# Patient Record
Sex: Male | Born: 1960 | Race: White | Hispanic: No | Marital: Single | Smoking: Former smoker
Health system: Southern US, Community
[De-identification: ages and names within clinical notes are randomized; demographics above are authoritative.]

## PROBLEM LIST (undated history)

## (undated) DIAGNOSIS — K7460 Unspecified cirrhosis of liver: Secondary | ICD-10-CM

## (undated) DIAGNOSIS — D696 Thrombocytopenia, unspecified: Secondary | ICD-10-CM

## (undated) DIAGNOSIS — F419 Anxiety disorder, unspecified: Secondary | ICD-10-CM

## (undated) DIAGNOSIS — J189 Pneumonia, unspecified organism: Secondary | ICD-10-CM

## (undated) DIAGNOSIS — B1920 Unspecified viral hepatitis C without hepatic coma: Secondary | ICD-10-CM

## (undated) DIAGNOSIS — K4090 Unilateral inguinal hernia, without obstruction or gangrene, not specified as recurrent: Secondary | ICD-10-CM

## (undated) DIAGNOSIS — F1020 Alcohol dependence, uncomplicated: Secondary | ICD-10-CM

## (undated) DIAGNOSIS — R188 Other ascites: Secondary | ICD-10-CM

## (undated) DIAGNOSIS — D689 Coagulation defect, unspecified: Secondary | ICD-10-CM

---

## 1978-09-03 HISTORY — PX: INCISION AND DRAINAGE PERIRECTAL ABSCESS: SHX1804

## 2001-11-06 ENCOUNTER — Emergency Department (HOSPITAL_COMMUNITY): Admission: EM | Admit: 2001-11-06 | Discharge: 2001-11-06 | Payer: Self-pay | Admitting: Emergency Medicine

## 2001-11-06 ENCOUNTER — Encounter: Payer: Self-pay | Admitting: Emergency Medicine

## 2003-01-07 ENCOUNTER — Emergency Department (HOSPITAL_COMMUNITY): Admission: EM | Admit: 2003-01-07 | Discharge: 2003-01-07 | Payer: Self-pay | Admitting: Emergency Medicine

## 2003-01-12 ENCOUNTER — Emergency Department (HOSPITAL_COMMUNITY): Admission: EM | Admit: 2003-01-12 | Discharge: 2003-01-12 | Payer: Self-pay | Admitting: Emergency Medicine

## 2003-02-24 ENCOUNTER — Emergency Department (HOSPITAL_COMMUNITY): Admission: EM | Admit: 2003-02-24 | Discharge: 2003-02-25 | Payer: Self-pay | Admitting: Emergency Medicine

## 2013-08-02 DIAGNOSIS — K7460 Unspecified cirrhosis of liver: Secondary | ICD-10-CM

## 2013-08-02 HISTORY — PX: INGUINAL HERNIA REPAIR: SUR1180

## 2013-08-02 HISTORY — DX: Unspecified cirrhosis of liver: K74.60

## 2013-08-28 ENCOUNTER — Encounter (HOSPITAL_COMMUNITY): Payer: Self-pay | Admitting: Emergency Medicine

## 2013-08-28 ENCOUNTER — Inpatient Hospital Stay (HOSPITAL_COMMUNITY): Payer: Medicaid - Out of State

## 2013-08-28 ENCOUNTER — Inpatient Hospital Stay (HOSPITAL_COMMUNITY)
Admission: EM | Admit: 2013-08-28 | Discharge: 2013-09-10 | DRG: 871 | Disposition: A | Discharge status: Home / Self Care | Payer: Medicaid - Out of State | Attending: Family Medicine | Admitting: Family Medicine

## 2013-08-28 DIAGNOSIS — Z91038 Other insect allergy status: Secondary | ICD-10-CM

## 2013-08-28 DIAGNOSIS — I519 Heart disease, unspecified: Secondary | ICD-10-CM | POA: Diagnosis present

## 2013-08-28 DIAGNOSIS — F1410 Cocaine abuse, uncomplicated: Secondary | ICD-10-CM | POA: Diagnosis present

## 2013-08-28 DIAGNOSIS — Z79899 Other long term (current) drug therapy: Secondary | ICD-10-CM | POA: Diagnosis not present

## 2013-08-28 DIAGNOSIS — R197 Diarrhea, unspecified: Secondary | ICD-10-CM | POA: Diagnosis present

## 2013-08-28 DIAGNOSIS — D684 Acquired coagulation factor deficiency: Secondary | ICD-10-CM | POA: Diagnosis present

## 2013-08-28 DIAGNOSIS — J90 Pleural effusion, not elsewhere classified: Secondary | ICD-10-CM | POA: Diagnosis present

## 2013-08-28 DIAGNOSIS — D62 Acute posthemorrhagic anemia: Secondary | ICD-10-CM | POA: Diagnosis present

## 2013-08-28 DIAGNOSIS — J9600 Acute respiratory failure, unspecified whether with hypoxia or hypercapnia: Secondary | ICD-10-CM | POA: Diagnosis present

## 2013-08-28 DIAGNOSIS — J189 Pneumonia, unspecified organism: Secondary | ICD-10-CM | POA: Diagnosis present

## 2013-08-28 DIAGNOSIS — A419 Sepsis, unspecified organism: Secondary | ICD-10-CM | POA: Diagnosis present

## 2013-08-28 DIAGNOSIS — L7632 Postprocedural hematoma of skin and subcutaneous tissue following other procedure: Secondary | ICD-10-CM

## 2013-08-28 DIAGNOSIS — K7030 Alcoholic cirrhosis of liver without ascites: Secondary | ICD-10-CM

## 2013-08-28 DIAGNOSIS — E872 Acidosis, unspecified: Secondary | ICD-10-CM | POA: Diagnosis present

## 2013-08-28 DIAGNOSIS — N5089 Other specified disorders of the male genital organs: Secondary | ICD-10-CM | POA: Diagnosis present

## 2013-08-28 DIAGNOSIS — E8779 Other fluid overload: Secondary | ICD-10-CM | POA: Diagnosis present

## 2013-08-28 DIAGNOSIS — E871 Hypo-osmolality and hyponatremia: Secondary | ICD-10-CM | POA: Diagnosis present

## 2013-08-28 DIAGNOSIS — IMO0002 Reserved for concepts with insufficient information to code with codable children: Secondary | ICD-10-CM | POA: Diagnosis present

## 2013-08-28 DIAGNOSIS — Y838 Other surgical procedures as the cause of abnormal reaction of the patient, or of later complication, without mention of misadventure at the time of the procedure: Secondary | ICD-10-CM | POA: Diagnosis present

## 2013-08-28 DIAGNOSIS — D6959 Other secondary thrombocytopenia: Secondary | ICD-10-CM | POA: Diagnosis present

## 2013-08-28 DIAGNOSIS — I498 Other specified cardiac arrhythmias: Secondary | ICD-10-CM | POA: Diagnosis not present

## 2013-08-28 DIAGNOSIS — K7290 Hepatic failure, unspecified without coma: Secondary | ICD-10-CM

## 2013-08-28 DIAGNOSIS — F10239 Alcohol dependence with withdrawal, unspecified: Secondary | ICD-10-CM | POA: Diagnosis present

## 2013-08-28 DIAGNOSIS — R6520 Severe sepsis without septic shock: Secondary | ICD-10-CM | POA: Diagnosis present

## 2013-08-28 DIAGNOSIS — E43 Unspecified severe protein-calorie malnutrition: Secondary | ICD-10-CM | POA: Diagnosis present

## 2013-08-28 DIAGNOSIS — F17200 Nicotine dependence, unspecified, uncomplicated: Secondary | ICD-10-CM | POA: Diagnosis present

## 2013-08-28 DIAGNOSIS — D689 Coagulation defect, unspecified: Secondary | ICD-10-CM | POA: Diagnosis present

## 2013-08-28 DIAGNOSIS — K7682 Hepatic encephalopathy: Secondary | ICD-10-CM | POA: Diagnosis present

## 2013-08-28 DIAGNOSIS — D72829 Elevated white blood cell count, unspecified: Secondary | ICD-10-CM | POA: Diagnosis present

## 2013-08-28 DIAGNOSIS — R64 Cachexia: Secondary | ICD-10-CM | POA: Diagnosis present

## 2013-08-28 DIAGNOSIS — Z6827 Body mass index (BMI) 27.0-27.9, adult: Secondary | ICD-10-CM | POA: Diagnosis not present

## 2013-08-28 DIAGNOSIS — F1020 Alcohol dependence, uncomplicated: Secondary | ICD-10-CM | POA: Diagnosis present

## 2013-08-28 DIAGNOSIS — R112 Nausea with vomiting, unspecified: Secondary | ICD-10-CM | POA: Diagnosis present

## 2013-08-28 DIAGNOSIS — B182 Chronic viral hepatitis C: Secondary | ICD-10-CM

## 2013-08-28 DIAGNOSIS — R188 Other ascites: Secondary | ICD-10-CM | POA: Diagnosis present

## 2013-08-28 DIAGNOSIS — T819XXA Unspecified complication of procedure, initial encounter: Secondary | ICD-10-CM | POA: Diagnosis present

## 2013-08-28 DIAGNOSIS — K7460 Unspecified cirrhosis of liver: Secondary | ICD-10-CM | POA: Diagnosis present

## 2013-08-28 DIAGNOSIS — F10939 Alcohol use, unspecified with withdrawal, unspecified: Secondary | ICD-10-CM | POA: Diagnosis present

## 2013-08-28 DIAGNOSIS — K7469 Other cirrhosis of liver: Secondary | ICD-10-CM

## 2013-08-28 DIAGNOSIS — D696 Thrombocytopenia, unspecified: Secondary | ICD-10-CM | POA: Diagnosis present

## 2013-08-28 DIAGNOSIS — T148XXA Other injury of unspecified body region, initial encounter: Secondary | ICD-10-CM | POA: Diagnosis present

## 2013-08-28 HISTORY — DX: Unilateral inguinal hernia, without obstruction or gangrene, not specified as recurrent: K40.90

## 2013-08-28 HISTORY — DX: Unspecified viral hepatitis C without hepatic coma: B19.20

## 2013-08-28 LAB — CBC WITH DIFFERENTIAL/PLATELET
BASOS ABS: 0 10*3/uL (ref 0.0–0.1)
BASOS PCT: 0 % (ref 0–1)
EOS ABS: 0 10*3/uL (ref 0.0–0.7)
Eosinophils Relative: 0 % (ref 0–5)
HEMATOCRIT: 30.7 % — AB (ref 39.0–52.0)
Hemoglobin: 10.7 g/dL — ABNORMAL LOW (ref 13.0–17.0)
LYMPHS PCT: 10 % — AB (ref 12–46)
Lymphs Abs: 2.3 10*3/uL (ref 0.7–4.0)
MCH: 34.2 pg — AB (ref 26.0–34.0)
MCHC: 34.9 g/dL (ref 30.0–36.0)
MCV: 98.1 fL (ref 78.0–100.0)
MONO ABS: 3 10*3/uL — AB (ref 0.1–1.0)
Monocytes Relative: 13 % — ABNORMAL HIGH (ref 3–12)
Neutro Abs: 18.3 10*3/uL — ABNORMAL HIGH (ref 1.7–7.7)
Neutrophils Relative %: 77 % (ref 43–77)
PLATELETS: 139 10*3/uL — AB (ref 150–400)
RBC: 3.13 MIL/uL — ABNORMAL LOW (ref 4.22–5.81)
RDW: 20.9 % — ABNORMAL HIGH (ref 11.5–15.5)
WBC: 23.7 10*3/uL — AB (ref 4.0–10.5)

## 2013-08-28 LAB — DIC (DISSEMINATED INTRAVASCULAR COAGULATION)PANEL
D-Dimer, Quant: 3.45 ug/mL-FEU — ABNORMAL HIGH (ref 0.00–0.48)
Fibrinogen: 189 mg/dL — ABNORMAL LOW (ref 204–475)
Platelets: 93 10*3/uL — ABNORMAL LOW (ref 150–400)
Smear Review: NONE SEEN

## 2013-08-28 LAB — TYPE AND SCREEN
ABO/RH(D): A POS
Antibody Screen: NEGATIVE

## 2013-08-28 LAB — COMPREHENSIVE METABOLIC PANEL
ALBUMIN: 2 g/dL — AB (ref 3.5–5.2)
ALT: 28 U/L (ref 0–53)
AST: 46 U/L — AB (ref 0–37)
Alkaline Phosphatase: 100 U/L (ref 39–117)
Anion gap: 17 — ABNORMAL HIGH (ref 5–15)
BUN: 14 mg/dL (ref 6–23)
CO2: 20 mEq/L (ref 19–32)
CREATININE: 0.76 mg/dL (ref 0.50–1.35)
Calcium: 7.4 mg/dL — ABNORMAL LOW (ref 8.4–10.5)
Chloride: 90 mEq/L — ABNORMAL LOW (ref 96–112)
GFR calc Af Amer: >90 mL/min (ref >90)
GFR calc non Af Amer: >90 mL/min (ref >90)
Glucose, Bld: 88 mg/dL (ref 70–99)
Potassium: 4 mEq/L (ref 3.7–5.3)
Sodium: 127 mEq/L — ABNORMAL LOW (ref 137–147)
TOTAL PROTEIN: 6.9 g/dL (ref 6.0–8.3)
Total Bilirubin: 8.6 mg/dL — ABNORMAL HIGH (ref 0.3–1.2)

## 2013-08-28 LAB — URINALYSIS, ROUTINE W REFLEX MICROSCOPIC
Glucose, UA: NEGATIVE mg/dL
Hgb urine dipstick: NEGATIVE
KETONES UR: NEGATIVE mg/dL
NITRITE: POSITIVE — AB
PH: 5.5 (ref 5.0–8.0)
Protein, ur: NEGATIVE mg/dL
SPECIFIC GRAVITY, URINE: 1.024 (ref 1.005–1.030)
UROBILINOGEN UA: 2 mg/dL — AB (ref 0.0–1.0)

## 2013-08-28 LAB — I-STAT TROPONIN, ED: Troponin i, poc: 0 ng/mL (ref 0.00–0.08)

## 2013-08-28 LAB — LIPASE, BLOOD: LIPASE: 26 U/L (ref 11–59)

## 2013-08-28 LAB — RAPID URINE DRUG SCREEN, HOSP PERFORMED
Amphetamines: NOT DETECTED
BARBITURATES: NOT DETECTED
Benzodiazepines: NOT DETECTED
Cocaine: POSITIVE — AB
Opiates: NOT DETECTED
TETRAHYDROCANNABINOL: NOT DETECTED

## 2013-08-28 LAB — I-STAT CG4 LACTIC ACID, ED: LACTIC ACID, VENOUS: 3.88 mmol/L — AB (ref 0.5–2.2)

## 2013-08-28 LAB — PRO B NATRIURETIC PEPTIDE: Pro B Natriuretic peptide (BNP): 739.8 pg/mL — ABNORMAL HIGH (ref 0–125)

## 2013-08-28 LAB — URINE MICROSCOPIC-ADD ON

## 2013-08-28 LAB — AMMONIA: Ammonia: 60 umol/L (ref 11–60)

## 2013-08-28 LAB — PHOSPHORUS: PHOSPHORUS: 3.1 mg/dL (ref 2.3–4.6)

## 2013-08-28 LAB — DIC (DISSEMINATED INTRAVASCULAR COAGULATION) PANEL
INR: 2.36 — ABNORMAL HIGH (ref 0.00–1.49)
PROTHROMBIN TIME: 25.8 s — AB (ref 11.6–15.2)
aPTT: 35 seconds (ref 24–37)

## 2013-08-28 LAB — ETHANOL: Alcohol, Ethyl (B): <11 mg/dL (ref 0–11)

## 2013-08-28 LAB — ABO/RH: ABO/RH(D): A POS

## 2013-08-28 LAB — MAGNESIUM: Magnesium: 1.7 mg/dL (ref 1.5–2.5)

## 2013-08-28 LAB — PROTIME-INR
INR: 2.16 — ABNORMAL HIGH (ref 0.00–1.49)
Prothrombin Time: 24.1 seconds — ABNORMAL HIGH (ref 11.6–15.2)

## 2013-08-28 LAB — APTT: aPTT: 35 seconds (ref 24–37)

## 2013-08-28 MED ORDER — OXYCODONE HCL 5 MG PO TABS
5.0000 mg | ORAL_TABLET | ORAL | Status: DC | PRN
  Administered 2013-08-29 – 2013-09-02 (×8): 5 mg via ORAL
  Filled 2013-08-28 (×8): qty 1

## 2013-08-28 MED ORDER — ALUM & MAG HYDROXIDE-SIMETH 200-200-20 MG/5ML PO SUSP
30.0000 mL | Freq: Four times a day (QID) | ORAL | Status: DC | PRN
  Administered 2013-08-30: 30 mL via ORAL
  Filled 2013-08-28: qty 30

## 2013-08-28 MED ORDER — ACETAMINOPHEN 325 MG PO TABS
650.0000 mg | ORAL_TABLET | Freq: Four times a day (QID) | ORAL | Status: DC | PRN
  Administered 2013-08-29: 650 mg via ORAL
  Filled 2013-08-28: qty 2

## 2013-08-28 MED ORDER — DOCUSATE SODIUM 100 MG PO CAPS
100.0000 mg | ORAL_CAPSULE | Freq: Two times a day (BID) | ORAL | Status: DC
  Administered 2013-08-29: 100 mg via ORAL
  Filled 2013-08-28 (×3): qty 1

## 2013-08-28 MED ORDER — PIPERACILLIN-TAZOBACTAM 3.375 G IVPB 30 MIN
3.3750 g | Freq: Once | INTRAVENOUS | Status: AC
  Administered 2013-08-28: 3.375 g via INTRAVENOUS
  Filled 2013-08-28: qty 50

## 2013-08-28 MED ORDER — LORAZEPAM 1 MG PO TABS
1.0000 mg | ORAL_TABLET | Freq: Four times a day (QID) | ORAL | Status: DC | PRN
  Administered 2013-08-29: 1 mg via ORAL
  Filled 2013-08-28: qty 1

## 2013-08-28 MED ORDER — VANCOMYCIN HCL IN DEXTROSE 1-5 GM/200ML-% IV SOLN
1000.0000 mg | Freq: Once | INTRAVENOUS | Status: AC
  Administered 2013-08-28: 1000 mg via INTRAVENOUS
  Filled 2013-08-28: qty 200

## 2013-08-28 MED ORDER — IOHEXOL 300 MG/ML  SOLN
100.0000 mL | Freq: Once | INTRAMUSCULAR | Status: AC | PRN
  Administered 2013-08-28: 100 mL via INTRAVENOUS

## 2013-08-28 MED ORDER — SODIUM CHLORIDE 0.9 % IV SOLN
1000.0000 mL | Freq: Once | INTRAVENOUS | Status: AC
  Administered 2013-08-28: 1000 mL via INTRAVENOUS

## 2013-08-28 MED ORDER — SENNA 8.6 MG PO TABS
1.0000 | ORAL_TABLET | Freq: Two times a day (BID) | ORAL | Status: DC
  Administered 2013-08-29: 8.6 mg via ORAL
  Filled 2013-08-28: qty 1

## 2013-08-28 MED ORDER — PIPERACILLIN-TAZOBACTAM 4.5 G IVPB: 4.5000 g | Freq: Once | INTRAVENOUS | Status: DC

## 2013-08-28 MED ORDER — SODIUM CHLORIDE 0.9 % IV SOLN: 10.0000 mL/h | Freq: Once | INTRAVENOUS | Status: DC

## 2013-08-28 MED ORDER — MORPHINE SULFATE 4 MG/ML IJ SOLN
4.0000 mg | Freq: Once | INTRAMUSCULAR | Status: AC
  Administered 2013-08-28: 4 mg via INTRAVENOUS
  Filled 2013-08-28: qty 1

## 2013-08-28 MED ORDER — LORAZEPAM 2 MG/ML IJ SOLN
0.0000 mg | Freq: Four times a day (QID) | INTRAMUSCULAR | Status: DC
  Administered 2013-08-29 – 2013-08-30 (×5): 1 mg via INTRAVENOUS
  Filled 2013-08-28 (×4): qty 1

## 2013-08-28 MED ORDER — ACETAMINOPHEN 650 MG RE SUPP: 650.0000 mg | Freq: Four times a day (QID) | RECTAL | Status: DC | PRN

## 2013-08-28 MED ORDER — VITAMIN K1 10 MG/ML IJ SOLN
2.0000 mg | Freq: Once | INTRAVENOUS | Status: AC
  Administered 2013-08-28: 2 mg via INTRAVENOUS
  Filled 2013-08-28: qty 0.2

## 2013-08-28 MED ORDER — SODIUM CHLORIDE 0.9 % IV SOLN
INTRAVENOUS | Status: DC
  Administered 2013-08-28 (×2): via INTRAVENOUS

## 2013-08-28 MED ORDER — VITAMIN K1 10 MG/ML IJ SOLN: 2.0000 mg | Freq: Once | INTRAVENOUS | Status: DC

## 2013-08-28 MED ORDER — SODIUM CHLORIDE 0.9 % IV SOLN: Freq: Once | INTRAVENOUS | Status: DC

## 2013-08-28 MED ORDER — FOLIC ACID 1 MG PO TABS
1.0000 mg | ORAL_TABLET | Freq: Every day | ORAL | Status: DC
  Administered 2013-08-29 – 2013-09-10 (×13): 1 mg via ORAL
  Filled 2013-08-28 (×14): qty 1

## 2013-08-28 MED ORDER — THIAMINE HCL 100 MG/ML IJ SOLN
100.0000 mg | Freq: Every day | INTRAMUSCULAR | Status: DC
  Administered 2013-08-28: 100 mg via INTRAVENOUS
  Filled 2013-08-28 (×8): qty 1

## 2013-08-28 MED ORDER — ONDANSETRON HCL 4 MG/2ML IJ SOLN
4.0000 mg | Freq: Once | INTRAMUSCULAR | Status: AC
  Administered 2013-08-28: 4 mg via INTRAVENOUS
  Filled 2013-08-28: qty 2

## 2013-08-28 MED ORDER — ONDANSETRON HCL 4 MG PO TABS: 4.0000 mg | ORAL_TABLET | Freq: Four times a day (QID) | ORAL | Status: DC | PRN

## 2013-08-28 MED ORDER — SODIUM CHLORIDE 0.9 % IV SOLN
INTRAVENOUS | Status: DC
  Administered 2013-08-29: 11:00:00 via INTRAVENOUS

## 2013-08-28 MED ORDER — LORAZEPAM 2 MG/ML IJ SOLN: 0.0000 mg | Freq: Two times a day (BID) | INTRAMUSCULAR | Status: DC

## 2013-08-28 MED ORDER — LORAZEPAM 2 MG/ML IJ SOLN
1.0000 mg | Freq: Four times a day (QID) | INTRAMUSCULAR | Status: DC | PRN
  Administered 2013-08-28 – 2013-08-30 (×2): 1 mg via INTRAVENOUS
  Filled 2013-08-28 (×2): qty 1

## 2013-08-28 MED ORDER — IOHEXOL 300 MG/ML  SOLN
50.0000 mL | Freq: Once | INTRAMUSCULAR | Status: AC | PRN
  Administered 2013-08-28: 50 mL via ORAL

## 2013-08-28 MED ORDER — VITAMIN B-1 100 MG PO TABS
100.0000 mg | ORAL_TABLET | Freq: Every day | ORAL | Status: DC
  Administered 2013-08-29 – 2013-09-10 (×13): 100 mg via ORAL
  Filled 2013-08-28 (×15): qty 1

## 2013-08-28 MED ORDER — ADULT MULTIVITAMIN W/MINERALS CH
1.0000 | ORAL_TABLET | Freq: Every day | ORAL | Status: DC
  Administered 2013-08-29 – 2013-09-10 (×13): 1 via ORAL
  Filled 2013-08-28 (×14): qty 1

## 2013-08-28 MED ORDER — ONDANSETRON HCL 4 MG/2ML IJ SOLN
4.0000 mg | Freq: Four times a day (QID) | INTRAMUSCULAR | Status: DC | PRN
  Administered 2013-08-30: 4 mg via INTRAVENOUS
  Filled 2013-08-28: qty 2

## 2013-08-28 NOTE — ED Notes (Signed)
MD at bedside.  General Surgery

## 2013-08-28 NOTE — ED Notes (Signed)
Pt states that he had an inguinal surgery 1 wk ago in [place].  Pt drove all the way down here by himself. Black and blue bruising noted from mid abd to mid thigh.  Severe swelling noted to groin.  Pt is also jaundiced.  Reports hx of alcoholism.

## 2013-08-28 NOTE — Progress Notes (Signed)
Patient transferred to 1402. Report given to nurse Dawn.

## 2013-08-28 NOTE — Progress Notes (Signed)
Pt arrived from ED after report received from RN. Pt alert and oriented, attempted to contact admitting physician x2 without a callback. PT has telemetry order and admitted to a non-tele unit. Will continue to call MD to clarify orders.

## 2013-08-28 NOTE — H&P (Addendum)
[facility] History and Physical  Xavier Valentine XBJ:[id number] DOB: 1960-09-02 DOA: 08/28/2013   PCP: No PCP Per Patient    Chief Complaint: bleeding from hernia incision  HPI: Xavier Valentine is a 53 y.o. male with past medical h/o of ETOH abuse and Hep C who underwent a right inguinal hernia repair in [place] 1 wk ago. He was doing well post op until 2 days ago when he states the wound began to bleed. It has continued to bleed for these 2 days and he therefore decided to come to the ER. He has no c/o dizziness lightheaded sensation with standing. He is noted to have a significantly elevated bilirubin and an elevated INR.  I do not have records to see what his INR was pre-op but he states that his pre-op lab work was unremarkable.   General: The patient denies anorexia, fever, + weight loss of about 20 lbs since Feb of this year Cardiac: Denies chest pain, syncope, palpitations, + pedal edema  Respiratory: Denies cough, shortness of breath, wheezing GI: Denies severe indigestion/heartburn, abdominal pain, nausea, vomiting, diarrhea and constipation GU: Denies hematuria, incontinence, dysuria  Musculoskeletal: Denies arthritis  Skin: Denies suspicious skin lesions Neurologic: Denies focal weakness or numbness, change in vision  Past Medical History  Diagnosis Date  . Alcoholism   . Inguinal hernia   . Hepatitis C   . Perirectal abscess     Past Surgical History  Procedure Laterality Date  . Hernia repair      Social History: no h/o smoking, has been drinking 2-3 drinks of vodka daily, lives up in [place] but now living with his family in [place]    Allergies  Allergen Reactions  . Bee Venom Swelling    History reviewed. No pertinent family history.    Prior to Admission medications   Medication Sig Start Date End Date Taking? Authorizing Provider  Docusate Calcium (STOOL SOFTENER PO) Take 1 tablet by mouth 2 (two) times daily as needed (constipation.).    Yes Historical Provider, MD  oxycodone (OXY-IR) 5 MG capsule Take 5 mg by mouth every 4 (four) hours as needed for pain.   Yes Historical Provider, MD     Physical Exam: Filed Vitals:   08/28/13 1500 08/28/13 1515 08/28/13 1530 08/28/13 1545  BP: 122/70 119/67 119/71 121/64  Pulse: 85 87 88 88  Temp:      TempSrc:      Resp: SpO2: 93% 92% 94% 92%     General: AAO x 3, no distress HEENT: Normocephalic and Atraumatic, Mucous membranes pink                PERRLA; EOM intact; significant scleral icterus,                 Nares: Patent, Oropharynx:                 Neck: FROM, no cervical lymphadenopathy, thyromegaly, carotid bruit or JVD;  Breasts: deferred CHEST WALL: No tenderness  CHEST: Normal respiration, clear to auscultation bilaterally  HEART: Regular rate and rhythm; no murmurs rubs or gallops  BACK: No kyphosis or scoliosis; no CVA tenderness  ABDOMEN: Positive Bowel Sounds, soft, non-tender; moderate distension-  no masses, no organomegaly Rectal Exam: deferred EXTREMITIES: No cyanosis, clubbing, or edema Genitalia: not examined  SKIN:  no rash or ulceration - severe bruising of abdominal wall and right leg, oozing of blood from right inguinal incision with significant swelling in area  surrounding the incision,  Lower abdominal;/pelvis area and scrotum. Staples intact.  CNS: Alert and Oriented x 4, Nonfocal exam, CN 2-12 intact  Labs on Admission:  Basic Metabolic Panel:  Recent Labs Lab 08/28/13 1357  NA 127*  K 4.0  CL 90*  CO2 20  GLUCOSE 88  BUN 14  CREATININE 0.76  CALCIUM 7.4*  MG 1.7  PHOS 3.1   Liver Function Tests:  Recent Labs Lab 08/28/13 1357  AST 46*  ALT 28  ALKPHOS 100  BILITOT 8.6*  PROT 6.9  ALBUMIN 2.0*    Recent Labs Lab 08/28/13 1357  LIPASE 26    Recent Labs Lab 08/28/13 1357  AMMONIA 60   CBC:  Recent Labs Lab 08/28/13 1357  WBC 23.7*  NEUTROABS 18.3*  HGB 10.7*  HCT 30.7*  MCV 98.1  PLT 139*    Cardiac Enzymes: No results found for this basename: CKTOTAL, CKMB, CKMBINDEX, TROPONINI,  in the last 168 hours  BNP (last 3 results)  Recent Labs  08/28/13 1357  PROBNP 739.8*   CBG: No results found for this basename: GLUCAP,  in the last 168 hours  Radiological Exams on Admission: No results found.  EKG: Independently reviewed. Sinus rhythm with Qtc of 540 msec  Assessment/Plan Principal Problem:  Right inguinal and abdominal Hematoma / Post-operative complication -coagulopathy due to cirrhosis-  given Vit K in ER- will give a dose of Cryoprecipitate and order a DIC panel (discussed with hematology) - per hematology, if one dose of Cryo is ineffective, no need to give further doses.  - difficult to tell if there is a related infection - WBC count - specifically, neutrophils are elevated but no pus seen draining from the wound and no fevers- has been given Vanc and Zosyn in the ER - further management per surgery  Active Problems:  Cirrhosis of liver- Hep C and Alcohol induce  mild thrombocytopenia  possible ascites - with significantly elevated Bilirubin - obviously he needs to stop drinking alcohol and he understands this  Leukocytosis - stress response vs infected hematoma- also appears to have a UTI  - blood cx x 2 and urine culture obtained - cont Vanc and Zosyn   ETOH abuse - follow for withdrawal with CIWA scale.   Hypoalbuminemia - likely due to cirrhosis  Hyponatremia - check urine sodium and osmolality - possibly is fluid overloaded- exam suggestive of ascites - however, in setting of bleeding and elevated Lactic acid which is suggestive of poor perfusion of tissues,  IVF have been started by the ER -  I will at least continue this overnight- follow Bmet in AM   Cocaine positive -avoid B blockers- discussed health complications of Cocaine use with patient.     Consulted: srugery  Code Status: Full code but does not want to be kept alive if he is  a "vegetable"  Family Communication: none  DVT Prophylaxis:SCDs  Time spent: >45 in  [name], MD [facility]  If 7PM-7AM, please contact night-coverage [email or web address] 08/28/2013, 4:35 PM

## 2013-08-28 NOTE — Progress Notes (Signed)
No call from admitting MD, unit charge RN notified, states will call and notify [facility]. Will continue to monitor.

## 2013-08-28 NOTE — Consult Note (Signed)
General Surgery Attending:  I have interviewed and examined this patient. I have discussed his care with the ED physician. I agree with the assessment and treatment plan outlined by Mr. Marlyne Beards PA.  Assessment/plan Wound and scrotal hematoma. One week postop right inguinal hernia repair by history. Details unknown Clinically there is no evidence of infection, but given the leukocytosis I agree with antibiotics.  There is no indication to open the wound at this point in time. In fact, relatively contraindicated. Ice pack CT scan abdomen pelvis and scrotum to make sure this is nothing more than a hematoma. It doesn't look like a recurrence and there is no sign of intestinal obstruction.  Coagulopathy secondary to cirrhosis. INR 2.16. T.Bili 8.6 This has obviously contributed to his wound hemorrhage. We'll give FFP and cryoprecipitate and vitamin K. We will see if this is effective.  Associated hypoalbuminemia, anticipate poor wound healing.  Hepatitis C and cirrhosis by history  Alcohol abuse, active. Risk for withdrawal syndrome  Cocaine use, drug screen positive  Mild lactic acidosis. ?dehydrated a bit.  Elevated BNP   We will follow.  Angelia Mould. Derrell Lolling, M.D., [facility] General and Minimally invasive Surgery Breast and Colorectal Surgery Office:   [phone]

## 2013-08-28 NOTE — ED Notes (Signed)
Dr Donnald Garre aware of elevated I stat Lactic.

## 2013-08-28 NOTE — Consult Note (Signed)
Reason for Consult:  Post op inguinal hernia repair with bleeding, ecchymosis and large hematoma. Referring Physician: Dr. Dorthula Nettles  Xavier Valentine is an 53 y.o. male.  HPI: 53 y/o male who underwent a right inguinal hernia repair at [facility] in [place] Dr. Tresa Garter on 08/21/13.  This was done as an outpatient and he is actually suppose to go back tomorrow for staple removal.  Unfortunately he was about to be homeless so he drove to [place] to live with family and friends.  He reports increasing swelling and ecchymosis since his surgery.  He say a friend brought him to the ED here today and he is living with a sister.  Work up in the ED shows ecchymosis from his abdomen below the costal margin, down thru his perineum, his scrotum, both thighs and legs to below the knees .  He has a stapled inguinal hernia repair draining old blood/hematoma.  He report finding out he has hepatitis C during the work up of his hernia repair.  He drinks ETOH and has recently used cocaine.  He does not know how he got Hepatitis C. Labs show a Na of 127, albumin of 2, bilirubin of 8.6, troponin 0.0, BNP 739.8 Lactic acid of 3.88, WBC of 23.7, H/H 10.7/30.7.  Platelets 139K, INR 2.16.  Probable UTI.  Drug screen positive for cocaine.   We are ask to see to address wound issues.  Past Medical History  Diagnosis Date   Cocaine use   . Alcoholism/ongoing use   . Inguinal hernia   . Hepatitis C   . Perirectal abscess     Past Surgical History  Procedure Laterality Date  . Hernia repair 08/21/13 [place], [place]    History reviewed. No pertinent family history.  Social History:  reports that he has been smoking.  He has never used smokeless tobacco. He reports that he drinks alcohol. He reports that he does not use illicit drugs. Tobacco:  Occasional Drugs:  Cocaine (last week) ETOH:  Heavy use in the past, has had a couple drinks today. Allergies:  Allergies  Allergen Reactions  . Bee Venom  Swelling    Medications:  Prior to Admission:  (Not in a hospital admission) Scheduled:  Continuous: . sodium chloride 100 mL/hr at 08/28/13 1403  . sodium chloride    . sodium chloride    . sodium chloride    . phytonadione (VITAMIN K) IV 2 mg (08/28/13 1613)   PRN: Anti-infectives   Start     Dose/Rate Route Frequency Ordered Stop   08/28/13 1400  piperacillin-tazobactam (ZOSYN) IVPB 4.5 g  Status:  Discontinued     4.5 g 200 mL/hr over 30 Minutes Intravenous  Once 08/28/13 1352 08/28/13 1356   08/28/13 1400  vancomycin (VANCOCIN) IVPB 1000 mg/200 mL premix     1,000 mg 200 mL/hr over 60 Minutes Intravenous  Once 08/28/13 1352 08/28/13 1548   08/28/13 1400  piperacillin-tazobactam (ZOSYN) IVPB 3.375 g     3.375 g 100 mL/hr over 30 Minutes Intravenous  Once 08/28/13 1357 08/28/13 1547      Results for orders placed during the hospital encounter of 08/28/13 (from the past 48 hour(s))  TYPE AND SCREEN     Status: None   Collection Time    08/28/13  1:53 PM      Result Value Ref Range   ABO/RH(D) A POS     Antibody Screen PENDING     Sample Expiration 08/31/2013    COMPREHENSIVE  METABOLIC PANEL     Status: Abnormal   Collection Time    08/28/13  1:57 PM      Result Value Ref Range   Sodium 127 (*) 137 - 147 mEq/L   Potassium 4.0  3.7 - 5.3 mEq/L   Chloride 90 (*) 96 - 112 mEq/L   CO2 20  19 - 32 mEq/L   Glucose, Bld 88  70 - 99 mg/dL   BUN 14  6 - 23 mg/dL   Creatinine, Ser 0.76  0.50 - 1.35 mg/dL   Calcium 7.4 (*) 8.4 - 10.5 mg/dL   Total Protein 6.9  6.0 - 8.3 g/dL   Albumin 2.0 (*) 3.5 - 5.2 g/dL   AST 46 (*) 0 - 37 U/L   ALT 28  0 - 53 U/L   Alkaline Phosphatase 100  39 - 117 U/L   Total Bilirubin 8.6 (*) 0.3 - 1.2 mg/dL   GFR calc non Af Amer >90  >90 mL/min   GFR calc Af Amer >90  >90 mL/min   Comment: (NOTE)     The eGFR has been calculated using the CKD EPI equation.     This calculation has not been validated in all clinical situations.     eGFR's  persistently <90 mL/min signify possible Chronic Kidney     Disease.   Anion gap 17 (*) 5 - 15  LIPASE, BLOOD     Status: None   Collection Time    08/28/13  1:57 PM      Result Value Ref Range   Lipase 26  11 - 59 U/L  PRO B NATRIURETIC PEPTIDE     Status: Abnormal   Collection Time    08/28/13  1:57 PM      Result Value Ref Range   Pro B Natriuretic peptide (BNP) 739.8 (*) 0 - 125 pg/mL  APTT     Status: None   Collection Time    08/28/13  1:57 PM      Result Value Ref Range   aPTT 35  24 - 37 seconds  PROTIME-INR     Status: Abnormal   Collection Time    08/28/13  1:57 PM      Result Value Ref Range   Prothrombin Time 24.1 (*) 11.6 - 15.2 seconds   INR 2.16 (*) 0.00 - 1.49  PHOSPHORUS     Status: None   Collection Time    08/28/13  1:57 PM      Result Value Ref Range   Phosphorus 3.1  2.3 - 4.6 mg/dL  MAGNESIUM     Status: None   Collection Time    08/28/13  1:57 PM      Result Value Ref Range   Magnesium 1.7  1.5 - 2.5 mg/dL  AMMONIA     Status: None   Collection Time    08/28/13  1:57 PM      Result Value Ref Range   Ammonia 60  11 - 60 umol/L  ETHANOL     Status: None   Collection Time    08/28/13  1:57 PM      Result Value Ref Range   Alcohol, Ethyl (B) <11  0 - 11 mg/dL   Comment:            LOWEST DETECTABLE LIMIT FOR     SERUM ALCOHOL IS 11 mg/dL     FOR MEDICAL PURPOSES ONLY  CBC WITH DIFFERENTIAL     Status: Abnormal  Collection Time    08/28/13  1:57 PM      Result Value Ref Range   WBC 23.7 (*) 4.0 - 10.5 K/uL   RBC 3.13 (*) 4.22 - 5.81 MIL/uL   Hemoglobin 10.7 (*) 13.0 - 17.0 g/dL   HCT 30.7 (*) 39.0 - 52.0 %   MCV 98.1  78.0 - 100.0 fL   MCH 34.2 (*) 26.0 - 34.0 pg   MCHC 34.9  30.0 - 36.0 g/dL   RDW 20.9 (*) 11.5 - 15.5 %   Platelets 139 (*) 150 - 400 K/uL   Neutrophils Relative % 77  43 - 77 %   Neutro Abs 18.3 (*) 1.7 - 7.7 K/uL   Lymphocytes Relative 10 (*) 12 - 46 %   Lymphs Abs 2.3  0.7 - 4.0 K/uL   Monocytes Relative 13 (*) 3 -  12 %   Monocytes Absolute 3.0 (*) 0.1 - 1.0 K/uL   Eosinophils Relative 0  0 - 5 %   Eosinophils Absolute 0.0  0.0 - 0.7 K/uL   Basophils Relative 0  0 - 1 %   Basophils Absolute 0.0  0.0 - 0.1 K/uL  I-STAT TROPOININ, ED     Status: None   Collection Time    08/28/13  2:26 PM      Result Value Ref Range   Troponin i, poc 0.00  0.00 - 0.08 ng/mL   Comment 3            Comment: Due to the release kinetics of cTnI,     a negative result within the first hours     of the onset of symptoms does not rule out     myocardial infarction with certainty.     If myocardial infarction is still suspected,     repeat the test at appropriate intervals.  I-STAT CG4 LACTIC ACID, ED     Status: Abnormal   Collection Time    08/28/13  2:27 PM      Result Value Ref Range   Lactic Acid, Venous 3.88 (*) 0.5 - 2.2 mmol/L  URINE RAPID DRUG SCREEN (HOSP PERFORMED)     Status: Abnormal   Collection Time    08/28/13  3:41 PM      Result Value Ref Range   Opiates NONE DETECTED  NONE DETECTED   Cocaine POSITIVE (*) NONE DETECTED   Benzodiazepines NONE DETECTED  NONE DETECTED   Amphetamines NONE DETECTED  NONE DETECTED   Tetrahydrocannabinol NONE DETECTED  NONE DETECTED   Barbiturates NONE DETECTED  NONE DETECTED   Comment:            DRUG SCREEN FOR MEDICAL PURPOSES     ONLY.  IF CONFIRMATION IS NEEDED     FOR ANY PURPOSE, NOTIFY LAB     WITHIN 5 DAYS.                LOWEST DETECTABLE LIMITS     FOR URINE DRUG SCREEN     Drug Class       Cutoff (ng/mL)     Amphetamine      1000     Barbiturate      200     Benzodiazepine   462     Tricyclics       863     Opiates          300     Cocaine          300  THC              50    No results found.  Review of Systems  Constitutional: Negative.   Eyes: Negative.   Respiratory: Negative.   Cardiovascular: Positive for leg swelling (since surgery).  Gastrointestinal: Positive for constipation (since surgery 08/21/13). Negative for heartburn,  nausea, vomiting, abdominal pain and diarrhea.  Genitourinary: Negative.        Urine is very dark  Musculoskeletal: Negative.   Skin:       He started having ecchymosis day after the surgery.  See picture below  Neurological: Positive for dizziness and headaches. Negative for tingling, tremors, speech change, focal weakness, seizures and loss of consciousness.  Endo/Heme/Allergies: Bruises/bleeds easily.  Psychiatric/Behavioral: The patient is nervous/anxious.    Blood pressure 121/64, pulse 88, temperature 98.5 F (36.9 C), temperature source Oral, resp. rate 27, SpO2 92.00%. Physical Exam  Constitutional: He is oriented to person, place, and time. No distress.  Jaundice appearing WM, in some discomfort, with blood all over lower abdomen  HENT:  Head: Normocephalic and atraumatic.  Nose: Nose normal.  Eyes:  Both pupils small and equal he just had pain medicine  Neck: Normal range of motion. Neck supple. No JVD present. No tracheal deviation present. No thyromegaly present.  Cardiovascular: Regular rhythm and intact distal pulses.   Murmur (soft systolic mumur) heard. Slightly tachycardic  Respiratory: Effort normal and breath sounds normal. No respiratory distress. He has no wheezes. He has no rales. He exhibits no tenderness.  GI: Soft. Bowel sounds are normal. He exhibits no distension and no mass. There is no tenderness. There is no rebound and no guarding.  He has significant ecchymosis lower abdomen, perineium, both thighs and down both legs below the knee going into the calves. He has a right groin incision that is stapled together and when pressed is draining old hematoma.  No purulent drainage.  Genitourinary:  Some swelling of the penis perineium is swollen and ecchymotic. The scrotum is slightly larger than a coconut, at least 40 cm in diameter.  Musculoskeletal: He exhibits no edema and no tenderness.  Lymphadenopathy:    He has no cervical adenopathy.  Neurological:  He is alert and oriented to person, place, and time. No cranial nerve deficit.  Skin: He is not diaphoretic.  See picture below  Psychiatric: He has a normal mood and affect. His behavior is normal. Judgment and thought content normal.  Very talkative, with explanations for everything.       Assessment/Plan: 1.  Hepatitis C with probable cirrhosis 2.  Coagulopathy secondary to Cirrhosis 3.  Significant post op bleed into the abdomen, perineum, scrotum and lower legs' after right  inguinal hernia repair. Large hematoma right inguinal hernia repair site. 4.  Possible UTI 5.  Alcoholism/ongoing use 6.  Cocaine use/durg screen positive  Plan:  He has been seen and examined by Dr. Dalbert Batman.  It is his opinion we should start some antibiotics and agrees with vancomycin and Zosyn.  CT of the abdomen and pelvis to include the scrotum with contrast.  He has already received Vitamin K and recommends FFP to help correct the INR.  Ice to the scrotum/ along with support.  We will follow with you  Xavier Valentine 08/28/2013, 4:11 PM

## 2013-08-28 NOTE — ED Provider Notes (Signed)
CSN: [id number]     Arrival date & time 08/28/13  1321 History   First MD Initiated Contact with Patient 08/28/13 1330     Chief Complaint  Patient presents with  . Post-op Problem     (Consider location/radiation/quality/duration/timing/severity/associated sxs/prior Treatment) HPI Is a 53 year old male new to this area. Patient had a surgery done in [place] 7 days ago that was an inguinal hernia repair the patient reports that the hernia he had at that point size was approximately like a coconut in his testicle. Within 2 days of the surgery he had developed extensive bruising around the area. At this point in time that has extended and has a significant amount of bruising that extends from his mid abdomen down to his lower thighs. The patient reports his primary concern is that he has noticed significant swelling redness and foul smelling discharge from the wound site. As well he has developed significant swelling in the scrotal sac and the penis. Patient reports he has had some chills however no documented fever that is where it he does have significant pain and discomfort in association with this. His medical history is significant for a treated hepatitis C and history of significant alcoholism with likely associated cirrhosis. Patient denies any take any regular medications. Denies history of diabetes or other known medical problems.  History reviewed. No pertinent past medical history. History reviewed. No pertinent past surgical history. History reviewed. No pertinent family history. History  Substance Use Topics  . Smoking status: Not on file  . Smokeless tobacco: Not on file  . Alcohol Use: Not on file   patient does describe himself as a chronic alcoholic. He reports he has not been drinking much lately due to his medical conditions. He reports he can quit when he wants to and does not have issues with withdrawal.  Patient reports a history of intermittent tobacco use but apparently  not chronic.  The patient has been living in [place] for a number of years apparently and had a surgery done there as described however is coming back to this area reportedly to be near family as he is recuperating.  Review of Systems  Constitutional: Positive for chills and fatigue. Negative for fever.  HENT: Negative for congestion and sore throat.   Eyes: Negative.   Respiratory: Negative for cough and shortness of breath.   Cardiovascular: Negative for chest pain and leg swelling.  Gastrointestinal: Positive for nausea and abdominal pain. Negative for vomiting and diarrhea.  Genitourinary: Positive for penile swelling, scrotal swelling, difficulty urinating, penile pain and testicular pain. Negative for dysuria.  Musculoskeletal: Positive for myalgias. Negative for arthralgias.  Skin: Positive for color change and wound. Negative for rash.  Neurological: Negative for dizziness, weakness and headaches.  Hematological: Bruises/bleeds easily.  Psychiatric/Behavioral: Negative for confusion and agitation.      Allergies  Review of patient's allergies indicates not on file.  Home Medications   Prior to Admission medications   Not on File   BP 135/89  Pulse 94  Temp(Src) 98.5 F (36.9 C) (Oral)  Resp 18  SpO2 100% Physical Exam The patient is alert and ambulatory into the emergency department his general color is poor being significantly jaundiced. He does appear to have cachexia. He is however nontoxic with clear mental status Head; the patient is normocephalic, atraumatic Eyes: Icteric sclera extraocular motions intact PERRL Mouth: Membranes are slightly dry in appearance tongue appears to have some white plaque  Neck: Supple Lungs: patient has adequate air  flow bilaterally, diminished at the left base. Mild Rales at the bases. No associated respiratory distress. Heart: Mild tachycardia heart sounds are regular Abdomen: Abdomen is distended soft. Patient has deep  ecchymoses from the mid upper abdomen down throughout the entirety of the groin and to the upper thighs. The groin shows a fresh surgical incision in the inguinal area on the right. Staples are in place there is a significant amount of erythema and swelling in this region. The scrotum is significantly distended to approximately the size of a very large grapefruit the head of the penis is ecchymotic there is no phimosis or paraphimosis present. Lower extremities: Aside from the ecchymosis present to the lower joints are otherwise normal condition without peripheral edema. Skin: Skin is warm dry and moderately jaundiced Neurologic: Patient is alert and oriented x3 interactive appropriately follows all commands can ambulate.    ED Course  Procedures (including critical care time) Labs Review Labs Reviewed  COMPREHENSIVE METABOLIC PANEL - Abnormal; Notable for the following:    Sodium 127 (*)    Chloride 90 (*)    Calcium 7.4 (*)    Albumin 2.0 (*)    AST 46 (*)    Total Bilirubin 8.6 (*)    Anion gap 17 (*)    All other components within normal limits  PRO B NATRIURETIC PEPTIDE - Abnormal; Notable for the following:    Pro B Natriuretic peptide (BNP) 739.8 (*)    All other components within normal limits  PROTIME-INR - Abnormal; Notable for the following:    Prothrombin Time 24.1 (*)    INR 2.16 (*)    All other components within normal limits  CBC WITH DIFFERENTIAL - Abnormal; Notable for the following:    WBC 23.7 (*)    RBC 3.13 (*)    Hemoglobin 10.7 (*)    HCT 30.7 (*)    MCH 34.2 (*)    RDW 20.9 (*)    Platelets 139 (*)    Neutro Abs 18.3 (*)    Lymphocytes Relative 10 (*)    Monocytes Relative 13 (*)    Monocytes Absolute 3.0 (*)    All other components within normal limits  I-STAT CG4 LACTIC ACID, ED - Abnormal; Notable for the following:    Lactic Acid, Venous 3.88 (*)    All other components within normal limits  URINE CULTURE  CULTURE, BLOOD (ROUTINE X 2)   CULTURE, BLOOD (ROUTINE X 2)  LIPASE, BLOOD  APTT  PHOSPHORUS  MAGNESIUM  AMMONIA  ETHANOL  URINALYSIS, ROUTINE W REFLEX MICROSCOPIC  URINE RAPID DRUG SCREEN (HOSP PERFORMED)  I-STAT TROPOININ, ED  TYPE AND SCREEN  ABO/RH  PREPARE FRESH FROZEN PLASMA    Imaging Review No results found.   EKG Interpretation None     CONSULT:INGRAM advised to initiated FFP 2units and Vit K  IV. Consult hospitalist for medical management. Decision for admitting service will be determined by surgery and medicine.  INGRAM to the emergency department and assessed the patient he determined at that point time that admission was appropriate to the hospitalist service. MDM   Final diagnoses:  Alcoholic cirrhosis of liver without ascites  Chronic hepatitis C without hepatic coma  Postoperative hematoma of skin following non-dermatologic procedure   Patient is admitted for further treatment with antibiotics and observation.    Arby Barrette, MD 08/28/13 [id number]

## 2013-08-29 DIAGNOSIS — D62 Acute posthemorrhagic anemia: Secondary | ICD-10-CM

## 2013-08-29 DIAGNOSIS — J189 Pneumonia, unspecified organism: Secondary | ICD-10-CM | POA: Diagnosis present

## 2013-08-29 DIAGNOSIS — E871 Hypo-osmolality and hyponatremia: Secondary | ICD-10-CM | POA: Diagnosis present

## 2013-08-29 DIAGNOSIS — A419 Sepsis, unspecified organism: Secondary | ICD-10-CM | POA: Diagnosis present

## 2013-08-29 DIAGNOSIS — K7030 Alcoholic cirrhosis of liver without ascites: Secondary | ICD-10-CM

## 2013-08-29 DIAGNOSIS — D72829 Elevated white blood cell count, unspecified: Secondary | ICD-10-CM | POA: Diagnosis present

## 2013-08-29 DIAGNOSIS — R6520 Severe sepsis without septic shock: Secondary | ICD-10-CM | POA: Diagnosis present

## 2013-08-29 DIAGNOSIS — K7290 Hepatic failure, unspecified without coma: Secondary | ICD-10-CM | POA: Diagnosis present

## 2013-08-29 DIAGNOSIS — D696 Thrombocytopenia, unspecified: Secondary | ICD-10-CM | POA: Diagnosis present

## 2013-08-29 DIAGNOSIS — K7682 Hepatic encephalopathy: Secondary | ICD-10-CM | POA: Diagnosis present

## 2013-08-29 LAB — COMPREHENSIVE METABOLIC PANEL
ALBUMIN: 1.8 g/dL — AB (ref 3.5–5.2)
ALT: 21 U/L (ref 0–53)
ANION GAP: 10 (ref 5–15)
AST: 33 U/L (ref 0–37)
Alkaline Phosphatase: 77 U/L (ref 39–117)
BUN: 16 mg/dL (ref 6–23)
CO2: 22 mEq/L (ref 19–32)
CREATININE: 0.77 mg/dL (ref 0.50–1.35)
Calcium: 6.8 mg/dL — ABNORMAL LOW (ref 8.4–10.5)
Chloride: 95 mEq/L — ABNORMAL LOW (ref 96–112)
GFR calc non Af Amer: >90 mL/min (ref >90)
Glucose, Bld: 95 mg/dL (ref 70–99)
Potassium: 3.9 mEq/L (ref 3.7–5.3)
Sodium: 127 mEq/L — ABNORMAL LOW (ref 137–147)
TOTAL PROTEIN: 5.9 g/dL — AB (ref 6.0–8.3)
Total Bilirubin: 7.8 mg/dL — ABNORMAL HIGH (ref 0.3–1.2)

## 2013-08-29 LAB — RAPID URINE DRUG SCREEN, HOSP PERFORMED
Amphetamines: NOT DETECTED
Barbiturates: NOT DETECTED
Benzodiazepines: NOT DETECTED
COCAINE: POSITIVE — AB
OPIATES: POSITIVE — AB
Tetrahydrocannabinol: NOT DETECTED

## 2013-08-29 LAB — CBC WITH DIFFERENTIAL/PLATELET
BASOS PCT: 0 % (ref 0–1)
Basophils Absolute: 0 10*3/uL (ref 0.0–0.1)
EOS PCT: 0 % (ref 0–5)
Eosinophils Absolute: 0 10*3/uL (ref 0.0–0.7)
HCT: 25.2 % — ABNORMAL LOW (ref 39.0–52.0)
HEMOGLOBIN: 8.8 g/dL — AB (ref 13.0–17.0)
LYMPHS PCT: 12 % (ref 12–46)
Lymphs Abs: 1.5 10*3/uL (ref 0.7–4.0)
MCH: 34 pg (ref 26.0–34.0)
MCHC: 34.9 g/dL (ref 30.0–36.0)
MCV: 97.3 fL (ref 78.0–100.0)
MONOS PCT: 15 % — AB (ref 3–12)
Monocytes Absolute: 1.8 10*3/uL — ABNORMAL HIGH (ref 0.1–1.0)
NEUTROS PCT: 73 % (ref 43–77)
Neutro Abs: 8.9 10*3/uL — ABNORMAL HIGH (ref 1.7–7.7)
Platelets: 77 10*3/uL — ABNORMAL LOW (ref 150–400)
RBC: 2.59 MIL/uL — AB (ref 4.22–5.81)
RDW: 20.8 % — ABNORMAL HIGH (ref 11.5–15.5)
WBC: 12.2 10*3/uL — AB (ref 4.0–10.5)

## 2013-08-29 LAB — PROTIME-INR
INR: 2.14 — AB (ref 0.00–1.49)
Prothrombin Time: 23.9 seconds — ABNORMAL HIGH (ref 11.6–15.2)

## 2013-08-29 LAB — SODIUM, URINE, RANDOM: SODIUM UR: 9 meq/L

## 2013-08-29 LAB — CBC
HCT: 25.7 % — ABNORMAL LOW (ref 39.0–52.0)
HEMOGLOBIN: 8.7 g/dL — AB (ref 13.0–17.0)
MCH: 34 pg (ref 26.0–34.0)
MCHC: 33.9 g/dL (ref 30.0–36.0)
MCV: 100.4 fL — ABNORMAL HIGH (ref 78.0–100.0)
Platelets: 70 10*3/uL — ABNORMAL LOW (ref 150–400)
RBC: 2.56 MIL/uL — ABNORMAL LOW (ref 4.22–5.81)
RDW: 20.4 % — ABNORMAL HIGH (ref 11.5–15.5)
WBC: 8.2 10*3/uL (ref 4.0–10.5)

## 2013-08-29 LAB — LACTIC ACID, PLASMA: Lactic Acid, Venous: 1.9 mmol/L (ref 0.5–2.2)

## 2013-08-29 LAB — OSMOLALITY: Osmolality: 264 mOsm/kg — ABNORMAL LOW (ref 275–300)

## 2013-08-29 LAB — MRSA PCR SCREENING: MRSA by PCR: POSITIVE — AB

## 2013-08-29 LAB — OSMOLALITY, URINE: OSMOLALITY UR: 737 mosm/kg (ref 390–1090)

## 2013-08-29 MED ORDER — CHLORHEXIDINE GLUCONATE CLOTH 2 % EX PADS: 6.0000 | MEDICATED_PAD | Freq: Every day | CUTANEOUS | Status: DC

## 2013-08-29 MED ORDER — LACTULOSE 10 GM/15ML PO SOLN
30.0000 g | Freq: Every day | ORAL | Status: DC
  Administered 2013-08-29: 30 g via ORAL
  Filled 2013-08-29: qty 45

## 2013-08-29 MED ORDER — VANCOMYCIN HCL IN DEXTROSE 1-5 GM/200ML-% IV SOLN
1000.0000 mg | Freq: Three times a day (TID) | INTRAVENOUS | Status: DC
  Administered 2013-08-29 – 2013-09-04 (×19): 1000 mg via INTRAVENOUS
  Filled 2013-08-29 (×20): qty 200

## 2013-08-29 MED ORDER — PIPERACILLIN-TAZOBACTAM 3.375 G IVPB
3.3750 g | Freq: Three times a day (TID) | INTRAVENOUS | Status: DC
  Administered 2013-08-29 – 2013-09-05 (×22): 3.375 g via INTRAVENOUS
  Filled 2013-08-29 (×23): qty 50

## 2013-08-29 MED ORDER — DIPHENHYDRAMINE HCL 50 MG/ML IJ SOLN
25.0000 mg | Freq: Once | INTRAMUSCULAR | Status: AC
  Administered 2013-08-29: 25 mg via INTRAVENOUS
  Filled 2013-08-29: qty 1

## 2013-08-29 MED ORDER — IBUPROFEN 800 MG PO TABS
800.0000 mg | ORAL_TABLET | Freq: Once | ORAL | Status: AC
  Administered 2013-08-29: 800 mg via ORAL
  Filled 2013-08-29: qty 1

## 2013-08-29 MED ORDER — MUPIROCIN 2 % EX OINT
1.0000 "application " | TOPICAL_OINTMENT | Freq: Two times a day (BID) | CUTANEOUS | Status: AC
  Administered 2013-08-29 – 2013-09-03 (×10): 1 via NASAL
  Filled 2013-08-29: qty 22

## 2013-08-29 MED ORDER — ENSURE COMPLETE PO LIQD: 237.0000 mL | Freq: Two times a day (BID) | ORAL | Status: DC

## 2013-08-29 MED ORDER — ENSURE COMPLETE PO LIQD
237.0000 mL | Freq: Three times a day (TID) | ORAL | Status: DC
  Administered 2013-08-29 – 2013-09-03 (×6): 237 mL via ORAL

## 2013-08-29 NOTE — Progress Notes (Signed)
Clinical Social Work Department BRIEF PSYCHOSOCIAL ASSESSMENT 08/29/2013  Patient:  Xavier Valentine, Xavier Valentine     Account Number:  [id number]     Admit date:  08/28/2013  Clinical Social Worker:  Earlie Server  Date/Time:  08/29/2013 09:30 AM  Referred by:  Physician  Date Referred:  08/29/2013 Referred for  Substance Abuse   Other Referral:   Interview type:  Patient Other interview type:    PSYCHOSOCIAL DATA Living Status:  FAMILY Admitted from facility:   Level of care:   Primary support name:  Lattie Haw Primary support relationship to patient:  FAMILY Degree of support available:   Adequate    CURRENT CONCERNS Current Concerns  Substance Abuse   Other Concerns:    SOCIAL WORK ASSESSMENT / PLAN CSW received referral in order to complete psychosocial assessment. CSW reviewed chart and met with patient at bedside. CSW introduced myself and explained role.    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 Patient states he was very proud of his business and people all over the [place] would request him because he did such a great job. Patient developed a hernia and tried to claim injuries under workman's comp but was denied. Patient states he eventually qualified for Medicaid and was able to have surgery about 1 week ago. Patient reports that after surgery he realized he needed additional help so he planned to come to [place] where he his family lives for additional help. Patient reports the drive was harder than he thought it would be and had to come to the hospital because of complications.    Patient is not married and does not have children. Patient reports he moves around often and is "not tied down." Patient plans to recover in [place] but then will move to [place]. Patient reports that he does not need any assistance at DC because his family will assist with whatever he needs.    CSW spoke with patient about his emotional wellbeing  and his ability to adjust to move. Patient denies any history of depression or anxiety and has not had any follow up in the past. Patient reports he has been drinking alcohol for the past 20 years. Patient started drinking socially and reports he drinks about 2 drinks daily. Patient enjoys mixed drinks and wine. Patient states that after a stressful day at work it was nice to come home and have a few drinks. Patient states that he does use alcohol as a coping mechanism but is aware that it is not helpful to drink alcohol while his body is trying to heal. Patient reports he will stop drinking altogether when he leaves the hospital so that he can heal. Patient reports that he does not feel addicted to alcohol and has never received any treatment in the past. CSW suggested outpatient follow up for substance use to help develop positive coping skills. Patient politely declined any further follow up and reports that since he is with his family he will not feel as stressed. CSW also inquired about any drug use and patient states he occasionally uses cocaine 1 a week because it would give him more energy and he was able to accomplish more. Patient states since he is not working and does not need to work long hours then he will not use cocaine. Patient denies any further substance or drug use.    Patient reports he has no CSW needs and will follow up with MD appointments at  DC. CSW is signing off but available if needed.   Assessment/plan status:  No Further Intervention Required Other assessment/ plan:   SBIRT   Information/referral to community resources:   Patient declines any substance abuse treatment    PATIENT'S/FAMILY'S RESPONSE TO PLAN OF CARE: Patient alert and oriented. Patient engaged during assessment but does not feel that his substance use is an issue that needs to be discussed. Patient confident that he can eliminate all substance use and reports he is motivated to heal quickly so that he can  start working again soon. Patient is very proud of his profession and reports he has a great reputation because he is such a Scientist, research (physical sciences). Patient thanked CSW for visit but reports no CSW needs at this time.       Sindy Messing, LCSW (Coverage for [employer])

## 2013-08-29 NOTE — Progress Notes (Signed)
[facility] PROGRESS NOTE  Xavier Valentine ZOX:[id number] DOB: 11/09/1960 DOA: 08/28/2013 PCP: No PCP Per Patient  Assessment/Plan  Right inguinal and abdominal Hematoma / Post-operative complication due to coagulopathy due to cirrhosis, continuing to have bleeding but appears to be slowing -  given Vit K in ER -  Cryoprecipitate and FFP given 8/27 -  Appreciate general surgery recommendations -  Dressing currently clean and dry.  Did not remove to observe incision at this time.  RN to call when she changes again. -  Bed rest/hip precautions until bleeding stops  Severe sepsis (fever, leukocytosis, low blood pressure) due to possible HCAP, although asymptomatic vs. Infected hematoma.  Blood pressure decreasing.   -  F/u blood cultures -  F/u urine culture -  Continue vancomycin and zosyn day 2 -  Continue IVF while hypotensive  Cirrhosis of liver - Hep C and Alcohol induce with resultant thrombocytopenia, coagulopathy, ascites, elevated bili, low albumin -  Not on diuretics at home according to med list -  Unclear if hx of varices -  D/c tylenol/minimize hepatotoxins  Ascites, not tense and non-tender  Slow speech/foggy thinking concerning for possible hepatic encephalopathy -  Start lactulose  ETOH abuse -  CIWA protocol -  Thiamine, folate, MVI  Cocaine positive  - avoid B blockers - discussed health complications of Cocaine use with patient.   Leukocytosis, possibly due to pneumonia, infected hematoma, UA with rare WBC.   -  Abx as above -  Repeat WBC in AM  Acute blood loss anemia with ongoing bleeding from incision site -  Repeat hgb this afternoon -  Transfuse to keep hgb > 8 given ongoing bleeding  Progressive thrombocytopenia, likely due to cirrhosis and consumption from bleeding -  Transfuse to keep plt > 50K  Hypoalbuminemia, due to cirrhosis -  Maximize nutrition  Hyponatremia due to cirrhosis  - Urine sodium 9 and osmolality 737  Calcium  corrects with albumin  Lactic acidosis is common in setting of cirrhosis  Cachexia, severe protein calorie malnutrition -  Liberalize diet -  Supplements -  Nutrition consultation  Diet:  regular Access:  PIV IVF:  off Proph:  SCD  Code Status: full Family Communication: patient alone Disposition Plan:  Transfer to stepdown for now.  Patient and mother advised that he is very ill.  Cirrhosis has poor prognosis.  Mother has good insight, asks appropriate questions and is worried.  Patient is in denial.     Consultants:  Gen surg  Procedures:  CT ab/p  Antibiotics:  vanc 8/27 >>  Zosyn 8/27 >>   HPI/Subjective:  Denies fevers, chills, nausea, vomiting.  Had to strain to have last BM.  Difficulty urinating because penis is swollen.    Objective: Filed Vitals:   08/29/13 0430 08/29/13 0515 08/29/13 1330 08/29/13 1345  BP: 108/64 100/55 84/57 90/60   Pulse: 80 75    Temp: 98.9 F (37.2 C) 98.8 F (37.1 C)    TempSrc: Oral Oral    Resp: 24 22    Height:      Weight:      SpO2: 97% 96% 100%     Intake/Output Summary (Last 24 hours) at 08/29/13 1504 Last data filed at 08/29/13 0900  Gross per 24 hour  Intake 2412.5 ml  Output    200 ml  Net 2212.5 ml   Filed Weights   08/28/13 2338  Weight: 89.1 kg (196 lb 6.9 oz)    Exam:   General:  Cachectic  WM, No acute distress, jaundiced, articulate but slow to speak and eyes close intermittently  HEENT:  NCAT, MMM, scleral icterus   Cardiovascular:  RRR, nl S1, S2 no mrg, 2+ pulses, warm extremities  Respiratory:  CTAB, no increased WOB  Abdomen:   NABS, soft, mildly distended, NT  GU:  Penis and scrotum swollen/ecchymotic, but not erythematous  MSK:   Normal tone and bulk, trace bilateral LEE  Neuro:  Grossly moves all extremities  Skin:  Ecchymoses start below umbilicus and extend along perineum and down medial aspect of both legs to feet  Data Reviewed: Basic Metabolic Panel:  Recent Labs Lab  08/28/13 1357 08/29/13 0420  NA 127* 127*  K 4.0 3.9  CL 90* 95*  CO2 20 22  GLUCOSE 88 95  BUN 14 16  CREATININE 0.76 0.77  CALCIUM 7.4* 6.8*  MG 1.7  --   PHOS 3.1  --    Liver Function Tests:  Recent Labs Lab 08/28/13 1357 08/29/13 0420  AST 46* 33  ALT 28 21  ALKPHOS 100 77  BILITOT 8.6* 7.8*  PROT 6.9 5.9*  ALBUMIN 2.0* 1.8*    Recent Labs Lab 08/28/13 1357  LIPASE 26    Recent Labs Lab 08/28/13 1357  AMMONIA 60   CBC:  Recent Labs Lab 08/28/13 1357 08/28/13 1808 08/29/13 0420  WBC 23.7*  --  12.2*  NEUTROABS 18.3*  --  8.9*  HGB 10.7*  --  8.8*  HCT 30.7*  --  25.2*  MCV 98.1  --  97.3  PLT 139* 93* 77*   Cardiac Enzymes: No results found for this basename: CKTOTAL, CKMB, CKMBINDEX, TROPONINI,  in the last 168 hours BNP (last 3 results)  Recent Labs  08/28/13 1357  PROBNP 739.8*   CBG: No results found for this basename: GLUCAP,  in the last 168 hours  Recent Results (from the past 240 hour(s))  CULTURE, BLOOD (ROUTINE X 2)     Status: None   Collection Time    08/28/13  1:57 PM      Result Value Ref Range Status   Specimen Description BLOOD LEFT ANTECUBITAL   Final   Special Requests BOTTLES DRAWN AEROBIC AND ANAEROBIC   Final   Culture  Setup Time     Final   Value: 08/28/2013 16:24     Performed at [facility]   Culture     Final   Value:        BLOOD CULTURE RECEIVED NO GROWTH TO DATE CULTURE WILL BE HELD FOR 5 DAYS BEFORE ISSUING A FINAL NEGATIVE REPORT     Performed at [facility]   Report Status PENDING   Incomplete  CULTURE, BLOOD (ROUTINE X 2)     Status: None   Collection Time    08/28/13  1:57 PM      Result Value Ref Range Status   Specimen Description BLOOD BLOOD LEFT FOREARM   Final   Special Requests BOTTLES DRAWN AEROBIC AND ANAEROBIC   Final   Culture  Setup Time     Final   Value: 08/28/2013 16:24     Performed at [facility]   Culture     Final   Value:        BLOOD  CULTURE RECEIVED NO GROWTH TO DATE CULTURE WILL BE HELD FOR 5 DAYS BEFORE ISSUING A FINAL NEGATIVE REPORT     Performed at [facility]   Report Status PENDING  Incomplete     Studies: Ct Abdomen Pelvis W Contrast  08/28/2013   CLINICAL DATA:  Right inguinal repair 1 week prior. Drainage from incision.  EXAM: CT ABDOMEN AND PELVIS WITH CONTRAST  TECHNIQUE: Multidetector CT imaging of the abdomen and pelvis was performed using the standard protocol following bolus administration of intravenous contrast.  CONTRAST:  OMNIPAQUE IOHEXOL 300 MG/ML SOLN, 50mL OMNIPAQUE IOHEXOL 300 MG/ML SOLN  COMPARISON:  None.  FINDINGS: Moderate bilateral pleural effusions. Consolidative opacity within the right and left lower lobes.  The liver is small and nodular in contour compatible with cirrhosis. There is a 13 mm low-attenuation lesion within the hepatic dome, suggestive of a cyst. Gallbladder is mildly distended. Portal vein is patent. Spleen is enlarged measuring 19 cm. Pancreas and bilateral adrenal glands are unremarkable. Kidneys enhance symmetrically with contrast. Normal caliber abdominal aorta.  Multiple prominent retroperitoneal lymph nodes measuring up to 1.1 cm (image 37; series 2). Urinary bladder is unremarkable. Prostate unremarkable. Sigmoid colonic diverticulosis. No evidence for acute diverticulitis wall thickening of the cecum and ascending colon, likely secondary to portal collapse of the. Re- cannulization of the paraumbilical vein.  Postoperative changes within the right inguinal region. There is a large amount of fluid within the right inguinal region extending into the scrotum through the inguinal canal. High attenuation within the right hemiscrotum may represent 2 blood products. Overlying soft tissue edema.  No aggressive or acute appearing osseous lesions.  IMPRESSION: 1. Postoperative changes within the right inguinal region compatible with recent right inguinal hernia repair. There  is a large amount of fluid extending through the inguinal canal into the right hemiscrotum which is distended with overlying soft tissue edema. High attenuation within the right hemiscrotum may represent acute blood products. Recommend correlation with ultrasound. 2. Multiple nonspecific prominent retroperitoneal lymph nodes. Recommend correlation with laboratory analysis. Additionally a followup CT in 3 months may prove helpful to ensure stability. 3. Morphologic changes to the liver compatible with cirrhosis and sequelae of portal venous hypertension including splenomegaly. 4. Moderate bilateral pleural effusions. Underlying opacities suggestive of atelectasis.   Electronically Signed   By: Annia Belt M.D.   On: 08/28/2013 21:57    Scheduled Meds: . feeding supplement (ENSURE COMPLETE)  237 mL Oral BID BM  . folic acid  1 mg Oral Daily  . lactulose  30 g Oral Daily  . LORazepam  0-4 mg Intravenous Q6H   Followed by  . [START ON 08/30/2013] LORazepam  0-4 mg Intravenous Q12H  . multivitamin with minerals  1 tablet Oral Daily  . piperacillin-tazobactam (ZOSYN)  IV  3.375 g Intravenous Q8H  . thiamine  100 mg Oral Daily   Or  . thiamine  100 mg Intravenous Daily  . vancomycin  1,000 mg Intravenous Q8H   Continuous Infusions: . sodium chloride 125 mL/hr at 08/29/13 0130    Principal Problem:   Hematoma Active Problems:   Cirrhosis of liver   Post-operative complication   Coagulopathy    Time spent: 30 min    Emory Leaver, [facility]  [facility] Pager [phone]. If 7PM-7AM, please contact night-coverage at [email or web address], password [facility] 08/29/2013, 3:04 PM  LOS: 1 day

## 2013-08-29 NOTE — Progress Notes (Addendum)
Subjective: Somnolent. Arousable.  Carries on a conversation. No distress. Niece in room.Denies abdominal pain or nausea. Says he is hungry.  CT scan reviewed. There is a hematoma in the right groin and in the scrotum but there is no sign of abscess, hernia breakdown, or intestinal entrapment.Moderate bilateral pleural effusions, bibasilar consolidation, liver atrophy compatible with cirrhosis, ascites, patent portal vein.  Hemoglobin 8.8, WBC down to 12,200. Lacticacidemia normal at 1.9. Bilirubin 7.8. Albumin 1.8.INR 2.14. Minimal correction.Drug screen positive for cocaine  Objective: Vital signs in last 24 hours: Temp:  [98.4 F (36.9 C)-101.1 F (38.4 C)] 98.8 F (37.1 C) (08/28 0515) Pulse Rate:  [75-94] 75 (08/28 0515) Resp:  [18-31] 22 (08/28 0515) BP: (100-143)/(54-95) 100/55 mmHg (08/28 0515) SpO2:  [92 %-100 %] 96 % (08/28 0515) Weight:  [196 lb 6.9 oz (89.1 kg)] 196 lb 6.9 oz (89.1 kg) (08/27 2338) Last BM Date: 08/28/13  Intake/Output from previous day: 08/27 0701 - 08/28 0700 In: 1812.5 [I.V.:1437.5; Blood:125; IV Piggyback:250] Out: 200 [Urine:200] Intake/Output this shift:     EXAM: General appearance: arousable. Deconditioned. Muscle wasting. Jaundiced.Answers  questions appropriately. Flattened affect. GI: abdomen is soft and nontender and nondistended. Question fluid wave. Lots of ecchymoses but no hematoma of abdominal wall. Male genitalia: normal, right groin incision is swollen. Staples in place. Old dark bloody drainage. No purulence. Scrotum is swollen. No skin breakdown. No larger than yesterday.  Lab Results:   Recent Labs  08/28/13 1357 08/28/13 1808 08/29/13 0420  WBC 23.7*  --  12.2*  HGB 10.7*  --  8.8*  HCT 30.7*  --  25.2*  PLT 139* 93* 77*   BMET  Recent Labs  08/28/13 1357 08/29/13 0420  NA 127* 127*  K 4.0 3.9  CL 90* 95*  CO2 20 22  GLUCOSE 88 95  BUN 14 16  CREATININE 0.76 0.77  CALCIUM 7.4* 6.8*   PT/INR  Recent  Labs  08/28/13 1808 08/29/13 0420  LABPROT 25.8* 23.9*  INR 2.36* 2.14*   ABG No results found for this basename: PHART, PCO2, PO2, HCO3,  in the last 72 hours  Studies/Results: Ct Abdomen Pelvis W Contrast  08/28/2013   CLINICAL DATA:  Right inguinal repair 1 week prior. Drainage from incision.  EXAM: CT ABDOMEN AND PELVIS WITH CONTRAST  TECHNIQUE: Multidetector CT imaging of the abdomen and pelvis was performed using the standard protocol following bolus administration of intravenous contrast.  CONTRAST:  OMNIPAQUE IOHEXOL 300 MG/ML SOLN, 50mL OMNIPAQUE IOHEXOL 300 MG/ML SOLN  COMPARISON:  None.  FINDINGS: Moderate bilateral pleural effusions. Consolidative opacity within the right and left lower lobes.  The liver is small and nodular in contour compatible with cirrhosis. There is a 13 mm low-attenuation lesion within the hepatic dome, suggestive of a cyst. Gallbladder is mildly distended. Portal vein is patent. Spleen is enlarged measuring 19 cm. Pancreas and bilateral adrenal glands are unremarkable. Kidneys enhance symmetrically with contrast. Normal caliber abdominal aorta.  Multiple prominent retroperitoneal lymph nodes measuring up to 1.1 cm (image 37; series 2). Urinary bladder is unremarkable. Prostate unremarkable. Sigmoid colonic diverticulosis. No evidence for acute diverticulitis wall thickening of the cecum and ascending colon, likely secondary to portal collapse of the. Re- cannulization of the paraumbilical vein.  Postoperative changes within the right inguinal region. There is a large amount of fluid within the right inguinal region extending into the scrotum through the inguinal canal. High attenuation within the right hemiscrotum may represent 2 blood products. Overlying soft tissue  edema.  No aggressive or acute appearing osseous lesions.  IMPRESSION: 1. Postoperative changes within the right inguinal region compatible with recent right inguinal hernia repair. There is a large  amount of fluid extending through the inguinal canal into the right hemiscrotum which is distended with overlying soft tissue edema. High attenuation within the right hemiscrotum may represent acute blood products. Recommend correlation with ultrasound. 2. Multiple nonspecific prominent retroperitoneal lymph nodes. Recommend correlation with laboratory analysis. Additionally a followup CT in 3 months may prove helpful to ensure stability. 3. Morphologic changes to the liver compatible with cirrhosis and sequelae of portal venous hypertension including splenomegaly. 4. Moderate bilateral pleural effusions. Underlying opacities suggestive of atelectasis.   Electronically Signed   By: Annia Belt M.D.   On: 08/28/2013 21:57    Anti-infectives: Anti-infectives   Start     Dose/Rate Route Frequency Ordered Stop   08/29/13 0400  piperacillin-tazobactam (ZOSYN) IVPB 3.375 g     3.375 g 12.5 mL/hr over 240 Minutes Intravenous Every 8 hours 08/29/13 0347     08/29/13 0400  vancomycin (VANCOCIN) IVPB 1000 mg/200 mL premix     1,000 mg 200 mL/hr over 60 Minutes Intravenous Every 8 hours 08/29/13 0347     08/28/13 1400  piperacillin-tazobactam (ZOSYN) IVPB 4.5 g  Status:  Discontinued     4.5 g 200 mL/hr over 30 Minutes Intravenous  Once 08/28/13 1352 08/28/13 1356   08/28/13 1400  vancomycin (VANCOCIN) IVPB 1000 mg/200 mL premix     1,000 mg 200 mL/hr over 60 Minutes Intravenous  Once 08/28/13 1352 08/28/13 1548   08/28/13 1400  piperacillin-tazobactam (ZOSYN) IVPB 3.375 g     3.375 g 100 mL/hr over 30 Minutes Intravenous  Once 08/28/13 1357 08/28/13 1547      Assessment/Plan:  Wound and scrotal hematoma. One week postop right inguinal hernia repair in [place] by history. Details unknown  Draining some old blood but no active bleeding. Hematoma stable in size. Clinically there is no evidence of wound infection, Intestinal entrapment, or recurrence of the hernia. Given comorbidities of cirrhosis,  protein calorie malnutrition, and hematoma, he is at increased risk for wound complication and wound breakdown. There is no indication to open the wound at this point in time. In fact, relatively contraindicated.  Ice pack   Coagulopathy secondary to cirrhosis.  This has obviously contributed to his wound hemorrhage.  Vitamin K and cryoprecipitate ineffective.  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 Continue Zosyn and vancomycin.  Associated hypoalbuminemia, anticipate poor wound healing.   Hepatitis C and cirrhosis by history. Advanced as manifest by ascites and inhibited synthetic function. Poor prognosis.  Alcohol abuse, active. Risk for withdrawal syndrome  Cocaine use, drug screen positive  Mild lactic acidosis. ?dehydrated a bit.  Elevated BNP    LOS: 1 day    Ayse Mccartin M 08/29/2013

## 2013-08-29 NOTE — Care Management Note (Addendum)
    Page 1 of 2   09/11/2013     2:42:01 PM CARE MANAGEMENT NOTE 09/11/2013  Patient:  Xavier Valentine, Xavier Valentine   Account Number:  [id number]  Date Initiated:  08/29/2013  Documentation initiated by:  [facility]  Subjective/Objective Assessment:   53 Y/O M ADMITTED W/ANEMIA.     Action/Plan:   FROM OUT OF TOWN-[place].   Anticipated DC Date:  09/10/2013   Anticipated DC Plan:  HOME W HOME HEALTH SERVICES      DC Planning Services  CM consult  Patient refused services      Choice offered to / List presented to:             Status of service:  Completed, signed off Medicare Important Message given?   (If response is "NO", the following Medicare IM given date fields will be blank) Date Medicare IM given:   Medicare IM given by:   Date Additional Medicare IM given:   Additional Medicare IM given by:    Discharge Disposition:  HOME/SELF CARE  Per UR Regulation:  Reviewed for med. necessity/level of care/duration of stay  If discussed at Long Length of Stay Meetings, dates discussed:   09/02/2013  09/04/2013  09/09/2013    Comments:  09/11/13 Dontrell Stuck RN,BSN NCM [phone] PATIENT D/C YESTERDAY, BUT SW RECEIVED CALL FROM PATIENT'S SISTER ABOUT MEDS-RIFAXIN-TOO EXPENSIVE,& SCRIPT FOR PAIN MED.MD SPOKE TO SISTER & SAID PATIENT DID NOT NEED TO TAKE THE RIFAXIN, & MD WROTE HARD SCRIPT FOR PERCOCET.FAMILY WILL P/U SCRIPT FROM THE UNIT.NO FURTHER NEEDS.  09/10/13 Nayely Dingus RN,BSN CM [phone] SPOKE TO SISTER PER PATIENT AGREEMENT ON SPEAKER PHONE W/CSW/STAFF NURSE,& NURSE TECH ABOUT D/C PLANS.SISTER INITIALLY AGREE TO HOME W/HHC,BUT ONCE IDENTIFIED THAT PATIENT HAS MEDICAID OUT OF STATE,WHICH IS NO HEALTH INSURANCE UNTIL IT BECOMES ACTIVE.SISTER SAYS SHE CAN MANAGE HIM @ HOME SINCE SHE WORKS FROM HOME,& HER DTR WILL BE @ HOME TO ASSIST.SISTER DIDN'T WANT TOO MANY PEOPLE IN/OUT OF HOUSE.[facility] KRISTEN ONLY ABLE TO PROVIDE PRIVATE PAY SERVICES. SISTER ONLY WANTS HOME & NO HOME HEALTH CARE.MD  UPDATED,FAMILY TO PICK PATIENT UP & TAKE HOME IN THEIR OWN VEHICLE. SISTER ALSO MENTIONED PATIENT HAS TRAVEL[employer] WORK[employer] COMP INSURANCE-INFORMED HER TO CONTACT ADMITTING WHO WOULD NEED THAT INFORMATION FOR INSURANCE VERIFICATION,GAVE HER TEL#-VOICED UNDERSTANDING.MD UPDATED.PROVIDED W/[facility] F/U TO WALK IN OR CALL.SISTER VOICED UNDERSTANDING.  09/09/13 Belky Mundo RN,BSN NCM [phone] D/C SNF. Rhonda davis,RN,BSN,CCM continues to be high risk for bleeding iv amicar, iv vit.K, ecchymosis of the body is improving.  [id number] Davis,Rn,BSN,CCM: patient transfer to sdu on [id number] due to hypotension and continued bleeding/pt is confused -hc of polysubstance and etoh abuse/has family in the area but only recently moved to this area from [facility]  08/29/13 Duriel Deery RN,BSN NCM [phone] NO ANTICIPATED D/C NEEDS.

## 2013-08-29 NOTE — Progress Notes (Signed)
ANTIBIOTIC CONSULT NOTE - INITIAL  Pharmacy Consult for Zosyn and Vancomycn Indication: Leukocytosis/post right inguinal hernia repair ~ 1 wk ago  Allergies  Allergen Reactions  . Bee Venom Swelling    Patient Measurements: Height:  (180.3 cm) Weight: 196 lb 6.9 oz (89.1 kg) IBW/kg (Calculated) : 75.3   Vital Signs: Temp: 101.1 F (38.4 C) (08/28 0206) Temp src: Oral (08/28 0206) BP: 108/67 mmHg (08/28 0206) Pulse Rate: 89 (08/28 0206) Intake/Output from previous day: 08/27 0701 - 08/28 0700 In: 300 [I.V.:300] Out: 0  Intake/Output from this shift:    Labs:  Recent Labs  08/28/13 1357 08/28/13 1808  WBC 23.7*  --   HGB 10.7*  --   PLT 139* 93*  CREATININE 0.76  --    Estimated Creatinine Clearance: 111.1 ml/min (by C-G formula based on Cr of 0.76). No results found for this basename: VANCOTROUGH, VANCOPEAK, VANCORANDOM, GENTTROUGH, GENTPEAK, GENTRANDOM, TOBRATROUGH, TOBRAPEAK, TOBRARND, AMIKACINPEAK, AMIKACINTROU, AMIKACIN,  in the last 72 hours   Microbiology: No results found for this or any previous visit (from the past 720 hour(s)).  Medical History: Past Medical History  Diagnosis Date  . Alcoholism   . Inguinal hernia   . Hepatitis C   . Perirectal abscess     Medications:  Scheduled:  . sodium chloride   Intravenous STAT  . docusate sodium  100 mg Oral BID  . folic acid  1 mg Oral Daily  . LORazepam  0-4 mg Intravenous Q6H   Followed by  . [START ON 08/30/2013] LORazepam  0-4 mg Intravenous Q12H  . multivitamin with minerals  1 tablet Oral Daily  . piperacillin-tazobactam (ZOSYN)  IV  3.375 g Intravenous Q8H  . senna  1 tablet Oral BID  . thiamine  100 mg Oral Daily   Or  . thiamine  100 mg Intravenous Daily  . vancomycin  1,000 mg Intravenous Q8H   Infusions:  . sodium chloride 100 mL/hr at 08/28/13 2058   Assessment: 53 yoM with hx ETOH abuse and Hep C s/p right inguinal hernia repair ~ week ago now with elevated WBCs and fever.   Vancomycin and Zosyn per Rx.   Goal of Therapy:  Vancomycin trough level 15-20 mcg/ml  Plan:   Zosyn 3.375 Gm IV q8h EI infusion  Vancomycin 1 Gm IV q8h   F/u SCr/levels/cultures as needed  Susanne Greenhouse R 08/29/2013,3:52 AM

## 2013-08-29 NOTE — Progress Notes (Signed)
INITIAL NUTRITION ASSESSMENT  DOCUMENTATION CODES Per approved criteria  -Not Applicable   INTERVENTION: - Ensure Complete TID - RD to continue to monitor   NUTRITION DIAGNOSIS: Increased nutrient needs related to cirrhosis with severe sepsis as evidenced by MD notes.   Goal: Pt to consume >90% of meals/supplements  Monitor:  Weights, labs, intake  Reason for Assessment: Consult for assessment   53 y.o. male  Admitting Dx: Severe sepsis  ASSESSMENT: Pt with past medical h/o of ETOH abuse and Hep C who underwent a right inguinal hernia repair in [place] 1 wk ago. He was doing well post op until 2 days ago when he states the wound began to bleed. It has continued to bleed for these 2 days and he therefore decided to come to the ER. He has no c/o dizziness lightheaded sensation with standing. He is noted to have a significantly elevated bilirubin and an elevated INR. Found to have right inguinal and abdominal hematoma with severe sepsis and cirrhosis with ascites.   - Pt positive for cocaine and opiates on admission - Unable to meet with pt due to pt being transferred to ICU - No meal intake documented today   Height: Ht Readings from Last 1 Encounters:  08/28/13  (1.803 m)    Weight: Wt Readings from Last 1 Encounters:  08/28/13 196 lb 6.9 oz (89.1 kg)    Ideal Body Weight: 172 lbs   % Ideal Body Weight: 114%  Wt Readings from Last 10 Encounters:  08/28/13 196 lb 6.9 oz (89.1 kg)    Usual Body Weight: Unable to assess    BMI:  Body mass index is 27.41 kg/(m^2). Overweight  Estimated Nutritional Needs: Kcal: 2300-2500 Protein: 120-140g Fluid: per MD  Skin: Jaundice, right groin incision   Diet Order: General  EDUCATION NEEDS: -No education needs identified at this time   Intake/Output Summary (Last 24 hours) at 08/29/13 1547 Last data filed at 08/29/13 1540  Gross per 24 hour  Intake 2412.5 ml  Output    400 ml  Net 2012.5 ml    Last  BM:  8/27  Labs:   Recent Labs Lab 08/28/13 1357 08/29/13 0420  NA 127* 127*  K 4.0 3.9  CL 90* 95*  CO2 20 22  BUN 14 16  CREATININE 0.76 0.77  CALCIUM 7.4* 6.8*  MG 1.7  --   PHOS 3.1  --   GLUCOSE 88 95    CBG (last 3)  No results found for this basename: GLUCAP,  in the last 72 hours  Scheduled Meds: . feeding supplement (ENSURE COMPLETE)  237 mL Oral BID BM  . folic acid  1 mg Oral Daily  . lactulose  30 g Oral Daily  . LORazepam  0-4 mg Intravenous Q6H   Followed by  . [START ON 08/30/2013] LORazepam  0-4 mg Intravenous Q12H  . multivitamin with minerals  1 tablet Oral Daily  . piperacillin-tazobactam (ZOSYN)  IV  3.375 g Intravenous Q8H  . thiamine  100 mg Oral Daily   Or  . thiamine  100 mg Intravenous Daily  . vancomycin  1,000 mg Intravenous Q8H    Continuous Infusions: . sodium chloride 100 mL/hr at 08/29/13 1544    Past Medical History  Diagnosis Date  . Alcoholism   . Inguinal hernia   . Hepatitis C   . Perirectal abscess     Past Surgical History  Procedure Laterality Date  . Hernia repair  Carlis Stable MS, [place], LDN [phone] Pager [phone] Weekend/After Hours Pager

## 2013-08-30 ENCOUNTER — Inpatient Hospital Stay (HOSPITAL_COMMUNITY): Payer: Medicaid - Out of State

## 2013-08-30 DIAGNOSIS — R6520 Severe sepsis without septic shock: Secondary | ICD-10-CM

## 2013-08-30 DIAGNOSIS — N501 Vascular disorders of male genital organs: Secondary | ICD-10-CM

## 2013-08-30 DIAGNOSIS — A419 Sepsis, unspecified organism: Secondary | ICD-10-CM

## 2013-08-30 LAB — URINE CULTURE

## 2013-08-30 LAB — CBC
HCT: 28.6 % — ABNORMAL LOW (ref 39.0–52.0)
Hemoglobin: 9.7 g/dL — ABNORMAL LOW (ref 13.0–17.0)
MCH: 33.6 pg (ref 26.0–34.0)
MCHC: 33.9 g/dL (ref 30.0–36.0)
MCV: 99 fL (ref 78.0–100.0)
Platelets: 103 10*3/uL — ABNORMAL LOW (ref 150–400)
RBC: 2.89 MIL/uL — ABNORMAL LOW (ref 4.22–5.81)
RDW: 19.4 % — AB (ref 11.5–15.5)
WBC: 14.1 10*3/uL — ABNORMAL HIGH (ref 4.0–10.5)

## 2013-08-30 LAB — LIPASE, BLOOD: Lipase: 33 U/L (ref 11–59)

## 2013-08-30 LAB — COMPREHENSIVE METABOLIC PANEL
ALT: 24 U/L (ref 0–53)
ANION GAP: 12 (ref 5–15)
AST: 40 U/L — ABNORMAL HIGH (ref 0–37)
Albumin: 1.8 g/dL — ABNORMAL LOW (ref 3.5–5.2)
Alkaline Phosphatase: 97 U/L (ref 39–117)
BILIRUBIN TOTAL: 6.3 mg/dL — AB (ref 0.3–1.2)
BUN: 24 mg/dL — AB (ref 6–23)
CHLORIDE: 93 meq/L — AB (ref 96–112)
CO2: 22 mEq/L (ref 19–32)
Calcium: 6.9 mg/dL — ABNORMAL LOW (ref 8.4–10.5)
Creatinine, Ser: 0.92 mg/dL (ref 0.50–1.35)
GFR calc non Af Amer: >90 mL/min (ref >90)
GLUCOSE: 158 mg/dL — AB (ref 70–99)
Potassium: 3.6 mEq/L — ABNORMAL LOW (ref 3.7–5.3)
SODIUM: 127 meq/L — AB (ref 137–147)
Total Protein: 6.4 g/dL (ref 6.0–8.3)

## 2013-08-30 LAB — PROTIME-INR
INR: 2.01 — AB (ref 0.00–1.49)
Prothrombin Time: 22.8 seconds — ABNORMAL HIGH (ref 11.6–15.2)

## 2013-08-30 LAB — PREPARE CRYOPRECIPITATE: UNIT DIVISION: 0

## 2013-08-30 LAB — VANCOMYCIN, TROUGH: Vancomycin Tr: 17.4 ug/mL (ref 10.0–20.0)

## 2013-08-30 LAB — CLOSTRIDIUM DIFFICILE BY PCR: Toxigenic C. Difficile by PCR: NEGATIVE

## 2013-08-30 MED ORDER — POTASSIUM CHLORIDE 10 MEQ/100ML IV SOLN
10.0000 meq | INTRAVENOUS | Status: AC
  Administered 2013-08-30 (×4): 10 meq via INTRAVENOUS
  Filled 2013-08-30 (×4): qty 100

## 2013-08-30 MED ORDER — CALCIUM CARBONATE ANTACID 500 MG PO CHEW: 1.0000 | CHEWABLE_TABLET | ORAL | Status: DC | PRN

## 2013-08-30 MED ORDER — FUROSEMIDE 10 MG/ML IJ SOLN
20.0000 mg | Freq: Once | INTRAMUSCULAR | Status: AC
  Administered 2013-08-30: 20 mg via INTRAVENOUS
  Filled 2013-08-30: qty 2

## 2013-08-30 MED ORDER — PANTOPRAZOLE SODIUM 40 MG IV SOLR
40.0000 mg | Freq: Two times a day (BID) | INTRAVENOUS | Status: DC
  Administered 2013-08-30 (×2): 40 mg via INTRAVENOUS
  Filled 2013-08-30 (×2): qty 40

## 2013-08-30 MED ORDER — LORAZEPAM 2 MG/ML IJ SOLN
0.0000 mg | INTRAMUSCULAR | Status: DC
  Administered 2013-08-30: 4 mg via INTRAVENOUS
  Administered 2013-08-30: 2 mg via INTRAVENOUS
  Administered 2013-08-31: 1 mg via INTRAVENOUS
  Administered 2013-08-31: 2 mg via INTRAVENOUS
  Administered 2013-08-31: 1 mg via INTRAVENOUS
  Administered 2013-08-31 – 2013-09-01 (×4): 2 mg via INTRAVENOUS
  Administered 2013-09-01: 1 mg via INTRAVENOUS
  Administered 2013-09-01 (×2): 2 mg via INTRAVENOUS
  Administered 2013-09-01: 1 mg via INTRAVENOUS
  Administered 2013-09-02: 2 mg via INTRAVENOUS
  Administered 2013-09-02 (×2): 4 mg via INTRAVENOUS
  Administered 2013-09-02: 2 mg via INTRAVENOUS
  Administered 2013-09-02 (×2): 4 mg via INTRAVENOUS
  Administered 2013-09-03 (×4): 2 mg via INTRAVENOUS
  Administered 2013-09-04 (×2): 1 mg via INTRAVENOUS
  Administered 2013-09-04 (×5): 2 mg via INTRAVENOUS
  Administered 2013-09-04: 1 mg via INTRAVENOUS
  Administered 2013-09-05: 2 mg via INTRAVENOUS
  Administered 2013-09-05: 1 mg via INTRAVENOUS
  Filled 2013-08-30 (×5): qty 1
  Filled 2013-08-30: qty 2
  Filled 2013-08-30: qty 1
  Filled 2013-08-30: qty 2
  Filled 2013-08-30 (×4): qty 1
  Filled 2013-08-30: qty 2
  Filled 2013-08-30 (×2): qty 1
  Filled 2013-08-30: qty 2
  Filled 2013-08-30 (×9): qty 1
  Filled 2013-08-30: qty 2
  Filled 2013-08-30 (×8): qty 1

## 2013-08-30 MED ORDER — CHLORHEXIDINE GLUCONATE CLOTH 2 % EX PADS
6.0000 | MEDICATED_PAD | Freq: Every morning | CUTANEOUS | Status: DC
  Administered 2013-08-30 – 2013-09-10 (×12): 6 via TOPICAL

## 2013-08-30 MED ORDER — PHYTONADIONE 5 MG PO TABS
10.0000 mg | ORAL_TABLET | Freq: Once | ORAL | Status: AC
  Administered 2013-08-30: 10 mg via ORAL
  Filled 2013-08-30: qty 2

## 2013-08-30 MED ORDER — FUROSEMIDE 10 MG/ML IJ SOLN: 20.0000 mg | Freq: Once | INTRAMUSCULAR | Status: DC

## 2013-08-30 MED ORDER — RIFAXIMIN 550 MG PO TABS
550.0000 mg | ORAL_TABLET | Freq: Two times a day (BID) | ORAL | Status: DC
  Administered 2013-08-31 – 2013-09-10 (×20): 550 mg via ORAL
  Filled 2013-08-30 (×23): qty 1

## 2013-08-30 MED ORDER — IPRATROPIUM-ALBUTEROL 0.5-2.5 (3) MG/3ML IN SOLN
3.0000 mL | RESPIRATORY_TRACT | Status: DC | PRN
  Administered 2013-08-31 – 2013-09-06 (×8): 3 mL via RESPIRATORY_TRACT
  Filled 2013-08-30 (×8): qty 3

## 2013-08-30 MED ORDER — AMINOCAPROIC ACID 500 MG PO TABS
1.0000 g | ORAL_TABLET | Freq: Four times a day (QID) | ORAL | Status: DC
  Administered 2013-08-30 – 2013-09-02 (×11): 1 g via ORAL
  Filled 2013-08-30 (×20): qty 2

## 2013-08-30 NOTE — Progress Notes (Signed)
Subjective: Pt without complaint pleasant but a little somnolent  Objective: Vital signs in last 24 hours: Temp:  [96.5 F (35.8 C)] 96.5 F (35.8 C) (08/28 1800) Pulse Rate:  [60-89] 84 (08/29 0700) Resp:  [15-38] 26 (08/29 0700) BP: (83-137)/(39-97) 132/86 mmHg (08/29 0700) SpO2:  [87 %-100 %] 91 % (08/29 0700) Last BM Date: 08/30/13  Intake/Output from previous day: 08/28 0701 - 08/29 0700 In: 2520 [P.O.:720; I.V.:1300; IV Piggyback:500] Out: 500 [Urine:500] Intake/Output this shift:    Incision/Wound:right groin staples intact large amount of ecchymosis of right flank/abdomen/and significant scrotal hematoma.  Min serous drainage from staple line.  No pus   Lab Results:   Recent Labs  08/29/13 0420 08/29/13 1447  WBC 12.2* 8.2  HGB 8.8* 8.7*  HCT 25.2* 25.7*  PLT 77* 70*   BMET  Recent Labs  08/28/13 1357 08/29/13 0420  NA 127* 127*  K 4.0 3.9  CL 90* 95*  CO2 20 22  GLUCOSE 88 95  BUN 14 16  CREATININE 0.76 0.77  CALCIUM 7.4* 6.8*   PT/INR  Recent Labs  08/28/13 1808 08/29/13 0420  LABPROT 25.8* 23.9*  INR 2.36* 2.14*   ABG No results found for this basename: PHART, PCO2, PO2, HCO3,  in the last 72 hours  Studies/Results: Ct Abdomen Pelvis W Contrast  08/28/2013   CLINICAL DATA:  Right inguinal repair 1 week prior. Drainage from incision.  EXAM: CT ABDOMEN AND PELVIS WITH CONTRAST  TECHNIQUE: Multidetector CT imaging of the abdomen and pelvis was performed using the standard protocol following bolus administration of intravenous contrast.  CONTRAST:  OMNIPAQUE IOHEXOL 300 MG/ML SOLN, 50mL OMNIPAQUE IOHEXOL 300 MG/ML SOLN  COMPARISON:  None.  FINDINGS: Moderate bilateral pleural effusions. Consolidative opacity within the right and left lower lobes.  The liver is small and nodular in contour compatible with cirrhosis. There is a 13 mm low-attenuation lesion within the hepatic dome, suggestive of a cyst. Gallbladder is mildly distended.  Portal vein is patent. Spleen is enlarged measuring 19 cm. Pancreas and bilateral adrenal glands are unremarkable. Kidneys enhance symmetrically with contrast. Normal caliber abdominal aorta.  Multiple prominent retroperitoneal lymph nodes measuring up to 1.1 cm (image 37; series 2). Urinary bladder is unremarkable. Prostate unremarkable. Sigmoid colonic diverticulosis. No evidence for acute diverticulitis wall thickening of the cecum and ascending colon, likely secondary to portal collapse of the. Re- cannulization of the paraumbilical vein.  Postoperative changes within the right inguinal region. There is a large amount of fluid within the right inguinal region extending into the scrotum through the inguinal canal. High attenuation within the right hemiscrotum may represent 2 blood products. Overlying soft tissue edema.  No aggressive or acute appearing osseous lesions.  IMPRESSION: 1. Postoperative changes within the right inguinal region compatible with recent right inguinal hernia repair. There is a large amount of fluid extending through the inguinal canal into the right hemiscrotum which is distended with overlying soft tissue edema. High attenuation within the right hemiscrotum may represent acute blood products. Recommend correlation with ultrasound. 2. Multiple nonspecific prominent retroperitoneal lymph nodes. Recommend correlation with laboratory analysis. Additionally a followup CT in 3 months may prove helpful to ensure stability. 3. Morphologic changes to the liver compatible with cirrhosis and sequelae of portal venous hypertension including splenomegaly. 4. Moderate bilateral pleural effusions. Underlying opacities suggestive of atelectasis.   Electronically Signed   By: Annia Belt M.D.   On: 08/28/2013 21:57    Anti-infectives: Anti-infectives   Start  Dose/Rate Route Frequency Ordered Stop   08/29/13 0400  piperacillin-tazobactam (ZOSYN) IVPB 3.375 g     3.375 g 12.5 mL/hr over 240  Minutes Intravenous Every 8 hours 08/29/13 0347     08/29/13 0400  vancomycin (VANCOCIN) IVPB 1000 mg/200 mL premix     1,000 mg 200 mL/hr over 60 Minutes Intravenous Every 8 hours 08/29/13 0347     08/28/13 1400  piperacillin-tazobactam (ZOSYN) IVPB 4.5 g  Status:  Discontinued     4.5 g 200 mL/hr over 30 Minutes Intravenous  Once 08/28/13 1352 08/28/13 1356   08/28/13 1400  vancomycin (VANCOCIN) IVPB 1000 mg/200 mL premix     1,000 mg 200 mL/hr over 60 Minutes Intravenous  Once 08/28/13 1352 08/28/13 1548   08/28/13 1400  piperacillin-tazobactam (ZOSYN) IVPB 3.375 g     3.375 g 100 mL/hr over 30 Minutes Intravenous  Once 08/28/13 1357 08/28/13 1547      Assessment/Plan: Patient Active Problem List   Diagnosis Date Noted  . Acute blood loss anemia 08/29/2013  . Severe sepsis 08/29/2013  . possible HCAP (healthcare-associated pneumonia) 08/29/2013  . Leukocytosis, unspecified 08/29/2013  . Thrombocytopenia 08/29/2013  . Hyponatremia 08/29/2013  . possible encephalopathy, hepatic 08/29/2013  . Post-operative complication 08/28/2013  . Hematoma 08/28/2013  . Coagulopathy 08/28/2013  s/p RIH in [place] 8 days ago with post op hematoma No change in management Follow H/H and PT daily Correct coags and transfuse as needed. No role for surgery at this point.  Will follow   LOS: 2 days    Avonlea Sima A. 08/30/2013

## 2013-08-30 NOTE — Progress Notes (Addendum)
[facility] PROGRESS NOTE  Xavier Valentine ZOX:[id number] DOB: 1960/09/25 DOA: 08/28/2013 PCP: No PCP Per Patient  Assessment/Plan  Right inguinal and abdominal Hematoma / Post-operative complication due to coagulopathy due to cirrhosis, continuing to have bleeding but appears to be slowing -  given Vit K in ER -  Cryoprecipitate and FFP given 8/27 -  Appreciate general surgery recommendations -  Still having oozing bleeding from dressing -  Discussed with Dr. Truett Perna:  Recommends amicar + repeating vitamin K  Severe sepsis (fever, leukocytosis, low blood pressure) due to possible HCAP, although asymptomatic vs. Infected hematoma.  Blood pressure improved -  blood cultures NGTD -  Urine culture neg -  Continue vancomycin and zosyn day 3  Acute hypoxic respiratory failure likely secondary to effusions/pulmonary edema from IVF and hypoalbuminemia -  D/c IVF -  Lasix  IV once -  Start prn duonebs for comfort -  Repeat CXR  Nausea, vomiting, and diarrhea -  C. Diff  -  Enteric precautions -  Repeat LFTs and lipase -  Antiemetics  -  Start IV PPI -  CLD and advance as tolerated -  Monitor for bleeding  Cirrhosis of liver - Hep C and Alcohol induce with resultant thrombocytopenia, coagulopathy, ascites, elevated bili, low albumin -  Start diuretics -  Unclear if hx of varices -  D/c tylenol/minimize hepatotoxins  Ascites, not tense and non-tender  Slow speech/foggy thinking concerning for possible hepatic encephalopathy -  D/c lactulose due to diarrhea  ETOH abuse -  CIWA protocol -  Thiamine, folate, MVI  Cocaine positive  - avoid B blockers - discussed health complications of Cocaine use with patient.   Leukocytosis, possibly due to pneumonia, infected hematoma, UA with rare WBC.   -  Abx as above -  Repeat WBC in AM  Acute blood loss anemia with ongoing bleeding from incision site -  Repeat hgb this afternoon -  Transfuse to keep hgb > 8 given ongoing  bleeding  Progressive thrombocytopenia, likely due to cirrhosis and consumption from bleeding -  Transfuse to keep plt > 50K  Hypoalbuminemia, due to cirrhosis -  Maximize nutrition  Hyponatremia due to cirrhosis  - Urine sodium 9 and osmolality 737  Calcium corrects with albumin  Lactic acidosis is common in setting of cirrhosis  Cachexia, severe protein calorie malnutrition -  Liberalize diet -  Supplements -  Nutrition consultation  Diet:  CLD Access:  PIV IVF:  off Proph:  SCD  Code Status: full Family Communication: patient alone Disposition Plan: Continue in stepdown for now.  Patient has poor insight into condition.     Consultants:  Gen surg  Hematology  Procedures:  CT ab/p  Antibiotics:  vanc 8/27 >>  Zosyn 8/27 >>   HPI/Subjective:  Still bleeding and now having vomiting and diarrhea.  Did not sleep well last night.  Has scrotal pressure.     Objective: Filed Vitals:   08/30/13 0400 08/30/13 0500 08/30/13 0600 08/30/13 0700  BP: 123/62 115/97 128/77 132/86  Pulse: 89 84 86 84  Temp:      TempSrc:      Resp: 33 Height:      Weight:      SpO2: 95% 93% 92% 91%    Intake/TAVEN STRITEry (Last 24 hours) at 08/30/13 0726 Last data filed at 08/30/13 0700  Gross per 24 hour  Intake   2520 ml  Output    500 ml  Net  2020 ml   Filed Weights   08/28/13 2338  Weight: 89.1 kg (196 lb 6.9 oz)    Exam:   General:  Cachectic WM, forced expirations, tachypnea, prolonged expiratory phase.  Jaundiced, speech is quicker today  HEENT:  NCAT, MMM, scleral icterus   Cardiovascular:  RRR, nl S1, S2 no mrg, 2+ pulses, warm extremities  Respiratory:  Full expiratory wheeze with diminished BS at the bases, no focal rales or rhonchi  Abdomen:   NABS, soft, mildly distended, NT  GU:  Penis and scrotum swollen/ecchymotic, but not erythematous  MSK:   Normal tone and bulk, 1+ bilateral LEE  Neuro:  Grossly moves all  extremities  Skin:  Ecchymoses start below umbilicus and extend along perineum and down medial aspect of both legs to feet.  Staple line with active bleeding near the medial border   Data Reviewed: Basic Metabolic Panel:  Recent Labs Lab 08/28/13 1357 08/29/13 0420  NA 127* 127*  K 4.0 3.9  CL 90* 95*  CO2 20 22  GLUCOSE 88 95  BUN 14 16  CREATININE 0.76 0.77  CALCIUM 7.4* 6.8*  MG 1.7  --   PHOS 3.1  --    Liver Function Tests:  Recent Labs Lab 08/28/13 1357 08/29/13 0420  AST 46* 33  ALT 28 21  ALKPHOS 100 77  BILITOT 8.6* 7.8*  PROT 6.9 5.9*  ALBUMIN 2.0* 1.8*    Recent Labs Lab 08/28/13 1357  LIPASE 26    Recent Labs Lab 08/28/13 1357  AMMONIA 60   CBC:  Recent Labs Lab 08/28/13 1357 08/28/13 1808 08/29/13 0420 08/29/13 1447  WBC 23.7*  --  12.2* 8.2  NEUTROABS 18.3*  --  8.9*  --   HGB 10.7*  --  8.8* 8.7*  HCT 30.7*  --  25.2* 25.7*  MCV 98.1  --  97.3 100.4*  PLT 139* 93* 77* 70*   Cardiac Enzymes: No results found for this basename: CKTOTAL, CKMB, CKMBINDEX, TROPONINI,  in the last 168 hours BNP (last 3 results)  Recent Labs  08/28/13 1357  PROBNP 739.8*   CBG: No results found for this basename: GLUCAP,  in the last 168 hours  Recent Results (from the past 240 hour(s))  CULTURE, BLOOD (ROUTINE X 2)     Status: None   Collection Time    08/28/13  1:57 PM      Result Value Ref Range Status   Specimen Description BLOOD LEFT ANTECUBITAL   Final   Special Requests BOTTLES DRAWN AEROBIC AND ANAEROBIC   Final   Culture  Setup Time     Final   Value: 08/28/2013 16:24     Performed at [facility]   Culture     Final   Value:        BLOOD CULTURE RECEIVED NO GROWTH TO DATE CULTURE WILL BE HELD FOR 5 DAYS BEFORE ISSUING A FINAL NEGATIVE REPORT     Performed at [facility]   Report Status PENDING   Incomplete  CULTURE, BLOOD (ROUTINE X 2)     Status: None   Collection Time    08/28/13  1:57 PM      Result  Value Ref Range Status   Specimen Description BLOOD BLOOD LEFT FOREARM   Final   Special Requests BOTTLES DRAWN AEROBIC AND ANAEROBIC   Final   Culture  Setup Time     Final   Value: 08/28/2013 16:24     Performed at  [facility]     Final   Value:        BLOOD CULTURE RECEIVED NO GROWTH TO DATE CULTURE WILL BE HELD FOR 5 DAYS BEFORE ISSUING A FINAL NEGATIVE REPORT     Performed at [facility]   Report Status PENDING   Incomplete  URINE CULTURE     Status: None   Collection Time    08/28/13  3:41 PM      Result Value Ref Range Status   Specimen Description URINE, CLEAN CATCH   Final   Special Requests NONE   Final   Culture  Setup Time     Final   Value: 08/28/2013 23:33     Performed at [facility]     Final   Value: 9,000 COLONIES/ML     Performed at [facility]   Culture     Final   Value: INSIGNIFICANT GROWTH     Performed at [facility]   Report Status 08/30/2013 FINAL   Final  MRSA PCR SCREENING     Status: Abnormal   Collection Time    08/29/13  6:11 PM      Result Value Ref Range Status   MRSA by PCR POSITIVE (*) NEGATIVE Final   Comment:            The GeneXpert MRSA Assay (FDA     approved for NASAL specimens     only), is one component of a     comprehensive MRSA colonization     surveillance program. It is not     intended to diagnose MRSA     infection nor to guide or     monitor treatment for     MRSA infections.     RESULT CALLED TO, READ BACK BY AND VERIFIED WITH:     [facility] SHERYL RN 2050 08/29/2013 BY BOVELL,T.     Studies: Ct Abdomen Pelvis W Contrast  08/28/2013   CLINICAL DATA:  Right inguinal repair 1 week prior. Drainage from incision.  EXAM: CT ABDOMEN AND PELVIS WITH CONTRAST  TECHNIQUE: Multidetector CT imaging of the abdomen and pelvis was performed using the standard protocol following bolus administration of intravenous contrast.  CONTRAST:  OMNIPAQUE IOHEXOL 300  MG/ML SOLN, 50mL OMNIPAQUE IOHEXOL 300 MG/ML SOLN  COMPARISON:  None.  FINDINGS: Moderate bilateral pleural effusions. Consolidative opacity within the right and left lower lobes.  The liver is small and nodular in contour compatible with cirrhosis. There is a 13 mm low-attenuation lesion within the hepatic dome, suggestive of a cyst. Gallbladder is mildly distended. Portal vein is patent. Spleen is enlarged measuring 19 cm. Pancreas and bilateral adrenal glands are unremarkable. Kidneys enhance symmetrically with contrast. Normal caliber abdominal aorta.  Multiple prominent retroperitoneal lymph nodes measuring up to 1.1 cm (image 37; series 2). Urinary bladder is unremarkable. Prostate unremarkable. Sigmoid colonic diverticulosis. No evidence for acute diverticulitis wall thickening of the cecum and ascending colon, likely secondary to portal collapse of the. Re- cannulization of the paraumbilical vein.  Postoperative changes within the right inguinal region. There is a large amount of fluid within the right inguinal region extending into the scrotum through the inguinal canal. High attenuation within the right hemiscrotum may represent 2 blood products. Overlying soft tissue edema.  No aggressive or acute appearing osseous lesions.  IMPRESSION: 1. Postoperative changes within the right inguinal region compatible with recent right inguinal hernia repair. There is  a large amount of fluid extending through the inguinal canal into the right hemiscrotum which is distended with overlying soft tissue edema. High attenuation within the right hemiscrotum may represent acute blood products. Recommend correlation with ultrasound. 2. Multiple nonspecific prominent retroperitoneal lymph nodes. Recommend correlation with laboratory analysis. Additionally a followup CT in 3 months may prove helpful to ensure stability. 3. Morphologic changes to the liver compatible with cirrhosis and sequelae of portal venous hypertension  including splenomegaly. 4. Moderate bilateral pleural effusions. Underlying opacities suggestive of atelectasis.   Electronically Signed   By: Annia Belt M.D.   On: 08/28/2013 21:57    Scheduled Meds: . Chlorhexidine Gluconate Cloth  6 each Topical q morning - 10a  . feeding supplement (ENSURE COMPLETE)  237 mL Oral TID BM  . folic acid  1 mg Oral Daily  . lactulose  30 g Oral Daily  . LORazepam  0-4 mg Intravenous Q6H   Followed by  . LORazepam  0-4 mg Intravenous Q12H  . multivitamin with minerals  1 tablet Oral Daily  . mupirocin ointment  1 application Nasal BID  . piperacillin-tazobactam (ZOSYN)  IV  3.375 g Intravenous Q8H  . thiamine  100 mg Oral Daily   Or  . thiamine  100 mg Intravenous Daily  . vancomycin  1,000 mg Intravenous Q8H   Continuous Infusions: . sodium chloride 100 mL/hr at 08/29/13 1544    Principal Problem:   Severe sepsis Active Problems:   Post-operative complication   Hematoma   Coagulopathy   Acute blood loss anemia   possible HCAP (healthcare-associated pneumonia)   Leukocytosis, unspecified   Thrombocytopenia   Hyponatremia   possible encephalopathy, hepatic    Time spent: 30 min    Annmargaret Decaprio, [facility]  [facility] Pager [phone]. If 7PM-7AM, please contact night-coverage at [email or web address], password [facility] 08/30/2013, 7:26 AM  LOS: 2 days

## 2013-08-30 NOTE — Progress Notes (Signed)
ANTIBIOTIC CONSULT NOTE - FOLLOW UP  Pharmacy Consult for Vancomycin/Zosyn Indication: sepsis  Allergies  Allergen Reactions  . Bee Venom Swelling    Patient Measurements: Height:  (180.3 cm) Weight: 196 lb 6.9 oz (89.1 kg) IBW/kg (Calculated) : 75.3 Adjusted Body Weight:   Vital Signs: Temp: 98.2 F (36.8 C) (08/29 1219) Temp src: Oral (08/29 1219) BP: 122/65 mmHg (08/29 1400) Pulse Rate: 76 (08/29 1400) Intake/Output from previous day: 08/28 0701 - 08/29 0700 In: 2520 [P.O.:720; I.V.:1300; IV Piggyback:500] Out: 500 [Urine:500] Intake/Output from this shift: Total I/O In: 725 [P.O.:240; I.V.:85; IV Piggyback:400] Out: -   Labs:  Recent Labs  08/28/13 1357  08/29/13 0420 08/29/13 1447 08/30/13 0745  WBC 23.7*  --  12.2* 8.2 14.1*  HGB 10.7*  --  8.8* 8.7* 9.7*  PLT 139*  < > 77* 70* 103*  CREATININE 0.76  --  0.77  --  0.92  < > = values in this interval not displayed. Estimated Creatinine Clearance: 96.6 ml/min (by C-G formula based on Cr of 0.92). No results found for this basename: VANCOTROUGH, VANCOPEAK, VANCORANDOM, GENTTROUGH, GENTPEAK, GENTRANDOM, TOBRATROUGH, TOBRAPEAK, TOBRARND, AMIKACINPEAK, AMIKACINTROU, AMIKACIN,  in the last 72 hours   Microbiology: Recent Results (from the past 720 hour(s))  CULTURE, BLOOD (ROUTINE X 2)     Status: None   Collection Time    08/28/13  1:57 PM      Result Value Ref Range Status   Specimen Description BLOOD LEFT ANTECUBITAL   Final   Special Requests BOTTLES DRAWN AEROBIC AND ANAEROBIC   Final   Culture  Setup Time     Final   Value: 08/28/2013 16:24     Performed at [facility]   Culture     Final   Value:        BLOOD CULTURE RECEIVED NO GROWTH TO DATE CULTURE WILL BE HELD FOR 5 DAYS BEFORE ISSUING A FINAL NEGATIVE REPORT     Performed at [facility]   Report Status PENDING   Incomplete  CULTURE, BLOOD (ROUTINE X 2)     Status: None   Collection Time    08/28/13  1:57 PM   Result Value Ref Range Status   Specimen Description BLOOD BLOOD LEFT FOREARM   Final   Special Requests BOTTLES DRAWN AEROBIC AND ANAEROBIC   Final   Culture  Setup Time     Final   Value: 08/28/2013 16:24     Performed at [facility]   Culture     Final   Value:        BLOOD CULTURE RECEIVED NO GROWTH TO DATE CULTURE WILL BE HELD FOR 5 DAYS BEFORE ISSUING A FINAL NEGATIVE REPORT     Performed at [facility]   Report Status PENDING   Incomplete  URINE CULTURE     Status: None   Collection Time    08/28/13  3:41 PM      Result Value Ref Range Status   Specimen Description URINE, CLEAN CATCH   Final   Special Requests NONE   Final   Culture  Setup Time     Final   Value: 08/28/2013 23:33     Performed at [facility] Count     Final   Value: 9,000 COLONIES/ML     Performed at [facility]   Culture     Final   Value: INSIGNIFICANT GROWTH     Performed  at [facility]   Report Status 08/30/2013 FINAL   Final  MRSA PCR SCREENING     Status: Abnormal   Collection Time    08/29/13  6:11 PM      Result Value Ref Range Status   MRSA by PCR POSITIVE (*) NEGATIVE Final   Comment:            The GeneXpert MRSA Assay (FDA     approved for NASAL specimens     only), is one component of a     comprehensive MRSA colonization     surveillance program. It is not     intended to diagnose MRSA     infection nor to guide or     monitor treatment for     MRSA infections.     RESULT CALLED TO, READ BACK BY AND VERIFIED WITH:     [facility] SHERYL RN 2050 08/29/2013 BY BOVELL,T.  CLOSTRIDIUM DIFFICILE BY PCR     Status: None   Collection Time    08/30/13  4:18 AM      Result Value Ref Range Status   C difficile by pcr NEGATIVE  NEGATIVE Final   Comment: Performed at [facility]    Anti-infectives   Start     Dose/Rate Route Frequency Ordered Stop   08/29/13 0400  piperacillin-tazobactam (ZOSYN) IVPB 3.375 g     3.375 g 12.5  mL/hr over 240 Minutes Intravenous Every 8 hours 08/29/13 0347     08/29/13 0400  vancomycin (VANCOCIN) IVPB 1000 mg/200 mL premix     1,000 mg 200 mL/hr over 60 Minutes Intravenous Every 8 hours 08/29/13 0347     08/28/13 1400  piperacillin-tazobactam (ZOSYN) IVPB 4.5 g  Status:  Discontinued     4.5 g 200 mL/hr over 30 Minutes Intravenous  Once 08/28/13 1352 08/28/13 1356   08/28/13 1400  vancomycin (VANCOCIN) IVPB 1000 mg/200 mL premix     1,000 mg 200 mL/hr over 60 Minutes Intravenous  Once 08/28/13 1352 08/28/13 1548   08/28/13 1400  piperacillin-tazobactam (ZOSYN) IVPB 3.375 g     3.375 g 100 mL/hr over 30 Minutes Intravenous  Once 08/28/13 1357 08/28/13 1547      Assessment: 53yo M with past medical h/o of ETOH abuse and Hep C who underwent a right inguinal hernia repair in [place] 1 wk ago. He was doing well post op until 2 days ago when he states the wound began to bleed. Vancomycin and Zosyn per Rx for leukocytosis post procedure.   8/27 >> Zosyn >> 8/27 >> Vanc >>   Tmax: AF WBCs: elev but improved Renal: SCr WNL, CrCl 92N, >100CG  8/27 blood: ngtd 8/27 urine: insignificant growth 8/28 MRSA screen (+) 8/29 Cdiff PCR: neg  Today is day #2 Zosyn EI and Vanc 1g q8h for sepsis possibly due to HCAP (infiltrates on CT) vs infected hematoma in pt s/p abdominal surgery ~1 week PTA. CT showed hematoma but no abscesses.   Goal of Therapy:  Vancomycin trough level 15-20 mcg/ml Doses adjusted per renal function Eradication of infection  Plan:  1.  Check vancomycin trough this evening and adjust dose as needed. 2.  Continue Zosyn 3.375g IV q8h (4 hour infusion time). 3.  F/u daily.   Clance Boll 08/30/2013,2:30 PM

## 2013-08-31 DIAGNOSIS — D696 Thrombocytopenia, unspecified: Secondary | ICD-10-CM

## 2013-08-31 LAB — PROTIME-INR
INR: 1.97 — AB (ref 0.00–1.49)
Prothrombin Time: 22.4 seconds — ABNORMAL HIGH (ref 11.6–15.2)

## 2013-08-31 LAB — COMPREHENSIVE METABOLIC PANEL
ALT: 20 U/L (ref 0–53)
ANION GAP: 8 (ref 5–15)
AST: 34 U/L (ref 0–37)
Albumin: 1.5 g/dL — ABNORMAL LOW (ref 3.5–5.2)
Alkaline Phosphatase: 68 U/L (ref 39–117)
BILIRUBIN TOTAL: 5.8 mg/dL — AB (ref 0.3–1.2)
BUN: 15 mg/dL (ref 6–23)
CO2: 24 meq/L (ref 19–32)
Calcium: 6.8 mg/dL — ABNORMAL LOW (ref 8.4–10.5)
Chloride: 95 mEq/L — ABNORMAL LOW (ref 96–112)
Creatinine, Ser: 0.76 mg/dL (ref 0.50–1.35)
GLUCOSE: 89 mg/dL (ref 70–99)
Potassium: 3.3 mEq/L — ABNORMAL LOW (ref 3.7–5.3)
Sodium: 127 mEq/L — ABNORMAL LOW (ref 137–147)
TOTAL PROTEIN: 5.4 g/dL — AB (ref 6.0–8.3)

## 2013-08-31 LAB — CBC
HCT: 25.1 % — ABNORMAL LOW (ref 39.0–52.0)
HEMOGLOBIN: 8.8 g/dL — AB (ref 13.0–17.0)
MCH: 33.7 pg (ref 26.0–34.0)
MCHC: 35.1 g/dL (ref 30.0–36.0)
MCV: 96.2 fL (ref 78.0–100.0)
PLATELETS: 84 10*3/uL — AB (ref 150–400)
RBC: 2.61 MIL/uL — ABNORMAL LOW (ref 4.22–5.81)
RDW: 18.9 % — AB (ref 11.5–15.5)
WBC: 8.5 10*3/uL (ref 4.0–10.5)

## 2013-08-31 MED ORDER — FUROSEMIDE 10 MG/ML IJ SOLN
20.0000 mg | Freq: Once | INTRAMUSCULAR | Status: AC
  Administered 2013-08-31: 20 mg via INTRAVENOUS
  Filled 2013-08-31: qty 2

## 2013-08-31 MED ORDER — PANTOPRAZOLE SODIUM 40 MG PO TBEC
40.0000 mg | DELAYED_RELEASE_TABLET | Freq: Two times a day (BID) | ORAL | Status: DC
  Administered 2013-08-31 – 2013-09-10 (×20): 40 mg via ORAL
  Filled 2013-08-31 (×21): qty 1

## 2013-08-31 MED ORDER — PHYTONADIONE 5 MG PO TABS
5.0000 mg | ORAL_TABLET | Freq: Every day | ORAL | Status: DC
  Administered 2013-08-31 – 2013-09-08 (×9): 5 mg via ORAL
  Filled 2013-08-31 (×9): qty 1

## 2013-08-31 MED ORDER — POTASSIUM CHLORIDE CRYS ER 20 MEQ PO TBCR
40.0000 meq | EXTENDED_RELEASE_TABLET | Freq: Once | ORAL | Status: AC
  Administered 2013-08-31: 40 meq via ORAL
  Filled 2013-08-31: qty 2

## 2013-08-31 MED ORDER — LIP MEDEX EX OINT
TOPICAL_OINTMENT | CUTANEOUS | Status: DC | PRN
  Administered 2013-08-31: 16:00:00 via TOPICAL
  Filled 2013-08-31: qty 7

## 2013-08-31 NOTE — Progress Notes (Signed)
Subjective: Pt resting  Objective: Vital signs in last 24 hours: Temp:  [98.1 F (36.7 C)-99 F (37.2 C)] 98.1 F (36.7 C) (08/30 0400) Pulse Rate:  [67-84] 73 (08/30 0200) Resp:  [16-33] 25 (08/30 0200) BP: (92-147)/(43-108) 92/50 mmHg (08/30 0200) SpO2:  [85 %-98 %] 93 % (08/30 0200) Weight:  [213 lb 3 oz (96.7 kg)] 213 lb 3 oz (96.7 kg) (08/30 0200) Last BM Date: 08/30/13  Intake/Output from previous day: 08/29 0701 - 08/30 0700 In: 2145 [P.O.:840; I.V.:155; IV Piggyback:1150] Out: 3600 [Urine:3600] Intake/Output this shift:    Incision/Wound:right groin/flank/lower abdominal wall ecchymosis stable.  Staples in place. Min serous drainage.  Lab Results:   Recent Labs  08/30/13 0745 08/31/13 0334  WBC 14.1* 8.5  HGB 9.7* 8.8*  HCT 28.6* 25.1*  PLT 103* 84*   BMET  Recent Labs  08/30/13 0745 08/31/13 0334  NA 127* 127*  K 3.6* 3.3*  CL 93* 95*  CO2 22 24  GLUCOSE 158* 89  BUN 24* 15  CREATININE 0.92 0.76  CALCIUM 6.9* 6.8*   PT/INR  Recent Labs  08/30/13 0745 08/31/13 0334  LABPROT 22.8* 22.4*  INR 2.01* 1.97*   ABG No results found for this basename: PHART, PCO2, PO2, HCO3,  in the last 72 hours  Studies/Results: Dg Chest Port 1 View  08/30/2013   CLINICAL DATA:  Shortness of breath  EXAM: PORTABLE CHEST - 1 VIEW  COMPARISON:  None.  FINDINGS: Cardiac shadow is at the upper limits of normal in size. Increased density is noted in the left lung base consistent with the previously seen atelectasis and pleural effusion. The previously seen right-sided pleural effusion is less well appreciated on this exam. No other focal abnormality is noted.  IMPRESSION: Left basilar atelectasis and pleural effusion.   Electronically Signed   By: Alcide Clever M.D.   On: 08/30/2013 08:17    Anti-infectives: Anti-infectives   Start     Dose/Rate Route Frequency Ordered Stop   08/30/13 2200  rifaximin (XIFAXAN) tablet 550 mg     550 mg Oral 2 times daily 08/30/13  1906     08/29/13 0400  piperacillin-tazobactam (ZOSYN) IVPB 3.375 g     3.375 g 12.5 mL/hr over 240 Minutes Intravenous Every 8 hours 08/29/13 0347     08/29/13 0400  vancomycin (VANCOCIN) IVPB 1000 mg/200 mL premix     1,000 mg 200 mL/hr over 60 Minutes Intravenous Every 8 hours 08/29/13 0347     08/28/13 1400  piperacillin-tazobactam (ZOSYN) IVPB 4.5 g  Status:  Discontinued     4.5 g 200 mL/hr over 30 Minutes Intravenous  Once 08/28/13 1352 08/28/13 1356   08/28/13 1400  vancomycin (VANCOCIN) IVPB 1000 mg/200 mL premix     1,000 mg 200 mL/hr over 60 Minutes Intravenous  Once 08/28/13 1352 08/28/13 1548   08/28/13 1400  piperacillin-tazobactam (ZOSYN) IVPB 3.375 g     3.375 g 100 mL/hr over 30 Minutes Intravenous  Once 08/28/13 1357 08/28/13 1547      Assessment/Plan: Patient Active Problem List   Diagnosis Date Noted  . Acute blood loss anemia 08/29/2013  . Severe sepsis 08/29/2013  . possible HCAP (healthcare-associated pneumonia) 08/29/2013  . Leukocytosis, unspecified 08/29/2013  . Thrombocytopenia 08/29/2013  . Hyponatremia 08/29/2013  . possible encephalopathy, hepatic 08/29/2013  . Post-operative complication 08/28/2013  . Hematoma 08/28/2013  . Coagulopathy 08/28/2013  right groin/ retroperitoneal hematoma 9 days after open RIH in [place] Stable H/H and exam   LOS:  3 days    Edina Winningham A. 08/31/2013

## 2013-08-31 NOTE — Progress Notes (Signed)
[facility] PROGRESS NOTE  Xavier Valentine ZOX:[id number] DOB: [birth year] DOA: 08/28/2013 PCP: No PCP Per Patient  Assessment/Plan  Right inguinal and abdominal Hematoma / Post-operative complication due to coagulopathy due to cirrhosis, continuing to have bleeding but appears to be slowing -  Start daily vitamin K -  Cryoprecipitate and FFP given 8/27 -  Appreciate general surgery recommendations -  Still having oozing bleeding from dressing -  Started amicar on 8/29   Severe sepsis (fever, leukocytosis, low blood pressure) due to possible HCAP, although asymptomatic vs. Infected hematoma.  Blood pressure improved -  blood cultures NGTD -  Urine culture neg -  Continue vancomycin and zosyn day 4 of 7  Acute hypoxic respiratory failure likely secondary to effusions/pulmonary edema from IVF and hypoalbuminemia -  Lasix  IV once -  Continue prn duonebs for comfort  Nausea, vomiting, and diarrhea, LFTs and lipase stable -  C. Diff negative -  Antiemetics  -  Continue PPI:  Change to PO -  Advance diet as tolerated  Cirrhosis of liver - Hep C and Alcohol induce with resultant thrombocytopenia, coagulopathy, ascites, elevated bili, low albumin -  Lasix as blood pressure tolerates -  Unclear if hx of varices -  minimize hepatotoxins  Ascites, not tense and non-tender  Slow speech/foggy thinking concerning for possible hepatic encephalopathy -  lactulose caused diarrhea -  Start rifaximin  ETOH abuse with increased withdrawal symptoms  -  CIWA protocol increased to q2h yesterday and will need to continue today -  Thiamine, folate, MVI  Cocaine positive  - avoid B blockers - discussed health complications of Cocaine use with patient.   Leukocytosis, possibly due to pneumonia, infected hematoma, UA with rare WBC.  Resolved.  Acute blood loss anemia with ongoing bleeding from incision site.  Hgb approximately stable. -  Transfuse to keep hgb > 8 given ongoing  bleeding  Thrombocytopenia, likely due to cirrhosis and consumption from bleeding -  Transfuse to keep plt > 50K  Hypoalbuminemia, due to cirrhosis -  Maximize nutrition  Hyponatremia due to cirrhosis  - Urine sodium 9 and osmolality 737  Calcium corrects with albumin  Lactic acidosis is common in setting of cirrhosis  Cachexia, severe protein calorie malnutrition -  Liberalize diet -  Supplements -  Nutrition consultation  Diet:  Low salt, 1.2L fluid Access:  PIV IVF:  off Proph:  SCD  Code Status: full Family Communication: patient alone Disposition Plan: Continue in stepdown for now.  Patient has poor insight into condition.     Consultants:  Gen surg  Hematology  Procedures:  CT ab/p  Antibiotics:  vanc 8/27 >>  Zosyn 8/27 >>   HPI/Subjective:  Still bleeding, dressing changed 3 times overnight.  No further vomiting and diarrhea since yesterday.  Agitated overnight.  Still coughing and SOB.       Objective: Filed Vitals:   08/31/13 0000 08/31/13 0038 08/31/13 0200 08/31/13 0400  BP: 102/69  92/50   Pulse: 76  73   Temp: 99 F (37.2 C)   98.1 F (36.7 C)  TempSrc: Axillary   Axillary  Resp: 21  25   Height:      Weight:   96.7 kg (213 lb 3 oz)   SpO2: 96% 96% 93%     Intake/Output Summary (Last 24 hours) at 08/31/13 0748 Last data filed at 08/31/13 0500  Gross per 24 hour  Intake   2145 ml  Output   3600 ml  Net  -  1455 ml   Filed Weights   08/28/13 2338 08/31/13 0200  Weight: 89.1 kg (196 lb 6.9 oz) 96.7 kg (213 lb 3 oz)    Exam:   General:  Cachectic WM, forced expirations, tachypnea, prolonged expiratory phase.  Jaundiced, lethargic today (received ativan recently)  HEENT:  NCAT, MMM, scleral icterus   Cardiovascular:  RRR, nl S1, S2 no mrg, 2+ pulses, warm extremities  Respiratory:  Full expiratory wheeze with diminished BS at the bases, no focal rales or rhonchi  Abdomen:   NABS, soft, mildly distended, NT  GU:  Penis  and scrotum swollen/ecchymotic, but not erythematous  MSK:   Normal tone and bulk, 1+ bilateral LEE  Neuro:  Grossly moves all extremities  Skin:  Left dressing in place and held pressure for 15 minutes today.   Data Reviewed: Basic Metabolic Panel:  Recent Labs Lab 08/28/13 1357 08/29/13 0420 08/30/13 0745 08/31/13 0334  NA 127* 127* 127* 127*  K 4.0 3.9 3.6* 3.3*  CL 90* 95* 93* 95*  CO2 GLUCOSE 88 95 158* 89  BUN 14 16 24* 15  CREATININE 0.76 0.77 0.92 0.76  CALCIUM 7.4* 6.8* 6.9* 6.8*  MG 1.7  --   --   --   PHOS 3.1  --   --   --    Liver Function Tests:  Recent Labs Lab 08/28/13 1357 08/29/13 0420 08/30/13 0745 08/31/13 0334  AST 46* 33 40* 34  ALT ALKPHOS 100 77 97 68  BILITOT 8.6* 7.8* 6.3* 5.8*  PROT 6.9 5.9* 6.4 5.4*  ALBUMIN 2.0* 1.8* 1.8* 1.5*    Recent Labs Lab 08/28/13 1357 08/30/13 0745  LIPASE 26 33    Recent Labs Lab 08/28/13 1357  AMMONIA 60   CBC:  Recent Labs Lab 08/28/13 1357 08/28/13 1808 08/29/13 0420 08/29/13 1447 08/30/13 0745 08/31/13 0334  WBC 23.7*  --  12.2* 8.2 14.1* 8.5  NEUTROABS 18.3*  --  8.9*  --   --   --   HGB 10.7*  --  8.8* 8.7* 9.7* 8.8*  HCT 30.7*  --  25.2* 25.7* 28.6* 25.1*  MCV 98.1  --  97.3 100.4* 99.0 96.2  PLT 139* 93* 77* 70* 103* 84*   Cardiac Enzymes: No results found for this basename: CKTOTAL, CKMB, CKMBINDEX, TROPONINI,  in the last 168 hours BNP (last 3 results)  Recent Labs  08/28/13 1357  PROBNP 739.8*   CBG: No results found for this basename: GLUCAP,  in the last 168 hours  Recent Results (from the past 240 hour(s))  CULTURE, BLOOD (ROUTINE X 2)     Status: None   Collection Time    08/28/13  1:57 PM      Result Value Ref Range Status   Specimen Description BLOOD LEFT ANTECUBITAL   Final   Special Requests BOTTLES DRAWN AEROBIC AND ANAEROBIC   Final   Culture  Setup Time     Final   Value: 08/28/2013 16:24     Performed at [facility]   Culture     Final   Value:        BLOOD CULTURE RECEIVED NO GROWTH TO DATE CULTURE WILL BE HELD FOR 5 DAYS BEFORE ISSUING A FINAL NEGATIVE REPORT     Performed at [facility]   Report Status PENDING   Incomplete  CULTURE, BLOOD (ROUTINE X 2)     Status: None   Collection  Time    08/28/13  1:57 PM      Result Value Ref Range Status   Specimen Description BLOOD BLOOD LEFT FOREARM   Final   Special Requests BOTTLES DRAWN AEROBIC AND ANAEROBIC   Final   Culture  Setup Time     Final   Value: 08/28/2013 16:24     Performed at [facility]   Culture     Final   Value:        BLOOD CULTURE RECEIVED NO GROWTH TO DATE CULTURE WILL BE HELD FOR 5 DAYS BEFORE ISSUING A FINAL NEGATIVE REPORT     Performed at [facility]   Report Status PENDING   Incomplete  URINE CULTURE     Status: None   Collection Time    08/28/13  3:41 PM      Result Value Ref Range Status   Specimen Description URINE, CLEAN CATCH   Final   Special Requests NONE   Final   Culture  Setup Time     Final   Value: 08/28/2013 23:33     Performed at [facility] Count     Final   Value: 9,000 COLONIES/ML     Performed at [facility]   Culture     Final   Value: INSIGNIFICANT GROWTH     Performed at [facility]   Report Status 08/30/2013 FINAL   Final  MRSA PCR SCREENING     Status: Abnormal   Collection Time    08/29/13  6:11 PM      Result Value Ref Range Status   MRSA by PCR POSITIVE (*) NEGATIVE Final   Comment:            The GeneXpert MRSA Assay (FDA     approved for NASAL specimens     only), is one component of a     comprehensive MRSA colonization     surveillance program. It is not     intended to diagnose MRSA     infection nor to guide or     monitor treatment for     MRSA infections.     RESULT CALLED TO, READ BACK BY AND VERIFIED WITH:     [facility] SHERYL RN 2050 08/29/2013 BY BOVELL,T.  CLOSTRIDIUM DIFFICILE BY PCR     Status:  None   Collection Time    08/30/13  4:18 AM      Result Value Ref Range Status   C difficile by pcr NEGATIVE  NEGATIVE Final   Comment: Performed at [facility]     Studies: Dg Chest Port 1 View  08/30/2013   CLINICAL DATA:  Shortness of breath  EXAM: PORTABLE CHEST - 1 VIEW  COMPARISON:  None.  FINDINGS: Cardiac shadow is at the upper limits of normal in size. Increased density is noted in the left lung base consistent with the previously seen atelectasis and pleural effusion. The previously seen right-sided pleural effusion is less well appreciated on this exam. No other focal abnormality is noted.  IMPRESSION: Left basilar atelectasis and pleural effusion.   Electronically Signed   By: Alcide Clever M.D.   On: 08/30/2013 08:17    Scheduled Meds: . aminocaproic acid  1 g Oral 4 times per day  . Chlorhexidine Gluconate Cloth  6 each Topical q morning - 10a  . feeding supplement (ENSURE COMPLETE)  237 mL Oral TID BM  . folic acid  1 mg  Oral Daily  . LORazepam  0-4 mg Intravenous Q2H while awake  . multivitamin with minerals  1 tablet Oral Daily  . mupirocin ointment  1 application Nasal BID  . pantoprazole (PROTONIX) IV  40 mg Intravenous BID  . piperacillin-tazobactam (ZOSYN)  IV  3.375 g Intravenous Q8H  . potassium chloride  40 mEq Oral Once  . rifaximin  550 mg Oral BID  . thiamine  100 mg Oral Daily   Or  . thiamine  100 mg Intravenous Daily  . vancomycin  1,000 mg Intravenous Q8H   Continuous Infusions:    Principal Problem:   Severe sepsis Active Problems:   Post-operative complication   Hematoma   Coagulopathy   Acute blood loss anemia   possible HCAP (healthcare-associated pneumonia)   Leukocytosis, unspecified   Thrombocytopenia   Hyponatremia   possible encephalopathy, hepatic    Time spent: 30 min    Makayla Lanter, [facility]  [facility] Pager [phone]. If 7PM-7AM, please contact night-coverage at [email or web address], password [facility] 08/31/2013, 7:48 AM   LOS: 3 days

## 2013-09-01 ENCOUNTER — Inpatient Hospital Stay (HOSPITAL_COMMUNITY): Payer: Medicaid - Out of State

## 2013-09-01 DIAGNOSIS — K7682 Hepatic encephalopathy: Secondary | ICD-10-CM

## 2013-09-01 DIAGNOSIS — I369 Nonrheumatic tricuspid valve disorder, unspecified: Secondary | ICD-10-CM

## 2013-09-01 DIAGNOSIS — K7290 Hepatic failure, unspecified without coma: Secondary | ICD-10-CM

## 2013-09-01 LAB — PREPARE FRESH FROZEN PLASMA
Unit division: 0
Unit division: 0

## 2013-09-01 LAB — CBC
HCT: 25.9 % — ABNORMAL LOW (ref 39.0–52.0)
Hemoglobin: 9 g/dL — ABNORMAL LOW (ref 13.0–17.0)
MCH: 33.6 pg (ref 26.0–34.0)
MCHC: 34.7 g/dL (ref 30.0–36.0)
MCV: 96.6 fL (ref 78.0–100.0)
PLATELETS: 97 10*3/uL — AB (ref 150–400)
RBC: 2.68 MIL/uL — ABNORMAL LOW (ref 4.22–5.81)
RDW: 19.1 % — AB (ref 11.5–15.5)
WBC: 8.6 10*3/uL (ref 4.0–10.5)

## 2013-09-01 LAB — COMPREHENSIVE METABOLIC PANEL
ALK PHOS: 82 U/L (ref 39–117)
ALT: 23 U/L (ref 0–53)
AST: 44 U/L — ABNORMAL HIGH (ref 0–37)
Albumin: 1.6 g/dL — ABNORMAL LOW (ref 3.5–5.2)
Anion gap: 11 (ref 5–15)
BUN: 13 mg/dL (ref 6–23)
CO2: 24 mEq/L (ref 19–32)
Calcium: 7.2 mg/dL — ABNORMAL LOW (ref 8.4–10.5)
Chloride: 95 mEq/L — ABNORMAL LOW (ref 96–112)
Creatinine, Ser: 0.8 mg/dL (ref 0.50–1.35)
GLUCOSE: 87 mg/dL (ref 70–99)
POTASSIUM: 3.7 meq/L (ref 3.7–5.3)
Sodium: 130 mEq/L — ABNORMAL LOW (ref 137–147)
Total Bilirubin: 6.1 mg/dL — ABNORMAL HIGH (ref 0.3–1.2)
Total Protein: 5.9 g/dL — ABNORMAL LOW (ref 6.0–8.3)

## 2013-09-01 LAB — PROTIME-INR
INR: 2.02 — ABNORMAL HIGH (ref 0.00–1.49)
Prothrombin Time: 22.9 seconds — ABNORMAL HIGH (ref 11.6–15.2)

## 2013-09-01 MED ORDER — MOMETASONE FURO-FORMOTEROL FUM 100-5 MCG/ACT IN AERO
2.0000 | INHALATION_SPRAY | Freq: Two times a day (BID) | RESPIRATORY_TRACT | Status: DC
  Administered 2013-09-01 – 2013-09-10 (×17): 2 via RESPIRATORY_TRACT
  Filled 2013-09-01: qty 8.8

## 2013-09-01 MED ORDER — FUROSEMIDE 40 MG PO TABS
40.0000 mg | ORAL_TABLET | Freq: Every day | ORAL | Status: DC
Start: 2013-09-01 — End: 2013-09-02
  Administered 2013-09-01: 40 mg via ORAL
  Filled 2013-09-01: qty 1

## 2013-09-01 MED ORDER — FUROSEMIDE 20 MG PO TABS: 20.0000 mg | ORAL_TABLET | Freq: Every day | ORAL | Status: DC

## 2013-09-01 MED ORDER — SODIUM CHLORIDE 0.9 % IV SOLN
INTRAVENOUS | Status: DC
  Administered 2013-09-02 – 2013-09-03 (×2): via INTRAVENOUS

## 2013-09-01 MED ORDER — SPIRONOLACTONE 25 MG PO TABS
12.5000 mg | ORAL_TABLET | Freq: Every day | ORAL | Status: DC
  Administered 2013-09-01: 12:00:00 via ORAL
  Filled 2013-09-01: qty 1

## 2013-09-01 MED ORDER — CALCIUM CARBONATE ANTACID 500 MG PO CHEW: 1.0000 | CHEWABLE_TABLET | ORAL | Status: DC | PRN

## 2013-09-01 MED ORDER — FUROSEMIDE 10 MG/ML IJ SOLN
40.0000 mg | Freq: Once | INTRAMUSCULAR | Status: AC
  Administered 2013-09-01: 40 mg via INTRAVENOUS

## 2013-09-01 MED ORDER — FUROSEMIDE 10 MG/ML IJ SOLN
INTRAMUSCULAR | Status: AC
  Administered 2013-09-01: 40 mg via INTRAVENOUS
  Filled 2013-09-01: qty 4

## 2013-09-01 NOTE — Progress Notes (Signed)
Subjective: He is on the bed pan and generally miserable, says he 's not had a Bm since surgery in [place].  He looks allot better than when he arrived.  Groin site looks good, some serosanguinous drainage.  The scrotal swelling is much better.  The ecchymosis is somewhat better.  Objective: Vital signs in last 24 hours: Temp:  [97.5 F (36.4 C)-98.6 F (37 C)] 98.4 F (36.9 C) (08/31 0828) Pulse Rate:  [67-77] 68 (08/31 0800) Resp:  [17-36] 32 (08/31 0400) BP: (77-126)/(36-85) 125/85 mmHg (08/31 0800) SpO2:  [89 %-100 %] 100 % (08/31 0821) Last BM Date: 08/31/13 Diet:  2 gram Na Nothing PO recorded yesterday. Afebrile, BP down to 88/46 last PM, better this AM Na is better, T bil down some but still 6.1 CBC is stable. INR still 2.20 C diff is negative Intake/Output from previous day: 08/30 0701 - 08/31 0700 In: 750 [IV Piggyback:750] Out: 2600 [Urine:2600] Intake/Output this shift:    General appearance: alert, cooperative, no distress and placing him on bed pan. Skin: Skin color, texture, turgor normal. No rashes or lesions or condom cath in place.  Groin incision  is fine.  some serosanguinous drainage, but not hematoma like we saw on admit.  Scrotum is beter.  Body ecchymosis is about the same.  Lab Results:   Recent Labs  08/31/13 0334 09/01/13 0349  WBC 8.5 8.6  HGB 8.8* 9.0*  HCT 25.1* 25.9*  PLT 84* 97*    BMET  Recent Labs  08/31/13 0334 09/01/13 0349  NA 127* 130*  K 3.3* 3.7  CL 95* 95*  CO2 24 24  GLUCOSE 89 87  BUN 15 13  CREATININE 0.76 0.80  CALCIUM 6.8* 7.2*   PT/INR  Recent Labs  08/31/13 0334 09/01/13 0349  LABPROT 22.4* 22.9*  INR 1.97* 2.02*     Recent Labs Lab 08/28/13 1357 08/29/13 0420 08/30/13 0745 08/31/13 0334 09/01/13 0349  AST 46* 33 40* 34 44*  ALT ALKPHOS 100 77 97 68 82  BILITOT 8.6* 7.8* 6.3* 5.8* 6.1*  PROT 6.9 5.9* 6.4 5.4* 5.9*  ALBUMIN 2.0* 1.8* 1.8* 1.5* 1.6*     Lipase      Component Value Date/Time   LIPASE 33 08/30/2013 0745     Studies/Results: No results found.  Medications: . aminocaproic acid  1 g Oral 4 times per day  . Chlorhexidine Gluconate Cloth  6 each Topical q morning - 10a  . feeding supplement (ENSURE COMPLETE)  237 mL Oral TID BM  . folic acid  1 mg Oral Daily  . furosemide  40 mg Oral Daily  . LORazepam  0-4 mg Intravenous Q2H while awake  . mometasone-formoterol  2 puff Inhalation BID  . multivitamin with minerals  1 tablet Oral Daily  . mupirocin ointment  1 application Nasal BID  . pantoprazole  40 mg Oral BID  . phytonadione  5 mg Oral Daily  . piperacillin-tazobactam (ZOSYN)  IV  3.375 g Intravenous Q8H  . rifaximin  550 mg Oral BID  . spironolactone  12.5 mg Oral Daily  . thiamine  100 mg Oral Daily   Or  . thiamine  100 mg Intravenous Daily  . vancomycin  1,000 mg Intravenous Q8H    Assessment/Plan 1. Hepatitis C with probable cirrhosis  2. Coagulopathy secondary to Cirrhosis  3. Significant post op bleed into the abdomen, perineum, scrotum and lower legs' after right inguinal hernia repair. Large hematoma right  inguinal hernia repair site.  4. Sepsis 5.  Acute hypoxic respiratory failure with effusions/pulmonary edema 6.  Alcoholism/ongoing use/withdrawal symptoms 7.  Anemia and thromocytopenia 8 . Cocaine use/durg screen positive 9.  Severe malnutrition/hypoalbuminemia   Plan:  No need for surgical treatment at this time.   LOS: 4 days    Tishara Pizano 09/01/2013

## 2013-09-01 NOTE — Progress Notes (Signed)
  Echocardiogram 2D Echocardiogram has been performed.  Xavier Valentine 09/01/2013, 1:36 PM

## 2013-09-01 NOTE — Progress Notes (Signed)
General Surgery [facility]  Patient seen and examined in ICU.  Will follow groin wound with you.  Little to add from surgical standpoint.  Velora Heckler, MD, [facility] Office: [phone]

## 2013-09-01 NOTE — Progress Notes (Signed)
[facility] PROGRESS NOTE  ZIDAN HELGET GNF:[id number] DOB: [birth year] DOA: 08/28/2013 PCP: No PCP Per Patient  Assessment/Plan  Right inguinal and abdominal Hematoma / Post-operative complication due to coagulopathy due to cirrhosis, continuing to have bleeding but appears to be slowing -  Continue daily vitamin K -  Cryoprecipitate and FFP given 8/27 -  Appreciate general surgery recommendations -  Still having oozing bleeding from dressing -  Started amicar on 8/29   Severe sepsis (fever, leukocytosis, low blood pressure) due to possible HCAP, although asymptomatic vs. Infected hematoma.  Blood pressure improved -  blood cultures NGTD -  Urine culture neg -  Continue vancomycin and zosyn day 5 of 7  Acute hypoxic respiratory failure likely secondary to effusions/pulmonary edema from IVF and hypoalbuminemia.  Improving.   -  Change to oral lasix  daily -  Add spironolactone  -  Continue prn duonebs for comfort (seem to be helping greatly per RN) -  Add dulera  Nausea, vomiting, and diarrhea, LFTs and lipase stable -  C. Diff negative -  Antiemetics  -  Continue PPI:  Change to PO -  Advance diet as tolerated  Cirrhosis of liver - Hep C and Alcohol induce with resultant thrombocytopenia, coagulopathy, ascites, elevated bili, low albumin -  Add lasix and spironactone -  Unclear if hx of varices -  minimize hepatotoxins  Ascites, not tense and non-tender  Slow speech/foggy thinking concerning for possible hepatic encephalopathy -  lactulose caused diarrhea -  Start rifaximin  ETOH abuse with increased withdrawal symptoms over last two days, but starting to improve this AM -  Continue CIWA protocol.  Still requiring ativan as often as every two hours on occasion -  Thiamine, folate, MVI  Cocaine positive  - avoid B blockers - discussed health complications of Cocaine use with patient.   Leukocytosis, possibly due to pneumonia, infected hematoma, UA with  rare WBC.  Resolved.  Acute blood loss anemia with ongoing bleeding from incision site.  Hgb approximately stable. -  Transfuse to keep hgb > 8 given ongoing bleeding  Thrombocytopenia, likely due to cirrhosis and consumption from bleeding, trending up -  Transfuse to keep plt > 50K  Hypoalbuminemia, due to cirrhosis -  Maximize nutrition  Hyponatremia due to cirrhosis, improving with diuresis - Urine sodium 9 and osmolality 737  Calcium corrects with albumin  Lactic acidosis is common in setting of cirrhosis  Cachexia, severe protein calorie malnutrition -  Liberalize diet -  Supplements -  Nutrition consultation  Diet:  Low salt, 1.2L fluid Access:  PIV IVF:  off Proph:  SCD  Code Status: full Family Communication: patient alone Disposition Plan: Continue in stepdown for now.  Patient has poor insight into condition.     Consultants:  Gen surg  Hematology  Procedures:  CT ab/p  Antibiotics:  vanc 8/27 >>  Zosyn 8/27 >>   HPI/Subjective:  Still bleeding but much less.  No further vomiting and diarrhea but did have stool yesterday.  Agitated overnight.  Still coughing and SOB.  Wheezing improved with nebulizer treatments per nursing staff    Objective: Filed Vitals:   09/01/13 0000 09/01/13 0100 09/01/13 0200 09/01/13 0400  BP: 101/59 110/76 108/56 117/70  Pulse: 73   72  Temp:    98.6 F (37 C)  TempSrc:    Axillary  Resp: 28   32  Height:      Weight:      SpO2: 100%   97%  Intake/Output Summary (Last 24 hours) at 09/01/13 0713 Last data filed at 09/01/13 0500  Gross per 24 hour  Intake    750 ml  Output   2600 ml  Net  -1850 ml   Filed Weights   08/28/13 2338 08/31/13 0200  Weight: 89.1 kg (196 lb 6.9 oz) 96.7 kg (213 lb 3 oz)    Exam:   General:  Cachectic WM, forced expirations, tachypnea, prolonged expiratory phase.    HEENT:  NCAT, MMM, scleral icterus   Cardiovascular:  RRR, nl S1, S2 no mrg, 2+ pulses, warm  extremities  Respiratory:  Full expiratory wheeze with diminished BS at the bases, no focal rales or rhonchi.  Has a component of upper airway wheezing  Abdomen:   NABS, soft, mildly distended, NT  GU:  Penis and scrotum swollen/ecchymotic, but not erythematous  MSK:   Normal tone and bulk, 1+ bilateral LEE  Neuro:  Grossly moves all extremities  Skin:  Right groin dressing left in place c/d/i.    Data Reviewed: Basic Metabolic Panel:  Recent Labs Lab 08/28/13 1357 08/29/13 0420 08/30/13 0745 08/31/13 0334 09/01/13 0349  NA 127* 127* 127* 127* 130*  K 4.0 3.9 3.6* 3.3* 3.7  CL 90* 95* 93* 95* 95*  CO2 GLUCOSE 88 95 158* 89 87  BUN 14 16 24* 15 13  CREATININE 0.76 0.77 0.92 0.76 0.80  CALCIUM 7.4* 6.8* 6.9* 6.8* 7.2*  MG 1.7  --   --   --   --   PHOS 3.1  --   --   --   --    Liver Function Tests:  Recent Labs Lab 08/28/13 1357 08/29/13 0420 08/30/13 0745 08/31/13 0334 09/01/13 0349  AST 46* 33 40* 34 44*  ALT ALKPHOS 100 77 97 68 82  BILITOT 8.6* 7.8* 6.3* 5.8* 6.1*  PROT 6.9 5.9* 6.4 5.4* 5.9*  ALBUMIN 2.0* 1.8* 1.8* 1.5* 1.6*    Recent Labs Lab 08/28/13 1357 08/30/13 0745  LIPASE 26 33    Recent Labs Lab 08/28/13 1357  AMMONIA 60   CBC:  Recent Labs Lab 08/28/13 1357  08/29/13 0420 08/29/13 1447 08/30/13 0745 08/31/13 0334 09/01/13 0349  WBC 23.7*  --  12.2* 8.2 14.1* 8.5 8.6  NEUTROABS 18.3*  --  8.9*  --   --   --   --   HGB 10.7*  --  8.8* 8.7* 9.7* 8.8* 9.0*  HCT 30.7*  --  25.2* 25.7* 28.6* 25.1* 25.9*  MCV 98.1  --  97.3 100.4* 99.0 96.2 96.6  PLT 139*  < > 77* 70* 103* 84* 97*  < > = values in this interval not displayed. Cardiac Enzymes: No results found for this basename: CKTOTAL, CKMB, CKMBINDEX, TROPONINI,  in the last 168 hours BNP (last 3 results)  Recent Labs  08/28/13 1357  PROBNP 739.8*   CBG: No results found for this basename: GLUCAP,  in the last 168 hours  Recent Results  (from the past 240 hour(s))  CULTURE, BLOOD (ROUTINE X 2)     Status: None   Collection Time    08/28/13  1:57 PM      Result Value Ref Range Status   Specimen Description BLOOD LEFT ANTECUBITAL   Final   Special Requests BOTTLES DRAWN AEROBIC AND ANAEROBIC   Final   Culture  Setup Time     Final   Value: 08/28/2013 16:24  Performed at [facility]     Final   Value:        BLOOD CULTURE RECEIVED NO GROWTH TO DATE CULTURE WILL BE HELD FOR 5 DAYS BEFORE ISSUING A FINAL NEGATIVE REPORT     Performed at [facility]   Report Status PENDING   Incomplete  CULTURE, BLOOD (ROUTINE X 2)     Status: None   Collection Time    08/28/13  1:57 PM      Result Value Ref Range Status   Specimen Description BLOOD BLOOD LEFT FOREARM   Final   Special Requests BOTTLES DRAWN AEROBIC AND ANAEROBIC   Final   Culture  Setup Time     Final   Value: 08/28/2013 16:24     Performed at [facility]   Culture     Final   Value:        BLOOD CULTURE RECEIVED NO GROWTH TO DATE CULTURE WILL BE HELD FOR 5 DAYS BEFORE ISSUING A FINAL NEGATIVE REPORT     Performed at [facility]   Report Status PENDING   Incomplete  URINE CULTURE     Status: None   Collection Time    08/28/13  3:41 PM      Result Value Ref Range Status   Specimen Description URINE, CLEAN CATCH   Final   Special Requests NONE   Final   Culture  Setup Time     Final   Value: 08/28/2013 23:33     Performed at [facility] Count     Final   Value: 9,000 COLONIES/ML     Performed at [facility]   Culture     Final   Value: INSIGNIFICANT GROWTH     Performed at [facility]   Report Status 08/30/2013 FINAL   Final  MRSA PCR SCREENING     Status: Abnormal   Collection Time    08/29/13  6:11 PM      Result Value Ref Range Status   MRSA by PCR POSITIVE (*) NEGATIVE Final   Comment:            The GeneXpert MRSA Assay (FDA     approved for NASAL specimens      only), is one component of a     comprehensive MRSA colonization     surveillance program. It is not     intended to diagnose MRSA     infection nor to guide or     monitor treatment for     MRSA infections.     RESULT CALLED TO, READ BACK BY AND VERIFIED WITH:     [facility] SHERYL RN 2050 08/29/2013 BY BOVELL,T.  CLOSTRIDIUM DIFFICILE BY PCR     Status: None   Collection Time    08/30/13  4:18 AM      Result Value Ref Range Status   C difficile by pcr NEGATIVE  NEGATIVE Final   Comment: Performed at [facility]     Studies: Dg Chest Port 1 View  08/30/2013   CLINICAL DATA:  Shortness of breath  EXAM: PORTABLE CHEST - 1 VIEW  COMPARISON:  None.  FINDINGS: Cardiac shadow is at the upper limits of normal in size. Increased density is noted in the left lung base consistent with the previously seen atelectasis and pleural effusion. The previously seen right-sided pleural effusion is less well appreciated on this exam. No other focal  abnormality is noted.  IMPRESSION: Left basilar atelectasis and pleural effusion.   Electronically Signed   By: Alcide Clever M.D.   On: 08/30/2013 08:17    Scheduled Meds: . aminocaproic acid  1 g Oral 4 times per day  . Chlorhexidine Gluconate Cloth  6 each Topical q morning - 10a  . feeding supplement (ENSURE COMPLETE)  237 mL Oral TID BM  . folic acid  1 mg Oral Daily  . LORazepam  0-4 mg Intravenous Q2H while awake  . multivitamin with minerals  1 tablet Oral Daily  . mupirocin ointment  1 application Nasal BID  . pantoprazole  40 mg Oral BID  . phytonadione  5 mg Oral Daily  . piperacillin-tazobactam (ZOSYN)  IV  3.375 g Intravenous Q8H  . rifaximin  550 mg Oral BID  . thiamine  100 mg Oral Daily   Or  . thiamine  100 mg Intravenous Daily  . vancomycin  1,000 mg Intravenous Q8H   Continuous Infusions:    Principal Problem:   Severe sepsis Active Problems:   Post-operative complication   Hematoma   Coagulopathy   Acute blood loss  anemia   possible HCAP (healthcare-associated pneumonia)   Leukocytosis, unspecified   Thrombocytopenia   Hyponatremia   possible encephalopathy, hepatic    Time spent: 30 min    Deanne Bedgood, [facility]  [facility] Pager [phone]. If 7PM-7AM, please contact night-coverage at [email or web address], password [facility] 09/01/2013, 7:13 AM  LOS: 4 days

## 2013-09-01 NOTE — Progress Notes (Signed)
NUTRITION FOLLOW UP  Intervention:   - D/C Ensure Complete as pt refusing them - [employer] Instant Breakfast BID - Encouraged increased PO intake as pain improves - RD to continue to monitor   Nutrition Dx:   Increased nutrient needs related to cirrhosis with severe sepsis as evidenced by MD notes - ongoing    Goal:   Pt to consume >90% of meals/supplements - not met   Monitor:   Weights, labs, intake  Assessment:   Pt with past medical h/o of ETOH abuse and Hep C who underwent a right inguinal hernia repair in [place] 1 wk ago. He was doing well post op until 2 days ago when he states the wound began to bleed. It has continued to bleed for these 2 days and he therefore decided to come to the ER. He has no c/o dizziness lightheaded sensation with standing. He is noted to have a significantly elevated bilirubin and an elevated INR. Found to have right inguinal and abdominal hematoma with severe sepsis and cirrhosis with ascites.   8/28: - Pt positive for cocaine and opiates on admission  - Unable to meet with pt due to pt being transferred to ICU  - No meal intake documented today   8/31: - Met with pt who reports eating 2 meals/day PTA with good appetite - Only ate 25% of lunch today, said he couldn't eat any more due to pain, notified RN - Refused Ensure Complete over the weekend, interested in [employer], will order    Height: Ht Readings from Last 1 Encounters:  08/28/13 5' 11"  (1.803 m)    Weight Status:   Wt Readings from Last 1 Encounters:  08/31/13 213 lb 3 oz (96.7 kg)  Admit wt         196 lb 6.9 oz (89.1 kg)  Re-estimated needs:  Kcal: 2300-2500  Protein: 120-140g  Fluid: per MD   Skin: generalized edema, Jaundice, right groin incision    Diet Order: Sodium Restricted   Intake/Output Summary (Last 24 hours) at 09/01/13 1529 Last data filed at 09/01/13 0500  Gross per 24 hour  Intake    750 ml  Output   1200 ml  Net   -450 ml     Last BM: 8/31   Labs:   Recent Labs Lab 08/28/13 1357  08/30/13 0745 08/31/13 0334 09/01/13 0349  NA 127*  < > 127* 127* 130*  K 4.0  < > 3.6* 3.3* 3.7  CL 90*  < > 93* 95* 95*  CO2 20  < > 22 24 24   BUN 14  < > 24* 15 13  CREATININE 0.76  < > 0.92 0.76 0.80  CALCIUM 7.4*  < > 6.9* 6.8* 7.2*  MG 1.7  --   --   --   --   PHOS 3.1  --   --   --   --   GLUCOSE 88  < > 158* 89 87  < > = values in this interval not displayed.  CBG (last 3)  No results found for this basename: GLUCAP,  in the last 72 hours  Scheduled Meds: . aminocaproic acid  1 g Oral 4 times per day  . Chlorhexidine Gluconate Cloth  6 each Topical q morning - 10a  . feeding supplement (ENSURE COMPLETE)  237 mL Oral TID BM  . folic acid  1 mg Oral Daily  . furosemide  40 mg Oral Daily  . LORazepam  0-4 mg Intravenous  Q2H while awake  . mometasone-formoterol  2 puff Inhalation BID  . multivitamin with minerals  1 tablet Oral Daily  . mupirocin ointment  1 application Nasal BID  . pantoprazole  40 mg Oral BID  . phytonadione  5 mg Oral Daily  . piperacillin-tazobactam (ZOSYN)  IV  3.375 g Intravenous Q8H  . rifaximin  550 mg Oral BID  . spironolactone  12.5 mg Oral Daily  . thiamine  100 mg Oral Daily   Or  . thiamine  100 mg Intravenous Daily  . vancomycin  1,000 mg Intravenous [address] [place], [place], [place] [phone] Pager [phone] Weekend/After Hours Pager

## 2013-09-01 NOTE — Progress Notes (Signed)
CARE MANAGEMENT NOTE 09/01/2013  Patient:  Xavier Valentine, Xavier Valentine   Account Number:  [id number]  Date Initiated:  08/29/2013  Documentation initiated by:  [facility]  Subjective/Objective Assessment:   53 Y/O M ADMITTED W/ANEMIA.     Action/Plan:   FROM OUT OF TOWN-[place].   Anticipated DC Date:  09/04/2013   Anticipated DC Plan:  HOME/SELF CARE      DC Planning Services  CM consult      Choice offered to / List presented to:             Status of service:  In process, will continue to follow Medicare Important Message given?   (If response is "NO", the following Medicare IM given date fields will be blank) Date Medicare IM given:   Medicare IM given by:   Date Additional Medicare IM given:   Additional Medicare IM given by:    Discharge Disposition:    Per UR Regulation:  Reviewed for med. necessity/level of care/duration of stay  If discussed at Long Length of Stay Meetings, dates discussed:    Comments:  [id number]/Rhonda Davis,Rn,BSN,CCM: patient transfer to sdu on [id number] due to hypotension and continued bleeding/pt is confused -hc of polysubstance and etoh abuse/has family in the area but only recently moved to this area from [facility]  08/29/13 KATHY MAHABIR RN,BSN NCM [phone] NO ANTICIPATED D/C NEEDS.

## 2013-09-02 DIAGNOSIS — J189 Pneumonia, unspecified organism: Secondary | ICD-10-CM

## 2013-09-02 DIAGNOSIS — R188 Other ascites: Secondary | ICD-10-CM

## 2013-09-02 DIAGNOSIS — D689 Coagulation defect, unspecified: Secondary | ICD-10-CM

## 2013-09-02 DIAGNOSIS — D696 Thrombocytopenia, unspecified: Secondary | ICD-10-CM

## 2013-09-02 HISTORY — DX: Other ascites: R18.8

## 2013-09-02 HISTORY — DX: Thrombocytopenia, unspecified: D69.6

## 2013-09-02 HISTORY — DX: Pneumonia, unspecified organism: J18.9

## 2013-09-02 HISTORY — DX: Coagulation defect, unspecified: D68.9

## 2013-09-02 LAB — CBC
HEMATOCRIT: 28.6 % — AB (ref 39.0–52.0)
Hemoglobin: 9.6 g/dL — ABNORMAL LOW (ref 13.0–17.0)
MCH: 33.2 pg (ref 26.0–34.0)
MCHC: 33.6 g/dL (ref 30.0–36.0)
MCV: 99 fL (ref 78.0–100.0)
Platelets: 98 10*3/uL — ABNORMAL LOW (ref 150–400)
RBC: 2.89 MIL/uL — AB (ref 4.22–5.81)
RDW: 18.8 % — ABNORMAL HIGH (ref 11.5–15.5)
WBC: 8.1 10*3/uL (ref 4.0–10.5)

## 2013-09-02 LAB — PROTIME-INR
INR: 1.92 — ABNORMAL HIGH (ref 0.00–1.49)
Prothrombin Time: 22 seconds — ABNORMAL HIGH (ref 11.6–15.2)

## 2013-09-02 LAB — COMPREHENSIVE METABOLIC PANEL
ALBUMIN: 1.8 g/dL — AB (ref 3.5–5.2)
ALT: 26 U/L (ref 0–53)
AST: 46 U/L — AB (ref 0–37)
Alkaline Phosphatase: 79 U/L (ref 39–117)
Anion gap: 11 (ref 5–15)
BUN: 10 mg/dL (ref 6–23)
CALCIUM: 7.3 mg/dL — AB (ref 8.4–10.5)
CO2: 26 mEq/L (ref 19–32)
CREATININE: 0.76 mg/dL (ref 0.50–1.35)
Chloride: 95 mEq/L — ABNORMAL LOW (ref 96–112)
GFR calc Af Amer: >90 mL/min (ref >90)
GFR calc non Af Amer: >90 mL/min (ref >90)
Glucose, Bld: 104 mg/dL — ABNORMAL HIGH (ref 70–99)
Potassium: 3.5 mEq/L — ABNORMAL LOW (ref 3.7–5.3)
Sodium: 132 mEq/L — ABNORMAL LOW (ref 137–147)
TOTAL PROTEIN: 6.4 g/dL (ref 6.0–8.3)
Total Bilirubin: 6.1 mg/dL — ABNORMAL HIGH (ref 0.3–1.2)

## 2013-09-02 LAB — BLOOD GAS, ARTERIAL
Acid-Base Excess: 2.2 mmol/L — ABNORMAL HIGH (ref 0.0–2.0)
Bicarbonate: 24.9 mEq/L — ABNORMAL HIGH (ref 20.0–24.0)
Drawn by: 11249
FIO2: 1 %
O2 Saturation: 99.8 %
Patient temperature: 98.6
TCO2: 22.7 mmol/L (ref 0–100)
pCO2 arterial: 33 mmHg — ABNORMAL LOW (ref 35.0–45.0)
pH, Arterial: 7.491 — ABNORMAL HIGH (ref 7.350–7.450)
pO2, Arterial: 132 mmHg — ABNORMAL HIGH (ref 80.0–100.0)

## 2013-09-02 LAB — PRO B NATRIURETIC PEPTIDE: Pro B Natriuretic peptide (BNP): 348.5 pg/mL — ABNORMAL HIGH (ref 0–125)

## 2013-09-02 LAB — AMMONIA: Ammonia: 27 umol/L (ref 11–60)

## 2013-09-02 MED ORDER — CHLORHEXIDINE GLUCONATE 0.12 % MT SOLN
15.0000 mL | Freq: Two times a day (BID) | OROMUCOSAL | Status: DC
  Administered 2013-09-03 – 2013-09-07 (×7): 15 mL via OROMUCOSAL
  Filled 2013-09-02 (×9): qty 15

## 2013-09-02 MED ORDER — FUROSEMIDE 10 MG/ML IJ SOLN
40.0000 mg | Freq: Once | INTRAMUSCULAR | Status: AC
  Administered 2013-09-02: 40 mg via INTRAVENOUS
  Filled 2013-09-02: qty 4

## 2013-09-02 MED ORDER — POTASSIUM CHLORIDE 10 MEQ/100ML IV SOLN
10.0000 meq | INTRAVENOUS | Status: AC
  Administered 2013-09-02 (×4): 10 meq via INTRAVENOUS
  Filled 2013-09-02 (×4): qty 100

## 2013-09-02 MED ORDER — DEXMEDETOMIDINE HCL IN NACL 200 MCG/50ML IV SOLN
0.4000 ug/kg/h | INTRAVENOUS | Status: DC
  Administered 2013-09-02: 0.2 ug/kg/h via INTRAVENOUS
  Administered 2013-09-02: 0.401 ug/kg/h via INTRAVENOUS
  Administered 2013-09-02: 0.4 ug/kg/h via INTRAVENOUS
  Administered 2013-09-02: 1 ug/kg/h via INTRAVENOUS
  Filled 2013-09-02 (×3): qty 50

## 2013-09-02 MED ORDER — FUROSEMIDE 10 MG/ML IJ SOLN
40.0000 mg | Freq: Two times a day (BID) | INTRAMUSCULAR | Status: DC
  Administered 2013-09-02 – 2013-09-03 (×3): 40 mg via INTRAVENOUS
  Filled 2013-09-02 (×3): qty 4

## 2013-09-02 MED ORDER — OXYCODONE HCL 5 MG PO TABS
5.0000 mg | ORAL_TABLET | ORAL | Status: DC | PRN
  Administered 2013-09-02: 5 mg via ORAL
  Administered 2013-09-03: 10 mg via ORAL
  Administered 2013-09-04 – 2013-09-05 (×2): 5 mg via ORAL
  Administered 2013-09-05 – 2013-09-06 (×2): 10 mg via ORAL
  Filled 2013-09-02: qty 1
  Filled 2013-09-02: qty 2
  Filled 2013-09-02: qty 1
  Filled 2013-09-02: qty 2
  Filled 2013-09-02: qty 1
  Filled 2013-09-02: qty 2

## 2013-09-02 MED ORDER — CETYLPYRIDINIUM CHLORIDE 0.05 % MT LIQD
7.0000 mL | Freq: Two times a day (BID) | OROMUCOSAL | Status: DC
  Administered 2013-09-03 – 2013-09-10 (×12): 7 mL via OROMUCOSAL

## 2013-09-02 MED ORDER — SPIRONOLACTONE 25 MG PO TABS
25.0000 mg | ORAL_TABLET | Freq: Every day | ORAL | Status: DC
  Administered 2013-09-02 – 2013-09-03 (×2): 25 mg via ORAL
  Filled 2013-09-02 (×2): qty 1

## 2013-09-02 NOTE — Progress Notes (Signed)
ANTIBIOTIC CONSULT NOTE - FOLLOW UP  Pharmacy Consult for Vancomycin/Zosyn Indication: sepsis  Allergies  Allergen Reactions  . Bee Venom Swelling    Patient Measurements: Height:  (180.3 cm) Weight: 213 lb 3 oz (96.7 kg) IBW/kg (Calculated) : 75.3  Vital Signs: Temp: 97.7 F (36.5 C) (09/01 0435) Temp src: Oral (09/01 0435) BP: 114/46 mmHg (09/01 0805) Pulse Rate: 78 (09/01 0805) Intake/Output from previous day: 08/31 0701 - 09/01 0700 In: 1470 [P.O.:850; I.V.:120; IV Piggyback:500] Out: 2350 [Urine:2350] Intake/Output from this shift:    Labs:  Recent Labs  08/31/13 0334 09/01/13 0349 09/02/13 0333  WBC 8.5 8.6 8.1  HGB 8.8* 9.0* 9.6*  PLT 84* 97* 98*  CREATININE 0.76 0.80 0.76   Estimated Creatinine Clearance: 123.8 ml/min (by C-G formula based on Cr of 0.76).  Recent Labs  08/30/13 1910  VANCOTROUGH 17.4     Microbiology: Recent Results (from the past 720 hour(s))  CULTURE, BLOOD (ROUTINE X 2)     Status: None   Collection Time    08/28/13  1:57 PM      Result Value Ref Range Status   Specimen Description BLOOD LEFT ANTECUBITAL   Final   Special Requests BOTTLES DRAWN AEROBIC AND ANAEROBIC   Final   Culture  Setup Time     Final   Value: 08/28/2013 16:24     Performed at [facility]   Culture     Final   Value:        BLOOD CULTURE RECEIVED NO GROWTH TO DATE CULTURE WILL BE HELD FOR 5 DAYS BEFORE ISSUING A FINAL NEGATIVE REPORT     Performed at [facility]   Report Status PENDING   Incomplete  CULTURE, BLOOD (ROUTINE X 2)     Status: None   Collection Time    08/28/13  1:57 PM      Result Value Ref Range Status   Specimen Description BLOOD BLOOD LEFT FOREARM   Final   Special Requests BOTTLES DRAWN AEROBIC AND ANAEROBIC   Final   Culture  Setup Time     Final   Value: 08/28/2013 16:24     Performed at [facility]   Culture     Final   Value:        BLOOD CULTURE RECEIVED NO GROWTH TO DATE CULTURE  WILL BE HELD FOR 5 DAYS BEFORE ISSUING A FINAL NEGATIVE REPORT     Performed at [facility]   Report Status PENDING   Incomplete  URINE CULTURE     Status: None   Collection Time    08/28/13  3:41 PM      Result Value Ref Range Status   Specimen Description URINE, CLEAN CATCH   Final   Special Requests NONE   Final   Culture  Setup Time     Final   Value: 08/28/2013 23:33     Performed at [facility] Count     Final   Value: 9,000 COLONIES/ML     Performed at [facility]   Culture     Final   Value: INSIGNIFICANT GROWTH     Performed at [facility]   Report Status 08/30/2013 FINAL   Final  MRSA PCR SCREENING     Status: Abnormal   Collection Time    08/29/13  6:11 PM      Result Value Ref Range Status   MRSA by PCR POSITIVE (*)  NEGATIVE Final   Comment:            The GeneXpert MRSA Assay (FDA     approved for NASAL specimens     only), is one component of a     comprehensive MRSA colonization     surveillance program. It is not     intended to diagnose MRSA     infection nor to guide or     monitor treatment for     MRSA infections.     RESULT CALLED TO, READ BACK BY AND VERIFIED WITH:     [facility] SHERYL RN 2050 08/29/2013 BY BOVELL,T.  CLOSTRIDIUM DIFFICILE BY PCR     Status: None   Collection Time    08/30/13  4:18 AM      Result Value Ref Range Status   C difficile by pcr NEGATIVE  NEGATIVE Final   Comment: Performed at [facility]    Anti-infectives   Start     Dose/Rate Route Frequency Ordered Stop   08/30/13 2200  rifaximin (XIFAXAN) tablet 550 mg     550 mg Oral 2 times daily 08/30/13 1906     08/29/13 0400  piperacillin-tazobactam (ZOSYN) IVPB 3.375 g     3.375 g 12.5 mL/hr over 240 Minutes Intravenous Every 8 hours 08/29/13 0347     08/29/13 0400  vancomycin (VANCOCIN) IVPB 1000 mg/200 mL premix     1,000 mg 200 mL/hr over 60 Minutes Intravenous Every 8 hours 08/29/13 0347     08/28/13 1400   piperacillin-tazobactam (ZOSYN) IVPB 4.5 g  Status:  Discontinued     4.5 g 200 mL/hr over 30 Minutes Intravenous  Once 08/28/13 1352 08/28/13 1356   08/28/13 1400  vancomycin (VANCOCIN) IVPB 1000 mg/200 mL premix     1,000 mg 200 mL/hr over 60 Minutes Intravenous  Once 08/28/13 1352 08/28/13 1548   08/28/13 1400  piperacillin-tazobactam (ZOSYN) IVPB 3.375 g     3.375 g 100 mL/hr over 30 Minutes Intravenous  Once 08/28/13 1357 08/28/13 1547      Assessment: 53yo M with past medical h/o of ETOH abuse and Hep C who underwent a right inguinal hernia repair in [place] 1 wk prior to admission. He was doing well post op until 2 days prior to admission when he states the wound began to bleed. Vancomycin and Zosyn were ordered for severe sepsis (fever, leukocytosis, hypotension) due to possible HCAP (infiltrates on CT) vs infected hematoma.  Goal of Therapy:  Vancomycin trough level 15-20 mcg/ml Doses adjusted per renal function Eradication of infection   9/1: D#6/7 Zosyn 3.375 grams IV q8h (extended-infusion) + vancomycin 1 gram IV q8h  Afebrile Leukocytosis resolved Serum creatinine remains WNL, stable Blood cultures remain negative to date Urine culture showed insignificant growth On 8/29 vancomycin trough was 17.4 (therapeutic) on 1 gram q8h - no change to dosage.   Plan:  Doreatha Martin out 7 days of Zosyn and vancomycin at current dosages.  Elie Goody, PharmD, BCPS Pager: [phone] 09/02/2013  8:35 AM

## 2013-09-02 NOTE — Progress Notes (Signed)
Event: Notified by RN pt w/ increased WOB and 02 sats in the 80's. Pt w/ significant use of accessory muscles. BBS w/ audible wheezes and mild crackles. RT in to see, pt received duoneb w/ some improvement. RN also reports pt seems somewhat more confused and lethargic than earlier in the shift. Orders placed for Lasix 40 mg IV and stat PCXR. NP to bedside.  Subjective: Pt unable to provide information given AMS. Objective: Xavier Valentine is a 53 y.o. male with past medical h/o of ETOH abuse and Hep C who underwent a (R) inguinal hernia repair in [place] 1 wk prior to admission on 08/28/13. He was doing well post op until 2 days prior to admission when he reported the wound began to bleed so he presented to [facility]. Pt denied c/o dizziness but was noted in ED to have significantly elevated bilirubin and an elevated INR.  Pt's hospitalization has been complicated by ongoing Cirrhosis related (coagulopathy, ascites, elevated bili and low albumin), severe sepsis, acute hypoxic respiratory failure, N/V/D and likely hepatic encephalopathy. At bedside pt noted w/ mild to moderately increased WOB, 02 sats 96-98% on NRB at 15L. RT and RN report some improvement in WOB. BBS diminished w/ intermittent expiratory wheezes. VS P-82, R-24-28, BP-131/84 and pt remains afebrile. Pt is lethargic but responds to verbal stimuli and can at times follow simple commands. He does respond verbally and is appropriate at times and confused at others.  Pt has already diuresed 1200cc s/p Lasix 40 mg IV. PCXR reveals increased central vascular congestion and perihilar interstitial and airspace opacities c/w pulmonary edema. Progression of (L) pleural effusion compared to CXR from 08/30/13. ABG reveals pH-7.49, pC02-33, p02-132 and bicarb of 24.9. Telemetry shows SR w/ rate of 70-80's.  Assessment/Plan: 1. Acute hypoxic respiratory failure in setting of pulmonary edema and progression of (L) pleural effusion. Much improved after IV Lasix.  Will repeat Lasix 40 mg IV x 1. Continue Vanc/Zosyn and Duonebs. Continue to titrate 02 down as pt tolerates. Add BNP to am labs. 2. Increased lethargy and confusion:  Felt likely hepatic encephalopathy. Lactulose d/c'd d/t diarrhea. Continue Rifaimin. Add ammonia to am labs. Will continue to monitor closely in SDU.   Leanne Chang, NP-C [facility] Pager [phone]

## 2013-09-02 NOTE — Progress Notes (Addendum)
[facility] PROGRESS NOTE  Xavier Valentine ZOX:[id number] DOB: 12-Aug-1960 DOA: 08/28/2013 PCP: No PCP Per Patient  Brief Summary  Xavier Valentine is a 53 y.o. male with past medical h/o of ETOH abuse and Hep C who underwent a right inguinal hernia repair in [place] 1 wk prior to admission.  After surgery, he moved from [place] to [place] because he was at risk of becoming homeless and needed to move in with his family. He was doing well post op until 2 days prior to admission when he states his wound began to bleed and would not stop.  In the ER, he was noted to have a significantly elevated bilirubin and an elevated INR.  After cryo, FFP, vitamin K, amicar, repeated pressure, his incision has finally started to stop bleeding.  His course has been complicated by sepsis possibly due to HCAP and he has almost completed a course of antibiotics.  He has also had hepatic encephalopathy and alcohol withdrawal.  He also became volume overloaded due to fluid resuscitation in setting of cirrhosis early in admission and is continuing to diurese.  His prognosis overall is fair to poor and his family is aware however he has poor insight into his condition.    Assessment/Plan  Right inguinal and abdominal Hematoma / Post-operative complication due to coagulopathy due to cirrhosis, continuing to have bleeding but appears to be slowing, serosanguinous currently -  Continue daily vitamin K -  Cryoprecipitate and FFP given 8/27 -  Appreciate general surgery recommendations -  Started amicar on 8/29   Severe sepsis (fever, leukocytosis, low blood pressure) due to possible HCAP, although asymptomatic vs. Infected hematoma.  Blood pressure improved -  blood cultures NGTD -  Urine culture neg -  Continue vancomycin and zosyn day 6 of 7  Acute hypoxic respiratory failure likely secondary to effusions/pulmonary edema from IVF and hypoalbuminemia.  Improving.   -  Continue lasix  IV BID -  Increase  spironolactone  -  Continue prn duonebs for comfort (seem to be helping greatly per RN) -  Continue dulera  Nausea, vomiting, and diarrhea, LFTs and lipase stable.  C. Diff negative.  Resolved.    Cirrhosis of liver - Hep C and Alcohol induce with resultant thrombocytopenia, coagulopathy, ascites, elevated bili, low albumin -  Cont lasix and spironactone -  Unclear if hx of varices -  minimize hepatotoxins  Ascites, not tense and non-tender  Slow speech/foggy thinking concerning for possible hepatic encephalopathy -  lactulose caused diarrhea -  Continue rifaximin  ETOH abuse with increased withdrawal symptoms over last several days.   -  Continue CIWA protocol q2h per RN request -  Thiamine, folate, MVI  Cocaine positive, avoid B blockers.  Discussed health complications of Cocaine use with patient.   Leukocytosis, possibly due to pneumonia, infected hematoma, UA with rare WBC.  Resolved.  Acute blood loss anemia, incision site bleeding slowing.  Hgb approximately stable. -  Transfuse to keep hgb > 8 given ongoing bleeding  Thrombocytopenia, likely due to cirrhosis and consumption from bleeding, stable around 100K  Hypoalbuminemia, due to cirrhosis -  Maximize nutrition  Hyponatremia due to cirrhosis, improving with diuresis - Urine sodium 9 and osmolality 737  Calcium corrects with albumin  Lactic acidosis is common in setting of cirrhosis  Cachexia, severe protein calorie malnutrition -  Supplements -  Nutrition consultation  Diet:  Low salt, 1.2L fluid Access:  PIV IVF:  off Proph:  SCD  Code Status: full Family  Communication: patient alone Disposition Plan:  Continue in stepdown for now.  Patient has poor insight into condition.     Consultants:  Gen surg  Hematology  Procedures:  CT ab/p  Antibiotics:  vanc 8/27 >>  Zosyn 8/27 >>   HPI/Subjective:  Acute episode of SOB overnight that improved with lasix.  Only serosanguinous drainage from  groin incision.  Increased confusion and still requiring ativan.  C/o persistent scrotal pain.    Objective: Filed Vitals:   09/02/13 0406 09/02/13 0425 09/02/13 0435 09/02/13 0500  BP: 134/69     Pulse:  75  71  Temp:   97.7 F (36.5 C)   TempSrc:   Oral   Resp:  25  19  Height:      Weight:      SpO2:  99%  100%    Intake/Output Summary (Last 24 hours) at 09/02/13 0716 Last data filed at 09/02/13 0700  Gross per 24 hour  Intake   1470 ml  Output   2350 ml  Net   -880 ml   Filed Weights   08/28/13 2338 08/31/13 0200  Weight: 89.1 kg (196 lb 6.9 oz) 96.7 kg (213 lb 3 oz)    Exam:   General:  Cachectic WM, forced expirations, tachypnea, prolonged expiratory phase.    HEENT:  NCAT, MMM, scleral icterus   Cardiovascular:  RRR, nl S1, S2 no mrg, 2+ pulses, warm extremities  Respiratory:  Full expiratory wheeze with diminished BS at the bases, no focal rales or rhonchi.  Has a component of upper airway wheezing  Abdomen:   NABS, soft, mild to moderate distension, NT  GU:  Penis and scrotum swollen/ecchymotic, but not erythematous  MSK:   Normal tone and bulk, 1+ bilateral LEE  Neuro:  Grossly moves all extremities  Skin:  Right groin dressing with serosang drainage  Data Reviewed: Basic Metabolic Panel:  Recent Labs Lab 08/28/13 1357 08/29/13 0420 08/30/13 0745 08/31/13 0334 09/01/13 0349 09/02/13 0333  NA 127* 127* 127* 127* 130* 132*  K 4.0 3.9 3.6* 3.3* 3.7 3.5*  CL 90* 95* 93* 95* 95* 95*  CO2 GLUCOSE 88 95 158* 89 87 104*  BUN 14 16 24* CREATININE 0.76 0.77 0.92 0.76 0.80 0.76  CALCIUM 7.4* 6.8* 6.9* 6.8* 7.2* 7.3*  MG 1.7  --   --   --   --   --   PHOS 3.1  --   --   --   --   --    Liver Function Tests:  Recent Labs Lab 08/29/13 0420 08/30/13 0745 08/31/13 0334 09/01/13 0349 09/02/13 0333  AST 33 40* 34 44* 46*  ALT ALKPHOS 77 97 68 82 79  BILITOT 7.8* 6.3* 5.8* 6.1* 6.1*  PROT 5.9* 6.4  5.4* 5.9* 6.4  ALBUMIN 1.8* 1.8* 1.5* 1.6* 1.8*    Recent Labs Lab 08/28/13 1357 08/30/13 0745  LIPASE 26 33    Recent Labs Lab 08/28/13 1357 09/02/13 0520  AMMONIA 60 27   CBC:  Recent Labs Lab 08/28/13 1357  08/29/13 0420 08/29/13 1447 08/30/13 0745 08/31/13 0334 09/01/13 0349 09/02/13 0333  WBC 23.7*  --  12.2* 8.2 14.1* 8.5 8.6 8.1  NEUTROABS 18.3*  --  8.9*  --   --   --   --   --   HGB 10.7*  --  8.8* 8.7* 9.7* 8.8* 9.0* 9.6*  HCT 30.7*  --  25.2* 25.7* 28.6* 25.1* 25.9* 28.6*  MCV 98.1  --  97.3 100.4* 99.0 96.2 96.6 99.0  PLT 139*  < > 77* 70* 103* 84* 97* 98*  < > = values in this interval not displayed. Cardiac Enzymes: No results found for this basename: CKTOTAL, CKMB, CKMBINDEX, TROPONINI,  in the last 168 hours BNP (last 3 results)  Recent Labs  08/28/13 1357 09/02/13 0520  PROBNP 739.8* 348.5*   CBG: No results found for this basename: GLUCAP,  in the last 168 hours  Recent Results (from the past 240 hour(s))  CULTURE, BLOOD (ROUTINE X 2)     Status: None   Collection Time    08/28/13  1:57 PM      Result Value Ref Range Status   Specimen Description BLOOD LEFT ANTECUBITAL   Final   Special Requests BOTTLES DRAWN AEROBIC AND ANAEROBIC   Final   Culture  Setup Time     Final   Value: 08/28/2013 16:24     Performed at [facility]   Culture     Final   Value:        BLOOD CULTURE RECEIVED NO GROWTH TO DATE CULTURE WILL BE HELD FOR 5 DAYS BEFORE ISSUING A FINAL NEGATIVE REPORT     Performed at [facility]   Report Status PENDING   Incomplete  CULTURE, BLOOD (ROUTINE X 2)     Status: None   Collection Time    08/28/13  1:57 PM      Result Value Ref Range Status   Specimen Description BLOOD BLOOD LEFT FOREARM   Final   Special Requests BOTTLES DRAWN AEROBIC AND ANAEROBIC   Final   Culture  Setup Time     Final   Value: 08/28/2013 16:24     Performed at [facility]   Culture     Final   Value:         BLOOD CULTURE RECEIVED NO GROWTH TO DATE CULTURE WILL BE HELD FOR 5 DAYS BEFORE ISSUING A FINAL NEGATIVE REPORT     Performed at [facility]   Report Status PENDING   Incomplete  URINE CULTURE     Status: None   Collection Time    08/28/13  3:41 PM      Result Value Ref Range Status   Specimen Description URINE, CLEAN CATCH   Final   Special Requests NONE   Final   Culture  Setup Time     Final   Value: 08/28/2013 23:33     Performed at [facility] Count     Final   Value: 9,000 COLONIES/ML     Performed at [facility]   Culture     Final   Value: INSIGNIFICANT GROWTH     Performed at [facility]   Report Status 08/30/2013 FINAL   Final  MRSA PCR SCREENING     Status: Abnormal   Collection Time    08/29/13  6:11 PM      Result Value Ref Range Status   MRSA by PCR POSITIVE (*) NEGATIVE Final   Comment:            The GeneXpert MRSA Assay (FDA     approved for NASAL specimens     only), is one component of a     comprehensive MRSA colonization     surveillance program. It is not  intended to diagnose MRSA     infection nor to guide or     monitor treatment for     MRSA infections.     RESULT CALLED TO, READ BACK BY AND VERIFIED WITH:     [facility] SHERYL RN 2050 08/29/2013 BY BOVELL,T.  CLOSTRIDIUM DIFFICILE BY PCR     Status: None   Collection Time    08/30/13  4:18 AM      Result Value Ref Range Status   C difficile by pcr NEGATIVE  NEGATIVE Final   Comment: Performed at [facility]     Studies: Dg Chest Port 1 View  09/02/2013   CLINICAL DATA:  Shortness of breath  EXAM: PORTABLE CHEST - 1 VIEW  COMPARISON:  08/30/2013  FINDINGS: Prominent cardiomediastinal contours. Central vascular congestion. Perihilar interstitial and hazy airspace opacities. Left pleural effusion has increased and predominantly collects along the periphery of the left hemithorax. Associated airspace consolidation. No pneumothorax. No interval  osseous change.  IMPRESSION: Increased central vascular congestion and perihilar interstitial and airspace opacities, favor pulmonary edema.  Increase left pleural effusion and associated airspace consolidation; atelectasis versus pneumonia.   Electronically Signed   By: Jearld Lesch M.D.   On: 09/02/2013 00:49    Scheduled Meds: . aminocaproic acid  1 g Oral 4 times per day  . Chlorhexidine Gluconate Cloth  6 each Topical q morning - 10a  . feeding supplement (ENSURE COMPLETE)  237 mL Oral TID BM  . folic acid  1 mg Oral Daily  . furosemide  40 mg Oral Daily  . LORazepam  0-4 mg Intravenous Q2H while awake  . mometasone-formoterol  2 puff Inhalation BID  . multivitamin with minerals  1 tablet Oral Daily  . mupirocin ointment  1 application Nasal BID  . pantoprazole  40 mg Oral BID  . phytonadione  5 mg Oral Daily  . piperacillin-tazobactam (ZOSYN)  IV  3.375 g Intravenous Q8H  . rifaximin  550 mg Oral BID  . spironolactone  12.5 mg Oral Daily  . thiamine  100 mg Oral Daily   Or  . thiamine  100 mg Intravenous Daily  . vancomycin  1,000 mg Intravenous Q8H   Continuous Infusions: . sodium chloride 10 mL/hr at 09/02/13 0024    Principal Problem:   Severe sepsis Active Problems:   Post-operative complication   Hematoma   Coagulopathy   Acute blood loss anemia   possible HCAP (healthcare-associated pneumonia)   Leukocytosis, unspecified   Thrombocytopenia   Hyponatremia   possible encephalopathy, hepatic    Time spent: 30 min    Millie Forde, [facility]  [facility] Pager [phone]. If 7PM-7AM, please contact night-coverage at [email or web address], password [facility] 09/02/2013, 7:16 AM  LOS: 5 days

## 2013-09-02 NOTE — Progress Notes (Signed)
Called to bedside because of patient agitation.  Incoherent, pulling off wires/leads, and trying to get out of bed.  Not responding to ativan.  Still SOB on 4L [place] and wheezing.  Was recently on venturi mask.   -  Spoke with Dr. Molli Knock from critical care about decline in patient's status who recommended precedex.  Infusion ordered.   -  Monitor carefully for bradycardia and hypotension and respiratory failure on sedating medication -  Additional dose of lasix today -  Will give additional potassium also -  Change to CLD for now while confused and can advance as tolerated

## 2013-09-02 NOTE — Progress Notes (Signed)
Precedex started with 20mcg/kg bolus given. BP 124/60 prior to bolus and 120/48 preceeding bolus. Pt alert and verbal during and post bolus.

## 2013-09-02 NOTE — Progress Notes (Signed)
Nurse heard a loud thud, ran into room and saw patient on his knees in the floor beside the bed.  Two additional RN's and a nurse tech came into room to help patient up.  Patient stated he needed to have a BM, got patient to standing and put him on the bedside commode.  Patient two max assist back into bed.  Patient cursing, not following staff's instructions and working against staff as trying to help him back into bed.  Notified MD of fall and order was received for waist belt as this was felt to be the only way to keep patient in the bed at this time and prevent further fall.  Patient receiving q2 hour Ativan based on CIWA protocol.  Vital signs stable post fall, patient with no complaints of any injury after fall.  Bed alarm was in operation at time of fall, reset this once patient back in bed, call bell given to patient and re-educated on its use.  Patient confused, no evidence of learning or desire to follow staff directions at this time.  Will continue to monitor.

## 2013-09-03 DIAGNOSIS — B182 Chronic viral hepatitis C: Secondary | ICD-10-CM

## 2013-09-03 LAB — CULTURE, BLOOD (ROUTINE X 2)
Culture: NO GROWTH
Culture: NO GROWTH

## 2013-09-03 LAB — COMPREHENSIVE METABOLIC PANEL
ALT: 22 U/L (ref 0–53)
ANION GAP: 9 (ref 5–15)
AST: 40 U/L — ABNORMAL HIGH (ref 0–37)
Albumin: 1.7 g/dL — ABNORMAL LOW (ref 3.5–5.2)
Alkaline Phosphatase: 72 U/L (ref 39–117)
BILIRUBIN TOTAL: 5 mg/dL — AB (ref 0.3–1.2)
BUN: 10 mg/dL (ref 6–23)
CO2: 27 meq/L (ref 19–32)
CREATININE: 0.7 mg/dL (ref 0.50–1.35)
Calcium: 7.4 mg/dL — ABNORMAL LOW (ref 8.4–10.5)
Chloride: 99 mEq/L (ref 96–112)
GFR calc Af Amer: >90 mL/min (ref >90)
Glucose, Bld: 114 mg/dL — ABNORMAL HIGH (ref 70–99)
Potassium: 3.8 mEq/L (ref 3.7–5.3)
Sodium: 135 mEq/L — ABNORMAL LOW (ref 137–147)
Total Protein: 6 g/dL (ref 6.0–8.3)

## 2013-09-03 LAB — CBC
HCT: 28 % — ABNORMAL LOW (ref 39.0–52.0)
Hemoglobin: 9.5 g/dL — ABNORMAL LOW (ref 13.0–17.0)
MCH: 33.2 pg (ref 26.0–34.0)
MCHC: 33.9 g/dL (ref 30.0–36.0)
MCV: 97.9 fL (ref 78.0–100.0)
Platelets: 93 10*3/uL — ABNORMAL LOW (ref 150–400)
RBC: 2.86 MIL/uL — ABNORMAL LOW (ref 4.22–5.81)
RDW: 18.4 % — AB (ref 11.5–15.5)
WBC: 5.6 10*3/uL (ref 4.0–10.5)

## 2013-09-03 LAB — PROTIME-INR
INR: 2.2 — AB (ref 0.00–1.49)
PROTHROMBIN TIME: 24.4 s — AB (ref 11.6–15.2)

## 2013-09-03 MED ORDER — BOOST / RESOURCE BREEZE PO LIQD: 1.0000 | ORAL | Status: DC | PRN

## 2013-09-03 MED ORDER — SODIUM CHLORIDE 0.9 % IV SOLN: 1.0000 g | Freq: Four times a day (QID) | INTRAVENOUS | Status: DC

## 2013-09-03 MED ORDER — SODIUM CHLORIDE 0.9 % IV SOLN
Freq: Four times a day (QID) | INTRAVENOUS | Status: DC
  Administered 2013-09-03 – 2013-09-04 (×4): via INTRAVENOUS
  Filled 2013-09-03 (×9): qty 50

## 2013-09-03 NOTE — Progress Notes (Signed)
General Surgery [facility]  Agree with assessment by Zola Button.  Velora Heckler, MD, [facility] Office: [phone]

## 2013-09-03 NOTE — Progress Notes (Signed)
[facility] PROGRESS NOTE  [name] VWU:[id number] DOB: [birth year] DOA: 08/28/2013 PCP: No PCP Per Patient   HPI/Subjective: Patient is sedated with Precedex. Bradycardia and hypotension likely secondary to the Precedex. Sedation stop at 1 in the morning, patient is waking up gradually  Brief Summary  Xavier Valentine is a 53 y.o. male with past medical h/o of ETOH abuse and Hep C who underwent a right inguinal hernia repair in [place] 1 wk prior to admission.  After surgery, he moved from [place] to [place] because he was at risk of becoming homeless and needed to move in with his family. He was doing well post op until 2 days prior to admission when he states his wound began to bleed and would not stop.  In the ER, he was noted to have a significantly elevated bilirubin and an elevated INR.  After cryo, FFP, vitamin K, amicar, repeated pressure, his incision has finally started to stop bleeding.  His course has been complicated by sepsis possibly due to HCAP and he has almost completed a course of antibiotics.  He has also had hepatic encephalopathy and alcohol withdrawal.  He also became volume overloaded due to fluid resuscitation in setting of cirrhosis early in admission and is continuing to diurese.  His prognosis overall is fair to poor and his family is aware however he has poor insight into his condition.    Assessment/Plan  Right inguinal and abdominal Hematoma / Post-operative complication due to coagulopathy due to cirrhosis, continuing to have bleeding but appears to be slowing, serosanguinous currently -  Continue daily vitamin K -  Cryoprecipitate and FFP given 8/27 -  Appreciate general surgery recommendations -  Started amicar on 8/29   Severe sepsis (fever, leukocytosis, low blood pressure) due to possible HCAP, although asymptomatic vs. Infected hematoma.  Blood pressure improved -  blood cultures NGTD -  Urine culture neg -  Continue vancomycin and  zosyn.  Acute hypoxic respiratory failure likely secondary to effusions/pulmonary edema from IVF and hypoalbuminemia.  Improving.   -  Continue lasix  IV BID -  Increase spironolactone  -  Continue prn duonebs for comfort (seem to be helping greatly per RN) -  Continue dulera  Nausea, vomiting, and diarrhea, LFTs and lipase stable.  C. Diff negative.  Resolved.    Cirrhosis of liver - Hep C and Alcohol induce with resultant thrombocytopenia, coagulopathy, ascites, elevated bili, low albumin -  Cont lasix and spironactone -  Unclear if hx of varices -  minimize hepatotoxins  Ascites, not tense and non-tender  Slow speech/foggy thinking concerning for possible hepatic encephalopathy -  lactulose caused diarrhea -  Continue rifaximin  ETOH abuse with increased withdrawal symptoms over last several days.   -  Patient on Ativan every 2 hours IV, will hold while patient is sedated. -  Thiamine, folate, MVI  Cocaine positive, avoid B blockers.  Discussed health complications of Cocaine use with patient.   Leukocytosis, possibly due to pneumonia, infected hematoma, UA with rare WBC.  Resolved.  Acute blood loss anemia, incision site bleeding slowing.  Hgb approximately stable. -  Transfuse to keep hgb > 8 given ongoing bleeding  Thrombocytopenia, likely due to cirrhosis and consumption from bleeding, stable around 100K  Hypoalbuminemia, due to cirrhosis -  Maximize nutrition  Hyponatremia due to cirrhosis, improving with diuresis - Urine sodium 9 and osmolality 737  Calcium corrects with albumin  Lactic acidosis is common in setting of cirrhosis  Cachexia, severe protein  calorie malnutrition -  Supplements -  Nutrition consultation   Diet:  Low salt, 1.2L fluid Access:  PIV IVF:  off Proph:  SCD  Code Status: full Family Communication: patient alone Disposition Plan:  Continue in stepdown for now.  Patient has poor insight into condition.     Consultants:  Gen  surg  Hematology  Procedures:  CT ab/p  Antibiotics:  vanc 8/27 >>  Zosyn 8/27 >>    Objective: Filed Vitals:   09/03/13 0600 09/03/13 0700 09/03/13 0800 09/03/13 0900  BP: 107/48 108/51 102/44 101/40  Pulse: 47 46 49 52  Temp: 94.5 F (34.7 C)  96.2 F (35.7 C)   TempSrc: Rectal  Rectal   Resp: 33  Height:      Weight:      SpO2: 93% 96% 97% 96%    Intake/Output Summary (Last 24 hours) at 09/03/13 1005 Last data filed at 09/03/13 0900  Gross per 24 hour  Intake 2042.6 ml  Output   3700 ml  Net -1657.4 ml   Filed Weights   08/28/13 2338 08/31/13 0200 09/03/13 0400  Weight: 89.1 kg (196 lb 6.9 oz) 96.7 kg (213 lb 3 oz) 90.3 kg (199 lb 1.2 oz)    Exam:   General:  Cachectic WM, forced expirations, tachypnea, prolonged expiratory phase.    HEENT:  NCAT, MMM, scleral icterus   Cardiovascular:  RRR, nl S1, S2 no mrg, 2+ pulses, warm extremities  Respiratory:  Full expiratory wheeze with diminished BS at the bases, no focal rales or rhonchi.  Has a component of upper airway wheezing  Abdomen:   NABS, soft, mild to moderate distension, NT  GU:  Penis and scrotum swollen/ecchymotic, but not erythematous  MSK:   Normal tone and bulk, 1+ bilateral LEE  Neuro:  Grossly moves all extremities  Skin:  Right groin dressing with serosang drainage  Data Reviewed: Basic Metabolic Panel:  Recent Labs Lab 08/28/13 1357  08/30/13 0745 08/31/13 0334 09/01/13 0349 09/02/13 0333 09/03/13 0245  NA 127*  < > 127* 127* 130* 132* 135*  K 4.0  < > 3.6* 3.3* 3.7 3.5* 3.8  CL 90*  < > 93* 95* 95* 95* 99  CO2 20  < > GLUCOSE 88  < > 158* 89 87 104* 114*  BUN 14  < > 24* CREATININE 0.76  < > 0.92 0.76 0.80 0.76 0.70  CALCIUM 7.4*  < > 6.9* 6.8* 7.2* 7.3* 7.4*  MG 1.7  --   --   --   --   --   --   PHOS 3.1  --   --   --   --   --   --   < > = values in this interval not displayed. Liver Function Tests:  Recent Labs Lab  08/30/13 0745 08/31/13 0334 09/01/13 0349 09/02/13 0333 09/03/13 0245  AST 40* 34 44* 46* 40*  ALT ALKPHOS 97 68 82 79 72  BILITOT 6.3* 5.8* 6.1* 6.1* 5.0*  PROT 6.4 5.4* 5.9* 6.4 6.0  ALBUMIN 1.8* 1.5* 1.6* 1.8* 1.7*    Recent Labs Lab 08/28/13 1357 08/30/13 0745  LIPASE 26 33    Recent Labs Lab 08/28/13 1357 09/02/13 0520  AMMONIA 60 27   CBC:  Recent Labs Lab 08/28/13 1357  08/29/13 0420  08/30/13 0745 08/31/13 0334 09/01/13 0349 09/02/13 1610 09/03/13 0245  WBC 23.7*  --  12.2*  < > 14.1* 8.5 8.6 8.1 5.6  NEUTROABS 18.3*  --  8.9*  --   --   --   --   --   --   HGB 10.7*  --  8.8*  < > 9.7* 8.8* 9.0* 9.6* 9.5*  HCT 30.7*  --  25.2*  < > 28.6* 25.1* 25.9* 28.6* 28.0*  MCV 98.1  --  97.3  < > 99.0 96.2 96.6 99.0 97.9  PLT 139*  < > 77*  < > 103* 84* 97* 98* 93*  < > = values in this interval not displayed. Cardiac Enzymes: No results found for this basename: CKTOTAL, CKMB, CKMBINDEX, TROPONINI,  in the last 168 hours BNP (last 3 results)  Recent Labs  08/28/13 1357 09/02/13 0520  PROBNP 739.8* 348.5*   CBG: No results found for this basename: GLUCAP,  in the last 168 hours  Recent Results (from the past 240 hour(s))  CULTURE, BLOOD (ROUTINE X 2)     Status: None   Collection Time    08/28/13  1:57 PM      Result Value Ref Range Status   Specimen Description BLOOD LEFT ANTECUBITAL   Final   Special Requests BOTTLES DRAWN AEROBIC AND ANAEROBIC   Final   Culture  Setup Time     Final   Value: 08/28/2013 16:24     Performed at [facility]   Culture     Final   Value: NO GROWTH 5 DAYS     Performed at [facility]   Report Status 09/03/2013 FINAL   Final  CULTURE, BLOOD (ROUTINE X 2)     Status: None   Collection Time    08/28/13  1:57 PM      Result Value Ref Range Status   Specimen Description BLOOD BLOOD LEFT FOREARM   Final   Special Requests BOTTLES DRAWN AEROBIC AND ANAEROBIC   Final   Culture   Setup Time     Final   Value: 08/28/2013 16:24     Performed at [facility]   Culture     Final   Value: NO GROWTH 5 DAYS     Performed at [facility]   Report Status 09/03/2013 FINAL   Final  URINE CULTURE     Status: None   Collection Time    08/28/13  3:41 PM      Result Value Ref Range Status   Specimen Description URINE, CLEAN CATCH   Final   Special Requests NONE   Final   Culture  Setup Time     Final   Value: 08/28/2013 23:33     Performed at [facility] Count     Final   Value: 9,000 COLONIES/ML     Performed at [facility]   Culture     Final   Value: INSIGNIFICANT GROWTH     Performed at [facility]   Report Status 08/30/2013 FINAL   Final  MRSA PCR SCREENING     Status: Abnormal   Collection Time    08/29/13  6:11 PM      Result Value Ref Range Status   MRSA by PCR POSITIVE (*) NEGATIVE Final   Comment:            The GeneXpert MRSA Assay (FDA     approved for NASAL specimens     only), is one component of  a     comprehensive MRSA colonization     surveillance program. It is not     intended to diagnose MRSA     infection nor to guide or     monitor treatment for     MRSA infections.     RESULT CALLED TO, READ BACK BY AND VERIFIED WITH:     [facility] SHERYL RN 2050 08/29/2013 BY BOVELL,T.  CLOSTRIDIUM DIFFICILE BY PCR     Status: None   Collection Time    08/30/13  4:18 AM      Result Value Ref Range Status   C difficile by pcr NEGATIVE  NEGATIVE Final   Comment: Performed at [facility]     Studies: Dg Chest Port 1 View  09/02/2013   CLINICAL DATA:  Shortness of breath  EXAM: PORTABLE CHEST - 1 VIEW  COMPARISON:  08/30/2013  FINDINGS: Prominent cardiomediastinal contours. Central vascular congestion. Perihilar interstitial and hazy airspace opacities. Left pleural effusion has increased and predominantly collects along the periphery of the left hemithorax. Associated airspace consolidation. No  pneumothorax. No interval osseous change.  IMPRESSION: Increased central vascular congestion and perihilar interstitial and airspace opacities, favor pulmonary edema.  Increase left pleural effusion and associated airspace consolidation; atelectasis versus pneumonia.   Electronically Signed   By: Jearld Lesch M.D.   On: 09/02/2013 00:49    Scheduled Meds: . aminocaproic acid  1 g Oral 4 times per day  . antiseptic oral rinse  7 mL Mouth Rinse q12n4p  . chlorhexidine  15 mL Mouth Rinse BID  . Chlorhexidine Gluconate Cloth  6 each Topical q morning - 10a  . feeding supplement (ENSURE COMPLETE)  237 mL Oral TID BM  . folic acid  1 mg Oral Daily  . furosemide  40 mg Intravenous BID  . LORazepam  0-4 mg Intravenous Q2H while awake  . mometasone-formoterol  2 puff Inhalation BID  . multivitamin with minerals  1 tablet Oral Daily  . mupirocin ointment  1 application Nasal BID  . pantoprazole  40 mg Oral BID  . phytonadione  5 mg Oral Daily  . piperacillin-tazobactam (ZOSYN)  IV  3.375 g Intravenous Q8H  . rifaximin  550 mg Oral BID  . spironolactone  25 mg Oral Daily  . thiamine  100 mg Oral Daily   Or  . thiamine  100 mg Intravenous Daily  . vancomycin  1,000 mg Intravenous Q8H   Continuous Infusions: . sodium chloride 10 mL/hr at 09/02/13 0024  . dexmedetomidine Stopped (09/03/13 0100)    Principal Problem:   Severe sepsis Active Problems:   Post-operative complication   Hematoma   Coagulopathy   Acute blood loss anemia   possible HCAP (healthcare-associated pneumonia)   Leukocytosis, unspecified   Thrombocytopenia   Hyponatremia   possible encephalopathy, hepatic    Time spent: 30 min    [facility] A  [facility] Pager [phone]. If 7PM-7AM, please contact night-coverage at [email or web address], password [facility] 09/03/2013, 10:05 AM  LOS: 6 days

## 2013-09-03 NOTE — Progress Notes (Signed)
Subjective: He is sedated and has a posey restraints across his waist.  He didn't wake up at all when i looked at him.  That included removing several blankets and warming blanket.  His groin incision looks fine, the body ecchymosis is looking better. He is still jaundice.  Objective: Vital signs in last 24 hours: Temp:  [94.5 F (34.7 C)-97.6 F (36.4 C)] 96.2 F (35.7 C) (09/02 0800) Pulse Rate:  [46-93] 52 (09/02 0900) Resp:  [20-33] 33 (09/02 0900) BP: (79-134)/(40-113) 101/40 mmHg (09/02 0900) SpO2:  [89 %-100 %] 96 % (09/02 0900) Weight:  [90.3 kg (199 lb 1.2 oz)] 90.3 kg (199 lb 1.2 oz) (09/02 0400) Last BM Date: 09/02/13 Afebrile, in fact hypothermic, he has a warming blanket on now.  Bradycardic, and mildly hypotensive. Bilirubin is coming down, H/h is stable, WBC is normal.   Intake/Output from previous day: 09/01 0701 - 09/02 0700 In: 2412.6 [P.O.:960; I.V.:302.6; IV Piggyback:1150] Out: 3700 [Urine:3700] Intake/Output this shift: Total I/O In: 20 [I.V.:20] Out: -   General appearance: alert, cooperative and no distress Skin: jaundice, eccyhymosis with some improvement, hernia site looks fine.  Lab Results:   Recent Labs  09/02/13 0333 09/03/13 0245  WBC 8.1 5.6  HGB 9.6* 9.5*  HCT 28.6* 28.0*  PLT 98* 93*    BMET  Recent Labs  09/02/13 0333 09/03/13 0245  NA 132* 135*  K 3.5* 3.8  CL 95* 99  CO2 26 27  GLUCOSE 104* 114*  BUN 10 10  CREATININE 0.76 0.70  CALCIUM 7.3* 7.4*   PT/INR  Recent Labs  09/02/13 0333 09/03/13 0245  LABPROT 22.0* 24.4*  INR 1.92* 2.20*     Recent Labs Lab 08/30/13 0745 08/31/13 0334 09/01/13 0349 09/02/13 0333 09/03/13 0245  AST 40* 34 44* 46* 40*  ALT ALKPHOS 97 68 82 79 72  BILITOT 6.3* 5.8* 6.1* 6.1* 5.0*  PROT 6.4 5.4* 5.9* 6.4 6.0  ALBUMIN 1.8* 1.5* 1.6* 1.8* 1.7*     Lipase     Component Value Date/Time   LIPASE 33 08/30/2013 0745     Studies/Results: Dg Chest Port 1  View  09/02/2013   CLINICAL DATA:  Shortness of breath  EXAM: PORTABLE CHEST - 1 VIEW  COMPARISON:  08/30/2013  FINDINGS: Prominent cardiomediastinal contours. Central vascular congestion. Perihilar interstitial and hazy airspace opacities. Left pleural effusion has increased and predominantly collects along the periphery of the left hemithorax. Associated airspace consolidation. No pneumothorax. No interval osseous change.  IMPRESSION: Increased central vascular congestion and perihilar interstitial and airspace opacities, favor pulmonary edema.  Increase left pleural effusion and associated airspace consolidation; atelectasis versus pneumonia.   Electronically Signed   By: Jearld Lesch M.D.   On: 09/02/2013 00:49    Medications: . aminocaproic acid  1 g Oral 4 times per day  . antiseptic oral rinse  7 mL Mouth Rinse q12n4p  . chlorhexidine  15 mL Mouth Rinse BID  . Chlorhexidine Gluconate Cloth  6 each Topical q morning - 10a  . feeding supplement (ENSURE COMPLETE)  237 mL Oral TID BM  . folic acid  1 mg Oral Daily  . furosemide  40 mg Intravenous BID  . LORazepam  0-4 mg Intravenous Q2H while awake  . mometasone-formoterol  2 puff Inhalation BID  . multivitamin with minerals  1 tablet Oral Daily  . mupirocin ointment  1 application Nasal BID  . pantoprazole  40 mg Oral BID  .  phytonadione  5 mg Oral Daily  . piperacillin-tazobactam (ZOSYN)  IV  3.375 g Intravenous Q8H  . rifaximin  550 mg Oral BID  . spironolactone  25 mg Oral Daily  . thiamine  100 mg Oral Daily   Or  . thiamine  100 mg Intravenous Daily  . vancomycin  1,000 mg Intravenous Q8H    Assessment/Plan 1. Hepatitis C with probable cirrhosis  2. Coagulopathy secondary to Cirrhosis  3. Significant post op bleed into the abdomen, perineum, scrotum and lower legs' after right inguinal hernia repair. Large hematoma right inguinal hernia repair site.  4. Sepsis  5. Acute hypoxic respiratory failure with effusions/pulmonary  edema  6. Alcoholism/ongoing use/withdrawal symptoms  7. Anemia and thromocytopenia  8 . Cocaine use/durg screen positive  9. Severe malnutrition/hypoalbuminemia   Plan:  No surgical treatment needed at this time.   LOS: 6 days    Xavier Valentine 09/03/2013

## 2013-09-03 NOTE — Progress Notes (Signed)
NUTRITION FOLLOW UP  Intervention:   - D/C [employer] Instant Breakfast as pt on clear liquid diet - Resource Breeze PRN when pt awake/alert for PO and off sedation - Pt with poor PO since admission due to pain, recommend MD consider enteral nutrition if diet unable to be advanced in the next few days - RD to continue to monitor   Nutrition Dx:   Increased nutrient needs related to cirrhosis with severe sepsis as evidenced by MD notes - ongoing    Goal:   Pt to consume >90% of meals/supplements - not met  New goal: Advance diet as tolerated to low sodium diet    Monitor:   Weights, labs, diet advancement  Assessment:   Pt with past medical h/o of ETOH abuse and Hep C who underwent a right inguinal hernia repair in [place] 1 wk ago. He was doing well post op until 2 days ago when he states the wound began to bleed. It has continued to bleed for these 2 days and he therefore decided to come to the ER. He has no c/o dizziness lightheaded sensation with standing. He is noted to have a significantly elevated bilirubin and an elevated INR. Found to have right inguinal and abdominal hematoma with severe sepsis and cirrhosis with ascites.   8/28: - Pt positive for cocaine and opiates on admission  - Unable to meet with pt due to pt being transferred to ICU  - No meal intake documented today   8/31: - Met with pt who reports eating 2 meals/day PTA with good appetite - Only ate 25% of lunch today, said he couldn't eat any more due to pain, notified RN - Refused Ensure Complete over the weekend, interested in [employer], will order    9/2: - Pt with increased lethargy and confusion yesterday per chart, pt had a fall, and was agitated  - Pt sedated with Precedex this morning - Diet downgraded to clears yesterday   Total bilirubin elevated but trending down   Height: Ht Readings from Last 1 Encounters:  08/28/13 5' 11"  (1.803 m)    Weight Status:   Wt Readings  from Last 1 Encounters:  09/03/13 199 lb 1.2 oz (90.3 kg)  Admit wt         196 lb 6.9 oz (89.1 kg)  Re-estimated needs:  Kcal: 2300-2500  Protein: 120-140g  Fluid: per MD   Skin: generalized edema, Jaundice, right groin incision    Diet Order: Clear Liquid   Intake/Output Summary (Last 24 hours) at 09/03/13 1110 Last data filed at 09/03/13 0900  Gross per 24 hour  Intake 2032.6 ml  Output   3700 ml  Net -1667.4 ml    Last BM: 9/1   Labs:   Recent Labs Lab 08/28/13 1357  09/01/13 0349 09/02/13 0333 09/03/13 0245  NA 127*  < > 130* 132* 135*  K 4.0  < > 3.7 3.5* 3.8  CL 90*  < > 95* 95* 99  CO2 20  < > 24 26 27   BUN 14  < > 13 10 10   CREATININE 0.76  < > 0.80 0.76 0.70  CALCIUM 7.4*  < > 7.2* 7.3* 7.4*  MG 1.7  --   --   --   --   PHOS 3.1  --   --   --   --   GLUCOSE 88  < > 87 104* 114*  < > = values in this interval not displayed.  CBG (last  3)  No results found for this basename: GLUCAP,  in the last 72 hours  Scheduled Meds: . antiseptic oral rinse  7 mL Mouth Rinse q12n4p  . chlorhexidine  15 mL Mouth Rinse BID  . Chlorhexidine Gluconate Cloth  6 each Topical q morning - 10a  . feeding supplement (ENSURE COMPLETE)  237 mL Oral TID BM  . folic acid  1 mg Oral Daily  . furosemide  40 mg Intravenous BID  . LORazepam  0-4 mg Intravenous Q2H while awake  . mometasone-formoterol  2 puff Inhalation BID  . multivitamin with minerals  1 tablet Oral Daily  . mupirocin ointment  1 application Nasal BID  . pantoprazole  40 mg Oral BID  . phytonadione  5 mg Oral Daily  . piperacillin-tazobactam (ZOSYN)  IV  3.375 g Intravenous Q8H  . rifaximin  550 mg Oral BID  . spironolactone  25 mg Oral Daily  . thiamine  100 mg Oral Daily   Or  . thiamine  100 mg Intravenous Daily  . vancomycin  1,000 mg Intravenous [address] [place], [place], [place] [phone] Pager [phone] Weekend/After Hours Pager

## 2013-09-04 LAB — COMPREHENSIVE METABOLIC PANEL
ALT: 24 U/L (ref 0–53)
AST: 46 U/L — AB (ref 0–37)
Albumin: 1.8 g/dL — ABNORMAL LOW (ref 3.5–5.2)
Alkaline Phosphatase: 73 U/L (ref 39–117)
Anion gap: 12 (ref 5–15)
BILIRUBIN TOTAL: 5.1 mg/dL — AB (ref 0.3–1.2)
BUN: 15 mg/dL (ref 6–23)
CALCIUM: 7.5 mg/dL — AB (ref 8.4–10.5)
CHLORIDE: 95 meq/L — AB (ref 96–112)
CO2: 26 mEq/L (ref 19–32)
Creatinine, Ser: 1.08 mg/dL (ref 0.50–1.35)
GFR calc Af Amer: 87 mL/min — ABNORMAL LOW (ref >90)
GFR calc non Af Amer: 75 mL/min — ABNORMAL LOW (ref >90)
Glucose, Bld: 94 mg/dL (ref 70–99)
Potassium: 3.7 mEq/L (ref 3.7–5.3)
Sodium: 133 mEq/L — ABNORMAL LOW (ref 137–147)
Total Protein: 6.1 g/dL (ref 6.0–8.3)

## 2013-09-04 LAB — CBC
HCT: 28.7 % — ABNORMAL LOW (ref 39.0–52.0)
Hemoglobin: 9.8 g/dL — ABNORMAL LOW (ref 13.0–17.0)
MCH: 33.6 pg (ref 26.0–34.0)
MCHC: 34.1 g/dL (ref 30.0–36.0)
MCV: 98.3 fL (ref 78.0–100.0)
Platelets: 115 10*3/uL — ABNORMAL LOW (ref 150–400)
RBC: 2.92 MIL/uL — ABNORMAL LOW (ref 4.22–5.81)
RDW: 18.5 % — ABNORMAL HIGH (ref 11.5–15.5)
WBC: 8.5 10*3/uL (ref 4.0–10.5)

## 2013-09-04 LAB — PROTIME-INR
INR: 2.06 — AB (ref 0.00–1.49)
Prothrombin Time: 23.2 seconds — ABNORMAL HIGH (ref 11.6–15.2)

## 2013-09-04 LAB — VANCOMYCIN, TROUGH: VANCOMYCIN TR: 31.7 ug/mL — AB (ref 10.0–20.0)

## 2013-09-04 MED ORDER — VANCOMYCIN HCL IN DEXTROSE 1-5 GM/200ML-% IV SOLN: 1000.0000 mg | Freq: Two times a day (BID) | INTRAVENOUS | Status: DC

## 2013-09-04 MED ORDER — VANCOMYCIN HCL IN DEXTROSE 1-5 GM/200ML-% IV SOLN
1000.0000 mg | Freq: Two times a day (BID) | INTRAVENOUS | Status: DC
  Administered 2013-09-05: 1000 mg via INTRAVENOUS
  Filled 2013-09-04: qty 200

## 2013-09-04 MED ORDER — AMINOCAPROIC ACID 500 MG PO TABS
1.0000 g | ORAL_TABLET | Freq: Four times a day (QID) | ORAL | Status: DC
  Administered 2013-09-04 – 2013-09-06 (×6): 1 g via ORAL
  Filled 2013-09-04 (×12): qty 2

## 2013-09-04 MED ORDER — LORAZEPAM 2 MG/ML IJ SOLN
1.0000 mg | Freq: Once | INTRAMUSCULAR | Status: AC
  Administered 2013-09-04: 1 mg via INTRAVENOUS

## 2013-09-04 NOTE — Progress Notes (Addendum)
ANTIBIOTIC CONSULT NOTE - FOLLOW UP  Pharmacy Consult for Vancomycin/Zosyn Indication: sepsis  Allergies  Allergen Reactions  . Bee Venom Swelling    Patient Measurements: Height:  (180.3 cm) Weight: 194 lb 3.6 oz (88.1 kg) IBW/kg (Calculated) : 75.3  Vital Signs: Temp: 98.7 F (37.1 C) (09/03 0430) Temp src: Oral (09/03 0430) BP: 91/32 mmHg (09/03 1120) Pulse Rate: 66 (09/03 1120) Intake/Output from previous day: 09/02 0701 - 09/03 0700 In: 930 [P.O.:360; I.V.:120; IV Piggyback:450] Out: 1275 [Urine:1275] Intake/Output from this shift:    Labs:  Recent Labs  09/02/13 0333 09/03/13 0245 09/04/13 0333  WBC 8.1 5.6 8.5  HGB 9.6* 9.5* 9.8*  PLT 98* 93* 115*  CREATININE 0.76 0.70 1.08   Estimated Creatinine Clearance: 82.3 ml/min (by C-G formula based on Cr of 1.08).  Recent Labs  09/04/13 1201  VANCOTROUGH 31.7*     Microbiology: Recent Results (from the past 720 hour(s))  CULTURE, BLOOD (ROUTINE X 2)     Status: None   Collection Time    08/28/13  1:57 PM      Result Value Ref Range Status   Specimen Description BLOOD LEFT ANTECUBITAL   Final   Special Requests BOTTLES DRAWN AEROBIC AND ANAEROBIC   Final   Culture  Setup Time     Final   Value: 08/28/2013 16:24     Performed at [facility]   Culture     Final   Value: NO GROWTH 5 DAYS     Performed at [facility]   Report Status 09/03/2013 FINAL   Final  CULTURE, BLOOD (ROUTINE X 2)     Status: None   Collection Time    08/28/13  1:57 PM      Result Value Ref Range Status   Specimen Description BLOOD BLOOD LEFT FOREARM   Final   Special Requests BOTTLES DRAWN AEROBIC AND ANAEROBIC   Final   Culture  Setup Time     Final   Value: 08/28/2013 16:24     Performed at [facility]   Culture     Final   Value: NO GROWTH 5 DAYS     Performed at [facility]   Report Status 09/03/2013 FINAL   Final  URINE CULTURE     Status: None   Collection Time   08/28/13  3:41 PM      Result Value Ref Range Status   Specimen Description URINE, CLEAN CATCH   Final   Special Requests NONE   Final   Culture  Setup Time     Final   Value: 08/28/2013 23:33     Performed at [facility] Count     Final   Value: 9,000 COLONIES/ML     Performed at [facility]   Culture     Final   Value: INSIGNIFICANT GROWTH     Performed at [facility]   Report Status 08/30/2013 FINAL   Final  MRSA PCR SCREENING     Status: Abnormal   Collection Time    08/29/13  6:11 PM      Result Value Ref Range Status   MRSA by PCR POSITIVE (*) NEGATIVE Final   Comment:            The GeneXpert MRSA Assay (FDA     approved for NASAL specimens     only), is one component of a     comprehensive MRSA colonization  surveillance program. It is not     intended to diagnose MRSA     infection nor to guide or     monitor treatment for     MRSA infections.     RESULT CALLED TO, READ BACK BY AND VERIFIED WITH:     [facility] SHERYL RN 2050 08/29/2013 BY BOVELL,T.  CLOSTRIDIUM DIFFICILE BY PCR     Status: None   Collection Time    08/30/13  4:18 AM      Result Value Ref Range Status   C difficile by pcr NEGATIVE  NEGATIVE Final   Comment: Performed at [name]    Anti-infectives   Start     Dose/Rate Route Frequency Ordered Stop   08/30/13 2200  rifaximin (XIFAXAN) tablet 550 mg     550 mg Oral 2 times daily 08/30/13 1906     08/29/13 0400  piperacillin-tazobactam (ZOSYN) IVPB 3.375 g     3.375 g 12.5 mL/hr over 240 Minutes Intravenous Every 8 hours 08/29/13 0347     08/29/13 0400  vancomycin (VANCOCIN) IVPB 1000 mg/200 mL premix     1,000 mg 200 mL/hr over 60 Minutes Intravenous Every 8 hours 08/29/13 0347     08/28/13 1400  piperacillin-tazobactam (ZOSYN) IVPB 4.5 g  Status:  Discontinued     4.5 g 200 mL/hr over 30 Minutes Intravenous  Once 08/28/13 1352 08/28/13 1356   08/28/13 1400  vancomycin (VANCOCIN) IVPB 1000 mg/200  mL premix     1,000 mg 200 mL/hr over 60 Minutes Intravenous  Once 08/28/13 1352 08/28/13 1548   08/28/13 1400  piperacillin-tazobactam (ZOSYN) IVPB 3.375 g     3.375 g 100 mL/hr over 30 Minutes Intravenous  Once 08/28/13 1357 08/28/13 1547      Assessment: 53yo M with past medical h/o of ETOH abuse and Hep C who underwent a right inguinal hernia repair in [place] 1 wk prior to admission. He was doing well post op until 2 days prior to admission when he states the wound began to bleed. Vancomycin and Zosyn were ordered for severe sepsis (fever, leukocytosis, hypotension) due to possible HCAP (infiltrates on CT) vs infected hematoma.  8/27 >> Zosyn >> 8/27 >> Vanc >>   Tmax: AF WBCs: improved to wnl Renal: SCr - slight increase, stable CrCl 56ml/min (N), UOP appears down from previous 2 days  8/27 blood: NGF 8/27 urine: insignificant growth F 8/28 MRSA screen (+) 8/29 Cdiff PCR: neg  Drug level / dose changes info: 8/29 1900 VT:17.4_ on 1g q8h--no change 9/3 1130 VT = 31.7 mcg/ml on 1gm q8h, change to vancomycin 1gm q12h  Goal of Therapy:  Vancomycin trough level 15-20 mcg/ml Doses adjusted per renal function Eradication of infection  Plan:  Day #7 vancomycin/zosyn  Change vancomycin to 1gm IV q12h for new est Pk = 32, trough 18.5 mcg/ml  Wait additional ~18h before giving next dose to allow time for drug to clear  Continue zosyn as ordered  Follow renal function  Follow length of therapy  Juliette Alcide, PharmD, BCPS.   Pager: [phone] 09/04/2013  12:54 PM

## 2013-09-04 NOTE — Progress Notes (Signed)
In bed confused but no other distressed noted. Report given to night shift nurse

## 2013-09-04 NOTE — Progress Notes (Signed)
[facility] PROGRESS NOTE  Xavier Valentine ZOX:[id number] DOB: 1960/01/16 DOA: 08/28/2013 PCP: No PCP Per Patient   HPI/Subjective: Awake and alert today, denies any significant complaints. His creatinine increased to 1.0, hold diuretics today.  Brief Summary  Xavier Valentine is a 53 y.o. male with past medical h/o of ETOH abuse and Hep C who underwent a right inguinal hernia repair in [place] 1 wk prior to admission.  After surgery, he moved from [place] to  because he was at risk of becoming homeless and needed to move in with his family. He was doing well post op until 2 days prior to admission when he states his wound began to bleed and would not stop.  In the ER, he was noted to have a significantly elevated bilirubin and an elevated INR.  After cryo, FFP, vitamin K, amicar, repeated pressure, his incision has finally started to stop bleeding.  His course has been complicated by sepsis possibly due to HCAP and he has almost completed a course of antibiotics.  He has also had hepatic encephalopathy and alcohol withdrawal.  He also became volume overloaded due to fluid resuscitation in setting of cirrhosis early in admission and is continuing to diurese.  His prognosis overall is fair to poor and his family is aware however he has poor insight into his condition.    Assessment/Plan  Right inguinal and abdominal Hematoma / Post-operative complication due to coagulopathy due to cirrhosis, continuing to have bleeding but appears to be slowing, serosanguinous currently -  Continue daily vitamin K -  Cryoprecipitate and FFP given 8/27 -  Appreciate general surgery recommendations -  Started amicar on 8/29  - No bleeding, has massive scrotal swelling, CT reviewed, secondary to some fluids and likely hematoma as well.  Severe sepsis (fever, leukocytosis, low blood pressure) due to possible HCAP, although asymptomatic vs. Infected hematoma.  Blood pressure improved -  blood cultures  NGTD -  Urine culture neg -  Continue vancomycin and zosyn.  Acute hypoxic respiratory failure likely secondary to effusions/pulmonary edema from IVF and hypoalbuminemia.  Improving.   -  On Lasix  IV BID, hold Lasix and spell it as creatinine is increasing. -  Continue prn duonebs for comfort (seem to be helping greatly per RN) -  Continue dulera  Nausea, vomiting, and diarrhea, LFTs and lipase stable.  C. Diff negative.  Resolved.    Cirrhosis of liver - Hep C and Alcohol induce with resultant thrombocytopenia, coagulopathy, ascites, elevated bili, low albumin -  Cont lasix and spironactone -  Unclear if hx of varices -  minimize hepatotoxins  Ascites, not tense and non-tender  Possible hepatic encephalopathy -Slow speech/foggy thinking concerning for hepatic encephalopathy -Continue rifaximin, was on lactulose, discontinued because of diarrhea.  ETOH abuse with increased withdrawal symptoms over last several days.   -  Thiamine, folate, MVI -Patient was on Ativan 2 mg IV every 2 hours, will switch that to every 4 hours as needed.  Cocaine positive, avoid B blockers.  Discussed health complications of Cocaine use with patient.   Leukocytosis, possibly due to pneumonia, infected hematoma, UA with rare WBC.  Resolved.  Acute blood loss anemia, incision site bleeding slowing.  Hgb approximately stable. -  Transfuse to keep hgb > 8 given ongoing bleeding  Thrombocytopenia, likely due to cirrhosis and consumption from bleeding, stable around 100K  Hypoalbuminemia, due to cirrhosis -  Maximize nutrition  Hyponatremia due to cirrhosis, improving with diuresis - Urine sodium 9 and osmolality  737  Calcium corrects with albumin  Lactic acidosis is common in setting of cirrhosis  Cachexia, severe protein calorie malnutrition -  Supplements -  Nutrition consultation   Diet:  Low salt, 1.2L fluid Access:  PIV IVF:  off Proph:  SCD  Code Status: full Family  Communication: patient alone Disposition Plan:  Continue in stepdown for now.  Patient has poor insight into condition.     Consultants:  Gen surg  Hematology  Procedures:  CT ab/p  Antibiotics:  vanc 8/27 >>  Zosyn 8/27 >>    Objective: Filed Vitals:   09/04/13 0430 09/04/13 0500 09/04/13 0600 09/04/13 0930  BP: 107/57 95/44 110/60   Pulse: 78 76 75   Temp: 98.7 F (37.1 C)     TempSrc: Oral     Resp: Height:      Weight: 88.1 kg (194 lb 3.6 oz)     SpO2: 97% 95% 96% 98%    Intake/Output Summary (Last 24 hours) at 09/04/13 1029 Last data filed at 09/04/13 3086  Gross per 24 hour  Intake    910 ml  Output   1275 ml  Net   -365 ml   Filed Weights   08/31/13 0200 09/03/13 0400 09/04/13 0430  Weight: 96.7 kg (213 lb 3 oz) 90.3 kg (199 lb 1.2 oz) 88.1 kg (194 lb 3.6 oz)    Exam:   General:  Cachectic WM, forced expirations, tachypnea, prolonged expiratory phase.    HEENT:  NCAT, MMM, scleral icterus   Cardiovascular:  RRR, nl S1, S2 no mrg, 2+ pulses, warm extremities  Respiratory:  Full expiratory wheeze with diminished BS at the bases, no focal rales or rhonchi.  Has a component of upper airway wheezing  Abdomen:   NABS, soft, mild to moderate distension, NT  GU:  Penis and scrotum swollen/ecchymotic, but not erythematous  MSK:   Normal tone and bulk, 1+ bilateral LEE  Neuro:  Grossly moves all extremities  Skin:  Right groin dressing with serosang drainage  Data Reviewed: Basic Metabolic Panel:  Recent Labs Lab 08/28/13 1357  08/31/13 0334 09/01/13 0349 09/02/13 0333 09/03/13 0245 09/04/13 0333  NA 127*  < > 127* 130* 132* 135* 133*  K 4.0  < > 3.3* 3.7 3.5* 3.8 3.7  CL 90*  < > 95* 95* 95* 99 95*  CO2 20  < > GLUCOSE 88  < > 89 87 104* 114* 94  BUN 14  < > CREATININE 0.76  < > 0.76 0.80 0.76 0.70 1.08  CALCIUM 7.4*  < > 6.8* 7.2* 7.3* 7.4* 7.5*  MG 1.7  --   --   --   --   --   --   PHOS  3.1  --   --   --   --   --   --   < > = values in this interval not displayed. Liver Function Tests:  Recent Labs Lab 08/31/13 0334 09/01/13 0349 09/02/13 0333 09/03/13 0245 09/04/13 0333  AST 34 44* 46* 40* 46*  ALT ALKPHOS 68 82 79 72 73  BILITOT 5.8* 6.1* 6.1* 5.0* 5.1*  PROT 5.4* 5.9* 6.4 6.0 6.1  ALBUMIN 1.5* 1.6* 1.8* 1.7* 1.8*    Recent Labs Lab 08/28/13 1357 08/30/13 0745  LIPASE 26 33    Recent Labs Lab 08/28/13 1357 09/02/13 0520  AMMONIA  60 27   CBC:  Recent Labs Lab 08/28/13 1357  08/29/13 0420  08/31/13 0334 09/01/13 0349 09/02/13 0333 09/03/13 0245 09/04/13 0333  WBC 23.7*  --  12.2*  < > 8.5 8.6 8.1 5.6 8.5  NEUTROABS 18.3*  --  8.9*  --   --   --   --   --   --   HGB 10.7*  --  8.8*  < > 8.8* 9.0* 9.6* 9.5* 9.8*  HCT 30.7*  --  25.2*  < > 25.1* 25.9* 28.6* 28.0* 28.7*  MCV 98.1  --  97.3  < > 96.2 96.6 99.0 97.9 98.3  PLT 139*  < > 77*  < > 84* 97* 98* 93* 115*  < > = values in this interval not displayed. Cardiac Enzymes: No results found for this basename: CKTOTAL, CKMB, CKMBINDEX, TROPONINI,  in the last 168 hours BNP (last 3 results)  Recent Labs  08/28/13 1357 09/02/13 0520  PROBNP 739.8* 348.5*   CBG: No results found for this basename: GLUCAP,  in the last 168 hours  Recent Results (from the past 240 hour(s))  CULTURE, BLOOD (ROUTINE X 2)     Status: None   Collection Time    08/28/13  1:57 PM      Result Value Ref Range Status   Specimen Description BLOOD LEFT ANTECUBITAL   Final   Special Requests BOTTLES DRAWN AEROBIC AND ANAEROBIC   Final   Culture  Setup Time     Final   Value: 08/28/2013 16:24     Performed at [facility]   Culture     Final   Value: NO GROWTH 5 DAYS     Performed at [facility]   Report Status 09/03/2013 FINAL   Final  CULTURE, BLOOD (ROUTINE X 2)     Status: None   Collection Time    08/28/13  1:57 PM      Result Value Ref Range Status   Specimen  Description BLOOD BLOOD LEFT FOREARM   Final   Special Requests BOTTLES DRAWN AEROBIC AND ANAEROBIC   Final   Culture  Setup Time     Final   Value: 08/28/2013 16:24     Performed at [facility]   Culture     Final   Value: NO GROWTH 5 DAYS     Performed at [facility]   Report Status 09/03/2013 FINAL   Final  URINE CULTURE     Status: None   Collection Time    08/28/13  3:41 PM      Result Value Ref Range Status   Specimen Description URINE, CLEAN CATCH   Final   Special Requests NONE   Final   Culture  Setup Time     Final   Value: 08/28/2013 23:33     Performed at [facility] Count     Final   Value: 9,000 COLONIES/ML     Performed at [facility]   Culture     Final   Value: INSIGNIFICANT GROWTH     Performed at [facility]   Report Status 08/30/2013 FINAL   Final  MRSA PCR SCREENING     Status: Abnormal   Collection Time    08/29/13  6:11 PM      Result Value Ref Range Status   MRSA by PCR POSITIVE (*) NEGATIVE Final   Comment:  The GeneXpert MRSA Assay (FDA     approved for NASAL specimens     only), is one component of a     comprehensive MRSA colonization     surveillance program. It is not     intended to diagnose MRSA     infection nor to guide or     monitor treatment for     MRSA infections.     RESULT CALLED TO, READ BACK BY AND VERIFIED WITH:     [facility] SHERYL RN 2050 08/29/2013 BY BOVELL,T.  CLOSTRIDIUM DIFFICILE BY PCR     Status: None   Collection Time    08/30/13  4:18 AM      Result Value Ref Range Status   C difficile by pcr NEGATIVE  NEGATIVE Final   Comment: Performed at [facility]     Studies: No results found.  Scheduled Meds: . antiseptic oral rinse  7 mL Mouth Rinse q12n4p  . chlorhexidine  15 mL Mouth Rinse BID  . Chlorhexidine Gluconate Cloth  6 each Topical q morning - 10a  . folic acid  1 mg Oral Daily  . furosemide  40 mg Intravenous BID  . LORazepam  0-4  mg Intravenous Q2H while awake  . mometasone-formoterol  2 puff Inhalation BID  . multivitamin with minerals  1 tablet Oral Daily  . pantoprazole  40 mg Oral BID  . phytonadione  5 mg Oral Daily  . piperacillin-tazobactam (ZOSYN)  IV  3.375 g Intravenous Q8H  . rifaximin  550 mg Oral BID  . sodium chloride 0.9 % 50 mL with aminocaproic acid (AMICAR) 1 g infusion   Intravenous 4 times per day  . spironolactone  25 mg Oral Daily  . thiamine  100 mg Oral Daily   Or  . thiamine  100 mg Intravenous Daily  . vancomycin  1,000 mg Intravenous Q8H   Continuous Infusions: . sodium chloride 10 mL/hr at 09/03/13 1845  . dexmedetomidine Stopped (09/03/13 0100)    Principal Problem:   Severe sepsis Active Problems:   Post-operative complication   Hematoma   Coagulopathy   Acute blood loss anemia   possible HCAP (healthcare-associated pneumonia)   Leukocytosis, unspecified   Thrombocytopenia   Hyponatremia   possible encephalopathy, hepatic    Time spent: 30 min    [facility] A  [facility] Pager [phone]. If 7PM-7AM, please contact night-coverage at [email or web address], password [facility] 09/04/2013, 10:29 AM  LOS: 7 days

## 2013-09-04 NOTE — Progress Notes (Signed)
Initial assessment completed . Pt confused and restless, foley patent , IV intact, bruises to abdomen and staples intact and clean to right lower abd.  Will monitor closely

## 2013-09-04 NOTE — Progress Notes (Signed)
CARE MANAGEMENT NOTE 09/04/2013  Patient:  Xavier Valentine, Xavier Valentine   Account Number:  [id number]  Date Initiated:  08/29/2013  Documentation initiated by:  [facility]  Subjective/Objective Assessment:   53 Y/O M ADMITTED W/ANEMIA.     Action/Plan:   FROM OUT OF TOWN-[place].   Anticipated DC Date:  09/07/2013   Anticipated DC Plan:  HOME/SELF CARE      DC Planning Services  CM consult      Choice offered to / List presented to:             Status of service:  In process, will continue to follow Medicare Important Message given?   (If response is "NO", the following Medicare IM given date fields will be blank) Date Medicare IM given:   Medicare IM given by:   Date Additional Medicare IM given:   Additional Medicare IM given by:    Discharge Disposition:    Per UR Regulation:  Reviewed for med. necessity/level of care/duration of stay  If discussed at Long Length of Stay Meetings, dates discussed:   09/02/2013  09/04/2013    Comments:  Bjorn Loser davis,RN,BSN,CCM continues to be high risk for bleeding iv amicar, iv vit.K, ecchymosis of the body is improving.  [id number] Davis,Rn,BSN,CCM: patient transfer to [facility] on [phone] due to hypotension and continued bleeding/pt is confused -hc of polysubstance and etoh abuse/has family in the area but only recently moved to this area from [facility]  08/29/13 KATHY MAHABIR RN,BSN NCM [phone] NO ANTICIPATED D/C NEEDS.

## 2013-09-05 LAB — PROTIME-INR
INR: 2 — ABNORMAL HIGH (ref 0.00–1.49)
Prothrombin Time: 22.7 seconds — ABNORMAL HIGH (ref 11.6–15.2)

## 2013-09-05 LAB — COMPREHENSIVE METABOLIC PANEL
ALBUMIN: 1.6 g/dL — AB (ref 3.5–5.2)
ALT: 22 U/L (ref 0–53)
AST: 38 U/L — ABNORMAL HIGH (ref 0–37)
Alkaline Phosphatase: 61 U/L (ref 39–117)
Anion gap: 9 (ref 5–15)
BILIRUBIN TOTAL: 4.5 mg/dL — AB (ref 0.3–1.2)
BUN: 14 mg/dL (ref 6–23)
CHLORIDE: 95 meq/L — AB (ref 96–112)
CO2: 27 meq/L (ref 19–32)
Calcium: 7.7 mg/dL — ABNORMAL LOW (ref 8.4–10.5)
Creatinine, Ser: 1.09 mg/dL (ref 0.50–1.35)
GFR calc Af Amer: 87 mL/min — ABNORMAL LOW (ref >90)
GFR, EST NON AFRICAN AMERICAN: 75 mL/min — AB (ref >90)
Glucose, Bld: 96 mg/dL (ref 70–99)
POTASSIUM: 3.5 meq/L — AB (ref 3.7–5.3)
SODIUM: 131 meq/L — AB (ref 137–147)
Total Protein: 5.9 g/dL — ABNORMAL LOW (ref 6.0–8.3)

## 2013-09-05 LAB — CBC
HEMATOCRIT: 27.3 % — AB (ref 39.0–52.0)
Hemoglobin: 9.2 g/dL — ABNORMAL LOW (ref 13.0–17.0)
MCH: 33.2 pg (ref 26.0–34.0)
MCHC: 33.7 g/dL (ref 30.0–36.0)
MCV: 98.6 fL (ref 78.0–100.0)
PLATELETS: 98 10*3/uL — AB (ref 150–400)
RBC: 2.77 MIL/uL — ABNORMAL LOW (ref 4.22–5.81)
RDW: 18.4 % — AB (ref 11.5–15.5)
WBC: 5.5 10*3/uL (ref 4.0–10.5)

## 2013-09-05 MED ORDER — LORAZEPAM 2 MG/ML IJ SOLN
1.0000 mg | Freq: Four times a day (QID) | INTRAMUSCULAR | Status: DC | PRN
  Administered 2013-09-05 – 2013-09-09 (×8): 1 mg via INTRAVENOUS
  Filled 2013-09-05 (×9): qty 1

## 2013-09-05 NOTE — Progress Notes (Signed)
OT Cancellation Note  Patient Details Name: Xavier Valentine MRN: [id number] DOB: 07-05-1960   Cancelled Treatment:    Reason Eval/Treat Not Completed: Other (comment) (Pt eating dinner)  Angelene Giovanni Elco, OTR/L [phone]  09/05/2013, 3:50 PM

## 2013-09-05 NOTE — Progress Notes (Signed)
[facility] PROGRESS NOTE  Xavier Valentine ZOX:[id number] DOB: February 04, 1960 DOA: 08/28/2013 PCP: No PCP Per Patient   HPI/Subjective: Seen with the nursing staff, was sleepy this morning he received Ativan at 6 in the morning. No evidence of bleeding from his right groin area. Transfer to telemetry bed.  Brief Summary  Xavier Valentine is a 53 y.o. male with past medical h/o of ETOH abuse and Hep C who underwent a right inguinal hernia repair in [place] 1 wk prior to admission.  After surgery, he moved from [place] to [place] because he was at risk of becoming homeless and needed to move in with his family. He was doing well post op until 2 days prior to admission when he states his wound began to bleed and would not stop.  In the ER, he was noted to have a significantly elevated bilirubin and an elevated INR.  After cryo, FFP, vitamin K, amicar, repeated pressure, his incision has finally started to stop bleeding.  His course has been complicated by sepsis possibly due to HCAP and he has almost completed a course of antibiotics.  He has also had hepatic encephalopathy and alcohol withdrawal.  He also became volume overloaded due to fluid resuscitation in setting of cirrhosis early in admission and is continuing to diurese.  His prognosis overall is fair to poor and his family is aware however he has poor insight into his condition.    Assessment/Plan  Right inguinal and abdominal Hematoma  -Post-operative complication due to coagulopathy secondary to cirrhosis. - Cryoprecipitate and FFP given 8/27 - Appreciate general surgery recommendations - Started amicar on 8/29 and continue daily vitamin K. - No bleeding, has massive scrotal swelling, CT reviewed, secondary to some fluids and likely hematoma as well.  Severe sepsis  -(fever, leukocytosis, low blood pressure) due to possible HCAP, although asymptomatic vs. Infected hematoma.   -Urine and blood cultures are negative. - Patient is on  vancomycin and Zosyn since 8/28, completed 7 days I will discontinue her watch off of antibiotics.  Acute hypoxic respiratory failure -Likely secondary to effusions/pulmonary edema from IVF and hypoalbuminemia.  Improving.   -Was on Lasix and held because of increasing creatinine, start lower dose of Lasix -Continue prn duonebs for comfort (seem to be helping greatly per RN) - Continue dulera  Nausea, vomiting, and diarrhea, LFTs and lipase stable.  C. Diff negative.  Resolved.    Cirrhosis of liver  -Hep C and Alcohol induce with resultant thrombocytopenia, coagulopathy, ascites, elevated bili, low albumin -Restart Lasix and spironolactone. -Minimize hepatotoxins use. -Hyponatremia, hypoalbuminemia, from cytopenia and hyperbilirubinemia secondary to cirrhosis of the liver.  Ascites, not tense and non-tender  Possible hepatic encephalopathy -Slow speech/foggy thinking concerning for hepatic encephalopathy -Continue rifaximin, was on lactulose, discontinued because of diarrhea.  ETOH abuse  -Patient went through significant withdrawal, improving now will decrease Ativan. -Continue Thiamine, folate, MVI  Cocaine positive, avoid B blockers.  Discussed health complications of Cocaine use with patient.   Leukocytosis, possibly due to pneumonia, infected hematoma, UA with rare WBC.  Resolved.  Acute blood loss anemia, incision site bleeding slowing.  Hgb approximately stable. -  Transfuse to keep hgb > 8 given ongoing bleeding  Calcium corrects with albumin  Lactic acidosis is common in setting of cirrhosis  Cachexia, severe protein calorie malnutrition -  Supplements -  Nutrition consultation   Diet:  Low salt, 1.2L fluid Access:  PIV IVF:  off Proph:  SCD  Code Status: full Family Communication: patient  alone Disposition Plan:  Continue in stepdown for now.  Patient has poor insight into condition.     Consultants:  Gen surg  Hematology  Procedures:  CT  ab/p  Antibiotics:  vanc 8/27 >>  Zosyn 8/27 >>    Objective: Filed Vitals:   09/05/13 0010 09/05/13 0400 09/05/13 0800 09/05/13 0823  BP:  101/59 111/65   Pulse: 78 71    Temp:  98.7 F (37.1 C) 97.8 F (36.6 C)   TempSrc:  Oral Oral   Resp: 23 27    Height:      Weight:  89.1 kg (196 lb 6.9 oz)    SpO2: 93% 92%  100%    Intake/Output Summary (Last 24 hours) at 09/05/13 1053 Last data filed at 09/05/13 0629  Gross per 24 hour  Intake 362.67 ml  Output   1900 ml  Net -1537.33 ml   Filed Weights   09/03/13 0400 09/04/13 0430 09/05/13 0400  Weight: 90.3 kg (199 lb 1.2 oz) 88.1 kg (194 lb 3.6 oz) 89.1 kg (196 lb 6.9 oz)    Exam:   General:  Cachectic WM, forced expirations, tachypnea, prolonged expiratory phase.    HEENT:  NCAT, MMM, scleral icterus   Cardiovascular:  RRR, nl S1, S2 no mrg, 2+ pulses, warm extremities  Respiratory:  Full expiratory wheeze with diminished BS at the bases, no focal rales or rhonchi.  Has a component of upper airway wheezing  Abdomen:   NABS, soft, mild to moderate distension, NT  GU:  Penis and scrotum swollen/ecchymotic, but not erythematous  MSK:   Normal tone and bulk, 1+ bilateral LEE  Neuro:  Grossly moves all extremities  Skin:  Right groin dressing with serosang drainage  Data Reviewed: Basic Metabolic Panel:  Recent Labs Lab 09/01/13 0349 09/02/13 0333 09/03/13 0245 09/04/13 0333 09/05/13 0335  NA 130* 132* 135* 133* 131*  K 3.7 3.5* 3.8 3.7 3.5*  CL 95* 95* 99 95* 95*  CO2 GLUCOSE 87 104* 114* 94 96  BUN CREATININE 0.80 0.76 0.70 1.08 1.09  CALCIUM 7.2* 7.3* 7.4* 7.5* 7.7*   Liver Function Tests:  Recent Labs Lab 09/01/13 0349 09/02/13 0333 09/03/13 0245 09/04/13 0333 09/05/13 0335  AST 44* 46* 40* 46* 38*  ALT ALKPHOS 82 79 72 73 61  BILITOT 6.1* 6.1* 5.0* 5.1* 4.5*  PROT 5.9* 6.4 6.0 6.1 5.9*  ALBUMIN 1.6* 1.8* 1.7* 1.8* 1.6*    Recent  Labs Lab 08/30/13 0745  LIPASE 33    Recent Labs Lab 09/02/13 0520  AMMONIA 27   CBC:  Recent Labs Lab 09/01/13 0349 09/02/13 0333 09/03/13 0245 09/04/13 0333 09/05/13 0335  WBC 8.6 8.1 5.6 8.5 5.5  HGB 9.0* 9.6* 9.5* 9.8* 9.2*  HCT 25.9* 28.6* 28.0* 28.7* 27.3*  MCV 96.6 99.0 97.9 98.3 98.6  PLT 97* 98* 93* 115* 98*   Cardiac Enzymes: No results found for this basename: CKTOTAL, CKMB, CKMBINDEX, TROPONINI,  in the last 168 hours BNP (last 3 results)  Recent Labs  08/28/13 1357 09/02/13 0520  PROBNP 739.8* 348.5*   CBG: No results found for this basename: GLUCAP,  in the last 168 hours  Recent Results (from the past 240 hour(s))  CULTURE, BLOOD (ROUTINE X 2)     Status: None   Collection Time    08/28/13  1:57 PM      Result Value Ref  Range Status   Specimen Description BLOOD LEFT ANTECUBITAL   Final   Special Requests BOTTLES DRAWN AEROBIC AND ANAEROBIC   Final   Culture  Setup Time     Final   Value: 08/28/2013 16:24     Performed at [facility]   Culture     Final   Value: NO GROWTH 5 DAYS     Performed at [facility]   Report Status 09/03/2013 FINAL   Final  CULTURE, BLOOD (ROUTINE X 2)     Status: None   Collection Time    08/28/13  1:57 PM      Result Value Ref Range Status   Specimen Description BLOOD BLOOD LEFT FOREARM   Final   Special Requests BOTTLES DRAWN AEROBIC AND ANAEROBIC   Final   Culture  Setup Time     Final   Value: 08/28/2013 16:24     Performed at [facility]   Culture     Final   Value: NO GROWTH 5 DAYS     Performed at [facility]   Report Status 09/03/2013 FINAL   Final  URINE CULTURE     Status: None   Collection Time    08/28/13  3:41 PM      Result Value Ref Range Status   Specimen Description URINE, CLEAN CATCH   Final   Special Requests NONE   Final   Culture  Setup Time     Final   Value: 08/28/2013 23:33     Performed at [facility] Count     Final    Value: 9,000 COLONIES/ML     Performed at [facility]   Culture     Final   Value: INSIGNIFICANT GROWTH     Performed at [facility]   Report Status 08/30/2013 FINAL   Final  MRSA PCR SCREENING     Status: Abnormal   Collection Time    08/29/13  6:11 PM      Result Value Ref Range Status   MRSA by PCR POSITIVE (*) NEGATIVE Final   Comment:            The GeneXpert MRSA Assay (FDA     approved for NASAL specimens     only), is one component of a     comprehensive MRSA colonization     surveillance program. It is not     intended to diagnose MRSA     infection nor to guide or     monitor treatment for     MRSA infections.     RESULT CALLED TO, READ BACK BY AND VERIFIED WITH:     [facility] SHERYL RN 2050 08/29/2013 BY BOVELL,T.  CLOSTRIDIUM DIFFICILE BY PCR     Status: None   Collection Time    08/30/13  4:18 AM      Result Value Ref Range Status   C difficile by pcr NEGATIVE  NEGATIVE Final   Comment: Performed at [facility]     Studies: No results found.  Scheduled Meds: . aminocaproic acid  1 g Oral 4 times per day  . antiseptic oral rinse  7 mL Mouth Rinse q12n4p  . chlorhexidine  15 mL Mouth Rinse BID  . Chlorhexidine Gluconate Cloth  6 each Topical q morning - 10a  . folic acid  1 mg Oral Daily  . LORazepam  0-4 mg Intravenous Q2H while awake  . mometasone-formoterol  2 puff Inhalation BID  . multivitamin with minerals  1 tablet Oral Daily  . pantoprazole  40 mg Oral BID  . phytonadione  5 mg Oral Daily  . piperacillin-tazobactam (ZOSYN)  IV  3.375 g Intravenous Q8H  . rifaximin  550 mg Oral BID  . thiamine  100 mg Oral Daily   Or  . thiamine  100 mg Intravenous Daily  . vancomycin  1,000 mg Intravenous Q12H   Continuous Infusions: . sodium chloride 10 mL/hr at 09/03/13 1845  . dexmedetomidine Stopped (09/03/13 0100)    Principal Problem:   Severe sepsis Active Problems:   Post-operative complication   Hematoma   Coagulopathy    Acute blood loss anemia   possible HCAP (healthcare-associated pneumonia)   Leukocytosis, unspecified   Thrombocytopenia   Hyponatremia   possible encephalopathy, hepatic    Time spent: 30 min    [facility] A  [facility] Pager [phone]. If 7PM-7AM, please contact night-coverage at [email or web address], password [facility] 09/05/2013, 10:53 AM  LOS: 8 days

## 2013-09-05 NOTE — Progress Notes (Signed)
Report called to Boneta Lucks, RN for transfer to 1420.

## 2013-09-05 NOTE — Progress Notes (Signed)
PT Cancellation Note  Patient Details Name: Xavier Valentine MRN: [id number] DOB: Jul 04, 1960   Cancelled Treatment:    Reason Eval/Treat Not Completed: Patient declined, no reason specified--states he may try on tomorrow. Will check back.    Rebeca Alert, MPT Pager: [phone]

## 2013-09-06 LAB — CBC
HCT: 28 % — ABNORMAL LOW (ref 39.0–52.0)
Hemoglobin: 9.5 g/dL — ABNORMAL LOW (ref 13.0–17.0)
MCH: 33.8 pg (ref 26.0–34.0)
MCHC: 33.9 g/dL (ref 30.0–36.0)
MCV: 99.6 fL (ref 78.0–100.0)
PLATELETS: 109 10*3/uL — AB (ref 150–400)
RBC: 2.81 MIL/uL — ABNORMAL LOW (ref 4.22–5.81)
RDW: 17.9 % — AB (ref 11.5–15.5)
WBC: 6.1 10*3/uL (ref 4.0–10.5)

## 2013-09-06 LAB — COMPREHENSIVE METABOLIC PANEL
ALK PHOS: 64 U/L (ref 39–117)
ALT: 21 U/L (ref 0–53)
ANION GAP: 10 (ref 5–15)
AST: 38 U/L — ABNORMAL HIGH (ref 0–37)
Albumin: 1.8 g/dL — ABNORMAL LOW (ref 3.5–5.2)
BUN: 12 mg/dL (ref 6–23)
CALCIUM: 7.7 mg/dL — AB (ref 8.4–10.5)
CO2: 27 meq/L (ref 19–32)
Chloride: 96 mEq/L (ref 96–112)
Creatinine, Ser: 0.97 mg/dL (ref 0.50–1.35)
GFR calc Af Amer: >90 mL/min (ref >90)
Glucose, Bld: 90 mg/dL (ref 70–99)
POTASSIUM: 3.9 meq/L (ref 3.7–5.3)
SODIUM: 133 meq/L — AB (ref 137–147)
TOTAL PROTEIN: 6.3 g/dL (ref 6.0–8.3)
Total Bilirubin: 4.2 mg/dL — ABNORMAL HIGH (ref 0.3–1.2)

## 2013-09-06 LAB — PROTIME-INR
INR: 1.92 — AB (ref 0.00–1.49)
PROTHROMBIN TIME: 22 s — AB (ref 11.6–15.2)

## 2013-09-06 MED ORDER — FUROSEMIDE 10 MG/ML IJ SOLN
40.0000 mg | Freq: Every day | INTRAMUSCULAR | Status: DC
  Administered 2013-09-06 – 2013-09-07 (×2): 40 mg via INTRAVENOUS
  Filled 2013-09-06 (×2): qty 4

## 2013-09-06 MED ORDER — LACTULOSE 10 GM/15ML PO SOLN
20.0000 g | Freq: Two times a day (BID) | ORAL | Status: DC
  Administered 2013-09-06 – 2013-09-08 (×3): 20 g via ORAL
  Filled 2013-09-06 (×8): qty 30

## 2013-09-06 MED ORDER — OXYCODONE HCL 5 MG PO TABS
5.0000 mg | ORAL_TABLET | ORAL | Status: DC | PRN
  Administered 2013-09-06 – 2013-09-10 (×6): 5 mg via ORAL
  Filled 2013-09-06 (×6): qty 1

## 2013-09-06 NOTE — Evaluation (Signed)
Physical Therapy Evaluation Patient Details Name: Xavier Valentine MRN: [id number] DOB: 1960-06-03 Today's Date: 09/06/2013   History of Present Illness  Xavier Valentine is a 53 y.o. male with past medical h/o of ETOH abuse and Hep C who underwent a right inguinal hernia repair in [place] 1 wk ago. He was admitted due to wound had begun to bleed.   Clinical Impression  Pt pleasant and cooperative but with impulsive and unsafe behaviors greatly elevating fall risk.  At this time, pt requires assist of 2 for assistance and safety in all mobility tasks and is limited by pain, generalized weakness/deconditioning, and questionable insight into current deficits and safety awareness.  Pt would benefit from follow up rehab at SNF level.    Follow Up Recommendations SNF    Equipment Recommendations  None recommended by PT    Recommendations for Other Services OT consult     Precautions / Restrictions Precautions Precautions: Fall Restrictions Weight Bearing Restrictions: No      Mobility  Bed Mobility Overal bed mobility: Needs Assistance;+ 2 for safety/equipment Bed Mobility: Supine to Sit     Supine to sit: Min assist     General bed mobility comments: to partially roll onto side, reach for rail and bring trunk to upright. Multimodal cues for safety and completion.  Transfers Overall transfer level: Needs assistance Equipment used: None Transfers: Sit to/from Stand Sit to Stand: +2 physical assistance;Mod assist         General transfer comment: assist to rise and stabilize, pt unsteady.  Ambulation/Gait Ambulation/Gait assistance: Mod assist;+2 physical assistance Ambulation Distance (Feet): 14 Feet Assistive device: Rolling walker (2 wheeled) Gait Pattern/deviations: Decreased step length - right;Decreased step length - left;Step-through pattern;Shuffle;Trunk flexed     General Gait Details: Constant cues for saftey awareness.  Instability all directions sans AD -  partially compensated with use of RW and constand cues for posture, position from RW and basic saftey awareness  Stairs            Wheelchair Mobility    Modified Rankin (Stroke Patients Only)       Balance Overall balance assessment: Needs assistance Sitting-balance support: Bilateral upper extremity supported;Feet supported Sitting balance-Leahy Scale: Fair Sitting balance - Comments: Poor saftey awarenss with impulsive movements at EOB increasing risk of falls Postural control: Posterior lean Standing balance support: Bilateral upper extremity supported Standing balance-Leahy Scale: Poor Standing balance comment: impulsive movements increasing fall risk                             Pertinent Vitals/Pain Pain Assessment: 0-10 Pain Score: 8  Pain Location: abdomen Pain Descriptors / Indicators: Aching Pain Intervention(s): Patient requesting pain meds-RN notified;Repositioned    Home Living Family/patient expects to be discharged to:: Skilled nursing facility Living Arrangements: Alone   Type of Home: Mobile home           Additional Comments: Pt unreliable historian    Prior Function Level of Independence: Independent         Comments: per pt he states he was able to [facility] independently and perform self care but not sure if pt is a reliable. historian. No family present. Per chart pt moved here to be closer to family. Unclear if family lives with or near pt.     Hand Dominance        Extremity/Trunk Assessment   Upper Extremity Assessment: Generalized weakness  Lower Extremity Assessment: Generalized weakness      Cervical / Trunk Assessment: Other exceptions  Communication   Communication: Other (comment) (difficult to understand at times.)  Cognition Arousal/Alertness: Awake/alert Behavior During Therapy: Impulsive Overall Cognitive Status: No family/caregiver present to determine baseline cognitive functioning                       General Comments General comments (skin integrity, edema, etc.): min guard to min assist sitting EOB due to pt impulsive and doesnt give notice of starting to move.    Exercises        Assessment/Plan    PT Assessment Patient needs continued PT services  PT Diagnosis Difficulty walking   PT Problem List Decreased strength;Decreased activity tolerance;Decreased balance;Decreased mobility;Decreased cognition;Decreased knowledge of use of DME;Obesity;Pain  PT Treatment Interventions DME instruction;Gait training;Stair training;Functional mobility training;Balance training;Therapeutic exercise;Therapeutic activities;Cognitive remediation;Patient/family education   PT Goals (Current goals can be found in the Care Plan section) Acute Rehab PT Goals Patient Stated Goal: agreeable to get up wtih therapy PT Goal Formulation: With patient Time For Goal Achievement: 09/13/13 Potential to Achieve Goals: Fair    Frequency Min 3X/week   Barriers to discharge Decreased caregiver support Unsure of assist level at home based on pt report    Co-evaluation PT/OT/SLP Co-Evaluation/Treatment: Yes Reason for Co-Treatment: For patient/therapist safety PT goals addressed during session: Mobility/safety with mobility OT goals addressed during session: ADL's and self-care       End of Session Equipment Utilized During Treatment: Gait belt Activity Tolerance: Patient limited by fatigue;Patient limited by pain Patient left: in chair;with call bell/phone within reach;with nursing/sitter in room Nurse Communication: Mobility status         Time: 1610-9604 PT Time Calculation (min): 20 min   Charges:   PT Evaluation $Initial PT Evaluation Tier I: 1 Procedure     PT G Codes:          Jacelyn Cuen 09/06/2013, 2:17 PM

## 2013-09-06 NOTE — Progress Notes (Signed)
[facility] PROGRESS NOTE  Xavier Valentine ZOX:[id number] DOB: [birth year] DOA: 08/28/2013 PCP: No PCP Per Patient   HPI/Subjective: Seen with safe to sit at bedside, according to the staff patient was confused last night. Has to have an event earlier today.  The confusion he is getting is likely not from alcohol withdrawal as he's been 8 days in the hospital .  Brief Summary  Xavier Valentine is a 53 y.o. male with past medical h/o of ETOH abuse and Hep C who underwent a right inguinal hernia repair in [place] 1 wk prior to admission.  After surgery, he moved from [place] to [place] because he was at risk of becoming homeless and needed to move in with his family. He was doing well post op until 2 days prior to admission when he states his wound began to bleed and would not stop.  In the ER, he was noted to have a significantly elevated bilirubin and an elevated INR.  After cryo, FFP, vitamin K, amicar, repeated pressure, his incision has finally started to stop bleeding.  His course has been complicated by sepsis possibly due to HCAP and he has almost completed a course of antibiotics.  He has also had hepatic encephalopathy and alcohol withdrawal.  He also became volume overloaded due to fluid resuscitation in setting of cirrhosis early in admission and is continuing to diurese.  His prognosis overall is fair to poor and his family is aware however he has poor insight into his condition.    Assessment/Plan  Right inguinal and abdominal Hematoma  -Post-operative complication due to coagulopathy secondary to cirrhosis. -Cryoprecipitate and FFP given 8/27 -Appreciate general surgery recommendations -Started amicar on 8/29 and continue daily vitamin K. -No bleeding, has massive scrotal swelling, CT reviewed, secondary to some fluids and likely hematoma as well.  Severe sepsis  -(fever, leukocytosis, low blood pressure) due to possible HCAP, although asymptomatic vs. Infected hematoma.    -Urine and blood cultures are negative. -Patient is on vancomycin and Zosyn since 8/28, antibiotics discontinued on 9/4 after completed 7 days, watch off of antibiotics.  Acute hypoxic respiratory failure -Likely secondary to effusions/pulmonary edema from IVF and hypoalbuminemia.  Improving.   -Was on Lasix and held because of increasing creatinine, start lower dose of Lasix -Continue prn duonebs for comfort (seem to be helping greatly per RN) - Continue dulera  Nausea, vomiting, and diarrhea, LFTs and lipase stable.  C. Diff negative.  Resolved.    Cirrhosis of liver  -Hep C and Alcohol induce with resultant thrombocytopenia, coagulopathy, ascites, elevated bili, low albumin -Restart Lasix and spironolactone. -Minimize hepatotoxins use. -Hyponatremia, hypoalbuminemia, from cytopenia and hyperbilirubinemia secondary to cirrhosis of the liver.  Ascites, not tense and non-tender  Possible hepatic encephalopathy -Slow speech/foggy thinking concerning for hepatic encephalopathy -Continue rifaximin, I will restart lactulose.  ETOH abuse  -Patient went through significant withdrawal, improving now will decrease Ativan. -Continue Thiamine, folate, MVI  Cocaine positive, avoid B blockers.  Discussed health complications of Cocaine use with patient.   Leukocytosis, possibly due to pneumonia, infected hematoma, UA with rare WBC.  Resolved.  Acute blood loss anemia, incision site bleeding slowing.  Hgb approximately stable. -  Transfuse to keep hgb > 8 given ongoing bleeding  Calcium corrects with albumin  Lactic acidosis is common in setting of cirrhosis  Cachexia, severe protein calorie malnutrition -  Supplements -  Nutrition consultation   Diet:  Low salt, 1.2L fluid Access:  PIV IVF:  off Proph:  SCD  Code Status: full Family Communication: patient alone Disposition Plan:  Continue in stepdown for now.  Patient has poor insight into condition.     Consultants:  Gen  surg  Hematology  Procedures:  CT ab/p  Antibiotics:  vanc 8/27 >>  Zosyn 8/27 >>    Objective: Filed Vitals:   09/05/13 2012 09/05/13 2021 09/06/13 0608 09/06/13 0744  BP:  119/70 121/75   Pulse:  77 75   Temp:  98.2 F (36.8 C) 98.3 F (36.8 C)   TempSrc:  Oral Oral   Resp:  20 18   Height:      Weight:      SpO2: 95% 99% 93% 98%    Intake/Output Summary (Last 24 hours) at 09/06/13 1207 Last data filed at 09/06/13 0934  Gross per 24 hour  Intake   1200 ml  Output   1075 ml  Net    125 ml   Filed Weights   09/03/13 0400 09/04/13 0430 09/05/13 0400  Weight: 90.3 kg (199 lb 1.2 oz) 88.1 kg (194 lb 3.6 oz) 89.1 kg (196 lb 6.9 oz)    Exam:   General:  Cachectic WM, forced expirations, tachypnea, prolonged expiratory phase.    HEENT:  NCAT, MMM, scleral icterus   Cardiovascular:  RRR, nl S1, S2 no mrg, 2+ pulses, warm extremities  Respiratory:  Full expiratory wheeze with diminished BS at the bases, no focal rales or rhonchi.  Has a component of upper airway wheezing  Abdomen:   NABS, soft, mild to moderate distension, NT  GU:  Penis and scrotum swollen/ecchymotic, but not erythematous  MSK:   Normal tone and bulk, 1+ bilateral LEE  Neuro:  Grossly moves all extremities  Skin:  Right groin dressing with serosang drainage  Data Reviewed: Basic Metabolic Panel:  Recent Labs Lab 09/02/13 0333 09/03/13 0245 09/04/13 0333 09/05/13 0335 09/06/13 0448  NA 132* 135* 133* 131* 133*  K 3.5* 3.8 3.7 3.5* 3.9  CL 95* 99 95* 95* 96  CO2 GLUCOSE 104* 114* 94 96 90  BUN CREATININE 0.76 0.70 1.08 1.09 0.97  CALCIUM 7.3* 7.4* 7.5* 7.7* 7.7*   Liver Function Tests:  Recent Labs Lab 09/02/13 0333 09/03/13 0245 09/04/13 0333 09/05/13 0335 09/06/13 0448  AST 46* 40* 46* 38* 38*  ALT ALKPHOS 79 72 73 61 64  BILITOT 6.1* 5.0* 5.1* 4.5* 4.2*  PROT 6.4 6.0 6.1 5.9* 6.3  ALBUMIN 1.8* 1.7* 1.8* 1.6* 1.8*    No results found for this basename: LIPASE, AMYLASE,  in the last 168 hours  Recent Labs Lab 09/02/13 0520  AMMONIA 27   CBC:  Recent Labs Lab 09/02/13 0333 09/03/13 0245 09/04/13 0333 09/05/13 0335 09/06/13 0448  WBC 8.1 5.6 8.5 5.5 6.1  HGB 9.6* 9.5* 9.8* 9.2* 9.5*  HCT 28.6* 28.0* 28.7* 27.3* 28.0*  MCV 99.0 97.9 98.3 98.6 99.6  PLT 98* 93* 115* 98* 109*   Cardiac Enzymes: No results found for this basename: CKTOTAL, CKMB, CKMBINDEX, TROPONINI,  in the last 168 hours BNP (last 3 results)  Recent Labs  08/28/13 1357 09/02/13 0520  PROBNP 739.8* 348.5*   CBG: No results found for this basename: GLUCAP,  in the last 168 hours  Recent Results (from the past 240 hour(s))  CULTURE, BLOOD (ROUTINE X 2)     Status: None   Collection Time    08/28/13  1:57  PM      Result Value Ref Range Status   Specimen Description BLOOD LEFT ANTECUBITAL   Final   Special Requests BOTTLES DRAWN AEROBIC AND ANAEROBIC   Final   Culture  Setup Time     Final   Value: 08/28/2013 16:24     Performed at [facility]   Culture     Final   Value: NO GROWTH 5 DAYS     Performed at [facility]   Report Status 09/03/2013 FINAL   Final  CULTURE, BLOOD (ROUTINE X 2)     Status: None   Collection Time    08/28/13  1:57 PM      Result Value Ref Range Status   Specimen Description BLOOD BLOOD LEFT FOREARM   Final   Special Requests BOTTLES DRAWN AEROBIC AND ANAEROBIC   Final   Culture  Setup Time     Final   Value: 08/28/2013 16:24     Performed at [facility]   Culture     Final   Value: NO GROWTH 5 DAYS     Performed at [facility]   Report Status 09/03/2013 FINAL   Final  URINE CULTURE     Status: None   Collection Time    08/28/13  3:41 PM      Result Value Ref Range Status   Specimen Description URINE, CLEAN CATCH   Final   Special Requests NONE   Final   Culture  Setup Time     Final   Value: 08/28/2013 23:33     Performed at  [facility] Count     Final   Value: 9,000 COLONIES/ML     Performed at [facility]   Culture     Final   Value: INSIGNIFICANT GROWTH     Performed at [facility]   Report Status 08/30/2013 FINAL   Final  MRSA PCR SCREENING     Status: Abnormal   Collection Time    08/29/13  6:11 PM      Result Value Ref Range Status   MRSA by PCR POSITIVE (*) NEGATIVE Final   Comment:            The GeneXpert MRSA Assay (FDA     approved for NASAL specimens     only), is one component of a     comprehensive MRSA colonization     surveillance program. It is not     intended to diagnose MRSA     infection nor to guide or     monitor treatment for     MRSA infections.     RESULT CALLED TO, READ BACK BY AND VERIFIED WITH:     [facility] SHERYL RN 2050 08/29/2013 BY BOVELL,T.  CLOSTRIDIUM DIFFICILE BY PCR     Status: None   Collection Time    08/30/13  4:18 AM      Result Value Ref Range Status   C difficile by pcr NEGATIVE  NEGATIVE Final   Comment: Performed at [facility]     Studies: No results found.  Scheduled Meds: . aminocaproic acid  1 g Oral 4 times per day  . antiseptic oral rinse  7 mL Mouth Rinse q12n4p  . chlorhexidine  15 mL Mouth Rinse BID  . Chlorhexidine Gluconate Cloth  6 each Topical q morning - 10a  . folic acid  1 mg Oral Daily  . mometasone-formoterol  2 puff Inhalation BID  . multivitamin with minerals  1 tablet Oral Daily  . pantoprazole  40 mg Oral BID  . phytonadione  5 mg Oral Daily  . rifaximin  550 mg Oral BID  . thiamine  100 mg Oral Daily   Or  . thiamine  100 mg Intravenous Daily   Continuous Infusions: . sodium chloride 10 mL/hr at 09/03/13 1845    Principal Problem:   Severe sepsis Active Problems:   Post-operative complication   Hematoma   Coagulopathy   Acute blood loss anemia   possible HCAP (healthcare-associated pneumonia)   Leukocytosis, unspecified   Thrombocytopenia   Hyponatremia   possible  encephalopathy, hepatic    Time spent: 30 min    [facility] A  [facility] Pager [phone]. If 7PM-7AM, please contact night-coverage at [email or web address], password [facility] 09/06/2013, 12:07 PM  LOS: 9 days

## 2013-09-06 NOTE — Evaluation (Signed)
Occupational Therapy Evaluation Patient Details Name: Xavier Valentine MRN: [id number] DOB: 01/23/1960 Today's Date: 09/06/2013    History of Present Illness Xavier Valentine is a 53 y.o. male with past medical h/o of ETOH abuse and Hep C who underwent a right inguinal hernia repair in [place] 1 wk ago. He was admitted due to wound had begun to bleed.    Clinical Impression   Pt requires +2 for safety currently and is limited by pain. Nursing made aware. No family present during eval. Feel he will need SNF at d/c. Will follow on acute to progress safety and independence with self care tasks.    Follow Up Recommendations  SNF;Supervision/Assistance - 24 hour    Equipment Recommendations  3 in 1 bedside comode    Recommendations for Other Services       Precautions / Restrictions Precautions Precautions: Fall Restrictions Weight Bearing Restrictions: No      Mobility Bed Mobility Overal bed mobility: Needs Assistance;+ 2 for safety/equipment Bed Mobility: Supine to Sit     Supine to sit: Min assist     General bed mobility comments: to partially roll onto side, reach for rail and bring trunk to upright. Multimodal cues for safety and completion.  Transfers Overall transfer level: Needs assistance Equipment used: None Transfers: Sit to/from Stand Sit to Stand: +2 physical assistance;Mod assist         General transfer comment: assist to rise and stabilize, pt unsteady.    Balance                                            ADL Overall ADL's : Needs assistance/impaired Eating/Feeding: Independent;Sitting   Grooming: Wash/dry face;Brushing hair;Set up;Sitting   Upper Body Bathing: Moderate assistance;Sitting   Lower Body Bathing: +2 for physical assistance;Moderate assistance;Sit to/from stand   Upper Body Dressing : Moderate assistance;Sitting   Lower Body Dressing: +2 for physical assistance;Moderate assistance;Sit to/from stand    Toilet Transfer: +2 for physical assistance;Moderate assistance;Ambulation;RW   Toileting- Clothing Manipulation and Hygiene: +2 for physical assistance;Moderate assistance;Sit to/from stand         General ADL Comments: Pt impulsive and doesnt warning before raising up from bed, etc. Stood without walker and took a few steps but pt is unsteady so therefore used walker to continue. He demonstrated decreased safety with walker, letting go of the walker during functional transfer to reach for his phone and starting to speak into it . Pt very unsteady and required +2 for safety. Pt holding to abdomen at times and stating pain 7-8/10 and pt wanting to sit down. Chair pulled up. No family to confirm PLOF or d/c. Pt's sats 94% on RA at end of session and informed nursing that O2 left off. Note pt with some wheezing.      Vision                     Perception     Praxis      Pertinent Vitals/Pain Pain Assessment: 0-10 Pain Score: 8  Pain Location: abdomen Pain Descriptors / Indicators: Aching Pain Intervention(s): Patient requesting pain meds-RN notified;Repositioned     Hand Dominance     Extremity/Trunk Assessment Upper Extremity Assessment Upper Extremity Assessment: Generalized weakness           Communication Communication Communication: Other (comment) (difficult to understand at times.)  Cognition Arousal/Alertness: Awake/alert Behavior During Therapy: Impulsive Overall Cognitive Status: No family/caregiver present to determine baseline cognitive functioning                     General Comments       Exercises       Shoulder Instructions      Home Living Family/patient expects to be discharged to:: Skilled nursing facility Living Arrangements: Alone   Type of Home: Mobile home                                  Prior Functioning/Environment          Comments: per pt he states he was able to [facility] independently and perform  self care but not sure if pt is a reliable. historian. No family present. Per chart pt moved here to be closer to family. Unclear if family lives with or near pt.    OT Diagnosis: Generalized weakness;Acute pain   OT Problem List: Decreased strength;Decreased knowledge of use of DME or AE;Pain;Decreased cognition   OT Treatment/Interventions: Self-care/ADL training;Patient/family education;Therapeutic activities;DME and/or AE instruction    OT Goals(Current goals can be found in the care plan section) Acute Rehab OT Goals Patient Stated Goal: agreeable to get up wtih therapy OT Goal Formulation: With patient Time For Goal Achievement: 09/20/13 Potential to Achieve Goals: Good  OT Frequency: Min 2X/week   Barriers to D/C:            Co-evaluation              End of Session Equipment Utilized During Treatment: Gait belt;Rolling walker  Activity Tolerance: Patient limited by pain Patient left: in chair;with call bell/phone within reach;with nursing/sitter in room   Time: 1320-1345 OT Time Calculation (min): 25 min Charges:  OT General Charges $OT Visit: 1 Procedure OT Evaluation $Initial OT Evaluation Tier I: 1 Procedure OT Treatments $Therapeutic Activity: 8-22 mins G-Codes:    Lennox Laity [phone] 09/06/2013, 2:05 PM

## 2013-09-07 LAB — COMPREHENSIVE METABOLIC PANEL
ALBUMIN: 1.8 g/dL — AB (ref 3.5–5.2)
ALT: 20 U/L (ref 0–53)
ANION GAP: 9 (ref 5–15)
AST: 38 U/L — AB (ref 0–37)
Alkaline Phosphatase: 65 U/L (ref 39–117)
BUN: 14 mg/dL (ref 6–23)
CALCIUM: 7.9 mg/dL — AB (ref 8.4–10.5)
CO2: 28 mEq/L (ref 19–32)
Chloride: 97 mEq/L (ref 96–112)
Creatinine, Ser: 0.99 mg/dL (ref 0.50–1.35)
GFR calc Af Amer: >90 mL/min (ref >90)
GFR calc non Af Amer: >90 mL/min (ref >90)
Glucose, Bld: 94 mg/dL (ref 70–99)
Potassium: 4 mEq/L (ref 3.7–5.3)
Sodium: 134 mEq/L — ABNORMAL LOW (ref 137–147)
Total Bilirubin: 3.5 mg/dL — ABNORMAL HIGH (ref 0.3–1.2)
Total Protein: 6.5 g/dL (ref 6.0–8.3)

## 2013-09-07 LAB — CBC
HEMATOCRIT: 29.1 % — AB (ref 39.0–52.0)
Hemoglobin: 9.6 g/dL — ABNORMAL LOW (ref 13.0–17.0)
MCH: 33.4 pg (ref 26.0–34.0)
MCHC: 33 g/dL (ref 30.0–36.0)
MCV: 101.4 fL — ABNORMAL HIGH (ref 78.0–100.0)
Platelets: 101 10*3/uL — ABNORMAL LOW (ref 150–400)
RBC: 2.87 MIL/uL — AB (ref 4.22–5.81)
RDW: 17.7 % — ABNORMAL HIGH (ref 11.5–15.5)
WBC: 6.9 10*3/uL (ref 4.0–10.5)

## 2013-09-07 LAB — PROTIME-INR
INR: 2.04 — ABNORMAL HIGH (ref 0.00–1.49)
Prothrombin Time: 23 seconds — ABNORMAL HIGH (ref 11.6–15.2)

## 2013-09-07 MED ORDER — FUROSEMIDE 10 MG/ML IJ SOLN
40.0000 mg | Freq: Two times a day (BID) | INTRAMUSCULAR | Status: DC
  Administered 2013-09-07 – 2013-09-10 (×6): 40 mg via INTRAVENOUS
  Filled 2013-09-07 (×7): qty 4

## 2013-09-07 MED ORDER — SPIRONOLACTONE 100 MG PO TABS
100.0000 mg | ORAL_TABLET | Freq: Every day | ORAL | Status: DC
  Administered 2013-09-07 – 2013-09-10 (×4): 100 mg via ORAL
  Filled 2013-09-07 (×4): qty 1

## 2013-09-07 NOTE — Progress Notes (Addendum)
CSW reviewed chart and noted that PT evaluation recommending SNF at d/c.  Psychosocial assessment completed on 08/29/13 for pt following initial current substance abuse referral.   CSW met with pt at bedside to assess disposition.   CSW introduced self and explained role.   CSW inquired with pt about current living situation and support. Pt with poor eye contact and kept eyes closed during most of the conversation, but answered questions appropriately. Pt stated that he just moved from [place] and has been living with his sister, Lattie Haw. CSW discussed with pt PT evaluation recommendation for SNF at d/c for rehab. Pt states that he is agreeable. CSW inquired with pt about pt Medicaid. Pt states that he had Medicaid in [place], but has not yet applied for Medicaid in [place]. Pt requested assistance with applying for Medicaid in [place] and CSW stated that CSW will notify pt sister of need to apply for Medicaid in [place] at the Department of Social Services. Pt agreeable to CSW contacting pt sister, Lattie Haw via telephone.   CSW contacted pt sister, Lattie Haw via telephone, but unable to reach pt sister. Voicemail left.  CSW completed FL2 and initiated SNF search to multiple counties given that pt Medicaid is currently out of state and the need for pt to transition Medicaid to [place] which is a barrier to placement. Pt will likely need to be placed as a Letter of Guarantee (LOG). CSW submitted pasarr and pasarr referred for [facility] Hives Review secondary to pt history of substance abuse.   CSW to follow up with pt and continue to attempt to reach pt sister regarding disposition planning.  Addendum 4:30 pm:  CSW received return phone call from pt sister, Lattie Haw.  CSW discussed with pt sister recommendations for short term SNF placement. CSW explained to pt sister the need for pt family to assist pt with transitioning pt Medicaid to [place]. Pt sister shared that she will go to DSS on Tuesday to start this process.   CSW  discussed recommendation for short term SNF placement and barrier to placement being that pt does not have [place] Medicaid therefore [employer] initiated. Pt sister expressed understanding and stated although pt planned to live with her, pt sister works during the day and pt would be alone. Pt sister wants pt to follow the recommendations for disposition.   CSW to follow up with pt and pt sister re: bed offers once available.   CSW to continue to follow.  Alison Murray, MSW, LCSW Clinical Social Work [employer] [phone]

## 2013-09-07 NOTE — Progress Notes (Signed)
[facility] PROGRESS NOTE  Xavier Valentine ZOX:[id number] DOB: 1960-05-31 DOA: 08/28/2013 PCP: No PCP Per Patient   HPI/Subjective: Seen with safety sitter at bedside. Holding his iPhone, he is trying to call his family.  Brief Summary  Xavier Valentine is a 53 y.o. male with past medical h/o of Xavier Valentine abuse and Xavier Valentine who underwent a right inguinal hernia repair in [place] 1 wk prior to admission.  After surgery, he moved from [place] to [place] because he was at risk of becoming homeless and needed to move in with his family. He was doing well post op until 2 days prior to admission when he states his wound began to bleed and would not stop.  In the ER, he was noted to have a significantly elevated bilirubin and an elevated INR.  After cryo, FFP, vitamin K, amicar, repeated pressure, his incision has finally started to stop bleeding.  His course has been complicated by sepsis possibly due to HCAP and he has almost completed a course of antibiotics.  He has also had hepatic encephalopathy and alcohol withdrawal.  He also became volume overloaded due to fluid resuscitation in setting of cirrhosis early in admission and is continuing to diurese.  His prognosis overall is fair to poor and his family is aware however he has poor insight into his condition.    Assessment/Plan  Right inguinal and abdominal Hematoma  -Post-operative complication due to coagulopathy secondary to cirrhosis. -Cryoprecipitate and FFP given 8/27 -Appreciate general surgery recommendations -Started amicar on 8/29 and continue daily vitamin K. -No bleeding, has massive scrotal swelling, CT reviewed, secondary to some fluids and likely hematoma as well.  Severe sepsis  -(fever, leukocytosis, low blood pressure) due to possible HCAP, although asymptomatic vs. Infected hematoma.   -Urine and blood cultures are negative. -Patient is on vancomycin and Zosyn since 8/28, antibiotics discontinued on 9/4 after completed 7 days,  watch off of antibiotics.  Acute hypoxic respiratory failure -Likely secondary to effusions/pulmonary edema from IVF and hypoalbuminemia.  Improving.   -Was on Lasix and held because of increasing creatinine, increase Lasix to 40 mg IV twice a day. -Continue prn duonebs for comfort (seem to be helping greatly per RN) - Continue dulera  Nausea, vomiting, and diarrhea, LFTs and lipase stable.  Valentine. Diff negative.  Resolved.    Cirrhosis of liver  -Xavier Valentine and Alcohol induce with resultant thrombocytopenia, coagulopathy, ascites, elevated bili, low albumin -Restart Lasix and spironolactone. -Minimize hepatotoxins use. -Hyponatremia, hypoalbuminemia, from cytopenia and hyperbilirubinemia secondary to cirrhosis of the liver.  Ascites, not tense and non-tender  Possible hepatic encephalopathy -Slow speech/foggy thinking concerning for hepatic encephalopathy -Continue rifaximin, I will restart lactulose.  Xavier Valentine abuse  -Patient went through significant withdrawal, improving now will decrease Ativan. -Continue Thiamine, folate, MVI  Cocaine positive, avoid B blockers.  Discussed health complications of Cocaine use with patient.   Leukocytosis, possibly due to pneumonia, infected hematoma, UA with rare WBC.  Resolved.  Acute blood loss anemia, incision site bleeding slowing.  Hgb approximately stable. -  Transfuse to keep hgb > 8 given ongoing bleeding  Calcium corrects with albumin  Lactic acidosis is common in setting of cirrhosis  Cachexia, severe protein calorie malnutrition -  Supplements -  Nutrition consultation   Diet:  Low salt, 1.2L fluid Access:  PIV IVF:  off Proph:  SCD  Code Status: full Family Communication: patient alone Disposition Plan:  Continue in stepdown for now.  Patient has poor insight into condition.  Consultants:  Gen surg  Hematology  Procedures:  CT ab/p  Antibiotics:  vanc 8/27 >>  Zosyn 8/27 >>    Objective: Filed Vitals:    09/06/13 2141 09/06/13 2239 09/07/13 0528 09/07/13 0749  BP: 95/51  115/69   Pulse:   77   Temp: 97.7 F (36.5 Valentine)  98.4 F (36.9 Valentine)   TempSrc: Axillary  Oral   Resp: 18  16   Height:      Weight:      SpO2: 90% 99% 90% 91%    Intake/Output Summary (Last 24 hours) at 09/07/13 1152 Last data filed at 09/07/13 1000  Gross per 24 hour  Intake   1890 ml  Output   2425 ml  Net   -535 ml   Filed Weights   09/03/13 0400 09/04/13 0430 09/05/13 0400  Weight: 90.3 kg (199 lb 1.2 oz) 88.1 kg (194 lb 3.6 oz) 89.1 kg (196 lb 6.9 oz)    Exam:   General:  Cachectic WM, forced expirations, tachypnea, prolonged expiratory phase.    HEENT:  NCAT, MMM, scleral icterus   Cardiovascular:  RRR, nl S1, S2 no mrg, 2+ pulses, warm extremities  Respiratory:  Full expiratory wheeze with diminished BS at the bases, no focal rales or rhonchi.  Has a component of upper airway wheezing  Abdomen:   NABS, soft, mild to moderate distension, NT  GU:  Penis and scrotum swollen/ecchymotic, but not erythematous  MSK:   Normal tone and bulk, 1+ bilateral LEE  Neuro:  Grossly moves all extremities  Skin:  Right groin dressing with serosang drainage  Data Reviewed: Basic Metabolic Panel:  Recent Labs Lab 09/03/13 0245 09/04/13 0333 09/05/13 0335 09/06/13 0448 09/07/13 0520  NA 135* 133* 131* 133* 134*  K 3.8 3.7 3.5* 3.9 4.0  CL 99 95* 95* 96 97  CO2 GLUCOSE 114* 94 96 90 94  BUN CREATININE 0.70 1.08 1.09 0.97 0.99  CALCIUM 7.4* 7.5* 7.7* 7.7* 7.9*   Liver Function Tests:  Recent Labs Lab 09/03/13 0245 09/04/13 0333 09/05/13 0335 09/06/13 0448 09/07/13 0520  AST 40* 46* 38* 38* 38*  ALT ALKPHOS 72 73 61 64 65  BILITOT 5.0* 5.1* 4.5* 4.2* 3.5*  PROT 6.0 6.1 5.9* 6.3 6.5  ALBUMIN 1.7* 1.8* 1.6* 1.8* 1.8*   No results found for this basename: LIPASE, AMYLASE,  in the last 168 hours  Recent Labs Lab 09/02/13 0520  AMMONIA 27    CBC:  Recent Labs Lab 09/03/13 0245 09/04/13 0333 09/05/13 0335 09/06/13 0448 09/07/13 0520  WBC 5.6 8.5 5.5 6.1 6.9  HGB 9.5* 9.8* 9.2* 9.5* 9.6*  HCT 28.0* 28.7* 27.3* 28.0* 29.1*  MCV 97.9 98.3 98.6 99.6 101.4*  PLT 93* 115* 98* 109* 101*   Cardiac Enzymes: No results found for this basename: CKTOTAL, CKMB, CKMBINDEX, TROPONINI,  in the last 168 hours BNP (last 3 results)  Recent Labs  08/28/13 1357 09/02/13 0520  PROBNP 739.8* 348.5*   CBG: No results found for this basename: GLUCAP,  in the last 168 hours  Recent Results (from the past 240 hour(s))  CULTURE, BLOOD (ROUTINE X 2)     Status: None   Collection Time    08/28/13  1:57 PM      Result Value Ref Range Status   Specimen Description BLOOD LEFT ANTECUBITAL   Final   Special Requests BOTTLES DRAWN  AEROBIC AND ANAEROBIC   Final   Culture  Setup Time     Final   Value: 08/28/2013 16:24     Performed at [facility]   Culture     Final   Value: NO GROWTH 5 DAYS     Performed at [facility]   Report Status 09/03/2013 FINAL   Final  CULTURE, BLOOD (ROUTINE X 2)     Status: None   Collection Time    08/28/13  1:57 PM      Result Value Ref Range Status   Specimen Description BLOOD BLOOD LEFT FOREARM   Final   Special Requests BOTTLES DRAWN AEROBIC AND ANAEROBIC   Final   Culture  Setup Time     Final   Value: 08/28/2013 16:24     Performed at [facility]   Culture     Final   Value: NO GROWTH 5 DAYS     Performed at [facility]   Report Status 09/03/2013 FINAL   Final  URINE CULTURE     Status: None   Collection Time    08/28/13  3:41 PM      Result Value Ref Range Status   Specimen Description URINE, CLEAN CATCH   Final   Special Requests NONE   Final   Culture  Setup Time     Final   Value: 08/28/2013 23:33     Performed at [facility] Count     Final   Value: 9,000 COLONIES/ML     Performed at [facility]   Culture      Final   Value: INSIGNIFICANT GROWTH     Performed at [facility]   Report Status 08/30/2013 FINAL   Final  MRSA PCR SCREENING     Status: Abnormal   Collection Time    08/29/13  6:11 PM      Result Value Ref Range Status   MRSA by PCR POSITIVE (*) NEGATIVE Final   Comment:            The GeneXpert MRSA Assay (FDA     approved for NASAL specimens     only), is one component of a     comprehensive MRSA colonization     surveillance program. It is not     intended to diagnose MRSA     infection nor to guide or     monitor treatment for     MRSA infections.     RESULT CALLED TO, READ BACK BY AND VERIFIED WITH:     [facility] SHERYL RN 2050 08/29/2013 BY BOVELL,T.  CLOSTRIDIUM DIFFICILE BY PCR     Status: None   Collection Time    08/30/13  4:18 AM      Result Value Ref Range Status   Valentine difficile by pcr NEGATIVE  NEGATIVE Final   Comment: Performed at [facility]     Studies: No results found.  Scheduled Meds: . antiseptic oral rinse  7 mL Mouth Rinse q12n4p  . chlorhexidine  15 mL Mouth Rinse BID  . Chlorhexidine Gluconate Cloth  6 each Topical q morning - 10a  . folic acid  1 mg Oral Daily  . furosemide  40 mg Intravenous Daily  . lactulose  20 g Oral BID  . mometasone-formoterol  2 puff Inhalation BID  . multivitamin with minerals  1 tablet Oral Daily  . pantoprazole  40 mg Oral BID  .  phytonadione  5 mg Oral Daily  . rifaximin  550 mg Oral BID  . thiamine  100 mg Oral Daily   Or  . thiamine  100 mg Intravenous Daily   Continuous Infusions: . sodium chloride 10 mL/hr at 09/03/13 1845    Principal Problem:   Severe sepsis Active Problems:   Post-operative complication   Hematoma   Coagulopathy   Acute blood loss anemia   possible HCAP (healthcare-associated pneumonia)   Leukocytosis, unspecified   Thrombocytopenia   Hyponatremia   possible encephalopathy, hepatic    Time spent: 30 min    [facility] A  [facility] Pager  [phone]. If 7PM-7AM, please contact night-coverage at [email or web address], password [email or web address] 09/07/2013, 11:52 AM  LOS: 10 days

## 2013-09-07 NOTE — Progress Notes (Signed)
Pt resting and appears to be in no distress. Report received from prior RN, no changes noted to am assessment.  Will continue to monitor. Xavier Valentine A

## 2013-09-07 NOTE — Progress Notes (Signed)
Placed pt on 2L [place] due to desaturation of 85%

## 2013-09-07 NOTE — Progress Notes (Signed)
Clinical Social Work Department CLINICAL SOCIAL WORK PLACEMENT NOTE 09/07/2013  Patient:  Xavier Valentine, Xavier Valentine  Account Number:  [id number] Admit date:  08/28/2013  Clinical Social Worker:  Jacelyn Grip  Date/time:  09/07/2013 12:29 PM  Clinical Social Work is seeking post-discharge placement for this patient at the following level of care:   SKILLED NURSING   (*CSW will update this form in Epic as items are completed)   09/07/2013  Patient/family provided with [facility] Department of Clinical Social Work's list of facilities offering this level of care within the geographic area requested by the patient (or if unable, by the patient's family).  09/07/2013  Patient/family informed of their freedom to choose among providers that offer the needed level of care, that participate in Medicare, Medicaid or managed care program needed by the patient, have an available bed and are willing to accept the patient.  09/07/2013  Patient/family informed of [facility]' ownership interest in [facility], as well as of the fact that they are under no obligation to receive care at this facility.  PASARR submitted to EDS on 09/07/2013 PASARR number received on   FL2 transmitted to all facilities in geographic area requested by pt/family on  09/07/2013 FL2 transmitted to all facilities within larger geographic area on   Patient informed that his/her managed care company has contracts with or will negotiate with  certain facilities, including the following:     Patient/family informed of bed offers received:   Patient chooses bed at  Physician recommends and patient chooses bed at    Patient to be transferred to  on   Patient to be transferred to facility by  Patient and family notified of transfer on  Name of family member notified:    The following physician request were entered in Epic:   Additional Comments:   Loletta Specter, MSW, LCSW Clinical Social Work Weekend coverage  [phone]

## 2013-09-08 ENCOUNTER — Inpatient Hospital Stay (HOSPITAL_COMMUNITY): Payer: Medicaid - Out of State

## 2013-09-08 LAB — URINALYSIS, ROUTINE W REFLEX MICROSCOPIC
BILIRUBIN URINE: NEGATIVE
Glucose, UA: NEGATIVE mg/dL
Ketones, ur: NEGATIVE mg/dL
Nitrite: NEGATIVE
Protein, ur: NEGATIVE mg/dL
Specific Gravity, Urine: 1.015 (ref 1.005–1.030)
UROBILINOGEN UA: 0.2 mg/dL (ref 0.0–1.0)
pH: 5.5 (ref 5.0–8.0)

## 2013-09-08 LAB — CBC
HCT: 31.2 % — ABNORMAL LOW (ref 39.0–52.0)
HEMOGLOBIN: 10.6 g/dL — AB (ref 13.0–17.0)
MCH: 34.4 pg — ABNORMAL HIGH (ref 26.0–34.0)
MCHC: 34 g/dL (ref 30.0–36.0)
MCV: 101.3 fL — AB (ref 78.0–100.0)
PLATELETS: 112 10*3/uL — AB (ref 150–400)
RBC: 3.08 MIL/uL — ABNORMAL LOW (ref 4.22–5.81)
RDW: 17.8 % — ABNORMAL HIGH (ref 11.5–15.5)
WBC: 10.9 10*3/uL — ABNORMAL HIGH (ref 4.0–10.5)

## 2013-09-08 LAB — COMPREHENSIVE METABOLIC PANEL
ALT: 21 U/L (ref 0–53)
AST: 40 U/L — ABNORMAL HIGH (ref 0–37)
Albumin: 1.9 g/dL — ABNORMAL LOW (ref 3.5–5.2)
Alkaline Phosphatase: 67 U/L (ref 39–117)
Anion gap: 10 (ref 5–15)
BUN: 15 mg/dL (ref 6–23)
CO2: 28 meq/L (ref 19–32)
Calcium: 8.1 mg/dL — ABNORMAL LOW (ref 8.4–10.5)
Chloride: 97 mEq/L (ref 96–112)
Creatinine, Ser: 1.01 mg/dL (ref 0.50–1.35)
GFR calc Af Amer: >90 mL/min (ref >90)
GFR, EST NON AFRICAN AMERICAN: 82 mL/min — AB (ref >90)
Glucose, Bld: 86 mg/dL (ref 70–99)
POTASSIUM: 3.8 meq/L (ref 3.7–5.3)
SODIUM: 135 meq/L — AB (ref 137–147)
Total Bilirubin: 4 mg/dL — ABNORMAL HIGH (ref 0.3–1.2)
Total Protein: 6.8 g/dL (ref 6.0–8.3)

## 2013-09-08 LAB — URINE MICROSCOPIC-ADD ON

## 2013-09-08 LAB — PROTIME-INR
INR: 1.95 — ABNORMAL HIGH (ref 0.00–1.49)
Prothrombin Time: 22.2 seconds — ABNORMAL HIGH (ref 11.6–15.2)

## 2013-09-08 NOTE — Progress Notes (Signed)
[facility] PROGRESS NOTE  Xavier Valentine ZOX:[id number] DOB: April 26, 1960 DOA: 08/28/2013 PCP: No PCP Per Patient   HPI/Subjective: Patient is improving, concerned about is scrotal swelling. PT recommended SNF, start looking for bed.  Brief Summary  Xavier Valentine is a 53 y.o. male with past medical h/o of ETOH abuse and Hep C who underwent a right inguinal hernia repair in [place] 1 wk prior to admission.  After surgery, he moved from [place] to [place] because he was at risk of becoming homeless and needed to move in with his family. He was doing well post op until 2 days prior to admission when he states his wound began to bleed and would not stop.  In the ER, he was noted to have a significantly elevated bilirubin and an elevated INR.  After cryo, FFP, vitamin K, amicar, repeated pressure, his incision has finally started to stop bleeding.  His course has been complicated by sepsis possibly due to HCAP and he has almost completed a course of antibiotics.  He has also had hepatic encephalopathy and alcohol withdrawal.  He also became volume overloaded due to fluid resuscitation in setting of cirrhosis early in admission and is continuing to diurese.  His prognosis overall is fair to poor and his family is aware however he has poor insight into his condition.    Assessment/Plan  Right inguinal and abdominal Hematoma  -Post-operative complication due to coagulopathy secondary to cirrhosis. -Cryoprecipitate and FFP given 8/27 -Appreciate general surgery recommendations -Started amicar on 8/29 and continue daily vitamin K. -No bleeding, has massive scrotal swelling, CT reviewed, secondary to some fluids and likely hematoma as well.  Severe sepsis  -(fever, leukocytosis, low blood pressure) due to possible HCAP, although asymptomatic vs. Infected hematoma.   -Urine and blood cultures are negative. -Patient is on vancomycin and Zosyn since 8/28, antibiotics discontinued on 9/4 after  completed 7 days, watch off of antibiotics.  Acute hypoxic respiratory failure -Likely secondary to effusions/pulmonary edema from IVF and hypoalbuminemia.  Improving.   -Was on Lasix and held because of increasing creatinine, increase Lasix to 40 mg IV twice a day. -Continue prn duonebs for comfort (seem to be helping greatly per RN) - Continue dulera  Nausea, vomiting, and diarrhea, LFTs and lipase stable.  C. Diff negative.  Resolved.    Cirrhosis of liver  -Hep C and Alcohol induce with resultant thrombocytopenia, coagulopathy, ascites, elevated bili, low albumin -Restart Lasix and spironolactone. -Minimize hepatotoxins use. -Hyponatremia, hypoalbuminemia, from cytopenia and hyperbilirubinemia secondary to cirrhosis of the liver.  Ascites, not tense and non-tender  Possible hepatic encephalopathy -Slow speech/foggy thinking concerning for hepatic encephalopathy -Continue rifaximin, lactulose started, his mentation is much better.  ETOH abuse  -Patient went through significant withdrawal, improving now will decrease Ativan. -Continue Thiamine, folate, MVI  Cocaine positive, avoid B blockers.  Discussed health complications of Cocaine use with patient.   Leukocytosis, possibly due to pneumonia, infected hematoma, UA with rare WBC.  Resolved.  Acute blood loss anemia, incision site bleeding slowing.  Hgb approximately stable. -  Transfuse to keep hgb > 8 given ongoing bleeding  Calcium corrects with albumin  Lactic acidosis is common in setting of cirrhosis  Cachexia, severe protein calorie malnutrition -  Supplements -  Nutrition consultation   Diet:  Low salt, 1.2L fluid Access:  PIV IVF:  off Proph:  SCD  Code Status: full Family Communication: patient alone Disposition Plan:  Continue in stepdown for now.  Patient has poor insight into  condition.     Consultants:  Gen surg  Hematology  Procedures:  CT ab/p  Antibiotics:  vanc 8/27 >>  Zosyn 8/27  >>    Objective: Filed Vitals:   09/07/13 1301 09/07/13 1953 09/07/13 2058 09/08/13 0505  BP: 115/77 131/76  141/66  Pulse: 78 80  75  Temp: 98.4 F (36.9 C) 98 F (36.7 C)  97.6 F (36.4 C)  TempSrc: Oral Oral  Oral  Resp: Height:      Weight:      SpO2: 93% 97% 98% 90%    Intake/Output Summary (Last 24 hours) at 09/08/13 1206 Last data filed at 09/08/13 0900  Gross per 24 hour  Intake    920 ml  Output   2275 ml  Net  -1355 ml   Filed Weights   09/03/13 0400 09/04/13 0430 09/05/13 0400  Weight: 90.3 kg (199 lb 1.2 oz) 88.1 kg (194 lb 3.6 oz) 89.1 kg (196 lb 6.9 oz)    Exam:   General:  Cachectic WM, forced expirations, tachypnea, prolonged expiratory phase.    HEENT:  NCAT, MMM, scleral icterus   Cardiovascular:  RRR, nl S1, S2 no mrg, 2+ pulses, warm extremities  Respiratory:  Full expiratory wheeze with diminished BS at the bases, no focal rales or rhonchi.  Has a component of upper airway wheezing  Abdomen:   NABS, soft, mild to moderate distension, NT  GU:  Penis and scrotum swollen/ecchymotic, but not erythematous  MSK:   Normal tone and bulk, 1+ bilateral LEE  Neuro:  Grossly moves all extremities  Skin:  Right groin dressing with serosang drainage  Data Reviewed: Basic Metabolic Panel:  Recent Labs Lab 09/04/13 0333 09/05/13 0335 09/06/13 0448 09/07/13 0520 09/08/13 0436  NA 133* 131* 133* 134* 135*  K 3.7 3.5* 3.9 4.0 3.8  CL 95* 95* 96 97 97  CO2 GLUCOSE 94 96 90 94 86  BUN CREATININE 1.08 1.09 0.97 0.99 1.01  CALCIUM 7.5* 7.7* 7.7* 7.9* 8.1*   Liver Function Tests:  Recent Labs Lab 09/04/13 0333 09/05/13 0335 09/06/13 0448 09/07/13 0520 09/08/13 0436  AST 46* 38* 38* 38* 40*  ALT ALKPHOS 73 61 64 65 67  BILITOT 5.1* 4.5* 4.2* 3.5* 4.0*  PROT 6.1 5.9* 6.3 6.5 6.8  ALBUMIN 1.8* 1.6* 1.8* 1.8* 1.9*   No results found for this basename: LIPASE, AMYLASE,  in the  last 168 hours  Recent Labs Lab 09/02/13 0520  AMMONIA 27   CBC:  Recent Labs Lab 09/04/13 0333 09/05/13 0335 09/06/13 0448 09/07/13 0520 09/08/13 0436  WBC 8.5 5.5 6.1 6.9 10.9*  HGB 9.8* 9.2* 9.5* 9.6* 10.6*  HCT 28.7* 27.3* 28.0* 29.1* 31.2*  MCV 98.3 98.6 99.6 101.4* 101.3*  PLT 115* 98* 109* 101* 112*   Cardiac Enzymes: No results found for this basename: CKTOTAL, CKMB, CKMBINDEX, TROPONINI,  in the last 168 hours BNP (last 3 results)  Recent Labs  08/28/13 1357 09/02/13 0520  PROBNP 739.8* 348.5*   CBG: No results found for this basename: GLUCAP,  in the last 168 hours  Recent Results (from the past 240 hour(s))  MRSA PCR SCREENING     Status: Abnormal   Collection Time    08/29/13  6:11 PM      Result Value Ref Range Status   MRSA by PCR POSITIVE (*) NEGATIVE Final  Comment:            The GeneXpert MRSA Assay (FDA     approved for NASAL specimens     only), is one component of a     comprehensive MRSA colonization     surveillance program. It is not     intended to diagnose MRSA     infection nor to guide or     monitor treatment for     MRSA infections.     RESULT CALLED TO, READ BACK BY AND VERIFIED WITH:     [facility] SHERYL RN 2050 08/29/2013 BY BOVELL,T.  CLOSTRIDIUM DIFFICILE BY PCR     Status: None   Collection Time    08/30/13  4:18 AM      Result Value Ref Range Status   C difficile by pcr NEGATIVE  NEGATIVE Final   Comment: Performed at [facility]     Studies: Dg Chest 2 View  09/08/2013   CLINICAL DATA:  Lower abdominal pain.  Hypoxia.  EXAM: CHEST  2 VIEW  COMPARISON:  09/02/2013 and 08/30/2013.  FINDINGS: Trachea is midline. Heart size is grossly stable. Interval improvement in bilateral airspace disease with residual subsegmental atelectasis in the left midlung zone. Small bilateral effusions.  IMPRESSION: Resolved pulmonary edema with left perihilar atelectasis and residual small bilateral effusions.   Electronically Signed    By: Leanna Battles M.D.   On: 09/08/2013 10:58    Scheduled Meds: . antiseptic oral rinse  7 mL Mouth Rinse q12n4p  . Chlorhexidine Gluconate Cloth  6 each Topical q morning - 10a  . folic acid  1 mg Oral Daily  . furosemide  40 mg Intravenous Q12H  . lactulose  20 g Oral BID  . mometasone-formoterol  2 puff Inhalation BID  . multivitamin with minerals  1 tablet Oral Daily  . pantoprazole  40 mg Oral BID  . phytonadione  5 mg Oral Daily  . rifaximin  550 mg Oral BID  . spironolactone  100 mg Oral Daily  . thiamine  100 mg Oral Daily   Or  . thiamine  100 mg Intravenous Daily   Continuous Infusions: . sodium chloride 10 mL/hr at 09/03/13 1845    Principal Problem:   Severe sepsis Active Problems:   Post-operative complication   Hematoma   Coagulopathy   Acute blood loss anemia   possible HCAP (healthcare-associated pneumonia)   Leukocytosis, unspecified   Thrombocytopenia   Hyponatremia   possible encephalopathy, hepatic    Time spent: 30 min    [facility] A  [facility] Pager [phone]. If 7PM-7AM, please contact night-coverage at [email or web address], password [facility] 09/08/2013, 12:06 PM  LOS: 11 days

## 2013-09-08 NOTE — Progress Notes (Signed)
Physical Therapy Treatment Patient Details Name: BURNIE THERIEN MRN: [id number] DOB: 05/23/1960 Today's Date: 09/08/2013    History of Present Illness AYVION KAVANAGH is a 53 y.o. male with past medical h/o of ETOH abuse and Hep C who underwent a right inguinal hernia repair in [place] 1 wk ago. He was admitted due to wound had begun to bleed.     PT Comments    Pt cooperative but highly distractible and impulsive with limited safety awareness and insight into current abilities  Follow Up Recommendations  SNF     Equipment Recommendations  None recommended by PT    Recommendations for Other Services OT consult     Precautions / Restrictions Precautions Precautions: Fall Restrictions Weight Bearing Restrictions: No    Mobility  Bed Mobility Overal bed mobility: Needs Assistance Bed Mobility: Supine to Sit     Supine to sit: Min assist        Transfers Overall transfer level: Needs assistance Equipment used: None Transfers: Sit to/from Stand Sit to Stand: +2 physical assistance;Mod assist         General transfer comment: assist to rise and stabilize, pt unsteady.  Ambulation/Gait Ambulation/Gait assistance: Mod assist;+2 physical assistance;+2 safety/equipment Ambulation Distance (Feet): 180 Feet Assistive device: Rolling walker (2 wheeled) Gait Pattern/deviations: Step-to pattern;Step-through pattern;Decreased step length - right;Decreased step length - left;Shuffle;Trunk flexed;Staggering right;Staggering left     General Gait Details: Constant cues for saftey awareness.  Instability all directions sans AD - partially compensated with use of RW and constant cues for posture, position from RW and basic saftey awareness   Stairs            Wheelchair Mobility    Modified Rankin (Stroke Patients Only)       Balance   Sitting-balance support: No upper extremity supported Sitting balance-Leahy Scale: Fair     Standing balance support:  Bilateral upper extremity supported Standing balance-Leahy Scale: Poor                      Cognition Arousal/Alertness: Awake/alert Behavior During Therapy: Impulsive Overall Cognitive Status: No family/caregiver present to determine baseline cognitive functioning                      Exercises      General Comments        Pertinent Vitals/Pain Pain Assessment: 0-10 Pain Score: 8  Pain Location: scrotum Pain Descriptors / Indicators: Sharp Pain Intervention(s): Limited activity within patient's tolerance;Monitored during session;Other (comment) (nursing aware)    Home Living                      Prior Function            PT Goals (current goals can now be found in the care plan section) Acute Rehab PT Goals Patient Stated Goal: agreeable to get up wtih therapy PT Goal Formulation: With patient Time For Goal Achievement: 09/13/13 Potential to Achieve Goals: Fair Progress towards PT goals: Progressing toward goals    Frequency  Min 3X/week    PT Plan Current plan remains appropriate    Co-evaluation             End of Session   Activity Tolerance: Patient limited by fatigue;Patient limited by pain Patient left: in bed;with call bell/phone within reach;with bed alarm set     Time: 4098-1191 PT Time Calculation (min): 17 min  Charges:  $Gait Training: 8-22 mins  G Codes:      Hoyte Ziebell Sep 21, 2013, 10:53 AM

## 2013-09-09 LAB — PRO B NATRIURETIC PEPTIDE: Pro B Natriuretic peptide (BNP): 383.9 pg/mL — ABNORMAL HIGH (ref 0–125)

## 2013-09-09 LAB — CBC
HCT: 29.3 % — ABNORMAL LOW (ref 39.0–52.0)
Hemoglobin: 9.7 g/dL — ABNORMAL LOW (ref 13.0–17.0)
MCH: 33.7 pg (ref 26.0–34.0)
MCHC: 33.1 g/dL (ref 30.0–36.0)
MCV: 101.7 fL — ABNORMAL HIGH (ref 78.0–100.0)
Platelets: 106 10*3/uL — ABNORMAL LOW (ref 150–400)
RBC: 2.88 MIL/uL — ABNORMAL LOW (ref 4.22–5.81)
RDW: 17.8 % — AB (ref 11.5–15.5)
WBC: 12.2 10*3/uL — ABNORMAL HIGH (ref 4.0–10.5)

## 2013-09-09 LAB — PROTIME-INR
INR: 2.25 — AB (ref 0.00–1.49)
Prothrombin Time: 24.9 seconds — ABNORMAL HIGH (ref 11.6–15.2)

## 2013-09-09 LAB — COMPREHENSIVE METABOLIC PANEL
ALT: 17 U/L (ref 0–53)
ANION GAP: 10 (ref 5–15)
AST: 38 U/L — ABNORMAL HIGH (ref 0–37)
Albumin: 1.8 g/dL — ABNORMAL LOW (ref 3.5–5.2)
Alkaline Phosphatase: 62 U/L (ref 39–117)
BUN: 16 mg/dL (ref 6–23)
CO2: 27 meq/L (ref 19–32)
Calcium: 7.8 mg/dL — ABNORMAL LOW (ref 8.4–10.5)
Chloride: 97 mEq/L (ref 96–112)
Creatinine, Ser: 1.09 mg/dL (ref 0.50–1.35)
GFR calc Af Amer: 87 mL/min — ABNORMAL LOW (ref >90)
GFR, EST NON AFRICAN AMERICAN: 75 mL/min — AB (ref >90)
GLUCOSE: 104 mg/dL — AB (ref 70–99)
POTASSIUM: 3.5 meq/L — AB (ref 3.7–5.3)
Sodium: 134 mEq/L — ABNORMAL LOW (ref 137–147)
Total Bilirubin: 3.8 mg/dL — ABNORMAL HIGH (ref 0.3–1.2)
Total Protein: 6.3 g/dL (ref 6.0–8.3)

## 2013-09-09 MED ORDER — THIAMINE HCL 100 MG PO TABS: 100.0000 mg | ORAL_TABLET | Freq: Every day | ORAL | Status: AC

## 2013-09-09 MED ORDER — PANTOPRAZOLE SODIUM 40 MG PO TBEC: 40.0000 mg | DELAYED_RELEASE_TABLET | Freq: Every day | ORAL | Status: DC

## 2013-09-09 MED ORDER — FUROSEMIDE 40 MG PO TABS: 40.0000 mg | ORAL_TABLET | Freq: Every day | ORAL | Status: DC

## 2013-09-09 MED ORDER — SPIRONOLACTONE 100 MG PO TABS: 100.0000 mg | ORAL_TABLET | Freq: Every day | ORAL | Status: DC

## 2013-09-09 MED ORDER — LACTULOSE 10 GM/15ML PO SOLN: 20.0000 g | Freq: Every day | ORAL | Status: DC

## 2013-09-09 MED ORDER — ADULT MULTIVITAMIN W/MINERALS CH: 1.0000 | ORAL_TABLET | Freq: Every day | ORAL | Status: DC

## 2013-09-09 MED ORDER — RIFAXIMIN 550 MG PO TABS: 550.0000 mg | ORAL_TABLET | Freq: Two times a day (BID) | ORAL | Status: DC

## 2013-09-09 MED ORDER — LACTULOSE 10 GM/15ML PO SOLN
20.0000 g | Freq: Every day | ORAL | Status: DC
  Administered 2013-09-10: 20 g via ORAL
  Filled 2013-09-09: qty 30

## 2013-09-09 MED ORDER — FOLIC ACID 1 MG PO TABS: 1.0000 mg | ORAL_TABLET | Freq: Every day | ORAL | Status: DC

## 2013-09-09 NOTE — Discharge Summary (Addendum)
Physician Discharge Summary  Xavier Valentine BHA:[id number] DOB: Jul 23, 1960 DOA: 08/28/2013  PCP: No PCP Per Patient  Admit date: 08/28/2013 Discharge date: 09/09/2013  Time spent: 40 minutes  Recommendations for Outpatient Follow-up:  1. Followup with nursing home M.D. 2. Needs followup as outpatient with gastroenterology for newly diagnosed cirrhosis. 3. CBC, CMP and INR within one week, follow WBC and INR trend.  Discharge Diagnoses:  Principal Problem:   Severe sepsis Active Problems:   Post-operative complication   Hematoma   Coagulopathy   Acute blood loss anemia   possible HCAP (healthcare-associated pneumonia)   Leukocytosis, unspecified   Thrombocytopenia   Hyponatremia   possible encephalopathy, hepatic   Discharge Condition: Stable  Diet recommendation: Heart healthy  Filed Weights   09/03/13 0400 09/04/13 0430 09/05/13 0400  Weight: 90.3 kg (199 lb 1.2 oz) 88.1 kg (194 lb 3.6 oz) 89.1 kg (196 lb 6.9 oz)    History of present illness:  Xavier Valentine is a 53 y.o. male with past medical h/o of ETOH abuse and Hep C who underwent a right inguinal hernia repair in [place] 1 wk ago. He was doing well post op until 2 days ago when he states the wound began to bleed. It has continued to bleed for these 2 days and he therefore decided to come to the ER. He has no c/o dizziness lightheaded sensation with standing. He is noted to have a significantly elevated bilirubin and an elevated INR. I do not have records to see what his INR was pre-op but he states that his pre-op lab work was unremarkable.   Hospital Course:   LIDO MASKE is a 53 y.o. male with past medical h/o of ETOH abuse and Hep C who underwent a right inguinal hernia repair in [place] 1 wk prior to admission. After surgery, he moved from [place] to [place] because he was at risk of becoming homeless and needed to move in with his family. He was doing well post op until 2 days prior to admission when he  states his wound began to bleed and would not stop. In the ER, he was noted to have a significantly elevated bilirubin and an elevated INR. After cryo, FFP, vitamin K, amicar, repeated pressure, his incision has finally started to stop bleeding. His course has been complicated by sepsis possibly due to HCAP and he has almost completed a course of antibiotics. He has also had hepatic encephalopathy and alcohol withdrawal. He also became volume overloaded due to fluid resuscitation in setting of cirrhosis early in admission and is continuing to diurese. His prognosis overall is fair to poor and his family is aware however he has poor insight into his condition.   Hospital course according to patient active problems:  Right inguinal and abdominal Hematoma  -Post-operative complication due to coagulopathy secondary to cirrhosis.  -Cryoprecipitate and FFP given 8/27  -Appreciate general surgery recommendations  -Started amicar on 8/29 and continue daily vitamin K.  -No bleeding, has massive scrotal swelling, CT reviewed, secondary to some fluids and likely hematoma as well.  -CT of pelvis and proximal discharge, stable swelling, keep scrotal sac elevated.  Severe sepsis  -(fever, leukocytosis, low blood pressure) due to possible HCAP, although asymptomatic vs. Infected hematoma.  -Urine and blood cultures are negative.  -Patient is on vancomycin and Zosyn since 8/28, antibiotics discontinued on 9/4 after completed 7 days. -Antibiotics discontinued on 9/4, no evidence of fever or chills since then. -On discharge there is slow transit of  leukocytosis, no antibiotics prescribed, please follow closely.  Acute hypoxic respiratory failure  -Likely secondary to effusions/pulmonary edema from IVF and hypoalbuminemia. Improving.  -Was on Lasix and held because of increasing creatinine, increase Lasix to 40 mg IV twice a day.  -Continue prn duonebs for comfort (seem to be helping greatly per RN)  - Continue  dulera   Nausea, vomiting, and diarrhea, LFTs and lipase stable. C. Diff negative. Resolved.  Cirrhosis of liver  -Hep C and Alcohol induce with resultant thrombocytopenia, coagulopathy, ascites, elevated bili, low albumin  -Started on Lasix and spironolactone.  -Minimize hepatotoxins use.  -Hyponatremia, hypoalbuminemia, from cytopenia and hyperbilirubinemia secondary to cirrhosis of the liver.   Ascites, placed on Lasix 40 mg and spironolactone 100 mg on discharge.  Possible hepatic encephalopathy  -Slow speech/foggy thinking concerning for hepatic encephalopathy  -Continue rifaximi discharge on rifaximin, lactulose daily. Patient needs GI/hepatologist followup as outpatient.  ETOH abuse  -Patient went through significant withdrawal, improving now will decrease Ativan.  -Continue Thiamine, folate, MVI   Cocaine positive, avoid B blockers. Discussed health complications of Cocaine use with patient.   Leukocytosis, possibly due to pneumonia, infected hematoma, UA with rare WBC. Resolved.   Acute blood loss anemia, incision site bleeding slowing. Hgb approximately stable.  - Transfuse to keep hgb > 8 given ongoing bleeding   Lactic acidosis likely secondary to sepsis which is resolved.  Cachexia, severe protein calorie malnutrition  - Supplements  - Nutrition consultation   Patient to be discharged to his sister's home. Patient will follow up with general surgery in one week for staple removal  Procedures:  2-D echo showed ejection fraction of 50-55%, grade 1 diastolic dysfunction.  Consultations:  General surgery.  Neurology  Discharge Exam: Filed Vitals:   09/09/13 0639  BP: 119/65  Pulse: 68  Temp: 98.4 F (36.9 C)  Resp: 20   General: Alert and awake, oriented x3, but sometime will be confused HEENT: anicteric sclera, pupils reactive to light and accommodation, EOMI CVS: S1-S2 clear, no murmur rubs or gallops Chest: clear to auscultation bilaterally, no  wheezing, rales or rhonchi Abdomen: soft nontender, nondistended, normal bowel sounds, no organomegaly Extremities: no cyanosis, clubbing or edema noted bilaterally Neuro: Cranial nerves II-XII intact, no focal neurological deficits  Discharge Instructions You were cared for by a hospitalist during your hospital stay. If you have any questions about your discharge medications or the care you received while you were in the hospital after you are discharged, you can call the unit and asked to speak with the hospitalist on call if the hospitalist that took care of you is not available. Once you are discharged, your primary care physician will handle any further medical issues. Please note that NO REFILLS for any discharge medications will be authorized once you are discharged, as it is imperative that you return to your primary care physician (or establish a relationship with a primary care physician if you do not have one) for your aftercare needs so that they can reassess your need for medications and monitor your lab values.  Discharge Instructions   Diet - low sodium heart healthy    Complete by:  As directed      Increase activity slowly    Complete by:  As directed           Current Discharge Medication List    START taking these medications   Details  folic acid (FOLVITE) 1 MG tablet Take 1 tablet (1 mg total) by mouth  daily. Qty: 30 tablet, Refills: 0    furosemide (LASIX) 40 MG tablet Take 1 tablet (40 mg total) by mouth daily. Qty: 30 tablet, Refills: 0    lactulose (CHRONULAC) 10 GM/15ML solution Take 30 mLs (20 g total) by mouth daily. Qty: 240 mL, Refills: 0    Multiple Vitamin (MULTIVITAMIN WITH MINERALS) TABS tablet Take 1 tablet by mouth daily.    pantoprazole (PROTONIX) 40 MG tablet Take 1 tablet (40 mg total) by mouth daily. Qty: 30 tablet, Refills: 0    rifaximin (XIFAXAN) 550 MG TABS tablet Take 1 tablet (550 mg total) by mouth 2 (two) times daily. Qty: 60 tablet,  Refills: 0    spironolactone (ALDACTONE) 100 MG tablet Take 1 tablet (100 mg total) by mouth daily. Qty: 30 tablet, Refills: 0    thiamine 100 MG tablet Take 1 tablet (100 mg total) by mouth daily. Qty: 30 tablet, Refills: 0      STOP taking these medications     Docusate Calcium (STOOL SOFTENER PO)      oxycodone (OXY-IR) 5 MG capsule        Allergies  Allergen Reactions  . Bee Venom Swelling   Follow-up Information   Schedule an appointment as soon as possible for a visit with Dr. Jerelene Redden . (Follow up with his office for staple removal.)    Contact information:   In [place]       The results of significant diagnostics from this hospitalization (including imaging, microbiology, ancillary and laboratory) are listed below for reference.    Significant Diagnostic Studies: Dg Chest 2 View  09/08/2013   CLINICAL DATA:  Lower abdominal pain.  Hypoxia.  EXAM: CHEST  2 VIEW  COMPARISON:  09/02/2013 and 08/30/2013.  FINDINGS: Trachea is midline. Heart size is grossly stable. Interval improvement in bilateral airspace disease with residual subsegmental atelectasis in the left midlung zone. Small bilateral effusions.  IMPRESSION: Resolved pulmonary edema with left perihilar atelectasis and residual small bilateral effusions.   Electronically Signed   By: Leanna Battles M.D.   On: 09/08/2013 10:58   Ct Abdomen Pelvis W Contrast  08/28/2013   CLINICAL DATA:  Right inguinal repair 1 week prior. Drainage from incision.  EXAM: CT ABDOMEN AND PELVIS WITH CONTRAST  TECHNIQUE: Multidetector CT imaging of the abdomen and pelvis was performed using the standard protocol following bolus administration of intravenous contrast.  CONTRAST:  OMNIPAQUE IOHEXOL 300 MG/ML SOLN, 50mL OMNIPAQUE IOHEXOL 300 MG/ML SOLN  COMPARISON:  None.  FINDINGS: Moderate bilateral pleural effusions. Consolidative opacity within the right and left lower lobes.  The liver is small and nodular in contour compatible  with cirrhosis. There is a 13 mm low-attenuation lesion within the hepatic dome, suggestive of a cyst. Gallbladder is mildly distended. Portal vein is patent. Spleen is enlarged measuring 19 cm. Pancreas and bilateral adrenal glands are unremarkable. Kidneys enhance symmetrically with contrast. Normal caliber abdominal aorta.  Multiple prominent retroperitoneal lymph nodes measuring up to 1.1 cm (image 37; series 2). Urinary bladder is unremarkable. Prostate unremarkable. Sigmoid colonic diverticulosis. No evidence for acute diverticulitis wall thickening of the cecum and ascending colon, likely secondary to portal collapse of the. Re- cannulization of the paraumbilical vein.  Postoperative changes within the right inguinal region. There is a large amount of fluid within the right inguinal region extending into the scrotum through the inguinal canal. High attenuation within the right hemiscrotum may represent 2 blood products. Overlying soft tissue edema.  No aggressive or acute  appearing osseous lesions.  IMPRESSION: 1. Postoperative changes within the right inguinal region compatible with recent right inguinal hernia repair. There is a large amount of fluid extending through the inguinal canal into the right hemiscrotum which is distended with overlying soft tissue edema. High attenuation within the right hemiscrotum may represent acute blood products. Recommend correlation with ultrasound. 2. Multiple nonspecific prominent retroperitoneal lymph nodes. Recommend correlation with laboratory analysis. Additionally a followup CT in 3 months may prove helpful to ensure stability. 3. Morphologic changes to the liver compatible with cirrhosis and sequelae of portal venous hypertension including splenomegaly. 4. Moderate bilateral pleural effusions. Underlying opacities suggestive of atelectasis.   Electronically Signed   By: Annia Belt M.D.   On: 08/28/2013 21:57   Ct Pelvis Limited W/o Cm  09/08/2013   CLINICAL  DATA:  Scrotal swelling. Right inguinal hernia repair. Incisional bleeding after right inguinal hernia repair. Postoperative complication due to coagulopathy and cirrhosis.  EXAM: CT PELVIS WITHOUT CONTRAST  TECHNIQUE: Contiguous axial CT images were obtained from the iliac crests through the proximal femurs to include the scrotum and entire pelvic contents. Multiplanar reformatting was performed in the coronal and sagittal planes. This is NOT a limited exam, and in fact is protocoled to extend well further in the upper legs than a normal pelvic CT.  COMPARISON:  08/28/2013  FINDINGS: Prominent pelvic ascites. Diffuse subcutaneous edema compatible with third spacing of fluid. Mesenteric edema. Sigmoid diverticulosis. Urinary bladder unremarkable.  Right external iliac lymph node short axis diameter 1.3 cm. Clustered additional iliac and inguinal lymph nodes are present bilaterally.  Faintly visible postoperative findings from inguinal hernia repair. Very minimally hyperdense fluid tracks along the inguinal rain and surrounds the right spermatic cord, extending down into the scrotum. I can't really see the inferior epigastric vessels all that well to be able to characterize the hernia further. The right spermatic cord itself appears edematous and expanded compared to the left. This fluid collection deviates the base of the penis to the left. Scrotal skin swelling compatible with the generalized third spacing of fluid.  IMPRESSION: 1. Slightly complex fluid collection tracks from the right inguinal ring around the edematous right spermatic cord and into the scrotum. Given that this large hydrocele measures slightly above the density of the extensive ascites in the pelvis, it may well have some involving blood products. If there is concern for testicular torsion then scrotal Doppler would be recommended. 2. Extensive third spacing of fluid with prominent ascites, subcutaneous edema, mesenteric edema, and scrotal skin  swelling. 3. Mildly enlarged external iliac and inguinal lymph nodes could be reactive, neoplastic, or due to passive lymphatic congestion.   Electronically Signed   By: Herbie Baltimore M.D.   On: 09/08/2013 14:02   Dg Chest Port 1 View  09/02/2013   CLINICAL DATA:  Shortness of breath  EXAM: PORTABLE CHEST - 1 VIEW  COMPARISON:  08/30/2013  FINDINGS: Prominent cardiomediastinal contours. Central vascular congestion. Perihilar interstitial and hazy airspace opacities. Left pleural effusion has increased and predominantly collects along the periphery of the left hemithorax. Associated airspace consolidation. No pneumothorax. No interval osseous change.  IMPRESSION: Increased central vascular congestion and perihilar interstitial and airspace opacities, favor pulmonary edema.  Increase left pleural effusion and associated airspace consolidation; atelectasis versus pneumonia.   Electronically Signed   By: Jearld Lesch M.D.   On: 09/02/2013 00:49   Dg Chest Port 1 View  08/30/2013   CLINICAL DATA:  Shortness of breath  EXAM: PORTABLE CHEST -  1 VIEW  COMPARISON:  None.  FINDINGS: Cardiac shadow is at the upper limits of normal in size. Increased density is noted in the left lung base consistent with the previously seen atelectasis and pleural effusion. The previously seen right-sided pleural effusion is less well appreciated on this exam. No other focal abnormality is noted.  IMPRESSION: Left basilar atelectasis and pleural effusion.   Electronically Signed   By: Alcide Clever M.D.   On: 08/30/2013 08:17    Microbiology: No results found for this or any previous visit (from the past 240 hour(s)).   Labs: Basic Metabolic Panel:  Recent Labs Lab 09/05/13 0335 09/06/13 0448 09/07/13 0520 09/08/13 0436 09/09/13 0431  NA 131* 133* 134* 135* 134*  K 3.5* 3.9 4.0 3.8 3.5*  CL 95* 96 97 97 97  CO2 27 27 28 28 27   GLUCOSE 96 90 94 86 104*  BUN 14 12 14 15 16   CREATININE 1.09 0.97 0.99 1.01 1.09   CALCIUM 7.7* 7.7* 7.9* 8.1* 7.8*   Liver Function Tests:  Recent Labs Lab 09/05/13 0335 09/06/13 0448 09/07/13 0520 09/08/13 0436 09/09/13 0431  AST 38* 38* 38* 40* 38*  ALT 22 21 20 21 17   ALKPHOS 61 64 65 67 62  BILITOT 4.5* 4.2* 3.5* 4.0* 3.8*  PROT 5.9* 6.3 6.5 6.8 6.3  ALBUMIN 1.6* 1.8* 1.8* 1.9* 1.8*   No results found for this basename: LIPASE, AMYLASE,  in the last 168 hours No results found for this basename: AMMONIA,  in the last 168 hours CBC:  Recent Labs Lab 09/05/13 0335 09/06/13 0448 09/07/13 0520 09/08/13 0436 09/09/13 0431  WBC 5.5 6.1 6.9 10.9* 12.2*  HGB 9.2* 9.5* 9.6* 10.6* 9.7*  HCT 27.3* 28.0* 29.1* 31.2* 29.3*  MCV 98.6 99.6 101.4* 101.3* 101.7*  PLT 98* 109* 101* 112* 106*   Cardiac Enzymes: No results found for this basename: CKTOTAL, CKMB, CKMBINDEX, TROPONINI,  in the last 168 hours BNP: BNP (last 3 results)  Recent Labs  08/28/13 1357 09/02/13 0520 09/09/13 0430  PROBNP 739.8* 348.5* 383.9*   CBG: No results found for this basename: GLUCAP,  in the last 168 hours     Signed:  ELMAHI,MUTAZ A  [facility] 09/09/2013, 11:21 AM

## 2013-09-09 NOTE — Progress Notes (Signed)
Physical Therapy Treatment Patient Details Name: Xavier Valentine MRN: [id number] DOB: 01-Apr-1960 Today's Date: 09/09/2013    History of Present Illness Xavier Valentine is a 53 y.o. male with past medical h/o of ETOH abuse and Hep C who underwent a right inguinal hernia repair in [place] 1 wk ago. He was admitted due to wound had begun to bleed.     PT Comments    Progressing with mobility. +2 for safety. Continue to recommend SNF  Follow Up Recommendations  SNF     Equipment Recommendations  None recommended by PT    Recommendations for Other Services OT consult     Precautions / Restrictions Precautions Precautions: Fall Restrictions Weight Bearing Restrictions: No    Mobility  Bed Mobility Overal bed mobility: Needs Assistance       Supine to sit: Mod assist     General bed mobility comments: assist for trunk and LEs. Increased time.   Transfers Overall transfer level: Needs assistance Equipment used: None Transfers: Sit to/from Stand Sit to Stand: Min assist;+2 safety/equipment         General transfer comment: assist to rise, stabilize, control descent. VCS safety  Ambulation/Gait Ambulation/Gait assistance: Min assist;+2 safety/equipment Ambulation Distance (Feet): 175 Feet Assistive device: Rolling walker (2 wheeled) Gait Pattern/deviations: Step-through pattern;Trunk flexed;Decreased stride length     General Gait Details: assist to stabilize. VCs safety, distance from RW.    Stairs            Wheelchair Mobility    Modified Rankin (Stroke Patients Only)       Balance                                    Cognition Arousal/Alertness: Awake/alert Behavior During Therapy: Impulsive Overall Cognitive Status: No family/caregiver present to determine baseline cognitive functioning                      Exercises      General Comments        Pertinent Vitals/Pain Pain Score: 8  Pain Location:  scrotum Pain Intervention(s): Limited activity within patient's tolerance;Monitored during session    Home Living                      Prior Function            PT Goals (current goals can now be found in the care plan section) Progress towards PT goals: Progressing toward goals    Frequency  Min 3X/week    PT Plan Current plan remains appropriate    Co-evaluation PT/OT/SLP Co-Evaluation/Treatment: Yes Reason for Co-Treatment: For patient/therapist safety PT goals addressed during session: Mobility/safety with mobility;Balance;Proper use of DME       End of Session Equipment Utilized During Treatment: Gait belt Activity Tolerance: Patient tolerated treatment well Patient left: in bed;with call bell/phone within reach;with nursing/sitter in room     Time: 4098-1191 PT Time Calculation (min): 27 min  Charges:  $Gait Training: 8-22 mins                    G Codes:      Rebeca Alert, MPT Pager: [phone]

## 2013-09-09 NOTE — Progress Notes (Signed)
CSW still trying to find SNF that could offer a bed for patient, though there are barriers including Out of [place] Medicaid and previous substance abuse issues. Spoke with sister who is aware that she needs to complete [place] Medicaid application, sister also stated that if CSW is still unable to find a SNF for patient that as long as patient is able to ambulate to the bathroom on his own, that she would be able to take him home with her. Sister works from home so would be there to assist if needed. CSW awaiting calls back from multiple facilities and will follow-up in the morning though if none are able to offer a bed then patient will most likely d/c home with sister.   Clinical Social Work Department CLINICAL SOCIAL WORK PLACEMENT NOTE 09/09/2013  Patient:  Xavier, Valentine  Account Number:  [id number] Admit date:  08/28/2013  Clinical Social Worker:  Jacelyn Grip  Date/time:  09/07/2013 12:29 PM  Clinical Social Work is seeking post-discharge placement for this patient at the following level of care:   SKILLED NURSING   (*CSW will update this form in Epic as items are completed)   09/07/2013  Patient/family provided with [facility] Department of Clinical Social Work's list of facilities offering this level of care within the geographic area requested by the patient (or if unable, by the patient's family).  09/07/2013  Patient/family informed of their freedom to choose among providers that offer the needed level of care, that participate in Medicare, Medicaid or managed care program needed by the patient, have an available bed and are willing to accept the patient.  09/07/2013  Patient/family informed of [facility]' ownership interest in [facility], as well as of the fact that they are under no obligation to receive care at this facility.  PASARR submitted to EDS on 09/07/2013 PASARR number received on   FL2 transmitted to all facilities in geographic area requested by  pt/family on  09/07/2013 FL2 transmitted to all facilities within larger geographic area on 09/09/2013  Patient informed that his/her managed care company has contracts with or will negotiate with  certain facilities, including the following:   Out of [facility]     Patient/family informed of bed offers received:   Patient chooses bed at  Physician recommends and patient chooses bed at    Patient to be transferred to  on   Patient to be transferred to facility by  Patient and family notified of transfer on  Name of family member notified:    The following physician request were entered in Epic:   Additional Comments:   Lincoln Maxin, LCSW [facility] Clinical Social Worker cell #: [phone]

## 2013-09-09 NOTE — Progress Notes (Signed)
Occupational Therapy Treatment Patient Details Name: Xavier Valentine MRN: [id number] DOB: 05/15/1960 Today's Date: 09/09/2013    History of present illness Xavier Valentine is a 53 y.o. male with past medical h/o of ETOH abuse and Hep C who underwent a right inguinal hernia repair in [place] 1 wk ago. He was admitted due to wound had begun to bleed.    OT comments  Pt continues to be impulsive and demonstrated decreased safety awareness. He practiced donning socks at EOB and transferred into the bathroom for grooming tasks.    Follow Up Recommendations  SNF;Supervision/Assistance - 24 hour    Equipment Recommendations  3 in 1 bedside comode    Recommendations for Other Services      Precautions / Restrictions Precautions Precautions: Fall Restrictions Weight Bearing Restrictions: No       Mobility Bed Mobility Overal bed mobility: Needs Assistance       Supine to sit: Mod assist     General bed mobility comments: assist for trunk and LEs. Increased time.   Transfers Overall transfer level: Needs assistance Equipment used: None Transfers: Sit to/from Stand Sit to Stand: Min assist;+2 safety/equipment         General transfer comment: assist to rise, stabilize, control descent. VCS safety    Balance                                   ADL       Grooming: Wash/dry face;Moderate assistance;Standing               Lower Body Dressing: Sitting/lateral leans;Minimal assistance Lower Body Dressing Details (indicate cue type and reason): don socks at EOB Toilet Transfer: +2 for safety/equipment;Minimal assistance;Ambulation;RW             General ADL Comments: Pt easily distracted and needs mod cues to initiate and follow through with tasks. Pt trying to lie back down after sitting up a few moments to don gown. Needed encouragement to sit up again and proceed with tasks. He stands quickly without notice and didnt have gripper socks on. Pt  wanting to void so assisted pt to position urinal in standing and use. Then had pt sit back down to don socks for safety. Used walker to ambulate in hallway (see PT notes) and then back to room and into bathroom to groom. Pt washing face with washcloth but not wringing out washcloth so water got all over the floor and had to be cleaned up so pt could exit bathroom safely. Pt needed +2 for safety as he is easily distracted and impulsive and overall demonstrates decreased safety awareness.       Vision                     Perception     Praxis      Cognition   Behavior During Therapy: Impulsive Overall Cognitive Status: No family/caregiver present to determine baseline cognitive functioning                       Extremity/Trunk Assessment               Exercises     Shoulder Instructions       General Comments      Pertinent Vitals/ Pain       Pain Assessment: 0-10 Pain Score: 8  Pain Location: scrotum Pain Intervention(s): Limited activity  within patient's tolerance;Monitored during session  Home Living                                          Prior Functioning/Environment              Frequency Min 2X/week     Progress Toward Goals  OT Goals(current goals can now be found in the care plan section)  Progress towards OT goals: Progressing toward goals     Plan Discharge plan remains appropriate    Co-evaluation    PT/OT/SLP Co-Evaluation/Treatment: Yes Reason for Co-Treatment: For patient/therapist safety PT goals addressed during session: Mobility/safety with mobility;Balance;Proper use of DME OT goals addressed during session: ADL's and self-care;Proper use of Adaptive equipment and DME      End of Session Equipment Utilized During Treatment: Gait belt;Rolling walker   Activity Tolerance Patient tolerated treatment well   Patient Left in bed;with call bell/phone within reach;with nursing/sitter in room    Nurse Communication          Time: 1610-9604 OT Time Calculation (min): 1435 min  Charges: OT General Charges $OT Visit: 1 Procedure OT Treatments $Self Care/Home Management : 8-22 mins  Lennox Laity [phone] 09/09/2013, 12:59 PM

## 2013-09-09 NOTE — Progress Notes (Signed)
Patient ID: Xavier Valentine, male   DOB: [birth year], 53 y.o.   MRN: [id number]    Subjective: Patient is somewhat confused but conversant. He denies pain at his inguinal hernia repair site  Objective: Vital signs in last 24 hours: Temp:  [98.4 F (36.9 C)-99.2 F (37.3 C)] 98.4 F (36.9 C) (09/08 0639) Pulse Rate:  [68-81] 68 (09/08 0639) Resp:  [20] 20 (09/08 0639) BP: (110-138)/(64-86) 119/65 mmHg (09/08 0639) SpO2:  [90 %-95 %] 90 % (09/08 0853) Last BM Date: 09/08/13  Intake/Output from previous day: 09/07 0701 - 09/08 0700 In: 480 [P.O.:480] Out: 880 [Urine:880] Intake/Output this shift: Total I/O In: 320 [P.O.:320] Out: -   General appearance: alert, cooperative and mildly confused Incision/Wound: incision right groin cleaned with faint erythema around the staples. Fairly large scrotal hematoma which seems stable. Incision and abdominal wall negative except for some ecchymosis of the abdominal wall.  Lab Results:   Recent Labs  09/08/13 0436 09/09/13 0431  WBC 10.9* 12.2*  HGB 10.6* 9.7*  HCT 31.2* 29.3*  PLT 112* 106*   BMET  Recent Labs  09/08/13 0436 09/09/13 0431  NA 135* 134*  K 3.8 3.5*  CL 97 97  CO2 28 27  GLUCOSE 86 104*  BUN 15 16  CREATININE 1.01 1.09  CALCIUM 8.1* 7.8*     Studies/Results: Dg Chest 2 View  09/08/2013   CLINICAL DATA:  Lower abdominal pain.  Hypoxia.  EXAM: CHEST  2 VIEW  COMPARISON:  09/02/2013 and 08/30/2013.  FINDINGS: Trachea is midline. Heart size is grossly stable. Interval improvement in bilateral airspace disease with residual subsegmental atelectasis in the left midlung zone. Small bilateral effusions.  IMPRESSION: Resolved pulmonary edema with left perihilar atelectasis and residual small bilateral effusions.   Electronically Signed   By: Leanna Battles M.D.   On: 09/08/2013 10:58   Ct Pelvis Limited W/o Cm  09/08/2013   CLINICAL DATA:  Scrotal swelling. Right inguinal hernia repair. Incisional bleeding after  right inguinal hernia repair. Postoperative complication due to coagulopathy and cirrhosis.  EXAM: CT PELVIS WITHOUT CONTRAST  TECHNIQUE: Contiguous axial CT images were obtained from the iliac crests through the proximal femurs to include the scrotum and entire pelvic contents. Multiplanar reformatting was performed in the coronal and sagittal planes. This is NOT a limited exam, and in fact is protocoled to extend well further in the upper legs than a normal pelvic CT.  COMPARISON:  08/28/2013  FINDINGS: Prominent pelvic ascites. Diffuse subcutaneous edema compatible with third spacing of fluid. Mesenteric edema. Sigmoid diverticulosis. Urinary bladder unremarkable.  Right external iliac lymph node short axis diameter 1.3 cm. Clustered additional iliac and inguinal lymph nodes are present bilaterally.  Faintly visible postoperative findings from inguinal hernia repair. Very minimally hyperdense fluid tracks along the inguinal rain and surrounds the right spermatic cord, extending down into the scrotum. I can't really see the inferior epigastric vessels all that well to be able to characterize the hernia further. The right spermatic cord itself appears edematous and expanded compared to the left. This fluid collection deviates the base of the penis to the left. Scrotal skin swelling compatible with the generalized third spacing of fluid.  IMPRESSION: 1. Slightly complex fluid collection tracks from the right inguinal ring around the edematous right spermatic cord and into the scrotum. Given that this large hydrocele measures slightly above the density of the extensive ascites in the pelvis, it may well have some involving blood products. If there is concern for  testicular torsion then scrotal Doppler would be recommended. 2. Extensive third spacing of fluid with prominent ascites, subcutaneous edema, mesenteric edema, and scrotal skin swelling. 3. Mildly enlarged external iliac and inguinal lymph nodes could be  reactive, neoplastic, or due to passive lymphatic congestion.   Electronically Signed   By: Herbie Baltimore M.D.   On: 09/08/2013 14:02    Anti-infectives: Anti-infectives   Start     Dose/Rate Route Frequency Ordered Stop   09/05/13 0600  vancomycin (VANCOCIN) IVPB 1000 mg/200 mL premix  Status:  Discontinued     1,000 mg 200 mL/hr over 60 Minutes Intravenous Every 12 hours 09/04/13 1311 09/05/13 1102   09/04/13 1800  vancomycin (VANCOCIN) IVPB 1000 mg/200 mL premix  Status:  Discontinued     1,000 mg 200 mL/hr over 60 Minutes Intravenous Every 12 hours 09/04/13 1304 09/04/13 1311   08/30/13 2200  rifaximin (XIFAXAN) tablet 550 mg     550 mg Oral 2 times daily 08/30/13 1906     08/29/13 0400  piperacillin-tazobactam (ZOSYN) IVPB 3.375 g  Status:  Discontinued     3.375 g 12.5 mL/hr over 240 Minutes Intravenous Every 8 hours 08/29/13 0347 09/05/13 1102   08/29/13 0400  vancomycin (VANCOCIN) IVPB 1000 mg/200 mL premix  Status:  Discontinued     1,000 mg 200 mL/hr over 60 Minutes Intravenous Every 8 hours 08/29/13 0347 09/04/13 1304   08/28/13 1400  piperacillin-tazobactam (ZOSYN) IVPB 4.5 g  Status:  Discontinued     4.5 g 200 mL/hr over 30 Minutes Intravenous  Once 08/28/13 1352 08/28/13 1356   08/28/13 1400  vancomycin (VANCOCIN) IVPB 1000 mg/200 mL premix     1,000 mg 200 mL/hr over 60 Minutes Intravenous  Once 08/28/13 1352 08/28/13 1548   08/28/13 1400  piperacillin-tazobactam (ZOSYN) IVPB 3.375 g     3.375 g 100 mL/hr over 30 Minutes Intravenous  Once 08/28/13 1357 08/28/13 1547      Assessment/Plan: Right groin wound appears stable with no further bleeding or infection. Hematoma should gradually resolve. SNF placement anticipated. Would leave staples in the incision for another week or so.    LOS: 12 days    Aniella Wandrey T 09/09/2013

## 2013-09-10 LAB — COMPREHENSIVE METABOLIC PANEL
ALT: 18 U/L (ref 0–53)
AST: 36 U/L (ref 0–37)
Albumin: 1.8 g/dL — ABNORMAL LOW (ref 3.5–5.2)
Alkaline Phosphatase: 64 U/L (ref 39–117)
Anion gap: 11 (ref 5–15)
BUN: 17 mg/dL (ref 6–23)
CALCIUM: 7.6 mg/dL — AB (ref 8.4–10.5)
CHLORIDE: 93 meq/L — AB (ref 96–112)
CO2: 24 mEq/L (ref 19–32)
CREATININE: 1.06 mg/dL (ref 0.50–1.35)
GFR calc non Af Amer: 77 mL/min — ABNORMAL LOW (ref >90)
GFR, EST AFRICAN AMERICAN: 89 mL/min — AB (ref >90)
GLUCOSE: 93 mg/dL (ref 70–99)
Potassium: 4 mEq/L (ref 3.7–5.3)
Sodium: 128 mEq/L — ABNORMAL LOW (ref 137–147)
Total Bilirubin: 4.5 mg/dL — ABNORMAL HIGH (ref 0.3–1.2)
Total Protein: 6.6 g/dL (ref 6.0–8.3)

## 2013-09-10 LAB — CBC
HCT: 29.4 % — ABNORMAL LOW (ref 39.0–52.0)
Hemoglobin: 9.9 g/dL — ABNORMAL LOW (ref 13.0–17.0)
MCH: 33.8 pg (ref 26.0–34.0)
MCHC: 33.7 g/dL (ref 30.0–36.0)
MCV: 100.3 fL — AB (ref 78.0–100.0)
PLATELETS: 106 10*3/uL — AB (ref 150–400)
RBC: 2.93 MIL/uL — AB (ref 4.22–5.81)
RDW: 17.2 % — ABNORMAL HIGH (ref 11.5–15.5)
WBC: 14.5 10*3/uL — AB (ref 4.0–10.5)

## 2013-09-10 LAB — PROTIME-INR
INR: 2.2 — ABNORMAL HIGH (ref 0.00–1.49)
Prothrombin Time: 24.4 seconds — ABNORMAL HIGH (ref 11.6–15.2)

## 2013-09-10 NOTE — Progress Notes (Signed)
Patient and niece verbalized understanding of discharge instructions. Patient was given prescriptions and d/c forms. Patient is stable at discharge. Patient is accompanied by nursing staff to lobby. Patient's niece is waiting for patient in lobby.

## 2013-09-12 ENCOUNTER — Encounter (HOSPITAL_COMMUNITY): Payer: Self-pay | Admitting: Emergency Medicine

## 2013-09-12 ENCOUNTER — Emergency Department (HOSPITAL_COMMUNITY)
Admission: EM | Admit: 2013-09-12 | Discharge: 2013-09-12 | Disposition: A | Discharge status: Home / Self Care | Payer: Medicaid - Out of State | Attending: Emergency Medicine | Admitting: Emergency Medicine

## 2013-09-12 DIAGNOSIS — Z79899 Other long term (current) drug therapy: Secondary | ICD-10-CM | POA: Insufficient documentation

## 2013-09-12 DIAGNOSIS — Z8619 Personal history of other infectious and parasitic diseases: Secondary | ICD-10-CM | POA: Insufficient documentation

## 2013-09-12 DIAGNOSIS — L02219 Cutaneous abscess of trunk, unspecified: Secondary | ICD-10-CM | POA: Insufficient documentation

## 2013-09-12 DIAGNOSIS — F17200 Nicotine dependence, unspecified, uncomplicated: Secondary | ICD-10-CM | POA: Insufficient documentation

## 2013-09-12 DIAGNOSIS — Y838 Other surgical procedures as the cause of abnormal reaction of the patient, or of later complication, without mention of misadventure at the time of the procedure: Secondary | ICD-10-CM | POA: Insufficient documentation

## 2013-09-12 DIAGNOSIS — K7030 Alcoholic cirrhosis of liver without ascites: Secondary | ICD-10-CM | POA: Insufficient documentation

## 2013-09-12 DIAGNOSIS — N509 Disorder of male genital organs, unspecified: Secondary | ICD-10-CM | POA: Diagnosis not present

## 2013-09-12 DIAGNOSIS — T814XXA Infection following a procedure, initial encounter: Secondary | ICD-10-CM

## 2013-09-12 DIAGNOSIS — L03319 Cellulitis of trunk, unspecified: Secondary | ICD-10-CM

## 2013-09-12 DIAGNOSIS — T8140XA Infection following a procedure, unspecified, initial encounter: Secondary | ICD-10-CM | POA: Diagnosis not present

## 2013-09-12 DIAGNOSIS — N50819 Testicular pain, unspecified: Secondary | ICD-10-CM

## 2013-09-12 DIAGNOSIS — IMO0001 Reserved for inherently not codable concepts without codable children: Secondary | ICD-10-CM

## 2013-09-12 LAB — CBC WITH DIFFERENTIAL/PLATELET
BASOS ABS: 0.1 10*3/uL (ref 0.0–0.1)
BASOS PCT: 1 % (ref 0–1)
EOS ABS: 0.1 10*3/uL (ref 0.0–0.7)
EOS PCT: 1 % (ref 0–5)
HCT: 37.9 % — ABNORMAL LOW (ref 39.0–52.0)
Hemoglobin: 12.4 g/dL — ABNORMAL LOW (ref 13.0–17.0)
Lymphocytes Relative: 16 % (ref 12–46)
Lymphs Abs: 1.6 10*3/uL (ref 0.7–4.0)
MCH: 34 pg (ref 26.0–34.0)
MCHC: 32.7 g/dL (ref 30.0–36.0)
MCV: 103.8 fL — AB (ref 78.0–100.0)
Monocytes Absolute: 0.7 10*3/uL (ref 0.1–1.0)
Monocytes Relative: 7 % (ref 3–12)
NEUTROS PCT: 75 % (ref 43–77)
Neutro Abs: 7.8 10*3/uL — ABNORMAL HIGH (ref 1.7–7.7)
PLATELETS: 150 10*3/uL (ref 150–400)
RBC: 3.65 MIL/uL — AB (ref 4.22–5.81)
RDW: 16.6 % — AB (ref 11.5–15.5)
WBC: 10.3 10*3/uL (ref 4.0–10.5)

## 2013-09-12 LAB — URINALYSIS, ROUTINE W REFLEX MICROSCOPIC
Bilirubin Urine: NEGATIVE
GLUCOSE, UA: NEGATIVE mg/dL
HGB URINE DIPSTICK: NEGATIVE
Ketones, ur: NEGATIVE mg/dL
Nitrite: NEGATIVE
PROTEIN: NEGATIVE mg/dL
SPECIFIC GRAVITY, URINE: 1.014 (ref 1.005–1.030)
Urobilinogen, UA: 0.2 mg/dL (ref 0.0–1.0)
pH: 7.5 (ref 5.0–8.0)

## 2013-09-12 LAB — COMPREHENSIVE METABOLIC PANEL
ALBUMIN: 2.1 g/dL — AB (ref 3.5–5.2)
ALK PHOS: 80 U/L (ref 39–117)
ALT: 22 U/L (ref 0–53)
AST: 45 U/L — AB (ref 0–37)
Anion gap: 13 (ref 5–15)
BUN: 16 mg/dL (ref 6–23)
CALCIUM: 8.2 mg/dL — AB (ref 8.4–10.5)
CO2: 23 mEq/L (ref 19–32)
Chloride: 97 mEq/L (ref 96–112)
Creatinine, Ser: 1.07 mg/dL (ref 0.50–1.35)
GFR calc Af Amer: 88 mL/min — ABNORMAL LOW (ref >90)
GFR calc non Af Amer: 76 mL/min — ABNORMAL LOW (ref >90)
Glucose, Bld: 159 mg/dL — ABNORMAL HIGH (ref 70–99)
POTASSIUM: 4.1 meq/L (ref 3.7–5.3)
SODIUM: 133 meq/L — AB (ref 137–147)
TOTAL PROTEIN: 8.2 g/dL (ref 6.0–8.3)
Total Bilirubin: 3.3 mg/dL — ABNORMAL HIGH (ref 0.3–1.2)

## 2013-09-12 LAB — LIPASE, BLOOD: Lipase: 83 U/L — ABNORMAL HIGH (ref 11–59)

## 2013-09-12 LAB — URINE MICROSCOPIC-ADD ON

## 2013-09-12 LAB — PROTIME-INR
INR: 2.04 — ABNORMAL HIGH (ref 0.00–1.49)
Prothrombin Time: 23 seconds — ABNORMAL HIGH (ref 11.6–15.2)

## 2013-09-12 MED ORDER — OXYCODONE HCL 5 MG PO TABS: 5.0000 mg | ORAL_TABLET | ORAL | Status: DC | PRN

## 2013-09-12 MED ORDER — CEPHALEXIN 500 MG PO CAPS: 500.0000 mg | ORAL_CAPSULE | Freq: Four times a day (QID) | ORAL | Status: DC

## 2013-09-12 NOTE — Discharge Instructions (Signed)

## 2013-09-12 NOTE — ED Notes (Signed)
The pt has hernia surgery 3 weeks ago he had a groin hernia.  He has had more pain in the site.  He just moved here. 3 weeks ago. And he has had swollen extremities and a swollen scrotum . No voiding difficulty.

## 2013-09-12 NOTE — ED Provider Notes (Signed)
CSN: [id number]     Arrival date & time 09/12/13  1735 History   First MD Initiated Contact with Patient 09/12/13 2123     Chief Complaint  Patient presents with  . hernia repair     The history is provided by the patient and medical records.   Pt with recent inguinal hernia repair and  New diagnosis of ETOH cirrhosis with coagulopathy who presents for complication of hernia repair.  About 3 weeks ago he had hernia repair in [place] but the 2 days later drove to [place] to move in with family due to homelessness.  After travel he noted bruising to abdomen and pelvis with increasing edema to incision site and right scrotum.  Found to have hematoma and hydrocele 2 weeks ago due to hepatic coagulopathy requiring admission. Became septic and was treated with abx which therapy has been completed. He presents now for pain control. He says the swelling has not changed much but may be slightly improved.  He was not discharged with pain medications.  He has no arranged outpatient follow up with surgery, he does not know if he will be staying in the area.  He denies fevers, chills, n/v, difficulty urinating, hematuria, dysuria.He says the bruising has improved but there is now redness near incision site.  No purulence.    Past Medical History  Diagnosis Date  . Alcoholism   . Inguinal hernia   . Hepatitis C   . Perirectal abscess    Past Surgical History  Procedure Laterality Date  . Hernia repair     No family history on file. History  Substance Use Topics  . Smoking status: Current Some Day Smoker  . Smokeless tobacco: Never Used  . Alcohol Use: Yes    Review of Systems  Constitutional: Negative for fever and chills.  Respiratory: Negative for cough, shortness of breath and wheezing.   Cardiovascular: Negative for chest pain.  Gastrointestinal: Negative for nausea, vomiting and abdominal pain.  Genitourinary: Positive for scrotal swelling and testicular pain. Negative for dysuria, hematuria,  flank pain and difficulty urinating.  Musculoskeletal: Negative for back pain.  Skin: Positive for color change and wound.  Hematological: Bruises/bleeds easily.  All other systems reviewed and are negative.     Allergies  Bee venom  Home Medications   Prior to Admission medications   Medication Sig Start Date End Date Taking? Authorizing Provider  folic acid (FOLVITE) 1 MG tablet Take 1 tablet (1 mg total) by mouth daily. 09/09/13   Clydia Llano, MD  furosemide (LASIX) 40 MG tablet Take 1 tablet (40 mg total) by mouth daily. 09/09/13   Clydia Llano, MD  lactulose (CHRONULAC) 10 GM/15ML solution Take 30 mLs (20 g total) by mouth daily. 09/10/13   Clydia Llano, MD  Multiple Vitamin (MULTIVITAMIN WITH MINERALS) TABS tablet Take 1 tablet by mouth daily. 09/09/13   Clydia Llano, MD  pantoprazole (PROTONIX) 40 MG tablet Take 1 tablet (40 mg total) by mouth daily. 09/09/13   Clydia Llano, MD  rifaximin (XIFAXAN) 550 MG TABS tablet Take 1 tablet (550 mg total) by mouth 2 (two) times daily. 09/09/13   Clydia Llano, MD  spironolactone (ALDACTONE) 100 MG tablet Take 1 tablet (100 mg total) by mouth daily. 09/09/13   Clydia Llano, MD  thiamine 100 MG tablet Take 1 tablet (100 mg total) by mouth daily. 09/09/13   Clydia Llano, MD   BP 123/74  Pulse 71  Temp(Src) 98.3 F (36.8 C)  Resp 20  Ht   (1.803 m)  Wt 184 lb (83.462 kg)  BMI 25.67 kg/m2  SpO2 100% Physical Exam  Nursing note and vitals reviewed. Constitutional: He is oriented to person, place, and time. No distress.  Comfortable appearing  HENT:  Head: Normocephalic and atraumatic.  Nose: Nose normal.  Eyes: Conjunctivae are normal. No scleral icterus.  Neck: Normal range of motion. Neck supple. No tracheal deviation present.  Cardiovascular: Normal rate, regular rhythm and normal heart sounds.   No murmur heard. Pulmonary/Chest: Effort normal and breath sounds normal. No respiratory distress. He has no rales.  Abdominal: Soft. Bowel  sounds are normal. He exhibits no distension and no mass. There is no tenderness.  Genitourinary:  Right hemiscrotum with large amount of edema, size of an average apple. Very firm, not fluctuant or soft. Minimal amount of penile edema.  Musculoskeletal: Normal range of motion. He exhibits no edema.  Neurological: He is alert and oriented to person, place, and time.  Skin: Skin is warm and dry.  Small amount of ecchymoses just below umbilicus (quarter sized).  Erythema around incision site with spreading inferiorly to scrotum with warmth.  No purulence, Staples intact.   Psychiatric: He has a normal mood and affect.    ED Course  Procedures (including critical care time) Labs Review Labs Reviewed  CBC WITH DIFFERENTIAL - Abnormal; Notable for the following:    RBC 3.65 (*)    Hemoglobin 12.4 (*)    HCT 37.9 (*)    MCV 103.8 (*)    RDW 16.6 (*)    Neutro Abs 7.8 (*)    All other components within normal limits  COMPREHENSIVE METABOLIC PANEL - Abnormal; Notable for the following:    Sodium 133 (*)    Glucose, Bld 159 (*)    Calcium 8.2 (*)    Albumin 2.1 (*)    AST 45 (*)    Total Bilirubin 3.3 (*)    GFR calc non Af Amer 76 (*)    GFR calc Af Amer 88 (*)    All other components within normal limits  LIPASE, BLOOD - Abnormal; Notable for the following:    Lipase 83 (*)    All other components within normal limits  URINALYSIS, ROUTINE W REFLEX MICROSCOPIC - Abnormal; Notable for the following:    Color, Urine AMBER (*)    Leukocytes, UA TRACE (*)    All other components within normal limits  URINE MICROSCOPIC-ADD ON - Abnormal; Notable for the following:    Casts HYALINE CASTS (*)    All other components within normal limits    Imaging Review No results found.   EKG Interpretation None      MDM   Final diagnoses:  Wound cellulitis after surgery, initial encounter  Testicle pain  Alcoholic cirrhosis of liver without ascites   Pt presents for pain control and  redness around incision site.  Subsequent encounter after hematoma and hydrocele to right hemiscrotum after inguinal hernia repair with new dx of ETOH cirrhosis with coagulopathy.  Concern for cellulitis with spreading erythema at incision.  Compared to prior photo from recent admission, edema is somewhat improved, not worse.  Consistent with sequelae of hematoma. No voiding difficulty. Abd is soft, NTND. Ecchymoses is significantly improved, almost resolved compared to prior photos. Labs unchanged compared to recent admission. Do not feel imaigng is indicated given exam. Will treat with keflex and roxicodone.  Stressed importance of surgical follow up within the week.  Provided contact information for outpatient surgical  follow up.  Return for worsening of condition.      Sofie Rower, MD 09/13/13 [phone]

## 2013-09-12 NOTE — ED Notes (Signed)
Dr. Wickline at bedside.  

## 2013-09-12 NOTE — ED Notes (Signed)
Pt reports hernia repair 3wks ago and has since been having some swelling and increased pain. Pt reports he was admitted at [facility] and discharged Monday but states he didn't get any pain medication. Pt states he went back to [place] for a prescription but was not able to get one so he came here. Pt also requesting antibiotics. Pt rates pain 9/10.

## 2013-09-12 NOTE — ED Notes (Signed)
Discharge instructions reviewed with pt. Pt verbalized understanding.   

## 2013-09-15 NOTE — ED Provider Notes (Signed)
I have personally seen and examined the patient.  I have discussed the plan of care with the resident.  I have reviewed the documentation on PMH/FH/Soc. History.  I have reviewed the documentation of the resident and agree.  Pt with complicated history and recent hospitalization here for pain control When reviewing photo on previous admit, he appears to be improving He is not septic appearing.  He is comfortable appearing He may have new cellulitis but no crepitus or drainage or abscess formation He will outpatient followup Stable for d/c home  Joya Gaskins, MD 09/15/13 [phone]

## 2013-09-16 ENCOUNTER — Encounter (HOSPITAL_COMMUNITY): Payer: Self-pay | Admitting: Emergency Medicine

## 2013-09-16 ENCOUNTER — Emergency Department (HOSPITAL_COMMUNITY)
Admission: EM | Admit: 2013-09-16 | Discharge: 2013-09-16 | Disposition: A | Discharge status: Home / Self Care | Payer: Medicaid - Out of State | Attending: Emergency Medicine | Admitting: Emergency Medicine

## 2013-09-16 DIAGNOSIS — Z79899 Other long term (current) drug therapy: Secondary | ICD-10-CM | POA: Diagnosis not present

## 2013-09-16 DIAGNOSIS — Z8619 Personal history of other infectious and parasitic diseases: Secondary | ICD-10-CM | POA: Diagnosis not present

## 2013-09-16 DIAGNOSIS — Z4802 Encounter for removal of sutures: Secondary | ICD-10-CM | POA: Diagnosis not present

## 2013-09-16 DIAGNOSIS — L03319 Cellulitis of trunk, unspecified: Secondary | ICD-10-CM

## 2013-09-16 DIAGNOSIS — F17200 Nicotine dependence, unspecified, uncomplicated: Secondary | ICD-10-CM | POA: Diagnosis not present

## 2013-09-16 DIAGNOSIS — F1021 Alcohol dependence, in remission: Secondary | ICD-10-CM | POA: Insufficient documentation

## 2013-09-16 DIAGNOSIS — L03314 Cellulitis of groin: Secondary | ICD-10-CM

## 2013-09-16 DIAGNOSIS — Z9889 Other specified postprocedural states: Secondary | ICD-10-CM | POA: Diagnosis not present

## 2013-09-16 DIAGNOSIS — Z8719 Personal history of other diseases of the digestive system: Secondary | ICD-10-CM | POA: Insufficient documentation

## 2013-09-16 DIAGNOSIS — L02219 Cutaneous abscess of trunk, unspecified: Secondary | ICD-10-CM | POA: Insufficient documentation

## 2013-09-16 DIAGNOSIS — G8918 Other acute postprocedural pain: Secondary | ICD-10-CM | POA: Diagnosis not present

## 2013-09-16 DIAGNOSIS — R1031 Right lower quadrant pain: Secondary | ICD-10-CM | POA: Diagnosis not present

## 2013-09-16 DIAGNOSIS — R1030 Lower abdominal pain, unspecified: Secondary | ICD-10-CM

## 2013-09-16 DIAGNOSIS — K469 Unspecified abdominal hernia without obstruction or gangrene: Secondary | ICD-10-CM | POA: Diagnosis present

## 2013-09-16 MED ORDER — CEPHALEXIN 500 MG PO CAPS: 500.0000 mg | ORAL_CAPSULE | Freq: Four times a day (QID) | ORAL | Status: DC

## 2013-09-16 MED ORDER — OXYCODONE HCL 5 MG PO TABS: 5.0000 mg | ORAL_TABLET | ORAL | Status: DC | PRN

## 2013-09-16 NOTE — ED Notes (Signed)
Pt presents to department for evaluation of R groin hernia pain. States recent surgery, requesting to have staples removed. Pt also states he ran out of KEFLEX at home, 8/10 pain upon arrival to ED.

## 2013-09-16 NOTE — ED Notes (Addendum)
Pt O2 sat at 87% while sleeping, placed on 2L [place] and pt O2 sat now at 96%.

## 2013-09-16 NOTE — ED Provider Notes (Signed)
CSN: [id number]     Arrival date & time 09/16/13  1007 History   First MD Initiated Contact with Patient 09/16/13 1152     Chief Complaint  Patient presents with  . Hernia     (Consider location/radiation/quality/duration/timing/severity/associated sxs/prior Treatment) HPI Pt is a 53yo male presenting to ED for further evaluation of surgical site s/p right inguinal hernia repair.  Pt was seen on 09/12/13 for further evaluation and treatment of RLQ pain over incision and into scrotum, pt was discharged home at that time with a 5 day supply of keflex and pain medication. Pt states pain, swelling and redness having improved, however, states he thinks there is still some swelling and infection still present. States pain is constant, aching, sharp at times, 8/10 at worst.  Denies fever, chills, n/v/d.  Pt also states he was wondering if his staples can be removed today.  Surgical note from 09/09/13 states pt may have staples removed within 1 week/ pt reports significant improvement.   Past Medical History  Diagnosis Date  . Alcoholism   . Inguinal hernia   . Hepatitis C   . Perirectal abscess    Past Surgical History  Procedure Laterality Date  . Hernia repair     History reviewed. No pertinent family history. History  Substance Use Topics  . Smoking status: Current Some Day Smoker    Types: Cigarettes  . Smokeless tobacco: Never Used  . Alcohol Use: Yes    Review of Systems  Constitutional: Negative for fever and chills.  Respiratory: Negative for cough and shortness of breath.   Cardiovascular: Negative for chest pain and palpitations.  Gastrointestinal: Negative for nausea, vomiting, abdominal pain and diarrhea.  Genitourinary: Negative for dysuria, urgency, hematuria and flank pain.  All other systems reviewed and are negative.     Allergies  Bee venom  Home Medications   Prior to Admission medications   Medication Sig Start Date End Date Taking? Authorizing Provider   folic acid (FOLVITE) 1 MG tablet Take 1 tablet (1 mg total) by mouth daily. 09/09/13  Yes Clydia Llano, MD  furosemide (LASIX) 40 MG tablet Take 40 mg by mouth daily. 09/09/13  Yes Clydia Llano, MD  lactulose (CHRONULAC) 10 GM/15ML solution Take 30 g by mouth daily. Take every day per patient   Yes Historical Provider, MD  pantoprazole (PROTONIX) 40 MG tablet Take 1 tablet (40 mg total) by mouth daily. 09/09/13  Yes Clydia Llano, MD  spironolactone (ALDACTONE) 100 MG tablet Take 100 mg by mouth daily. 09/09/13  Yes Clydia Llano, MD  thiamine 100 MG tablet Take 1 tablet (100 mg total) by mouth daily. 09/09/13  Yes Clydia Llano, MD  cephALEXin (KEFLEX) 500 MG capsule Take 1 capsule (500 mg total) by mouth 4 (four) times daily. 09/16/13   Junius Finner, PA-C  Multiple Vitamin (MULTIVITAMIN WITH MINERALS) TABS tablet Take 1 tablet by mouth daily. 09/09/13   Clydia Llano, MD  oxyCODONE (ROXICODONE) 5 MG immediate release tablet Take 1 tablet (5 mg total) by mouth every 4 (four) hours as needed for severe pain. 09/16/13   Junius Finner, PA-C   BP 118/73  Pulse 67  Temp(Src) 98.3 F (36.8 C) (Oral)  Resp 16  SpO2 98% Physical Exam  Nursing note and vitals reviewed. Constitutional: He appears well-developed and well-nourished.  Pt lying comfortably in exam bed, appears well, non-toxic. NAD  HENT:  Head: Normocephalic and atraumatic.  Eyes: Conjunctivae are normal. No scleral icterus.  Neck: Normal range of motion.  Cardiovascular: Normal rate, regular rhythm and normal heart sounds.   Pulmonary/Chest: Effort normal and breath sounds normal. No respiratory distress. He has no wheezes. He has no rales. He exhibits no tenderness.  Abdominal: Soft. Bowel sounds are normal. He exhibits no distension and no mass. There is tenderness. There is no rebound and no guarding.  Soft, non-distended, no ecchymosis. Right groin: surgical incision healing well, staples in place. Minimal surrounding erythema. No discharge.  Mild tenderness to palpation.   Genitourinary:  Scrotum: erythematous, moderate edema, size of a small apple, firm, no fluctuance. Tender to palpation. Skin in tact-no discharge.   Musculoskeletal: Normal range of motion.  Neurological: He is alert.  Skin: Skin is warm and dry. There is erythema ( incision site and scrotum).    ED Course  Procedures (including critical care time)  SUTURE REMOVAL Performed by: Junius Finner A.  Consent: Verbal consent obtained. Consent given by: patient Required items: required blood products, implants, devices, and special equipment available Time out: Immediately prior to procedure a "time out" was called to verify the correct patient, procedure, equipment, support staff and site/side marked as required.  Location: right lower abdomen  Wound Appearance: clean, dry, erythematous   Sutures/Staples Removed: 35  Patient tolerance: Patient tolerated the procedure well with no immediate complications.   Labs Review Labs Reviewed - No data to display  Imaging Review No results found.   EKG Interpretation None      MDM   Final diagnoses:  Post-op pain  Cellulitis of groin  Lower abdominal pain  Encounter for staple removal   Medical records reviewed. Pt may have staples removed today per surgery note on 09/09/13.  Incision site appears to be healing well, staples removed w/o immediate complication. Pt states symptoms have improved significantly since 09/12/13, however states there has been a mild increase in edema.  Will tx pt with 5 more days of keflex.  Discussed pt with Dr. Rubin Payor who also examined pt and reviewed medical records, scrotum appears to be improving based off of pt hx and medical records, no indication for additional imaging at this time. Pt may be discharged home with pain medications. Advised to f/u with PCP or [facility] in 3-4 days for symptom recheck if not improving. Return precautions provided. Pt  verbalized understanding and agreement with tx plan.   Junius Finner, PA-C 09/16/13 1547

## 2013-09-17 NOTE — ED Provider Notes (Signed)
Medical screening examination/treatment/procedure(s) were conducted as a shared visit with non-physician practitioner(s) and myself.  I personally evaluated the patient during the encounter.   EKG Interpretation None     Patient with pain. Recent infection and surgery. Staples removed appears to be healing.   Juliet Rude. Rubin Payor, MD 09/17/13 [phone]

## 2013-09-21 ENCOUNTER — Emergency Department (HOSPITAL_COMMUNITY): Payer: Medicaid - Out of State

## 2013-09-21 ENCOUNTER — Observation Stay (HOSPITAL_COMMUNITY)
Admission: EM | Admit: 2013-09-21 | Discharge: 2013-09-23 | Disposition: A | Discharge status: Home / Self Care | Payer: Medicaid - Out of State | Attending: Internal Medicine | Admitting: Internal Medicine

## 2013-09-21 ENCOUNTER — Observation Stay (HOSPITAL_COMMUNITY): Payer: Medicaid - Out of State

## 2013-09-21 ENCOUNTER — Encounter (HOSPITAL_COMMUNITY): Payer: Self-pay | Admitting: Emergency Medicine

## 2013-09-21 DIAGNOSIS — Z79899 Other long term (current) drug therapy: Secondary | ICD-10-CM | POA: Diagnosis not present

## 2013-09-21 DIAGNOSIS — D62 Acute posthemorrhagic anemia: Secondary | ICD-10-CM

## 2013-09-21 DIAGNOSIS — K7031 Alcoholic cirrhosis of liver with ascites: Secondary | ICD-10-CM

## 2013-09-21 DIAGNOSIS — R339 Retention of urine, unspecified: Secondary | ICD-10-CM | POA: Insufficient documentation

## 2013-09-21 DIAGNOSIS — D72829 Elevated white blood cell count, unspecified: Secondary | ICD-10-CM | POA: Diagnosis not present

## 2013-09-21 DIAGNOSIS — R180 Malignant ascites: Secondary | ICD-10-CM

## 2013-09-21 DIAGNOSIS — Z91148 Patient's other noncompliance with medication regimen for other reason: Secondary | ICD-10-CM

## 2013-09-21 DIAGNOSIS — Z9119 Patient's noncompliance with other medical treatment and regimen: Secondary | ICD-10-CM | POA: Insufficient documentation

## 2013-09-21 DIAGNOSIS — B1920 Unspecified viral hepatitis C without hepatic coma: Secondary | ICD-10-CM | POA: Diagnosis not present

## 2013-09-21 DIAGNOSIS — F1020 Alcohol dependence, uncomplicated: Secondary | ICD-10-CM | POA: Diagnosis not present

## 2013-09-21 DIAGNOSIS — L02219 Cutaneous abscess of trunk, unspecified: Secondary | ICD-10-CM | POA: Diagnosis not present

## 2013-09-21 DIAGNOSIS — T148XXA Other injury of unspecified body region, initial encounter: Secondary | ICD-10-CM

## 2013-09-21 DIAGNOSIS — R188 Other ascites: Secondary | ICD-10-CM | POA: Diagnosis not present

## 2013-09-21 DIAGNOSIS — L03319 Cellulitis of trunk, unspecified: Secondary | ICD-10-CM

## 2013-09-21 DIAGNOSIS — R6520 Severe sepsis without septic shock: Secondary | ICD-10-CM

## 2013-09-21 DIAGNOSIS — Z91199 Patient's noncompliance with other medical treatment and regimen due to unspecified reason: Secondary | ICD-10-CM | POA: Insufficient documentation

## 2013-09-21 DIAGNOSIS — R0602 Shortness of breath: Secondary | ICD-10-CM

## 2013-09-21 DIAGNOSIS — D684 Acquired coagulation factor deficiency: Secondary | ICD-10-CM | POA: Diagnosis not present

## 2013-09-21 DIAGNOSIS — D689 Coagulation defect, unspecified: Secondary | ICD-10-CM

## 2013-09-21 DIAGNOSIS — J90 Pleural effusion, not elsewhere classified: Secondary | ICD-10-CM

## 2013-09-21 DIAGNOSIS — K7290 Hepatic failure, unspecified without coma: Secondary | ICD-10-CM

## 2013-09-21 DIAGNOSIS — A419 Sepsis, unspecified organism: Secondary | ICD-10-CM

## 2013-09-21 DIAGNOSIS — K769 Liver disease, unspecified: Secondary | ICD-10-CM | POA: Diagnosis not present

## 2013-09-21 DIAGNOSIS — J189 Pneumonia, unspecified organism: Secondary | ICD-10-CM

## 2013-09-21 DIAGNOSIS — D696 Thrombocytopenia, unspecified: Secondary | ICD-10-CM | POA: Diagnosis not present

## 2013-09-21 DIAGNOSIS — Z9114 Patient's other noncompliance with medication regimen: Secondary | ICD-10-CM

## 2013-09-21 DIAGNOSIS — D649 Anemia, unspecified: Secondary | ICD-10-CM

## 2013-09-21 DIAGNOSIS — K7682 Hepatic encephalopathy: Secondary | ICD-10-CM

## 2013-09-21 DIAGNOSIS — K7030 Alcoholic cirrhosis of liver without ascites: Secondary | ICD-10-CM | POA: Insufficient documentation

## 2013-09-21 DIAGNOSIS — E871 Hypo-osmolality and hyponatremia: Secondary | ICD-10-CM

## 2013-09-21 HISTORY — DX: Other ascites: R18.8

## 2013-09-21 HISTORY — DX: Coagulation defect, unspecified: D68.9

## 2013-09-21 HISTORY — DX: Thrombocytopenia, unspecified: D69.6

## 2013-09-21 HISTORY — DX: Unspecified cirrhosis of liver: K74.60

## 2013-09-21 LAB — CBC WITH DIFFERENTIAL/PLATELET
Basophils Absolute: 0 10*3/uL (ref 0.0–0.1)
Basophils Relative: 0 % (ref 0–1)
Eosinophils Absolute: 0.3 10*3/uL (ref 0.0–0.7)
Eosinophils Relative: 4 % (ref 0–5)
HCT: 33.2 % — ABNORMAL LOW (ref 39.0–52.0)
Hemoglobin: 11.1 g/dL — ABNORMAL LOW (ref 13.0–17.0)
LYMPHS ABS: 2.1 10*3/uL (ref 0.7–4.0)
Lymphocytes Relative: 28 % (ref 12–46)
MCH: 32.4 pg (ref 26.0–34.0)
MCHC: 33.4 g/dL (ref 30.0–36.0)
MCV: 96.8 fL (ref 78.0–100.0)
Monocytes Absolute: 0.9 10*3/uL (ref 0.1–1.0)
Monocytes Relative: 11 % (ref 3–12)
NEUTROS ABS: 4.3 10*3/uL (ref 1.7–7.7)
NEUTROS PCT: 57 % (ref 43–77)
Platelets: 126 10*3/uL — ABNORMAL LOW (ref 150–400)
RBC: 3.43 MIL/uL — ABNORMAL LOW (ref 4.22–5.81)
RDW: 15.1 % (ref 11.5–15.5)
WBC: 7.6 10*3/uL (ref 4.0–10.5)

## 2013-09-21 LAB — BODY FLUID CELL COUNT WITH DIFFERENTIAL
Lymphs, Fluid: 69 %
Monocyte-Macrophage-Serous Fluid: 22 % — ABNORMAL LOW (ref 50–90)
Neutrophil Count, Fluid: 9 % (ref 0–25)
WBC FLUID: 405 uL (ref 0–1000)

## 2013-09-21 LAB — COMPREHENSIVE METABOLIC PANEL
ALK PHOS: 76 U/L (ref 39–117)
ALT: 26 U/L (ref 0–53)
AST: 48 U/L — ABNORMAL HIGH (ref 0–37)
Albumin: 2 g/dL — ABNORMAL LOW (ref 3.5–5.2)
Anion gap: 10 (ref 5–15)
BUN: 12 mg/dL (ref 6–23)
CHLORIDE: 103 meq/L (ref 96–112)
CO2: 24 meq/L (ref 19–32)
Calcium: 7.8 mg/dL — ABNORMAL LOW (ref 8.4–10.5)
Creatinine, Ser: 0.84 mg/dL (ref 0.50–1.35)
GFR calc Af Amer: >90 mL/min (ref >90)
GLUCOSE: 98 mg/dL (ref 70–99)
POTASSIUM: 3.2 meq/L — AB (ref 3.7–5.3)
SODIUM: 137 meq/L (ref 137–147)
Total Bilirubin: 2.4 mg/dL — ABNORMAL HIGH (ref 0.3–1.2)
Total Protein: 7.3 g/dL (ref 6.0–8.3)

## 2013-09-21 LAB — ALBUMIN, FLUID (OTHER): Albumin, Fluid: 0.3 g/dL

## 2013-09-21 LAB — TROPONIN I: Troponin I: <0.3 ng/mL (ref <0.30)

## 2013-09-21 LAB — GRAM STAIN

## 2013-09-21 LAB — LIPASE, BLOOD: Lipase: 55 U/L (ref 11–59)

## 2013-09-21 MED ORDER — ALBUTEROL SULFATE (2.5 MG/3ML) 0.083% IN NEBU
3.0000 mL | INHALATION_SOLUTION | Freq: Four times a day (QID) | RESPIRATORY_TRACT | Status: DC | PRN
  Administered 2013-09-21 – 2013-09-23 (×3): 3 mL via RESPIRATORY_TRACT
  Filled 2013-09-21 (×4): qty 3

## 2013-09-21 MED ORDER — SPIRONOLACTONE 100 MG PO TABS
100.0000 mg | ORAL_TABLET | Freq: Every day | ORAL | Status: DC
  Administered 2013-09-21 – 2013-09-23 (×3): 100 mg via ORAL
  Filled 2013-09-21 (×3): qty 1

## 2013-09-21 MED ORDER — LIDOCAINE-EPINEPHRINE 1 %-1:100000 IJ SOLN
INTRAMUSCULAR | Status: AC
  Filled 2013-09-21: qty 1

## 2013-09-21 MED ORDER — FUROSEMIDE 40 MG PO TABS
40.0000 mg | ORAL_TABLET | Freq: Every day | ORAL | Status: DC
  Administered 2013-09-21 – 2013-09-23 (×3): 40 mg via ORAL
  Filled 2013-09-21 (×3): qty 1

## 2013-09-21 MED ORDER — OXYCODONE HCL 5 MG PO TABS
5.0000 mg | ORAL_TABLET | ORAL | Status: DC | PRN
  Administered 2013-09-21 – 2013-09-23 (×7): 5 mg via ORAL
  Filled 2013-09-21 (×7): qty 1

## 2013-09-21 MED ORDER — ONDANSETRON HCL 4 MG/2ML IJ SOLN: 4.0000 mg | Freq: Three times a day (TID) | INTRAMUSCULAR | Status: AC | PRN

## 2013-09-21 MED ORDER — CEPHALEXIN 500 MG PO CAPS
500.0000 mg | ORAL_CAPSULE | Freq: Four times a day (QID) | ORAL | Status: DC
  Administered 2013-09-21 – 2013-09-23 (×8): 500 mg via ORAL
  Filled 2013-09-21 (×11): qty 1

## 2013-09-21 MED ORDER — LACTULOSE 10 GM/15ML PO SOLN
10.0000 g | Freq: Three times a day (TID) | ORAL | Status: DC
Start: 2013-09-21 — End: 2013-09-23
  Administered 2013-09-21 – 2013-09-22 (×5): 10 g via ORAL
  Filled 2013-09-21 (×8): qty 15

## 2013-09-21 MED ORDER — NYSTATIN 100000 UNIT/GM EX OINT
TOPICAL_OINTMENT | Freq: Two times a day (BID) | CUTANEOUS | Status: DC
  Administered 2013-09-21: 17:00:00 via TOPICAL
  Filled 2013-09-21: qty 15

## 2013-09-21 MED ORDER — BACITRACIN-NEOMYCIN-POLYMYXIN OINTMENT TUBE
TOPICAL_OINTMENT | CUTANEOUS | Status: DC | PRN
  Administered 2013-09-22: 10:00:00 via TOPICAL
  Filled 2013-09-21: qty 15

## 2013-09-21 MED ORDER — ALBUMIN HUMAN 25 % IV SOLN
25.0000 g | Freq: Once | INTRAVENOUS | Status: AC
  Administered 2013-09-21: 25 g via INTRAVENOUS
  Filled 2013-09-21: qty 100

## 2013-09-21 NOTE — ED Notes (Signed)
Patient transported to Ultrasound 

## 2013-09-21 NOTE — Progress Notes (Signed)
CODA MATHEY [id number] Admitted to 4U98: 09/21/2013 6:12 PM Attending Provider: Rhetta Mura, MD    Xavier Valentine is a 53 y.o. male patient admitted from ED awake, alert  & orientated  X 3,  Full Code, VSS - Blood pressure 150/82, pulse 78, temperature 98.2 F (36.8 C), temperature source Oral, resp. rate 18, height  (1.803 m), weight 84.641 kg (186 lb 9.6 oz), SpO2 93.00%., R/A, no c/o shortness of breath, no c/o chest pain, no distress noted.  Non Tele.   IV site WDL:  antecubital right, condition patent and no redness with a transparent dsg that's clean dry and intact.  Allergies:   Allergies  Allergen Reactions  . Bee Venom Swelling     Past Medical History  Diagnosis Date  . Alcoholism   . Inguinal hernia   . Hepatitis C   . Perirectal abscess     History:  obtained from the patient.  Pt orientation to unit, room and routine. Information packet given to patient/family and safety video watched.  Admission INP armband ID verified with patient/family, and in place. SR up x 2, fall risk assessment complete with Patient and family verbalizing understanding of risks associated with falls. Pt verbalizes an understanding of how to use the call bell and to call for help before getting out of bed.  Skin, clean-dry- intact with redness & swelling to right groin & scrotum.  Pt. Had hernia repair about 1 month ago. No evidence of skin break down noted on exam.    Will cont to monitor and assist as needed.  Joana Reamer, RN 09/21/2013 6:12 PM

## 2013-09-21 NOTE — ED Notes (Signed)
Pt reports having recent hernia repair with poor healing and was in ICU for a week. Now having abd pain and distention, feels like he is constipated, last bm was two days ago. Having nausea and feels like abd is so distended it is causing sob. Airway intact at triage and ekg done.

## 2013-09-21 NOTE — ED Notes (Signed)
Dr. Suzzanne Cloud at bedside

## 2013-09-21 NOTE — ED Provider Notes (Signed)
CSN: [id number]     Arrival date & time 09/21/13  0745 History   First MD Initiated Contact with Patient 09/21/13 [phone]     Chief Complaint  Patient presents with  . Abdominal Pain  . Shortness of Breath     (Consider location/radiation/quality/duration/timing/severity/associated sxs/prior Treatment) HPI Comments: Patient presents with worsening shortness of breath, and abdominal distention over several days.  Recently discharged after ICU admission for HCAP & sepsis also cirrhosis and hepatic encephalopathy.  He reports compliance with his medication since returning home, but could not tell me which medications they were or if he had been taking Lasix and spironolactone.  He denies any fevers, chills, or chest pain.  Denies any alcohol use since discharge.   Patient is a 53 y.o. male presenting with abdominal pain and shortness of breath.  Abdominal Pain Pain location:  Generalized Pain quality: bloating and fullness   Pain radiates to:  Does not radiate Pain severity:  Mild Onset quality:  Gradual Timing:  Constant Progression:  Worsening Chronicity:  Chronic Context: alcohol use and previous surgery   Context: not recent illness, not retching, not sick contacts and not trauma   Relieved by:  Nothing Worsened by:  Nothing tried Ineffective treatments:  None tried Associated symptoms: constipation, hematochezia, melena, nausea and shortness of breath   Associated symptoms: no chest pain, no chills, no cough, no diarrhea, no dysuria, no fever, no hematemesis, no hematuria and no vomiting   Risk factors: alcohol abuse and recent hospitalization   Shortness of Breath Severity:  Moderate Onset quality:  Gradual Timing:  Constant Progression:  Worsening Relieved by:  None tried Worsened by:  Nothing tried Ineffective treatments:  Diuretics Associated symptoms: abdominal pain   Associated symptoms: no chest pain, no cough, no fever, no vomiting and no wheezing     Past Medical  History  Diagnosis Date  . Alcoholism   . Inguinal hernia   . Hepatitis C   . Perirectal abscess    Past Surgical History  Procedure Laterality Date  . Hernia repair     History reviewed. No pertinent family history. History  Substance Use Topics  . Smoking status: Current Some Day Smoker    Types: Cigarettes  . Smokeless tobacco: Never Used  . Alcohol Use: Yes    Review of Systems  Constitutional: Negative for fever and chills.  Respiratory: Positive for shortness of breath. Negative for cough, chest tightness and wheezing.   Cardiovascular: Negative for chest pain, palpitations and leg swelling.  Gastrointestinal: Positive for nausea, abdominal pain, constipation, blood in stool, melena and hematochezia. Negative for vomiting, diarrhea and hematemesis.  Genitourinary: Negative for dysuria, hematuria and flank pain.  Musculoskeletal: Negative for arthralgias, back pain and neck stiffness.      Allergies  Bee venom  Home Medications   Prior to Admission medications   Medication Sig Start Date End Date Taking? Authorizing Provider  albuterol (PROVENTIL HFA;VENTOLIN HFA) 108 (90 BASE) MCG/ACT inhaler Inhale 2 puffs into the lungs every 6 (six) hours as needed for wheezing or shortness of breath.   Yes Historical Provider, MD  folic acid (FOLVITE) 1 MG tablet Take 1 tablet (1 mg total) by mouth daily. 09/09/13  Yes Clydia Llano, MD  furosemide (LASIX) 40 MG tablet Take 40 mg by mouth daily. 09/09/13  Yes Clydia Llano, MD  Multiple Vitamin (MULTIVITAMIN WITH MINERALS) TABS tablet Take 1 tablet by mouth daily. 09/09/13  Yes Clydia Llano, MD  oxyCODONE (ROXICODONE) 5 MG immediate release tablet  Take 1 tablet (5 mg total) by mouth every 4 (four) hours as needed for severe pain. 09/16/13  Yes Junius Finner, PA-C  PRESCRIPTION MEDICATION Take 1-2 tablets by mouth daily as needed (for constipation). "Laxative"   Yes Historical Provider, MD  thiamine 100 MG tablet Take 1 tablet (100 mg  total) by mouth daily. 09/09/13  Yes Clydia Llano, MD  cephALEXin (KEFLEX) 500 MG capsule Take 1 capsule (500 mg total) by mouth 4 (four) times daily. 09/16/13   Junius Finner, PA-C   BP 145/83  Pulse 75  Temp(Src) 97.1 F (36.2 C) (Oral)  Resp 19  Ht  (1.803 m)  Wt 185 lb (83.915 kg)  BMI 25.81 kg/m2  SpO2 97% Physical Exam  Constitutional: He is oriented to person, place, and time. No distress.  HENT:  Mouth/Throat: Oropharynx is clear and moist.  Eyes: Pupils are equal, round, and reactive to light. Scleral icterus is present.  Cardiovascular: Normal rate, regular rhythm and normal heart sounds.   Pulmonary/Chest: No respiratory distress. He has no wheezes. He exhibits no tenderness.  Mildly tachypneic; decreased breath sounds bilaterally, with absent breath sounds right lower lobe  Abdominal: He exhibits distension. There is no tenderness. There is no rebound and no guarding.  Large ascites on bedside US.  Hepatojugular reflex.  Lower abdominal incision clean w/o erythema or drainage  Genitourinary:  Scrotal swelling  Neurological: He is alert and oriented to person, place, and time.  No asterixis  Skin: Skin is warm. No rash noted. He is not diaphoretic.    ED Course  Procedures (including critical care time) Labs Review Labs Reviewed  CBC WITH DIFFERENTIAL - Abnormal; Notable for the following:    RBC 3.43 (*)    Hemoglobin 11.1 (*)    HCT 33.2 (*)    Platelets 126 (*)    All other components within normal limits  COMPREHENSIVE METABOLIC PANEL - Abnormal; Notable for the following:    Potassium 3.2 (*)    Calcium 7.8 (*)    Albumin 2.0 (*)    AST 48 (*)    Total Bilirubin 2.4 (*)    All other components within normal limits  TROPONIN I  LIPASE, BLOOD  URINE RAPID DRUG SCREEN (HOSP PERFORMED)    Imaging Review Dg Abd Acute W/chest  09/21/2013   CLINICAL DATA:  History of hernia surgery 2 weeks ago at [facility]. Persistent infections since. Abdominal  pain and distention. History of cirrhosis.  EXAM: ACUTE ABDOMEN SERIES (ABDOMEN 2 VIEW & CHEST 1 VIEW)  COMPARISON:  CT abdomen pelvis - 09/08/2013 ; CT abdomen pelvis - 08/28/2013 chest radiograph - 09/08/2013; 09/02/2013  FINDINGS: Grossly unchanged borderline enlarged cardiac silhouette and mediastinal contours. Interval increase and now likely moderate size right-sided effusion. Unchanged small left-sided effusion. Mild cephalization of flow without frank evidence of edema.  Paucity of bowel gas without definite evidence of obstruction. There is nonspecific mild gaseous distention of a rather featureless loop of small bowel within the mid abdomen measuring approximately 3.7 cm in diameter, nonspecific in the setting of patient's known ascites. There is a minimal amount of gas seen within the descending colon. No pneumoperitoneum, pneumatosis or portal venous gas.  No acute osseus abnormalities.  IMPRESSION: 1. Interval increase in suspected moderate sized right-sided effusion. 2. Unchanged small left-sided effusion. 3. Pulmonary venous congestion without frank evidence of edema. 4. Paucity of bowel gas without definite evidence of obstruction.   Electronically Signed   By: Holland Commons.D.  On: 09/21/2013 09:02     EKG Interpretation   Date/Time:  Sunday September 21 2013 07:50:42 EDT Ventricular Rate:  89 PR Interval:  130 QRS Duration: 90 QT Interval:  396 QTC Calculation: 481 R Axis:   29 Text Interpretation:  Unusual P axis and short PR, probable junctional  tachycardia Cannot rule out Anterior infarct , age undetermined Abnormal  ECG No significant change since last tracing Confirmed by BEATON  MD,  ROBERT ([id number]) on 09/21/2013 9:10:38 AM      MDM   Final diagnoses:  Ascites of liver  Pleural effusion  SOB (shortness of breath)  Anemia, unspecified anemia type   Patient presents with shortness of breath after recent hospitalization with ICU admission for HCAP and sepsis.  Known  cirrhosis due to to alcoholism and Hep C; found to have a large ascites and right pleural effusion and bedside US and DG abdomen/chest.  Denies any chest pain;  EKG unchanged from previous admission and troponin negative. No current signs of infection: WBC wnl, abdomen nontender. Admits internal medicine; will likely need symptomatic paracentesis.    Jamal Collin, MD 09/21/13 [phone]

## 2013-09-21 NOTE — H&P (Signed)
[facility] History and Physical  ZEB RAWL ZOX:[id number] DOB: 01/30/1960 DOA: 09/21/2013  Referring physician: ED PCP: No PCP Per Patient  Specialists: IR  Chief Complaint: SOB  HPI: 53 y/o ? h/o chronic EOTH/Hep C, recent R Hernia repair with admission to [facility] 8/27-09/09/13 for Sepsis due to HCAP, Hep Encephalopathy, Hypoxic Resp failure, ascites. Patient was discharged home 9/9 and was evaluated on 09/12/13 and then 09/16/48 for the he is of issues related to the swelling of his abdomen as well as concern for cellulitis and discharged from the emergency room with Keflex. He was told to followup with surgery and was prescribed once again another course of Keflex by mouth 9/15. He also was noted at that time to have some swelling in his abdomen. Morning of admission 9/20 he was increasingly short of breath and he noticed significant weight gain and fluid.  He has been non-compliant with his medications of Spironolactone/lasix and has not been compliant on a low salt diet. He states he's not had significant abdominal pain, diarrhea, nausea, vomiting physician for shortness of breath, confusion improvement his baseline and he has not had any alcohol.  Lab work essentially within normal limits other than INR of 2.04 potassium 3.2 Bilirubin 2.4 AST to 48 Platelet 126 Acute abdominal series Showed increase in right-sided effusion on right side of heart, pulmonary vascular congestion without edema  Review of Systems: The patient denies Any symptoms as above  Past Medical History  Diagnosis Date  . Alcoholism   . Inguinal hernia   . Hepatitis C   . Perirectal abscess    Past Surgical History  Procedure Laterality Date  . Hernia repair     Social History:  History   Social History Narrative  . No narrative on file    Allergies  Allergen Reactions  . Bee Venom Swelling    Family History  Problem Relation Age of Onset  . CAD    . Hypertension       Prior to  Admission medications   Medication Sig Start Date End Date Taking? Authorizing Provider  albuterol (PROVENTIL HFA;VENTOLIN HFA) 108 (90 BASE) MCG/ACT inhaler Inhale 2 puffs into the lungs every 6 (six) hours as needed for wheezing or shortness of breath.   Yes Historical Provider, MD  folic acid (FOLVITE) 1 MG tablet Take 1 tablet (1 mg total) by mouth daily. 09/09/13  Yes Clydia Llano, MD  furosemide (LASIX) 40 MG tablet Take 40 mg by mouth daily. 09/09/13  Yes Clydia Llano, MD  Multiple Vitamin (MULTIVITAMIN WITH MINERALS) TABS tablet Take 1 tablet by mouth daily. 09/09/13  Yes Clydia Llano, MD  oxyCODONE (ROXICODONE) 5 MG immediate release tablet Take 1 tablet (5 mg total) by mouth every 4 (four) hours as needed for severe pain. 09/16/13  Yes Junius Finner, PA-C  PRESCRIPTION MEDICATION Take 1-2 tablets by mouth daily as needed (for constipation). "Laxative"   Yes Historical Provider, MD  thiamine 100 MG tablet Take 1 tablet (100 mg total) by mouth daily. 09/09/13  Yes Clydia Llano, MD  cephALEXin (KEFLEX) 500 MG capsule Take 1 capsule (500 mg total) by mouth 4 (four) times daily. 09/16/13   Junius Finner, PA-C   Physical Exam: Filed Vitals:   09/21/13 0750 09/21/13 0859 09/21/13 0900  BP: 139/83 145/83 145/83  Pulse: 89 74 75  Temp: 97.1 F (36.2 C)    TempSrc: Oral    Resp: Height:  (1.803 m)    Weight: 83.915  kg (185 lb)    SpO2: 96% 96% 97%     General:  EOMI, NCAT, NO ict  Eyes: see above  ENT: soft supple  Neck: no JVD, no bruit  Cardiovascular:  s1 s2 no m/r/g  Respiratory: clear no added soudn  Abdomen: obese , Flank dullness and shifting dullness noted  Skin: no le edema  Musculoskeletal:  ROm intact  Psychiatric:  intact  Neurologic: no asterixis, reflexes wnl power 5/5    Labs on Admission:  Basic Metabolic Panel:  Recent Labs Lab 09/21/13 0810  NA 137  K 3.2*  CL 103  CO2 24  GLUCOSE 98  BUN 12  CREATININE 0.84  CALCIUM 7.8*   Liver  Function Tests:  Recent Labs Lab 09/21/13 0810  AST 48*  ALT 26  ALKPHOS 76  BILITOT 2.4*  PROT 7.3  ALBUMIN 2.0*    Recent Labs Lab 09/21/13 0810  LIPASE 55   No results found for this basename: AMMONIA,  in the last 168 hours CBC:  Recent Labs Lab 09/21/13 0810  WBC 7.6  NEUTROABS 4.3  HGB 11.1*  HCT 33.2*  MCV 96.8  PLT 126*   Cardiac Enzymes:  Recent Labs Lab 09/21/13 0810  TROPONINI <0.30    BNP (last 3 results)  Recent Labs  08/28/13 1357 09/02/13 0520 09/09/13 0430  PROBNP 739.8* 348.5* 383.9*   CBG: No results found for this basename: GLUCAP,  in the last 168 hours  Radiological Exams on Admission: Dg Abd Acute W/chest  09/21/2013   CLINICAL DATA:  History of hernia surgery 2 weeks ago at [facility]. Persistent infections since. Abdominal pain and distention. History of cirrhosis.  EXAM: ACUTE ABDOMEN SERIES (ABDOMEN 2 VIEW & CHEST 1 VIEW)  COMPARISON:  CT abdomen pelvis - 09/08/2013 ; CT abdomen pelvis - 08/28/2013 chest radiograph - 09/08/2013; 09/02/2013  FINDINGS: Grossly unchanged borderline enlarged cardiac silhouette and mediastinal contours. Interval increase and now likely moderate size right-sided effusion. Unchanged small left-sided effusion. Mild cephalization of flow without frank evidence of edema.  Paucity of bowel gas without definite evidence of obstruction. There is nonspecific mild gaseous distention of a rather featureless loop of small bowel within the mid abdomen measuring approximately 3.7 cm in diameter, nonspecific in the setting of patient's known ascites. There is a minimal amount of gas seen within the descending colon. No pneumoperitoneum, pneumatosis or portal venous gas.  No acute osseus abnormalities.  IMPRESSION: 1. Interval increase in suspected moderate sized right-sided effusion. 2. Unchanged small left-sided effusion. 3. Pulmonary venous congestion without frank evidence of edema. 4. Paucity of bowel gas without  definite evidence of obstruction.   Electronically Signed   By: Simonne Come M.D.   On: 09/21/2013 09:02    EKG: Independently reviewed. PR 0.12, 5 degrees.  NO St-T wave inversions  Assessment/Plan Principal Problem:   Ascites due to alcoholic cirrhosis-patient weight on discharge from the emergency room 9/11 184 pounds, currently 185. At last INR currently perform paracentesis and get the usual labs including albumin, cell count, culture, Gram stain-we will replace fluid losses with IV albumin 25 grams. He would need education regarding low salt 2 g diet as well as taking diuretics which will be reimplemented at a dose of 522 Aldactone 2 Lasix 100 mg/40 mg respectively. We will keep him on MedSurg and monitor him. If there is no acute accumulation of fluid again over the next 24-48 hours he may be able to be discharged home Active Problems:  Abdominal wall cellulitis. This seems to be improving continue for one more day at by mouth antibiotics and then discontinue   Coagulopathy-secondary to hepatitis C as well as alcoholic liver cirrhosis-monitor INR in a.m.. Not candidate for transplant.  Consider outpatient viral titers and treatment for hepatitis C at   Leukocytosis, unspecified-stable   Non compliance w medication regimen-patient noncompliant. Education will be reinforced   Status post abdominal hernia repair 8/27 stable.   Hepatic encephalopathy risk-start lactulose   Prior history polysubstance abuse-monitor. Educate.    Time spent: 41 Obs medsug D/w sister  Rhetta Mura [facility] Pager [phone]  If 7PM-7AM, please contact night-coverage [email or web address] Password [facility] 09/21/2013, 9:52 AM

## 2013-09-21 NOTE — Care Management Note (Signed)
    Page 1 of 1   09/23/2013     12:16:25 PM CARE MANAGEMENT NOTE 09/23/2013  Patient:  Xavier Valentine, Xavier Valentine   Account Number:  [id number]  Date Initiated:  09/21/2013  Documentation initiated by:  GRAVES-BIGELOW,BRENDA  Subjective/Objective Assessment:   Pt admitted for SOB.     Action/Plan:   Pt has Medicaid from [place]. Not sure how long pt will be here. If pt will be staying he will need to get [place] Medicaid. [id number] CM to assit with additional needs.   Anticipated DC Date:  09/23/2013   Anticipated DC Plan:  HOME/SELF CARE      DC Planning Services  CM consult  [facility]  Medication Assistance      Choice offered to / List presented to:             Status of service:  Completed, signed off Medicare Important Message given?  NO (If response is "NO", the following Medicare IM given date fields will be blank) Date Medicare IM given:   Medicare IM given by:   Date Additional Medicare IM given:   Additional Medicare IM given by:    Discharge Disposition:  HOME/SELF CARE  Per UR Regulation:  Reviewed for med. necessity/level of care/duration of stay  If discussed at Long Length of Stay Meetings, dates discussed:    Comments:  09/23/13 1207 Letha Cape RN, BSN [phone] patient is for dc today, NCM informed patient  about the prices of meds, lactulose, spiralactalone, nyastatin cream and flomax, total came to $24,  NCM informed patient that he will need to go directly to [facility] when  he is dc today to get his medications.  Also he will need to call the [facility] at 9 am tomorrow to schedule a hospital f/u apt.  Patient states he understands.  09/22/2013 1530 NCM spoke to pt and states he moved to [place] to stay with his sister. He currently living with a friend in a mobile home. States he has some of his medications from home. Has no source of income. Explained Keflex is $4.00 at [facility]. Will arrange appt with [facility] for follow up with PCP on day  of discharge. Isidoro Donning RN CCM Case Mgmt phone [phone]

## 2013-09-21 NOTE — ED Provider Notes (Signed)
I saw and evaluated the patient, reviewed the resident's note and I agree with the findings and plan.   .Face to face Exam:  General:  Awake HEENT:  Atraumatic Resp:  Normal effort Abd:  Nondistended Neuro:No focal weakness  EKG reviewed and discussed with resident  Nelia Shi, MD 09/21/13 1000

## 2013-09-22 ENCOUNTER — Encounter (HOSPITAL_COMMUNITY): Payer: Self-pay | Admitting: Physician Assistant

## 2013-09-22 LAB — CBC
HCT: 36.5 % — ABNORMAL LOW (ref 39.0–52.0)
Hemoglobin: 12.3 g/dL — ABNORMAL LOW (ref 13.0–17.0)
MCH: 32.9 pg (ref 26.0–34.0)
MCHC: 33.7 g/dL (ref 30.0–36.0)
MCV: 97.6 fL (ref 78.0–100.0)
PLATELETS: 116 10*3/uL — AB (ref 150–400)
RBC: 3.74 MIL/uL — ABNORMAL LOW (ref 4.22–5.81)
RDW: 15.1 % (ref 11.5–15.5)
WBC: 7 10*3/uL (ref 4.0–10.5)

## 2013-09-22 LAB — COMPREHENSIVE METABOLIC PANEL
ALT: 25 U/L (ref 0–53)
ANION GAP: 12 (ref 5–15)
AST: 52 U/L — ABNORMAL HIGH (ref 0–37)
Albumin: 2.1 g/dL — ABNORMAL LOW (ref 3.5–5.2)
Alkaline Phosphatase: 74 U/L (ref 39–117)
BUN: 10 mg/dL (ref 6–23)
CALCIUM: 8 mg/dL — AB (ref 8.4–10.5)
CO2: 24 meq/L (ref 19–32)
CREATININE: 0.82 mg/dL (ref 0.50–1.35)
Chloride: 101 mEq/L (ref 96–112)
GFR calc Af Amer: >90 mL/min (ref >90)
Glucose, Bld: 94 mg/dL (ref 70–99)
Potassium: 3.2 mEq/L — ABNORMAL LOW (ref 3.7–5.3)
Sodium: 137 mEq/L (ref 137–147)
Total Bilirubin: 3.2 mg/dL — ABNORMAL HIGH (ref 0.3–1.2)
Total Protein: 7.4 g/dL (ref 6.0–8.3)

## 2013-09-22 LAB — PROTIME-INR
INR: 2.4 — ABNORMAL HIGH (ref 0.00–1.49)
PROTHROMBIN TIME: 26.2 s — AB (ref 11.6–15.2)

## 2013-09-22 MED ORDER — VITAMIN K1 10 MG/ML IJ SOLN
10.0000 mg | Freq: Once | INTRAVENOUS | Status: AC
  Administered 2013-09-22: 10 mg via INTRAVENOUS
  Filled 2013-09-22: qty 1

## 2013-09-22 MED ORDER — POTASSIUM CHLORIDE CRYS ER 20 MEQ PO TBCR
30.0000 meq | EXTENDED_RELEASE_TABLET | Freq: Once | ORAL | Status: AC
  Administered 2013-09-22: 30 meq via ORAL
  Filled 2013-09-22: qty 1

## 2013-09-22 MED ORDER — TAMSULOSIN HCL 0.4 MG PO CAPS
0.4000 mg | ORAL_CAPSULE | Freq: Every day | ORAL | Status: DC
  Administered 2013-09-22 – 2013-09-23 (×2): 0.4 mg via ORAL
  Filled 2013-09-22 (×2): qty 1

## 2013-09-22 NOTE — Progress Notes (Signed)
UR completed 

## 2013-09-22 NOTE — Progress Notes (Signed)
Chaplain review information for advanced directive. Patient said he would try to fill out forms later today. Chaplain requested patient ask nurse to page chaplain when form completed to review and sign. [phone]  09/22/13 0900  Clinical Encounter Type  Visited With Patient  Visit Type Initial  Referral From Nurse  Consult/Referral To Chaplain  Stress Factors  Patient Stress Factors Other (Comment) (Who will make decisions for him)  Advance Directives (For Healthcare)  Does patient have an advance directive? No  Would patient like information on creating an advanced directive? Yes - Educational materials given  Xavier Valentine 09/22/2013 9:41 AM

## 2013-09-22 NOTE — Progress Notes (Signed)
09/22/2013 1530 NCM spoke to pt and states he moved to [place] to stay with his sister. He currently living with a friend in a mobile home. States he has some of his medications from home. Has no source of income. Explained Keflex is $4.00 at [facility]. Will arrange appt with [facility] for follow up with PCP on day of discharge. Isidoro Donning RN CCM Case Mgmt phone [phone]

## 2013-09-22 NOTE — Progress Notes (Addendum)
PROGRESS NOTE  SHEENA SIMONIS WUX:[id number] DOB: 1960/11/21 DOA: 09/21/2013 PCP: No PCP Per Patient  HPI/Subjective: Arad Burston is a 53 y.o. 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 He was discharged from prior admission on 9/9, and returned both 9/11 and 9/15 for abdominal swelling with cellulitis a concern. Both visits resulted in discharge with Keflex. He was encouraged to follow up with surgery. He presented for this admission, 9/20, with increasing SOB, weight gain and fluid retention. He had been noncompliant with Spironolactone/Lasix, and reports confusion with what medications he should take based on his discharge summary from [facility].   Today the patient reports feeling better, with no abdominal pain, and improved breathing after nebulizer treatment. He also denies any fever or chills. He continues to be concerned with scrotal swelling, which has been present since before his last surgery, and has not improved. He also reports dry patch on his penis, which appeared after he had placed ice pack in genital area to try to decrease swelling.    Assessment/Plan:  Ascites -Secondary to alcoholic cirrhosis-patient weight on discharge from the emergency room 9/11-184 pounds,187 on 9/20.  -Paracentesis performed 9/20 - 3.6 L peritoneal fluid collected; 405 WBCs with 9% neutrophils, 69% lymphs, and 22% monocytes/macrophages. -Lasix and spironolactone restarted.  Will need labs 1 week post discharge. -Education given regarding low salt 2 g diet and diuretic.  Advanced Cirrhosis  -Due to alcoholism and Hep C. -GI consulted to assist with cirrhosis mgmt and make recommendations regarding hep c treatment. +Coagulopathy, +Thrombocytopenia, +significant ascites. -LFTs have trended down.   Bili was 8.6 on 8/27.  Now 3.2  Abdominal wall cellulitis -Improving with Keflex  Coagulopathy -Secondary to hepatitis C,and alcoholic liver cirrhosis -INR 2/53 = 2.04 -Will give Vitamin K x 1 to see if it helps.  Acute Urinary Retention -9/21 Bladder scan greater than revealed 700cc retained urine -Flomax ordered, foley cath inserted -Strict Is and Os  Leukocytosis, unspecified -Stable   Non compliance w medication regimen -? If patient is noncompliant or confused with medication regimen. -Education reinforced   Status post abdominal hernia repair -Performed at [facility] 08/2013. - Incision looks clean, without drainage or erythema. -Scrotal swelling remains present.  Hepatic encephalopathy risk -Hx of hepatic encephalopathy -Continue lactulose   Prior history polysubstance abuse -Monitor and educate.  -Social work consulted.      DVT Prophylaxis: SCDs  Code Status: Full Family Communication: Patient alert and oriented Disposition Plan: To sibling's home with able.  Social work consulted.  Consultants: [facility]  Procedures:  Paracentesis 9/20  Antibiotics: Anti-infectives   Start     Dose/Rate Route Frequency Ordered Stop   09/21/13 1400  cephALEXin (KEFLEX) capsule 500 mg     500 mg Oral 4 times daily 09/21/13 1224         Objective: Filed Vitals:   09/21/13 1330 09/21/13 2120 09/21/13 2251 09/22/13 0638  BP: 150/82 144/68  135/77  Pulse: 78 74 79 78  Temp: 98.2 F (36.8 C) 98.4 F (36.9 C)  98.3 F (36.8 C)  TempSrc:  Oral  Oral  Resp: Height:      Weight:      SpO2: 93% 96% 97% 92%    Intake/Output Summary (Last 24 hours) at 09/22/13 1402 Last data filed at 09/22/13 1207  Gross per 24 hour  Intake    480 ml  Output   1525 ml  Net  -1045 ml   Filed Weights   09/21/13 0750 09/21/13 1234  Weight: 83.915 kg (185 lb) 84.641 kg (186 lb 9.6 oz)    Exam: General: Well developed male lying in bed upon  entry to room, in NAD HEENT: Mild Scleral ictera, MMM. Cardiovascular: RRR, S1 S2 auscultated, no rubs, murmurs or gallops auscultated.   Respiratory: Clear to auscultation bilaterally with no rhonchi, wheezes or rales Abdomen: Soft, nontender,moderately distended Neuro: AAOx3, cranial nerves grossly intact.  Genital: Scrotum moderately swollen, dry erythematous patch on penis shaft. Psych: Normal affect and demeanor without confusion  Data Reviewed: Basic Metabolic Panel:  Recent Labs Lab 09/21/13 0810 09/22/13 1139  NA 137 137  K 3.2* 3.2*  CL 103 101  CO2 24 24  GLUCOSE 98 94  BUN 12 10  CREATININE 0.84 0.82  CALCIUM 7.8* 8.0*   Liver Function Tests:  Recent Labs Lab 09/21/13 0810 09/22/13 1139  AST 48* 52*  ALT 26 25  ALKPHOS 76 74  BILITOT 2.4* 3.2*  PROT 7.3 7.4  ALBUMIN 2.0* 2.1*    Recent Labs Lab 09/21/13 0810  LIPASE 55   CBC:  Recent Labs Lab 09/21/13 0810 09/22/13 1139  WBC 7.6 7.0  NEUTROABS 4.3  --   HGB 11.1* 12.3*  HCT 33.2* 36.5*  MCV 96.8 97.6  PLT 126* 116*   Cardiac Enzymes:  Recent Labs Lab 09/21/13 0810  TROPONINI <0.30   BNP (last 3 results)  Recent Labs  08/28/13 1357 09/02/13 0520 09/09/13 0430  PROBNP 739.8* 348.5* 383.9*     Recent Results (from the past 240 hour(s))  GRAM STAIN     Status: None   Collection Time    09/21/13 11:38 AM      Result Value Ref Range Status   Specimen Description PERITONEAL CAVITY   Final   Special Requests NONE   Final   Gram Stain     Final   Value: CYTOSPIN SLIDE     WBC PRESENT,BOTH PMN AND MONONUCLEAR     NO ORGANISMS SEEN   Report Status 09/21/2013 FINAL   Final  BODY FLUID CULTURE     Status: None   Collection Time    09/21/13 11:38 AM      Result Value Ref Range Status   Specimen Description PERITONEAL CAVITY   Final   Special Requests NONE   Final   Gram Stain     Final   Value: CYTOSPIN WBC PRESENT,BOTH PMN AND MONONUCLEAR     NO ORGANISMS SEEN     Performed  at [facility]     Performed at [facility]   Culture     Final   Value: NO GROWTH 1 DAY     Performed at [facility]   Report Status PENDING   Incomplete     Studies: US Paracentesis  09/21/2013   INDICATION: Symptomatic abdominal ascites. History of cirrhosis. Please perform diagnostic and therapeutic paracentesis.  EXAM: ULTRASOUND-GUIDED PARACENTESIS  COMPARISON:  CT the abdomen pelvis - 08/28/2013; pelvic CT -09/08/2013  MEDICATIONS: None.  COMPLICATIONS: None immediate  TECHNIQUE: Informed written consent was obtained from the patient after a discussion of the risks, benefits and alternatives to treatment. A timeout was performed prior to the initiation of the procedure.  Initial ultrasound scanning demonstrates a moderate amount of ascites within the right lower abdominal quadrant. The right  lower abdomen was prepped and draped in the usual sterile fashion. 1% lidocaine with epinephrine was used for local anesthesia. Under direct ultrasound guidance, a 19 gauge, 7-cm, Yueh catheter was introduced. An ultrasound image was saved for documentation purposed. The paracentesis was performed. The catheter was removed and a dressing was applied. The patient tolerated the procedure well without immediate post procedural complication.  FINDINGS: A total of approximately 3.6 liters of serous, yellow tinged fluid was removed. Samples were sent to the laboratory as requested by the clinical team.  IMPRESSION: Successful ultrasound-guided paracentesis yielding 3.6 liters of peritoneal fluid.   Electronically Signed   By: Simonne Come M.D.   On: 09/21/2013 12:24   Dg Abd Acute W/chest  09/21/2013   CLINICAL DATA:  History of hernia surgery 2 weeks ago at [facility]. Persistent infections since. Abdominal pain and distention. History of cirrhosis.  EXAM: ACUTE ABDOMEN SERIES (ABDOMEN 2 VIEW & CHEST 1 VIEW)  COMPARISON:  CT abdomen pelvis - 09/08/2013 ; CT abdomen pelvis - 08/28/2013  chest radiograph - 09/08/2013; 09/02/2013  FINDINGS: Grossly unchanged borderline enlarged cardiac silhouette and mediastinal contours. Interval increase and now likely moderate size right-sided effusion. Unchanged small left-sided effusion. Mild cephalization of flow without frank evidence of edema.  Paucity of bowel gas without definite evidence of obstruction. There is nonspecific mild gaseous distention of a rather featureless loop of small bowel within the mid abdomen measuring approximately 3.7 cm in diameter, nonspecific in the setting of patient's known ascites. There is a minimal amount of gas seen within the descending colon. No pneumoperitoneum, pneumatosis or portal venous gas.  No acute osseus abnormalities.  IMPRESSION: 1. Interval increase in suspected moderate sized right-sided effusion. 2. Unchanged small left-sided effusion. 3. Pulmonary venous congestion without frank evidence of edema. 4. Paucity of bowel gas without definite evidence of obstruction.   Electronically Signed   By: Simonne Come M.D.   On: 09/21/2013 09:02    Scheduled Meds: . cephALEXin  500 mg Oral QID  . furosemide  40 mg Oral Daily  . lactulose  10 g Oral TID  . nystatin ointment   Topical BID  . phytonadione (VITAMIN K) IV  10 mg Intravenous Once  . potassium chloride  30 mEq Oral Once  . spironolactone  100 mg Oral Daily  . tamsulosin  0.4 mg Oral Daily   Continuous Infusions:   Principal Problem:   Ascites due to alcoholic cirrhosis Active Problems:   Coagulopathy   Leukocytosis, unspecified   Liver failure   Non compliance w medication regimen   Ascites  Elvis Coil, PA-S2 Algis Downs, PA-C  [facility] Pager [phone]. If 7PM-7AM, please contact night-coverage at [email or web address], password [facility] 09/22/2013, 2:02 PM  LOS: 1 day   Patient was seen, examined,treatment plan was discussed with the Physician extender. I have directly reviewed the clinical findings, lab, imaging studies and  management of this patient in detail. I have made the necessary changes to the above noted documentation, and agree with the documentation, as recorded by the Physician extender.  Huey Bienenstock MD [facility].

## 2013-09-22 NOTE — Consult Note (Signed)
[facility] Consult: 12:05 PM 09/22/2013  LOS: 1 day    Referring Provider: Elgergawy  Primary Care Physician:  none Primary Gastroenterologist:  unassigned Note: on Care Everywhere for [facility]:  "no matching patients found"   Reason for Consultation:  Cirrhosis.   HPI: Xavier Valentine is a 53 y.o. male.  Hx chronic EOTH/Hep C, R Hernia repair at [facility] in mid to late 8/20-15.  Told at that time he had cirrhosis but wasn't having issues with ascites.  Non compliant with abx after this.  Admission to [facility] 8/27-09/09/13 for sepsis due to HCAP, hypoxic Resp failure, ascites, bleeding form incision site.  Coagulopathic and thrombocytopenic, anemic.  tox screen + for cocaine, opiates. Treated for ETOH withdrawal and hepatic encephalopathy. It was after his surgery and during the [facility] admission that ascites/anasarca became apparent.  CT scan of 9/7 showed prominent ascites and extensive 3rd spacing, comple fluid tracking in right inguinal to scrotum: large hydrocele with likelihood of blood in this.  Discharged on Rifaximin, lactulose, lasix and aldactone....   Recently relocated to [facility] and living with sister, homeless in [place].   Seen in ED 9/11 and 9/15 for abdominal swelling.  Keflex was prescribed for possible cellulitis.  Again not taking his prescribed meds, did not realize he needed to continue taking his multiple meds .    Pt admitted yesterday with SOB. Denies abdominal pain, diarrhea, nausea, vomiting, dyspnea, confusion.  Denies ETOH Films show right pleural effusion and vascular congestion but no edema.  Potassium low, Sodium normal. Renal funciton normal.  Minor elevation of AST and T bili 2.4 (4.5 on 9/9).  Weight is down by 10 # in last 2 weeks  Had paracentesis on 9/20: 405 WBCs.   Culture of fluid is negative thus far. Stain shows PMNs, monos but no organisms.  Hopes to be able to get Hep C treated.  Was informed of his HCV status ~ 15 years ago. Never advised of any HCCA, never had EGD or colonoscopy.  Quit ETOH about one montha ago, drank  1/2 to and entire 750 ml bottle of vodka daily before this.  Denies IVDA but admits to occasionally snorting cocaine.        Past Medical History  Diagnosis Date  . Alcoholism     chronic.   . Inguinal hernia     repaired in [place] 08/2013  . Hepatitis C 2000  . Perirectal abscess   . Cirrhosis 08/2013    ETOH and Hep C  . Ascites 09/2013  . Thrombocytopenia 09/2013  . Coagulopathy 09/2013    due to hepatic dysfunction.     Past Surgical History  Procedure Laterality Date  . Hernia repair  08/2013    at [facility] in [place].     Prior to Admission medications   Medication Sig Start Date End Date Taking? Authorizing Provider  albuterol (PROVENTIL HFA;VENTOLIN HFA) 108 (90 BASE) MCG/ACT inhaler Inhale 2 puffs into the lungs every 6 (six) hours as needed for wheezing or shortness of breath.   Yes Historical Provider,  MD  docusate sodium (COLACE) 100 MG capsule Take 100 mg by mouth 2 (two) times daily as needed for mild constipation.   Yes Historical Provider, MD  folic acid (FOLVITE) 1 MG tablet Take 1 tablet (1 mg total) by mouth daily. 09/09/13  Yes Clydia Llano, MD  furosemide (LASIX) 40 MG tablet Take 40 mg by mouth daily. 09/09/13  Yes Clydia Llano, MD  Multiple Vitamin (MULTIVITAMIN WITH MINERALS) TABS tablet Take 1 tablet by mouth daily. 09/09/13  Yes Clydia Llano, MD  oxyCODONE (ROXICODONE) 5 MG immediate release tablet Take 1 tablet (5 mg total) by mouth every 4 (four) hours as needed for severe pain. 09/16/13  Yes Junius Finner, PA-C  thiamine 100 MG tablet Take 1 tablet (100 mg total) by mouth daily. 09/09/13  Yes Clydia Llano, MD  cephALEXin (KEFLEX) 500 MG capsule Take 1 capsule (500 mg total) by mouth 4 (four) times  daily. 09/16/13   Junius Finner, PA-C    Scheduled Meds: . cephALEXin  500 mg Oral QID  . furosemide  40 mg Oral Daily  . lactulose  10 g Oral TID  . nystatin ointment   Topical BID  . spironolactone  100 mg Oral Daily  . tamsulosin  0.4 mg Oral Daily   Infusions:   PRN Meds: albuterol, neomycin-bacitracin-polymyxin, oxyCODONE   Allergies as of 09/21/2013 - Review Complete 09/21/2013  Allergen Reaction Noted  . Bee venom Swelling 08/28/2013    Family History  Problem Relation Age of Onset  . CAD    . Hypertension    . Diabetes Mother   . Cancer Father     History   Social History  . Marital Status: Single    Spouse Name: N/A    Number of Children: N/A  . Years of Education: N/A   Occupational History  . Not on file.   Social History Main Topics  . Smoking status: Former Smoker    Types: Cigarettes    Quit date: 09/21/2008  . Smokeless tobacco: Never Used  . Alcohol Use: No  . Drug Use: 0.20 per week    Special: Marijuana  . Sexual Activity: Yes    Birth Control/ Protection: Condom   Other Topics Concern  . Not on file   Social History Narrative  . No narrative on file    REVIEW OF SYSTEMS: Constitutional:  No weakness or falls ENT:  No nose bleeds Pulm:  + SOB, belly swelling leading to pressure on diaphragm CV:  No palpitations, no LE edema.  GU:  No hematuria, no frequency GI:  Per HPI.  No nausea, no dysphagia Heme:  No unusual bleeding or bruising.   Transfusions:  none Neuro:  No headaches, no peripheral tingling or numbness Derm:  No itching, no rash or sores.  Endocrine:  No sweats or chills.  No polyuria or dysuria Immunization:  Never vaccinated for Hep A or B.  Travel:  None beyond local counties in last few months.    PHYSICAL EXAM: Vital signs in last 24 hours: Filed Vitals:   09/22/13 0638  BP: 135/77  Pulse: 78  Temp: 98.3 F (36.8 C)  Resp: 20   Wt Readings from Last 3 Encounters:  09/21/13 84.641 kg (186 lb 9.6 oz)    09/12/13 83.462 kg (184 lb)  09/05/13 89.1 kg (196 lb 6.9 oz)    General: chronically ill looking WM with temporal wasting Head:  Temporal wasting, no assymetry or trauma  Eyes:  Mild icterus, no pallor Ears:  Not HOH  Nose:  No congestion or discharge Mouth:  Clear and moist.  Neck:  No mass, no JVD Lungs:  Clear .  Decreased BS in right base.  No cough.  Some dyspnea with speach Heart: RRR.  No MRG Abdomen:  Soft, markedly protuberant, not tender.   GU:  Scrotal edema.   Rectal: deferred   Musc/Skeltl: muscle wasting in limbs Extremities:  No CCE  Neurologic:  Oriented x 3.  No tremor.  No asterixis.  Good historian.  Skin:  No rash or sores Tattoos:  None seen Nodes:  No cervical adenopathy   Psych:  Pleasant, cooperative, relaxed.   Intake/Output from previous day: 09/20 0701 - 09/21 0700 In: -  Out: 1426 [Urine:1425; Stool:1] Intake/Output this shift: Total I/O In: 480 [P.O.:480] Out: -   LAB RESULTS:  Recent Labs  09/21/13 0810  WBC 7.6  HGB 11.1*  HCT 33.2*  PLT 126*   BMET Lab Results  Component Value Date   NA 137 09/21/2013   NA 133* 09/12/2013   NA 128* 09/10/2013   K 3.2* 09/21/2013   K 4.1 09/12/2013   K 4.0 09/10/2013   CL 103 09/21/2013   CL 97 09/12/2013   CL 93* 09/10/2013   CO2 24 09/21/2013   CO2 23 09/12/2013   CO2 24 09/10/2013   GLUCOSE 98 09/21/2013   GLUCOSE 159* 09/12/2013   GLUCOSE 93 09/10/2013   BUN 12 09/21/2013   BUN 16 09/12/2013   BUN 17 09/10/2013   CREATININE 0.84 09/21/2013   CREATININE 1.07 09/12/2013   CREATININE 1.06 09/10/2013   CALCIUM 7.8* 09/21/2013   CALCIUM 8.2* 09/12/2013   CALCIUM 7.6* 09/10/2013   LFT  Recent Labs  09/21/13 0810  PROT 7.3  ALBUMIN 2.0*  AST 48*  ALT 26  ALKPHOS 76  BILITOT 2.4*   PT/INR Lab Results  Component Value Date   INR 2.04* 09/12/2013   INR 2.20* 09/10/2013   INR 2.25* 09/09/2013   Hepatitis Panel No results found for this basename: HEPBSAG, HCVAB, HEPAIGM, HEPBIGM,  in the last 72  hours C-Diff No components found with this basename: cdiff   Lipase     Component Value Date/Time   LIPASE 55 09/21/2013 0810    Drugs of Abuse     Component Value Date/Time   LABOPIA POSITIVE* 08/29/2013 0022   COCAINSCRNUR POSITIVE* 08/29/2013 0022   LABBENZ NONE DETECTED 08/29/2013 0022   AMPHETMU NONE DETECTED 08/29/2013 0022   THCU NONE DETECTED 08/29/2013 0022   LABBARB NONE DETECTED 08/29/2013 0022     RADIOLOGY STUDIES: US Paracentesis  09/21/2013   INDICATION: Symptomatic abdominal ascites. History of cirrhosis. Please perform diagnostic and therapeutic paracentesis.  EXAM: ULTRASOUND-GUIDED PARACENTESIS  COMPARISON:  CT the abdomen pelvis - 08/28/2013; pelvic CT -09/08/2013  MEDICATIONS: None.  COMPLICATIONS: None immediate  TECHNIQUE: Informed written consent was obtained from the patient after a discussion of the risks, benefits and alternatives to treatment. A timeout was performed prior to the initiation of the procedure.  Initial ultrasound scanning demonstrates a moderate amount of ascites within the right lower abdominal quadrant. The right lower abdomen was prepped and draped in the usual sterile fashion. 1% lidocaine with epinephrine was used for local anesthesia. Under direct ultrasound guidance, a 19 gauge, 7-cm, Yueh catheter was introduced. An ultrasound image was saved for documentation purposed. The paracentesis was performed. The catheter was removed and a dressing was applied. The patient tolerated the procedure well without immediate post procedural complication.  FINDINGS: A total of approximately 3.6 liters of serous, yellow tinged fluid was removed. Samples were sent to the laboratory as requested by the clinical team.  IMPRESSION: Successful ultrasound-guided paracentesis yielding 3.6 liters of peritoneal fluid.   Electronically Signed   By: Simonne Come M.D.   On: 09/21/2013 12:24   Dg Abd Acute W/chest  09/21/2013   CLINICAL DATA:  History of hernia surgery 2 weeks  ago at [facility]. Persistent infections since. Abdominal pain and distention. History of cirrhosis.  EXAM: ACUTE ABDOMEN SERIES (ABDOMEN 2 VIEW & CHEST 1 VIEW)  COMPARISON:  CT abdomen pelvis - 09/08/2013 ; CT abdomen pelvis - 08/28/2013 chest radiograph - 09/08/2013; 09/02/2013  FINDINGS: Grossly unchanged borderline enlarged cardiac silhouette and mediastinal contours. Interval increase and now likely moderate size right-sided effusion. Unchanged small left-sided effusion. Mild cephalization of flow without frank evidence of edema.  Paucity of bowel gas without definite evidence of obstruction. There is nonspecific mild gaseous distention of a rather featureless loop of small bowel within the mid abdomen measuring approximately 3.7 cm in diameter, nonspecific in the setting of patient's known ascites. There is a minimal amount of gas seen within the descending colon. No pneumoperitoneum, pneumatosis or portal venous gas.  No acute osseus abnormalities.  IMPRESSION: 1. Interval increase in suspected moderate sized right-sided effusion. 2. Unchanged small left-sided effusion. 3. Pulmonary venous congestion without frank evidence of edema. 4. Paucity of bowel gas without definite evidence of obstruction.   Electronically Signed   By: Simonne Come M.D.   On: 09/21/2013 09:02    ENDOSCOPIC STUDIES: None in Epic.   IMPRESSION:   *  Cirrhosis.  Due to ETOH and hepatitis C.  Hep C never treated, diagnosed ~ 2000 MELD of 19 based on 9/11 labs (his last coags) *  Coagulopathy.  *  Ascites.  3.6 litre paracentesis on 9/20.  WBCs less than 500 and no organisms on stain or culture thus far.   *  Thrombocytopenia.  *  Recent hepatic encephalopathy along with suspected ETOH withdrawal.  No current AMS.  .   *  Repair Right inguinal  Hernia 08/2013 in [place].  *  Right pleural effusion.  Recent HCAP and sepsis admission to [facility].   *  Medical non-compliance.     PLAN:     *  Per Dr Christella Hartigan.  *   Agree with restarting Lasix 40, aldactone 100.       Jennye Moccasin  09/22/2013, 12:05 PM Pager: [phone]     ________________________________________________________________________  [facility] MD note:  I personally examined the patient, reviewed the data and agree with the assessment and plan described above.  AScites without SBP based on recent paracentesis results.  He needs to stay on the diuretic meds that have been recommended by [facility] in past month.  Have restarted lasix, aldactone.     Rob Bunting, MD [facility] Pager [phone]

## 2013-09-23 ENCOUNTER — Encounter: Payer: Self-pay | Admitting: Physician Assistant

## 2013-09-23 LAB — BASIC METABOLIC PANEL
Anion gap: 12 (ref 5–15)
BUN: 11 mg/dL (ref 6–23)
CO2: 22 mEq/L (ref 19–32)
Calcium: 8.1 mg/dL — ABNORMAL LOW (ref 8.4–10.5)
Chloride: 100 mEq/L (ref 96–112)
Creatinine, Ser: 0.73 mg/dL (ref 0.50–1.35)
Glucose, Bld: 89 mg/dL (ref 70–99)
POTASSIUM: 3.8 meq/L (ref 3.7–5.3)
Sodium: 134 mEq/L — ABNORMAL LOW (ref 137–147)

## 2013-09-23 LAB — CBC
HCT: 31.4 % — ABNORMAL LOW (ref 39.0–52.0)
Hemoglobin: 11 g/dL — ABNORMAL LOW (ref 13.0–17.0)
MCH: 32.8 pg (ref 26.0–34.0)
MCHC: 35 g/dL (ref 30.0–36.0)
MCV: 93.7 fL (ref 78.0–100.0)
Platelets: 100 10*3/uL — ABNORMAL LOW (ref 150–400)
RBC: 3.35 MIL/uL — ABNORMAL LOW (ref 4.22–5.81)
RDW: 14.9 % (ref 11.5–15.5)
WBC: 6 10*3/uL (ref 4.0–10.5)

## 2013-09-23 LAB — PATHOLOGIST SMEAR REVIEW: Path Review: REACTIVE

## 2013-09-23 MED ORDER — LACTULOSE 10 GM/15ML PO SOLN: 10.0000 g | Freq: Every day | ORAL | Status: DC

## 2013-09-23 MED ORDER — SPIRONOLACTONE 100 MG PO TABS: 100.0000 mg | ORAL_TABLET | Freq: Every day | ORAL | Status: DC

## 2013-09-23 MED ORDER — TAMSULOSIN HCL 0.4 MG PO CAPS: 0.4000 mg | ORAL_CAPSULE | Freq: Every day | ORAL | Status: DC

## 2013-09-23 MED ORDER — NYSTATIN 100000 UNIT/GM EX OINT: TOPICAL_OINTMENT | Freq: Two times a day (BID) | CUTANEOUS | Status: DC

## 2013-09-23 MED ORDER — BACITRACIN-NEOMYCIN-POLYMYXIN OINTMENT TUBE: 1.0000 "application " | TOPICAL_OINTMENT | CUTANEOUS | Status: DC | PRN

## 2013-09-23 MED ORDER — OXYCODONE HCL 5 MG PO TABS: 5.0000 mg | ORAL_TABLET | ORAL | Status: DC | PRN

## 2013-09-23 MED ORDER — LACTULOSE 10 GM/15ML PO SOLN
10.0000 g | Freq: Every day | ORAL | Status: DC
  Administered 2013-09-23: 10 g via ORAL
  Filled 2013-09-23: qty 15

## 2013-09-23 NOTE — Progress Notes (Signed)
Daily Rounding Note  09/23/2013, 9:18 AM  LOS: 2 days   SUBJECTIVE:       Foley catheter removed.  Attending MD waiting to make sure pt can urinate before sending pt home.  Staff has instructed pt as to meds he needs to continue.  Eating solids, no nausea, no abdominal pain.  Abdomen still swollen, but not as tense as before his paracentesis.  No confusion. Having BMs.   OBJECTIVE:         Vital signs in last 24 hours:    Temp:  [98 F (36.7 C)-98.3 F (36.8 C)] 98.3 F (36.8 C) (09/22 1610) Pulse Rate:  [71-87] 87 (09/22 0614) Resp:  [16] 16 (09/22 0614) BP: (140-148)/(70-89) 141/70 mmHg (09/22 0614) SpO2:  [90 %-97 %] 90 % (09/22 0614) Last BM Date: 09/23/13 Filed Weights   03-Oct-2013 0750 10/03/2013 1234  Weight: 83.915 kg (185 lb) 84.641 kg (186 lb 9.6 oz)   General: looks unwell.    Heart: RRR Chest: clear bil.  No labored breathing or cough Abdomen: distended and moderately tense, NT.  BS active.  Scrotal edema  Extremities: no CCE.  Neuro/Psych:  Oriented x 3, relaxed, in good spirits.  No tremor or asterixis.   Intake/Output from previous day: 09/21 0701 - 09/22 0700 In: 720 [P.O.:720] Out: 2250 [Urine:2250]  Intake/Output this shift: Total I/O In: -  Out: 300 [Urine:300]  Lab Results:  Recent Labs  10-03-13 0810 09/22/13 1139 09/23/13 0825  WBC 7.6 7.0 6.0  HGB 11.1* 12.3* 11.0*  HCT 33.2* 36.5* 31.4*  PLT 126* 116* 100*   BMET  Recent Labs  10/03/2013 0810 09/22/13 1139  NA 137 137  K 3.2* 3.2*  CL 103 101  CO2 24 24  GLUCOSE 98 94  BUN 12 10  CREATININE 0.84 0.82  CALCIUM 7.8* 8.0*   LFT  Recent Labs  03-Oct-2013 0810 09/22/13 1139  PROT 7.3 7.4  ALBUMIN 2.0* 2.1*  AST 48* 52*  ALT 26 25  ALKPHOS 76 74  BILITOT 2.4* 3.2*   PT/INR  Recent Labs  09/22/13 1139  LABPROT 26.2*  INR 2.40*    Studies/Results: US Paracentesis 03-Oct-2013    FINDINGS: A total of  approximately 3.6 liters of serous, yellow tinged fluid was removed. Samples were sent to the laboratory as requested by the clinical team.  IMPRESSION: Successful ultrasound-guided paracentesis yielding 3.6 liters of peritoneal fluid.   Electronically Signed   By: Simonne Come M.D.   On: 10-03-2013 12:24   Scheduled Meds: . cephALEXin  500 mg Oral QID  . furosemide  40 mg Oral Daily  . lactulose  10 g Oral TID  . nystatin ointment   Topical BID  . spironolactone  100 mg Oral Daily  . tamsulosin  0.4 mg Oral Daily   Continuous Infusions:  PRN Meds:.albuterol, neomycin-bacitracin-polymyxin, oxyCODONE   ASSESMENT:   * Cirrhosis. Due to ETOH and hepatitis C. Hep C never treated, diagnosed ~ 2000  MELD of 19 based on 9/11 labs (his last coags).  Last ETOH was before the 09/09/13 admission.  * Coagulopathy.  * Ascites. 3.6 litre paracentesis on 2022-10-04. WBCs less than 500 and no organisms on stain or culture thus far.  Lasix and Aldactone restarted. No renal injury thus far.  * Thrombocytopenia.  *  Macrocytic anemia.  * Recent hepatic encephalopathy along with suspected ETOH withdrawal. Ammonia was 60 3 weeks ago.  Was also using a  lot of vicodin leading up to the admission.  No current AMS.  * Repair Right inguinal Hernia 08/2013 in [place]. Abdominal wall cellulitis, treating with Keflex.  * Right pleural effusion. Recent HCAP and sepsis admission to [facility].  * Medical non-compliance.    PLAN   *  Daily weights. Continue diuretics and regular monitoring of chemistries. Will be following up with MDs at [facility]. *  Will reduce Lactulose to once daily. Can titrate dose to goal of 2 to 3 BMs per day.      Xavier Valentine  09/23/2013, 9:18 AM Pager: [phone]    ________________________________________________________________________  [facility] MD note:  I personally examined the patient, reviewed the data and agree with the assessment and plan described above.  I  reinforced with him need to completely abstain from further etoh and that he needs to stay on his prescribed meds, especially his diuretics used to control his ascites.  Hopefully he will follow up as out patient.   Rob Bunting, MD [facility] Pager [phone]

## 2013-09-23 NOTE — Progress Notes (Signed)
NURSING PROGRESS NOTE  LAIDEN MILLES [id number] Discharge Data: 09/23/2013 1:33 PM Attending Provider: Huey Bienenstock, MD PCP:No PCP Per Patient     Wyvonnia Dusky to be D/C'd Home per MD order.  Discussed with the patient the After Visit Summary and all questions fully answered. All IV's discontinued with no bleeding noted. All belongings returned to patient for patient to take home.   Last Vital Signs:  Blood pressure 141/70, pulse 87, temperature 98.3 F (36.8 C), temperature source Oral, resp. rate 16, height  (1.803 m), weight 84.641 kg (186 lb 9.6 oz), SpO2 90.00%.  Discharge Medication List   Medication List    STOP taking these medications       cephALEXin 500 MG capsule  Commonly known as:  KEFLEX      TAKE these medications       albuterol 108 (90 BASE) MCG/ACT inhaler  Commonly known as:  PROVENTIL HFA;VENTOLIN HFA  Inhale 2 puffs into the lungs every 6 (six) hours as needed for wheezing or shortness of breath.     docusate sodium 100 MG capsule  Commonly known as:  COLACE  Take 100 mg by mouth 2 (two) times daily as needed for mild constipation.     folic acid 1 MG tablet  Commonly known as:  FOLVITE  Take 1 tablet (1 mg total) by mouth daily.     furosemide 40 MG tablet  Commonly known as:  LASIX  Take 40 mg by mouth daily.     lactulose 10 GM/15ML solution  Commonly known as:  CHRONULAC  Take 15 mLs (10 g total) by mouth daily.     multivitamin with minerals Tabs tablet  Take 1 tablet by mouth daily.     neomycin-bacitracin-polymyxin Oint  Commonly known as:  NEOSPORIN  Apply 1 application topically as needed for wound care.     nystatin ointment  Commonly known as:  MYCOSTATIN  Apply topically 2 (two) times daily.     oxyCODONE 5 MG immediate release tablet  Commonly known as:  ROXICODONE  Take 1 tablet (5 mg total) by mouth every 4 (four) hours as needed for severe pain.     spironolactone 100 MG tablet  Commonly known as:   ALDACTONE  Take 1 tablet (100 mg total) by mouth daily.     tamsulosin 0.4 MG Caps capsule  Commonly known as:  FLOMAX  Take 1 capsule (0.4 mg total) by mouth daily.     thiamine 100 MG tablet  Take 1 tablet (100 mg total) by mouth daily.         Cathlyn Parsons, RN

## 2013-09-23 NOTE — Discharge Instructions (Signed)
Please make sure to call the [facility] and [facility] [phone]) exactly at 9 AM the morning after discharge (09/24/2013) for follow up appointment. It is extremely important to get a primary care provider in your location for medication management.  It is important that you do not eat salt and that you take your medications as prescribed.

## 2013-09-23 NOTE — Discharge Summary (Addendum)
Physician Discharge Summary  Xavier Valentine:[id number] DOB: [birth year] DOA: 09/21/2013  PCP: No PCP Per Patient  Admit date: 09/21/2013 Discharge date: 09/23/2013  Time spent:45 minutes  Recommendations for Outpatient Follow-up:  1. Call to make PCP appointment with [facility] tomorrow morning 09/24/2013 to follow up with primary care, medication compliance, and BPH.  Please check lower abdominal wall cellulitis. 2. Keep appointment with [facility] for follow up of liver function, ascites.  Newly started on spironolactone and lasix.  Discharge Diagnoses:  Principal Problem:   Ascites due to alcoholic cirrhosis Active Problems:   Coagulopathy   Leukocytosis, unspecified   Liver failure   Non compliance w medication regimen   Ascites   Discharge Condition: stable  Diet recommendation: low sodium diet  Filed Weights   09/21/13 0750 09/21/13 1234  Weight: 83.915 kg (185 lb) 84.641 kg (186 lb 9.6 oz)    History of present illness:  Xavier Valentine is a 53 y.o. Male with history of ETOH/Hep C., liver failure, hepatic encephalopathy,ascites secondary to alcoholic/Hep C cirrhosis, recent right inguinal hernia repair in early August 2015 and, admission 8/27-09/09/2013 for sepsis due to HCAP. He presented 9/20 with abdominal swelling and SOB. Paracentesis was performed, with 3.6 liters of peritoneal fluid removed. He reported being confused about his medications from previous admission, and therefore had been noncompliant with both lasix and spironolactone. A bladder scan also revealed 700 cc of retained urine and patient was subsequently catheterized. Upon discharge the patient's clinical picture is significantly improved.   Hospital Course:   Ascites  -Secondary to alcoholic cirrhosis-patient weight on discharge from the emergency room 9/11-184 pounds,187 on 9/20.  -Paracentesis performed 9/20 - 3.6 L peritoneal fluid collected; 405 WBCs with 9%  neutrophils, 69% lymphs, and 22% monocytes/macrophages.  -Lasix and spironolactone restarted while inpatient. Encouraged patient to adhere to medication regimen outpatient. Will need labs 1 week post discharge, follow-up with [facility]. -Education given regarding low salt diet.  Advanced Cirrhosis  -Secondary to alcoholism and Hep C.  -GI consulted inpatient to assist with cirrhosis mgmt and make recommendations regarding hep c treatment.  +Coagulopathy, +Thrombocytopenia, +significant ascites.  -LFTs trended down while inpatient. Bili was 8.6 on 8/27. 3.2 on 9/21  Abdominal wall cellulitis  -Improved with Keflex.  Will discontinue antibiotics at discharge.  Coagulopathy  -Secondary to hepatitis C,and alcoholic liver cirrhosis  -INR 9/21 = 2.40 -Given Vitamin K inpatient.    Acute Urinary Retention  -Bladder scan on 9/21 revealed 700cc retained urine, Flomax was ordered and foley cath inserted.  -Foley Cath was removed 9/22, and subsequent bladder scan was normal. -Flomax continued at discharge.  Leukocytosis, unspecified  -Stable while inpatient.    Non compliance w medication regimen  -Questionable if patient is noncompliant or simply confused with medication regimen.  -Education reinforced before discharge.  Status post abdominal hernia repair  -Performed at [facility] 08/2013.  -Incision appeared clean on admission and throughout inpatient stay, without drainage or erythema.  -Scrotal swelling was present on admission and prior to hernia repair, but decreased significantly before discharge.   Hepatic encephalopathy risk  -Hx of hepatic encephalopathy  -Continue low dose lactulose outpatient.  Prior history polysubstance abuse  -Monitored and educated.  -Social work consulted inpatient.   Procedures:  Paracentesis 09/21/2013  Consultations:  Social Work  Gastroenterology  Discharge Exam: Filed Vitals:   09/23/13 0614  BP: 141/70  Pulse: 87  Temp: 98.3 F  (36.8 C)  Resp: 16  General: Well developed male lying in bed in no acute distress.  HEENT: Mild Scleral ictera, MMM.  Cardiovascular: RRR, S1 S2 auscultated, no rubs, clicks, murmurs or gallops auscultated.  Respiratory: Clear to auscultation bilaterally with no rhonchi, wheezes or rales. Abdomen: Soft, non-tender throughout, moderately distended.  Neuro: Alert and oriented x 3, cranial nerves grossly intact.  Genital: Scrotum moderately swollen, dry erythematous patch on penis shaft.  Psych: Normal affect and demeanor without confusion or agitation.    Discharge Instructions  Discharge Instructions   Call MD for:  redness, tenderness, or signs of infection (pain, swelling, redness, odor or green/yellow discharge around incision site)    Complete by:  As directed      Diet - low sodium heart healthy    Complete by:  As directed      Diet - low sodium heart healthy    Complete by:  As directed      Increase activity slowly    Complete by:  As directed      Increase activity slowly    Complete by:  As directed           Discharge Medication List as of 09/23/2013  1:18 PM    START taking these medications   Details  lactulose (CHRONULAC) 10 GM/15ML solution Take 15 mLs (10 g total) by mouth daily., Starting 09/23/2013, Until Discontinued, Print    neomycin-bacitracin-polymyxin (NEOSPORIN) OINT Apply 1 application topically as needed for wound care., Starting 09/23/2013, Until Discontinued, Print    nystatin ointment (MYCOSTATIN) Apply topically 2 (two) times daily., Starting 09/23/2013, Until Discontinued, Print    spironolactone (ALDACTONE) 100 MG tablet Take 1 tablet (100 mg total) by mouth daily., Starting 09/23/2013, Until Discontinued, Print    tamsulosin (FLOMAX) 0.4 MG CAPS capsule Take 1 capsule (0.4 mg total) by mouth daily., Starting 09/23/2013, Until Discontinued, Print      CONTINUE these medications which have NOT CHANGED   Details  albuterol (PROVENTIL  HFA;VENTOLIN HFA) 108 (90 BASE) MCG/ACT inhaler Inhale 2 puffs into the lungs every 6 (six) hours as needed for wheezing or shortness of breath., Until Discontinued, Historical Med    docusate sodium (COLACE) 100 MG capsule Take 100 mg by mouth 2 (two) times daily as needed for mild constipation., Until Discontinued, Historical Med    folic acid (FOLVITE) 1 MG tablet Take 1 tablet (1 mg total) by mouth daily., Starting 09/09/2013, Until Discontinued, Print    furosemide (LASIX) 40 MG tablet Take 40 mg by mouth daily., Starting 09/09/2013, Until Discontinued, Historical Med    Multiple Vitamin (MULTIVITAMIN WITH MINERALS) TABS tablet Take 1 tablet by mouth daily., Starting 09/09/2013, Until Discontinued, Historical Med    thiamine 100 MG tablet Take 1 tablet (100 mg total) by mouth daily., Starting 09/09/2013, Until Discontinued, Print    oxyCODONE (ROXICODONE) 5 MG immediate release tablet Take 1 tablet (5 mg total) by mouth every 4 (four) hours as needed for severe pain., Starting 09/16/2013, Until Discontinued, Print      STOP taking these medications     cephALEXin (KEFLEX) 500 MG capsule        Allergies  Allergen Reactions  . Bee Venom Swelling   Follow-up Information   Follow up with Rachael Fee, MD On 10/09/2013. (9 AM to see Sondra Come, PA-C)    Specialty:  Gastroenterology   Contact information:   [address] [place] [address] [phone]       Follow up with [facility]    . (please call to make hospital follow up apt at 9 am)    Contact information:   [address] [place] [address] [phone]       The results of significant diagnostics from this hospitalization (including imaging, microbiology, ancillary and laboratory) are listed below for reference.    Significant Diagnostic Studies: Dg Chest 2 View  09/08/2013   CLINICAL DATA:  Lower abdominal pain.  Hypoxia.  EXAM: CHEST  2 VIEW  COMPARISON:   09/02/2013 and 08/30/2013.  FINDINGS: Trachea is midline. Heart size is grossly stable. Interval improvement in bilateral airspace disease with residual subsegmental atelectasis in the left midlung zone. Small bilateral effusions.  IMPRESSION: Resolved pulmonary edema with left perihilar atelectasis and residual small bilateral effusions.   Electronically Signed   By: Leanna Battles M.D.   On: 09/08/2013 10:58   Ct Abdomen Pelvis W Contrast  08/28/2013   CLINICAL DATA:  Right inguinal repair 1 week prior. Drainage from incision.  EXAM: CT ABDOMEN AND PELVIS WITH CONTRAST  TECHNIQUE: Multidetector CT imaging of the abdomen and pelvis was performed using the standard protocol following bolus administration of intravenous contrast.  CONTRAST:  OMNIPAQUE IOHEXOL 300 MG/ML SOLN, 50mL OMNIPAQUE IOHEXOL 300 MG/ML SOLN  COMPARISON:  None.  FINDINGS: Moderate bilateral pleural effusions. Consolidative opacity within the right and left lower lobes.  The liver is small and nodular in contour compatible with cirrhosis. There is a 13 mm low-attenuation lesion within the hepatic dome, suggestive of a cyst. Gallbladder is mildly distended. Portal vein is patent. Spleen is enlarged measuring 19 cm. Pancreas and bilateral adrenal glands are unremarkable. Kidneys enhance symmetrically with contrast. Normal caliber abdominal aorta.  Multiple prominent retroperitoneal lymph nodes measuring up to 1.1 cm (image 37; series 2). Urinary bladder is unremarkable. Prostate unremarkable. Sigmoid colonic diverticulosis. No evidence for acute diverticulitis wall thickening of the cecum and ascending colon, likely secondary to portal collapse of the. Re- cannulization of the paraumbilical vein.  Postoperative changes within the right inguinal region. There is a large amount of fluid within the right inguinal region extending into the scrotum through the inguinal canal. High attenuation within the right hemiscrotum may represent 2 blood  products. Overlying soft tissue edema.  No aggressive or acute appearing osseous lesions.  IMPRESSION: 1. Postoperative changes within the right inguinal region compatible with recent right inguinal hernia repair. There is a large amount of fluid extending through the inguinal canal into the right hemiscrotum which is distended with overlying soft tissue edema. High attenuation within the right hemiscrotum may represent acute blood products. Recommend correlation with ultrasound. 2. Multiple nonspecific prominent retroperitoneal lymph nodes. Recommend correlation with laboratory analysis. Additionally a followup CT in 3 months may prove helpful to ensure stability. 3. Morphologic changes to the liver compatible with cirrhosis and sequelae of portal venous hypertension including splenomegaly. 4. Moderate bilateral pleural effusions. Underlying opacities suggestive of atelectasis.   Electronically Signed   By: Annia Belt M.D.   On: 08/28/2013 21:57   US Paracentesis  09/21/2013   INDICATION: Symptomatic abdominal ascites. History of cirrhosis. Please perform diagnostic and therapeutic paracentesis.  EXAM: ULTRASOUND-GUIDED PARACENTESIS  COMPARISON:  CT the abdomen pelvis - 08/28/2013; pelvic CT -09/08/2013  MEDICATIONS: None.  COMPLICATIONS: None immediate  TECHNIQUE: Informed written consent was obtained from the patient after a discussion of the risks, benefits and alternatives to treatment. A timeout was performed prior to the initiation of the procedure.  Initial ultrasound  scanning demonstrates a moderate amount of ascites within the right lower abdominal quadrant. The right lower abdomen was prepped and draped in the usual sterile fashion. 1% lidocaine with epinephrine was used for local anesthesia. Under direct ultrasound guidance, a 19 gauge, 7-cm, Yueh catheter was introduced. An ultrasound image was saved for documentation purposed. The paracentesis was performed. The catheter was removed and a dressing  was applied. The patient tolerated the procedure well without immediate post procedural complication.  FINDINGS: A total of approximately 3.6 liters of serous, yellow tinged fluid was removed. Samples were sent to the laboratory as requested by the clinical team.  IMPRESSION: Successful ultrasound-guided paracentesis yielding 3.6 liters of peritoneal fluid.   Electronically Signed   By: Simonne Come M.D.   On: 09/21/2013 12:24   Ct Pelvis Limited W/o Cm  09/08/2013   CLINICAL DATA:  Scrotal swelling. Right inguinal hernia repair. Incisional bleeding after right inguinal hernia repair. Postoperative complication due to coagulopathy and cirrhosis.  EXAM: CT PELVIS WITHOUT CONTRAST  TECHNIQUE: Contiguous axial CT images were obtained from the iliac crests through the proximal femurs to include the scrotum and entire pelvic contents. Multiplanar reformatting was performed in the coronal and sagittal planes. This is NOT a limited exam, and in fact is protocoled to extend well further in the upper legs than a normal pelvic CT.  COMPARISON:  08/28/2013  FINDINGS: Prominent pelvic ascites. Diffuse subcutaneous edema compatible with third spacing of fluid. Mesenteric edema. Sigmoid diverticulosis. Urinary bladder unremarkable.  Right external iliac lymph node short axis diameter 1.3 cm. Clustered additional iliac and inguinal lymph nodes are present bilaterally.  Faintly visible postoperative findings from inguinal hernia repair. Very minimally hyperdense fluid tracks along the inguinal rain and surrounds the right spermatic cord, extending down into the scrotum. I can't really see the inferior epigastric vessels all that well to be able to characterize the hernia further. The right spermatic cord itself appears edematous and expanded compared to the left. This fluid collection deviates the base of the penis to the left. Scrotal skin swelling compatible with the generalized third spacing of fluid.  IMPRESSION: 1. Slightly  complex fluid collection tracks from the right inguinal ring around the edematous right spermatic cord and into the scrotum. Given that this large hydrocele measures slightly above the density of the extensive ascites in the pelvis, it may well have some involving blood products. If there is concern for testicular torsion then scrotal Doppler would be recommended. 2. Extensive third spacing of fluid with prominent ascites, subcutaneous edema, mesenteric edema, and scrotal skin swelling. 3. Mildly enlarged external iliac and inguinal lymph nodes could be reactive, neoplastic, or due to passive lymphatic congestion.   Electronically Signed   By: Herbie Baltimore M.D.   On: 09/08/2013 14:02   Dg Chest Port 1 View  09/02/2013   CLINICAL DATA:  Shortness of breath  EXAM: PORTABLE CHEST - 1 VIEW  COMPARISON:  08/30/2013  FINDINGS: Prominent cardiomediastinal contours. Central vascular congestion. Perihilar interstitial and hazy airspace opacities. Left pleural effusion has increased and predominantly collects along the periphery of the left hemithorax. Associated airspace consolidation. No pneumothorax. No interval osseous change.  IMPRESSION: Increased central vascular congestion and perihilar interstitial and airspace opacities, favor pulmonary edema.  Increase left pleural effusion and associated airspace consolidation; atelectasis versus pneumonia.   Electronically Signed   By: Jearld Lesch M.D.   On: 09/02/2013 00:49   Dg Chest Port 1 View  08/30/2013   CLINICAL DATA:  Shortness  of breath  EXAM: PORTABLE CHEST - 1 VIEW  COMPARISON:  None.  FINDINGS: Cardiac shadow is at the upper limits of normal in size. Increased density is noted in the left lung base consistent with the previously seen atelectasis and pleural effusion. The previously seen right-sided pleural effusion is less well appreciated on this exam. No other focal abnormality is noted.  IMPRESSION: Left basilar atelectasis and pleural effusion.    Electronically Signed   By: Alcide Clever M.D.   On: 08/30/2013 08:17   Dg Abd Acute W/chest  09/21/2013   CLINICAL DATA:  History of hernia surgery 2 weeks ago at [facility]. Persistent infections since. Abdominal pain and distention. History of cirrhosis.  EXAM: ACUTE ABDOMEN SERIES (ABDOMEN 2 VIEW & CHEST 1 VIEW)  COMPARISON:  CT abdomen pelvis - 09/08/2013 ; CT abdomen pelvis - 08/28/2013 chest radiograph - 09/08/2013; 09/02/2013  FINDINGS: Grossly unchanged borderline enlarged cardiac silhouette and mediastinal contours. Interval increase and now likely moderate size right-sided effusion. Unchanged small left-sided effusion. Mild cephalization of flow without frank evidence of edema.  Paucity of bowel gas without definite evidence of obstruction. There is nonspecific mild gaseous distention of a rather featureless loop of small bowel within the mid abdomen measuring approximately 3.7 cm in diameter, nonspecific in the setting of patient's known ascites. There is a minimal amount of gas seen within the descending colon. No pneumoperitoneum, pneumatosis or portal venous gas.  No acute osseus abnormalities.  IMPRESSION: 1. Interval increase in suspected moderate sized right-sided effusion. 2. Unchanged small left-sided effusion. 3. Pulmonary venous congestion without frank evidence of edema. 4. Paucity of bowel gas without definite evidence of obstruction.   Electronically Signed   By: Simonne Come M.D.   On: 09/21/2013 09:02    Microbiology: Recent Results (from the past 240 hour(s))  GRAM STAIN     Status: None   Collection Time    09/21/13 11:38 AM      Result Value Ref Range Status   Specimen Description PERITONEAL CAVITY   Final   Special Requests NONE   Final   Gram Stain     Final   Value: CYTOSPIN SLIDE     WBC PRESENT,BOTH PMN AND MONONUCLEAR     NO ORGANISMS SEEN   Report Status 09/21/2013 FINAL   Final  BODY FLUID CULTURE     Status: None   Collection Time    09/21/13 11:38 AM       Result Value Ref Range Status   Specimen Description PERITONEAL CAVITY   Final   Special Requests NONE   Final   Gram Stain     Final   Value: CYTOSPIN WBC PRESENT,BOTH PMN AND MONONUCLEAR     NO ORGANISMS SEEN     Performed at [facility]     Performed at [facility]   Culture     Final   Value: NO GROWTH 2 DAYS     Performed at [facility]   Report Status PENDING   Incomplete     Labs: Basic Metabolic Panel:  Recent Labs Lab 09/21/13 0810 09/22/13 1139 09/23/13 0825  NA 137 137 134*  K 3.2* 3.2* 3.8  CL 103 101 100  CO2 GLUCOSE 98 94 89  BUN CREATININE 0.84 0.82 0.73  CALCIUM 7.8* 8.0* 8.1*   Liver Function Tests:  Recent Labs Lab 09/21/13 0810 09/22/13 1139  AST 48* 52*  ALT 26 25  ALKPHOS 76 74  BILITOT 2.4* 3.2*  PROT 7.3 7.4  ALBUMIN 2.0* 2.1*    Recent Labs Lab 09/21/13 0810  LIPASE 55   CBC:  Recent Labs Lab 09/21/13 0810 09/22/13 1139 09/23/13 0825  WBC 7.6 7.0 6.0  NEUTROABS 4.3  --   --   HGB 11.1* 12.3* 11.0*  HCT 33.2* 36.5* 31.4*  MCV 96.8 97.6 93.7  PLT 126* 116* 100*   Cardiac Enzymes:  Recent Labs Lab 09/21/13 0810  TROPONINI <0.30   BNP: BNP (last 3 results)  Recent Labs  08/28/13 1357 09/02/13 0520 09/09/13 0430  PROBNP 739.8* 348.5* 383.9*    Signed:  Elvis Coil, PA-S2  [place], [place] [phone] [facility] 09/23/2013, 3:13 PM   Patient was seen, examined,treatment plan was discussed with the Physician extender. I have directly reviewed the clinical findings, lab, imaging studies and management of this patient in detail. I have made the necessary changes to the above noted documentation, and agree with the documentation, as recorded by the Physician extender.  Huey Bienenstock MD [facility].

## 2013-09-25 LAB — BODY FLUID CULTURE: Culture: NO GROWTH

## 2013-09-26 ENCOUNTER — Ambulatory Visit (HOSPITAL_BASED_OUTPATIENT_CLINIC_OR_DEPARTMENT_OTHER): Payer: Medicaid - Out of State | Admitting: Internal Medicine

## 2013-09-26 ENCOUNTER — Encounter: Payer: Self-pay | Admitting: Internal Medicine

## 2013-09-26 ENCOUNTER — Telehealth: Payer: Self-pay | Admitting: Internal Medicine

## 2013-09-26 VITALS — BP 140/84 | HR 89 | Temp 98.5°F | Resp 16 | Wt 177.2 lb

## 2013-09-26 DIAGNOSIS — N5089 Other specified disorders of the male genital organs: Secondary | ICD-10-CM

## 2013-09-26 DIAGNOSIS — B1920 Unspecified viral hepatitis C without hepatic coma: Secondary | ICD-10-CM | POA: Insufficient documentation

## 2013-09-26 DIAGNOSIS — N400 Enlarged prostate without lower urinary tract symptoms: Secondary | ICD-10-CM | POA: Insufficient documentation

## 2013-09-26 DIAGNOSIS — Z833 Family history of diabetes mellitus: Secondary | ICD-10-CM

## 2013-09-26 DIAGNOSIS — E876 Hypokalemia: Secondary | ICD-10-CM | POA: Insufficient documentation

## 2013-09-26 DIAGNOSIS — Z139 Encounter for screening, unspecified: Secondary | ICD-10-CM

## 2013-09-26 DIAGNOSIS — D684 Acquired coagulation factor deficiency: Secondary | ICD-10-CM

## 2013-09-26 DIAGNOSIS — F1020 Alcohol dependence, uncomplicated: Secondary | ICD-10-CM | POA: Insufficient documentation

## 2013-09-26 DIAGNOSIS — R188 Other ascites: Secondary | ICD-10-CM | POA: Insufficient documentation

## 2013-09-26 DIAGNOSIS — K7030 Alcoholic cirrhosis of liver without ascites: Secondary | ICD-10-CM | POA: Insufficient documentation

## 2013-09-26 DIAGNOSIS — Z87891 Personal history of nicotine dependence: Secondary | ICD-10-CM | POA: Insufficient documentation

## 2013-09-26 DIAGNOSIS — I10 Essential (primary) hypertension: Secondary | ICD-10-CM | POA: Insufficient documentation

## 2013-09-26 DIAGNOSIS — D696 Thrombocytopenia, unspecified: Secondary | ICD-10-CM

## 2013-09-26 DIAGNOSIS — K7031 Alcoholic cirrhosis of liver with ascites: Secondary | ICD-10-CM

## 2013-09-26 LAB — TSH: TSH: 4.437 u[IU]/mL (ref 0.350–4.500)

## 2013-09-26 LAB — HEMOGLOBIN A1C
Hgb A1c MFr Bld: 4.5 % (ref <5.7)
Mean Plasma Glucose: 82 mg/dL (ref <117)

## 2013-09-26 LAB — POTASSIUM: Potassium: 4.1 mEq/L (ref 3.5–5.3)

## 2013-09-26 NOTE — Telephone Encounter (Signed)
Pt currently taking two diuretics and is concerned about what time of day he should take them or if he should space them throughout the day.  Currently he is taking his medications at one time. Please f/u with pt.

## 2013-09-26 NOTE — Progress Notes (Signed)
Patient here to establish care Was admitted to hospital for complications from his hernia surgery

## 2013-09-26 NOTE — Progress Notes (Signed)
Patient Demographics  Xavier Valentine, is a 53 y.o. male  JXB:[id number]  ZHY:[id number]  DOB - [birth year]  CC:  Chief Complaint  Patient presents with  . Hospitalization Follow-up       HPI: Xavier Valentine is a 53 y.o. male here today to establish medical care. He has history  of alcohol abuse hepatitis C liver failure, hypertension encephalopathy, history of IV drug abuse in the past, history of pneumonia recently hospitalized with symptoms of abdominal wall swelling and shortness of breath, EMR reviewed  paracentesis was performed and 3.6 L of peritoneal fluid removed, noted patient was not compliant in taking Lasix and spironolactone, patient also had urinary retention and got catheterized, Lasix and Aldactone were restarted  patient was advised to follow with the GI, patient was treated with Keflex for abdominal wall cellulitis, his INR was elevated and he was given vitamin K, patient was continued with lactulose he has a history of hepatitic encephalopathy. Now patient is compliant in taking his medications, does report improvement in the abdominal and scrotal swelling, has been taking Flomax which helps him with urination. Patient has No headache, No chest pain, No abdominal pain - No Nausea, No new weakness tingling or numbness, No Cough - SOB.  Allergies  Allergen Reactions  . Bee Venom Swelling   Past Medical History  Diagnosis Date  . Alcoholism     chronic.   . Inguinal hernia     repaired in [place] 08/2013  . Hepatitis C 2000  . Perirectal abscess   . Cirrhosis 08/2013    ETOH and Hep C  . Ascites 09/2013  . Thrombocytopenia 09/2013  . Coagulopathy 09/2013    due to hepatic dysfunction.    Current Outpatient Prescriptions on File Prior to Visit  Medication Sig Dispense Refill  . albuterol (PROVENTIL HFA;VENTOLIN HFA) 108 (90 BASE) MCG/ACT inhaler Inhale 2 puffs into the lungs every 6 (six) hours as needed for wheezing or shortness of breath.      . docusate  sodium (COLACE) 100 MG capsule Take 100 mg by mouth 2 (two) times daily as needed for mild constipation.      . folic acid (FOLVITE) 1 MG tablet Take 1 tablet (1 mg total) by mouth daily.  30 tablet  0  . furosemide (LASIX) 40 MG tablet Take 40 mg by mouth daily.      Marland Kitchen lactulose (CHRONULAC) 10 GM/15ML solution Take 15 mLs (10 g total) by mouth daily.  240 mL  6  . Multiple Vitamin (MULTIVITAMIN WITH MINERALS) TABS tablet Take 1 tablet by mouth daily.      Marland Kitchen neomycin-bacitracin-polymyxin (NEOSPORIN) OINT Apply 1 application topically as needed for wound care.  30 g  2  . nystatin ointment (MYCOSTATIN) Apply topically 2 (two) times daily.  30 g  0  . oxyCODONE (ROXICODONE) 5 MG immediate release tablet Take 1 tablet (5 mg total) by mouth every 4 (four) hours as needed for severe pain.  15 tablet  0  . spironolactone (ALDACTONE) 100 MG tablet Take 1 tablet (100 mg total) by mouth daily.  30 tablet  2  . tamsulosin (FLOMAX) 0.4 MG CAPS capsule Take 1 capsule (0.4 mg total) by mouth daily.  30 capsule  1  . thiamine 100 MG tablet Take 1 tablet (100 mg total) by mouth daily.  30 tablet  0   No current facility-administered medications on file prior to visit.   Family History  Problem Relation Age of Onset  .  CAD    . Hypertension    . Diabetes Mother   . Hypertension Mother   . Cancer Father   . Cancer Maternal Grandfather    History   Social History  . Marital Status: Single    Spouse Name: N/A    Number of Children: N/A  . Years of Education: N/A   Occupational History  . Not on file.   Social History Main Topics  . Smoking status: Former Smoker    Types: Cigarettes    Quit date: 09/21/2008  . Smokeless tobacco: Never Used  . Alcohol Use: No  . Drug Use: 0.20 per week    Special: Marijuana  . Sexual Activity: Yes    Birth Control/ Protection: Condom   Other Topics Concern  . Not on file   Social History Narrative  . No narrative on file    Review of  Systems: Constitutional: Negative for fever, chills, diaphoresis, activity change, appetite change and fatigue. HENT: Negative for ear pain, nosebleeds, congestion, facial swelling, rhinorrhea, neck pain, neck stiffness and ear discharge.  Eyes: Negative for pain, discharge, redness, itching and visual disturbance. Respiratory: Negative for cough, choking, chest tightness, shortness of breath, wheezing and stridor.  Cardiovascular: Negative for chest pain, palpitations and leg swelling. Gastrointestinal: Negative for abdominal distention. Genitourinary: Negative for dysuria, urgency, frequency, hematuria, flank pain, decreased urine volume, difficulty urinating and dyspareunia.  Musculoskeletal: Negative for back pain, joint swelling, arthralgia and gait problem. Neurological: Negative for dizziness, tremors, seizures, syncope, facial asymmetry, speech difficulty, weakness, light-headedness, numbness and headaches.  Hematological: Negative for adenopathy. Does not bruise/bleed easily. Psychiatric/Behavioral: Negative for hallucinations, behavioral problems, confusion, dysphoric mood, decreased concentration and agitation.    Objective:   Filed Vitals:   09/26/13 0923  BP: 140/84  Pulse: 89  Temp: 98.5 F (36.9 C)  Resp: 16    Physical Exam: Constitutional: Patient appears well-developed and well-nourished. No distress. HENT: Normocephalic, atraumatic, External right and left ear normal. Oropharynx is clear and moist.  Eyes: Conjunctivae and EOM are normal. PERRLA, no scleral icterus. Neck: Normal ROM. Neck supple. No JVD. No tracheal deviation. No thyromegaly. CVS: RRR, S1/S2 +, no murmurs, no gallops, no carotid bruit.  Pulmonary: Effort and breath sounds normal, no stridor, rhonchi, wheezes, rales.  Abdominal: Soft. BS +, moderately distended , no tenderness, rebound or guarding. Hernia surgical scar healing no apparent discharge, scrotal swelling (as per patient improving)   Musculoskeletal: Normal range of motion. No edema and no tenderness.  Neuro: Alert. Normal reflexes, muscle tone coordination. No cranial nerve deficit. Skin: Skin is warm and dry. No rash noted. Not diaphoretic. No erythema. No pallor. Psychiatric: Normal mood and affect. Behavior, judgment, thought content normal.  Lab Results  Component Value Date   WBC 6.0 09/23/2013   HGB 11.0* 09/23/2013   HCT 31.4* 09/23/2013   MCV 93.7 09/23/2013   PLT 100* 09/23/2013   Lab Results  Component Value Date   CREATININE 0.73 09/23/2013   BUN 11 09/23/2013   NA 134* 09/23/2013   K 3.8 09/23/2013   CL 100 09/23/2013   CO2 22 09/23/2013    No results found for this basename: HGBA1C   Lipid Panel  No results found for this basename: chol, trig, hdl, cholhdl, vldl, ldlcalc       Assessment and plan:   1. Ascites due to alcoholic cirrhosis/2. Scrotal swelling Status post paracentesis, currently on Lasix as well as spironolactone, also taking lactulose. Patient is scheduled to see gastroenterology.  3. Thrombocytopenia Likely coagulopathy Secondary to liver failure.  4. BPH (benign prostatic hyperplasia) Patient is currently taking Flomax.  5. Hypokalemia Will repeat potassium level. - Potassium  6. Screening  - TSH - Vit D  25 hydroxy (rtn osteoporosis monitoring)  7. Family history of diabetes mellitus (DM)  - Hemoglobin A1c    Health Maintenance Patient declined flu shot  Return in about 3 months (around 12/26/2013) for hypertension.  [name], MD

## 2013-09-27 ENCOUNTER — Observation Stay (HOSPITAL_COMMUNITY): Payer: Medicaid - Out of State

## 2013-09-27 ENCOUNTER — Encounter (HOSPITAL_COMMUNITY): Payer: Self-pay | Admitting: Emergency Medicine

## 2013-09-27 ENCOUNTER — Inpatient Hospital Stay (HOSPITAL_COMMUNITY)
Admission: EM | Admit: 2013-09-27 | Discharge: 2013-10-01 | DRG: 187 | Disposition: A | Discharge status: Home / Self Care | Payer: Medicaid - Out of State | Attending: Internal Medicine | Admitting: Internal Medicine

## 2013-09-27 ENCOUNTER — Emergency Department (HOSPITAL_COMMUNITY): Payer: Medicaid - Out of State

## 2013-09-27 DIAGNOSIS — F1021 Alcohol dependence, in remission: Secondary | ICD-10-CM | POA: Diagnosis present

## 2013-09-27 DIAGNOSIS — K7682 Hepatic encephalopathy: Secondary | ICD-10-CM

## 2013-09-27 DIAGNOSIS — F1410 Cocaine abuse, uncomplicated: Secondary | ICD-10-CM | POA: Diagnosis present

## 2013-09-27 DIAGNOSIS — D689 Coagulation defect, unspecified: Secondary | ICD-10-CM | POA: Diagnosis present

## 2013-09-27 DIAGNOSIS — K7290 Hepatic failure, unspecified without coma: Secondary | ICD-10-CM

## 2013-09-27 DIAGNOSIS — J90 Pleural effusion, not elsewhere classified: Secondary | ICD-10-CM | POA: Diagnosis present

## 2013-09-27 DIAGNOSIS — R188 Other ascites: Secondary | ICD-10-CM

## 2013-09-27 DIAGNOSIS — D72829 Elevated white blood cell count, unspecified: Secondary | ICD-10-CM

## 2013-09-27 DIAGNOSIS — T148XXA Other injury of unspecified body region, initial encounter: Secondary | ICD-10-CM

## 2013-09-27 DIAGNOSIS — K7460 Unspecified cirrhosis of liver: Secondary | ICD-10-CM

## 2013-09-27 DIAGNOSIS — F1210 Cannabis abuse, uncomplicated: Secondary | ICD-10-CM | POA: Diagnosis present

## 2013-09-27 DIAGNOSIS — A419 Sepsis, unspecified organism: Secondary | ICD-10-CM

## 2013-09-27 DIAGNOSIS — Z91199 Patient's noncompliance with other medical treatment and regimen due to unspecified reason: Secondary | ICD-10-CM

## 2013-09-27 DIAGNOSIS — Z9114 Patient's other noncompliance with medication regimen: Secondary | ICD-10-CM

## 2013-09-27 DIAGNOSIS — D6959 Other secondary thrombocytopenia: Secondary | ICD-10-CM | POA: Diagnosis present

## 2013-09-27 DIAGNOSIS — D696 Thrombocytopenia, unspecified: Secondary | ICD-10-CM | POA: Diagnosis present

## 2013-09-27 DIAGNOSIS — T8189XA Other complications of procedures, not elsewhere classified, initial encounter: Secondary | ICD-10-CM | POA: Diagnosis present

## 2013-09-27 DIAGNOSIS — Z7901 Long term (current) use of anticoagulants: Secondary | ICD-10-CM | POA: Diagnosis not present

## 2013-09-27 DIAGNOSIS — E871 Hypo-osmolality and hyponatremia: Secondary | ICD-10-CM | POA: Diagnosis present

## 2013-09-27 DIAGNOSIS — Y838 Other surgical procedures as the cause of abnormal reaction of the patient, or of later complication, without mention of misadventure at the time of the procedure: Secondary | ICD-10-CM | POA: Diagnosis present

## 2013-09-27 DIAGNOSIS — K7031 Alcoholic cirrhosis of liver with ascites: Secondary | ICD-10-CM

## 2013-09-27 DIAGNOSIS — B1920 Unspecified viral hepatitis C without hepatic coma: Secondary | ICD-10-CM | POA: Diagnosis present

## 2013-09-27 DIAGNOSIS — Z833 Family history of diabetes mellitus: Secondary | ICD-10-CM

## 2013-09-27 DIAGNOSIS — D62 Acute posthemorrhagic anemia: Secondary | ICD-10-CM

## 2013-09-27 DIAGNOSIS — J189 Pneumonia, unspecified organism: Secondary | ICD-10-CM

## 2013-09-27 DIAGNOSIS — K7030 Alcoholic cirrhosis of liver without ascites: Secondary | ICD-10-CM | POA: Diagnosis present

## 2013-09-27 DIAGNOSIS — Z87891 Personal history of nicotine dependence: Secondary | ICD-10-CM

## 2013-09-27 DIAGNOSIS — F1011 Alcohol abuse, in remission: Secondary | ICD-10-CM

## 2013-09-27 DIAGNOSIS — R6520 Severe sepsis without septic shock: Secondary | ICD-10-CM

## 2013-09-27 DIAGNOSIS — B182 Chronic viral hepatitis C: Secondary | ICD-10-CM

## 2013-09-27 DIAGNOSIS — N400 Enlarged prostate without lower urinary tract symptoms: Secondary | ICD-10-CM

## 2013-09-27 DIAGNOSIS — R079 Chest pain, unspecified: Secondary | ICD-10-CM | POA: Diagnosis present

## 2013-09-27 DIAGNOSIS — Z9119 Patient's noncompliance with other medical treatment and regimen: Secondary | ICD-10-CM

## 2013-09-27 LAB — LACTATE DEHYDROGENASE, PLEURAL OR PERITONEAL FLUID: LD, Fluid: 87 U/L — ABNORMAL HIGH (ref 3–23)

## 2013-09-27 LAB — CBC WITH DIFFERENTIAL/PLATELET
Basophils Absolute: 0 10*3/uL (ref 0.0–0.1)
Basophils Relative: 0 % (ref 0–1)
Eosinophils Absolute: 0.2 10*3/uL (ref 0.0–0.7)
Eosinophils Relative: 2 % (ref 0–5)
HCT: 33.9 % — ABNORMAL LOW (ref 39.0–52.0)
HEMOGLOBIN: 11.7 g/dL — AB (ref 13.0–17.0)
LYMPHS ABS: 1.5 10*3/uL (ref 0.7–4.0)
Lymphocytes Relative: 16 % (ref 12–46)
MCH: 32.2 pg (ref 26.0–34.0)
MCHC: 34.5 g/dL (ref 30.0–36.0)
MCV: 93.4 fL (ref 78.0–100.0)
MONOS PCT: 10 % (ref 3–12)
Monocytes Absolute: 0.9 10*3/uL (ref 0.1–1.0)
NEUTROS ABS: 6.8 10*3/uL (ref 1.7–7.7)
Neutrophils Relative %: 73 % (ref 43–77)
Platelets: 93 10*3/uL — ABNORMAL LOW (ref 150–400)
RBC: 3.63 MIL/uL — AB (ref 4.22–5.81)
RDW: 14.9 % (ref 11.5–15.5)
WBC: 9.4 10*3/uL (ref 4.0–10.5)

## 2013-09-27 LAB — COMPREHENSIVE METABOLIC PANEL
ALBUMIN: 2.3 g/dL — AB (ref 3.5–5.2)
ALK PHOS: 89 U/L (ref 39–117)
ALT: 32 U/L (ref 0–53)
AST: 51 U/L — ABNORMAL HIGH (ref 0–37)
Anion gap: 10 (ref 5–15)
BUN: 12 mg/dL (ref 6–23)
CHLORIDE: 95 meq/L — AB (ref 96–112)
CO2: 24 mEq/L (ref 19–32)
Calcium: 8.5 mg/dL (ref 8.4–10.5)
Creatinine, Ser: 0.76 mg/dL (ref 0.50–1.35)
GFR calc non Af Amer: >90 mL/min (ref >90)
GLUCOSE: 107 mg/dL — AB (ref 70–99)
POTASSIUM: 4.4 meq/L (ref 3.7–5.3)
Sodium: 129 mEq/L — ABNORMAL LOW (ref 137–147)
Total Bilirubin: 3 mg/dL — ABNORMAL HIGH (ref 0.3–1.2)
Total Protein: 7.6 g/dL (ref 6.0–8.3)

## 2013-09-27 LAB — LACTATE DEHYDROGENASE: LDH: 311 U/L — AB (ref 94–250)

## 2013-09-27 LAB — BODY FLUID CELL COUNT WITH DIFFERENTIAL
Lymphs, Fluid: 52 %
Monocyte-Macrophage-Serous Fluid: 42 % — ABNORMAL LOW (ref 50–90)
Neutrophil Count, Fluid: 6 % (ref 0–25)
Total Nucleated Cell Count, Fluid: 717 cu mm (ref 0–1000)

## 2013-09-27 LAB — PROTIME-INR
INR: 2.17 — ABNORMAL HIGH (ref 0.00–1.49)
Prothrombin Time: 24.2 seconds — ABNORMAL HIGH (ref 11.6–15.2)

## 2013-09-27 LAB — PROTEIN, BODY FLUID: TOTAL PROTEIN, FLUID: 1.6 g/dL

## 2013-09-27 LAB — GLUCOSE, SEROUS FLUID: Glucose, Fluid: 138 mg/dL

## 2013-09-27 LAB — VITAMIN D 25 HYDROXY (VIT D DEFICIENCY, FRACTURES): Vit D, 25-Hydroxy: 15 ng/mL — ABNORMAL LOW (ref 30–89)

## 2013-09-27 LAB — ALBUMIN, FLUID (OTHER): Albumin, Fluid: 0.7 g/dL

## 2013-09-27 LAB — PROTEIN, TOTAL: Total Protein: 6.7 g/dL (ref 6.0–8.3)

## 2013-09-27 MED ORDER — SPIRONOLACTONE 100 MG PO TABS
100.0000 mg | ORAL_TABLET | Freq: Every day | ORAL | Status: DC
  Administered 2013-09-27 – 2013-09-29 (×3): 100 mg via ORAL
  Filled 2013-09-27 (×3): qty 1

## 2013-09-27 MED ORDER — OXYCODONE HCL 5 MG PO TABS
5.0000 mg | ORAL_TABLET | ORAL | Status: DC | PRN
  Administered 2013-09-27 – 2013-09-29 (×11): 5 mg via ORAL
  Filled 2013-09-27 (×12): qty 1

## 2013-09-27 MED ORDER — SODIUM CHLORIDE 0.9 % IJ SOLN
3.0000 mL | Freq: Two times a day (BID) | INTRAMUSCULAR | Status: DC
  Administered 2013-09-27 – 2013-09-30 (×6): 3 mL via INTRAVENOUS

## 2013-09-27 MED ORDER — VITAMIN B-1 100 MG PO TABS
100.0000 mg | ORAL_TABLET | Freq: Every day | ORAL | Status: DC
  Administered 2013-09-27 – 2013-10-01 (×5): 100 mg via ORAL
  Filled 2013-09-27 (×5): qty 1

## 2013-09-27 MED ORDER — SODIUM CHLORIDE 0.9 % IV SOLN: 250.0000 mL | INTRAVENOUS | Status: DC | PRN

## 2013-09-27 MED ORDER — BACITRACIN-NEOMYCIN-POLYMYXIN OINTMENT TUBE
1.0000 "application " | TOPICAL_OINTMENT | Freq: Three times a day (TID) | CUTANEOUS | Status: DC
  Administered 2013-09-27 – 2013-10-01 (×12): 1 via TOPICAL
  Filled 2013-09-27 (×2): qty 15

## 2013-09-27 MED ORDER — ALBUTEROL SULFATE (2.5 MG/3ML) 0.083% IN NEBU: 2.5000 mg | INHALATION_SOLUTION | RESPIRATORY_TRACT | Status: DC | PRN

## 2013-09-27 MED ORDER — LACTULOSE 10 GM/15ML PO SOLN
10.0000 g | Freq: Every day | ORAL | Status: DC
  Administered 2013-09-27: 10 g via ORAL
  Filled 2013-09-27 (×2): qty 15

## 2013-09-27 MED ORDER — OXYCODONE HCL 5 MG PO TABS
10.0000 mg | ORAL_TABLET | Freq: Once | ORAL | Status: AC
  Administered 2013-09-27: 10 mg via ORAL
  Filled 2013-09-27: qty 2

## 2013-09-27 MED ORDER — TAMSULOSIN HCL 0.4 MG PO CAPS
0.4000 mg | ORAL_CAPSULE | Freq: Every day | ORAL | Status: DC
  Administered 2013-09-27 – 2013-10-01 (×5): 0.4 mg via ORAL
  Filled 2013-09-27 (×5): qty 1

## 2013-09-27 MED ORDER — SODIUM CHLORIDE 0.9 % IJ SOLN: 3.0000 mL | INTRAMUSCULAR | Status: DC | PRN

## 2013-09-27 MED ORDER — FUROSEMIDE 10 MG/ML IJ SOLN
40.0000 mg | Freq: Two times a day (BID) | INTRAMUSCULAR | Status: DC
  Administered 2013-09-27 – 2013-09-29 (×5): 40 mg via INTRAVENOUS
  Filled 2013-09-27 (×8): qty 4

## 2013-09-27 MED ORDER — DOCUSATE SODIUM 100 MG PO CAPS: 100.0000 mg | ORAL_CAPSULE | Freq: Two times a day (BID) | ORAL | Status: DC | PRN

## 2013-09-27 MED ORDER — DEXTROSE 5 % IV SOLN
10.0000 mg | Freq: Every day | INTRAVENOUS | Status: AC
  Administered 2013-09-27 – 2013-09-29 (×3): 10 mg via INTRAVENOUS
  Filled 2013-09-27 (×3): qty 1

## 2013-09-27 MED ORDER — VANCOMYCIN HCL IN DEXTROSE 750-5 MG/150ML-% IV SOLN
750.0000 mg | Freq: Three times a day (TID) | INTRAVENOUS | Status: DC
  Administered 2013-09-27 – 2013-09-28 (×4): 750 mg via INTRAVENOUS
  Filled 2013-09-27 (×6): qty 150

## 2013-09-27 MED ORDER — ACETAMINOPHEN 650 MG RE SUPP: 650.0000 mg | Freq: Four times a day (QID) | RECTAL | Status: DC | PRN

## 2013-09-27 MED ORDER — FOLIC ACID 1 MG PO TABS
1.0000 mg | ORAL_TABLET | Freq: Every day | ORAL | Status: DC
  Administered 2013-09-27 – 2013-10-01 (×5): 1 mg via ORAL
  Filled 2013-09-27 (×5): qty 1

## 2013-09-27 MED ORDER — PNEUMOCOCCAL VAC POLYVALENT 25 MCG/0.5ML IJ INJ
0.5000 mL | INJECTION | INTRAMUSCULAR | Status: AC
  Administered 2013-09-28: 0.5 mL via INTRAMUSCULAR
  Filled 2013-09-27: qty 0.5

## 2013-09-27 MED ORDER — ONDANSETRON HCL 4 MG/2ML IJ SOLN: 4.0000 mg | Freq: Four times a day (QID) | INTRAMUSCULAR | Status: DC | PRN

## 2013-09-27 MED ORDER — ADULT MULTIVITAMIN W/MINERALS CH
1.0000 | ORAL_TABLET | Freq: Every day | ORAL | Status: DC
Start: 2013-09-27 — End: 2013-10-01
  Administered 2013-09-27 – 2013-10-01 (×5): 1 via ORAL
  Filled 2013-09-27 (×5): qty 1

## 2013-09-27 MED ORDER — ACETAMINOPHEN 325 MG PO TABS: 650.0000 mg | ORAL_TABLET | Freq: Four times a day (QID) | ORAL | Status: DC | PRN

## 2013-09-27 MED ORDER — PIPERACILLIN-TAZOBACTAM 3.375 G IVPB
3.3750 g | Freq: Three times a day (TID) | INTRAVENOUS | Status: DC
  Administered 2013-09-27 – 2013-09-28 (×5): 3.375 g via INTRAVENOUS
  Filled 2013-09-27 (×7): qty 50

## 2013-09-27 MED ORDER — ONDANSETRON HCL 4 MG PO TABS: 4.0000 mg | ORAL_TABLET | Freq: Four times a day (QID) | ORAL | Status: DC | PRN

## 2013-09-27 MED ORDER — VANCOMYCIN HCL IN DEXTROSE 1-5 GM/200ML-% IV SOLN
1000.0000 mg | Freq: Three times a day (TID) | INTRAVENOUS | Status: DC
  Filled 2013-09-27 (×2): qty 200

## 2013-09-27 MED ORDER — MORPHINE SULFATE 2 MG/ML IJ SOLN
1.0000 mg | INTRAMUSCULAR | Status: DC | PRN
  Administered 2013-09-29 – 2013-10-01 (×7): 1 mg via INTRAVENOUS
  Filled 2013-09-27 (×8): qty 1

## 2013-09-27 MED ORDER — ACETAMINOPHEN-CODEINE #3 300-30 MG PO TABS: 2.0000 | ORAL_TABLET | Freq: Once | ORAL | Status: DC

## 2013-09-27 NOTE — ED Provider Notes (Signed)
CSN: [id number]     Arrival date & time 09/27/13  0351 History   First MD Initiated Contact with Patient 09/27/13 0423     Chief Complaint  Patient presents with  . Cough  . Shortness of Breath     (Consider location/radiation/quality/duration/timing/severity/associated sxs/prior Treatment) HPI Xavier Valentine is a 53 y.o. male with past medical history of hepatitis C and alcoholic cirrhosis, coming in with right-sided rib pain. Patient states the past couple days he has had a popping sensation on his right ribs. He denies this occurring in the past. He feels that his ribs are poking into his lungs. He had a frequent cough as well. It is nonproductive. There's been no fevers or diaphoresis. He denies any other chest pain other than his ribs starting on the right side. He takes Percocet at home for pain which has relieved his pain. His pain is made worse with moving his right arm up and down.  10 Systems reviewed and are negative for acute change except as noted in the HPI.      Past Medical History  Diagnosis Date  . Alcoholism     chronic.   . Inguinal hernia     repaired in [place] 08/2013  . Hepatitis C 2000  . Perirectal abscess   . Cirrhosis 08/2013    ETOH and Hep C  . Ascites 09/2013  . Thrombocytopenia 09/2013  . Coagulopathy 09/2013    due to hepatic dysfunction.    Past Surgical History  Procedure Laterality Date  . Hernia repair  08/2013    at [facility] in [place].    Family History  Problem Relation Age of Onset  . CAD    . Hypertension    . Diabetes Mother   . Hypertension Mother   . Cancer Father   . Cancer Maternal Grandfather    History  Substance Use Topics  . Smoking status: Former Smoker    Types: Cigarettes    Quit date: 09/21/2008  . Smokeless tobacco: Never Used  . Alcohol Use: No    Review of Systems    Allergies  Bee venom  Home Medications   Prior to Admission medications   Medication Sig Start Date End Date Taking? Authorizing  Provider  albuterol (PROVENTIL HFA;VENTOLIN HFA) 108 (90 BASE) MCG/ACT inhaler Inhale 2 puffs into the lungs every 6 (six) hours as needed for wheezing or shortness of breath.   Yes Historical Provider, MD  docusate sodium (COLACE) 100 MG capsule Take 100 mg by mouth 2 (two) times daily as needed for mild constipation.   Yes Historical Provider, MD  folic acid (FOLVITE) 1 MG tablet Take 1 tablet (1 mg total) by mouth daily. 09/09/13  Yes Clydia Llano, MD  furosemide (LASIX) 40 MG tablet Take 40 mg by mouth daily. 09/09/13  Yes Clydia Llano, MD  lactulose (CHRONULAC) 10 GM/15ML solution Take 15 mLs (10 g total) by mouth daily. 09/23/13  Yes Marianne L York, PA-C  Multiple Vitamin (MULTIVITAMIN WITH MINERALS) TABS tablet Take 1 tablet by mouth daily. 09/09/13  Yes Clydia Llano, MD  neomycin-bacitracin-polymyxin (NEOSPORIN) OINT Apply 1 application topically as needed for wound care. 09/23/13  Yes Tora Kindred York, PA-C  nystatin ointment (MYCOSTATIN) Apply topically 2 (two) times daily. 09/23/13  Yes Tora Kindred York, PA-C  oxyCODONE (ROXICODONE) 5 MG immediate release tablet Take 1 tablet (5 mg total) by mouth every 4 (four) hours as needed for severe pain. 09/23/13  Yes Tora Kindred York, PA-C  spironolactone (  ALDACTONE) 100 MG tablet Take 1 tablet (100 mg total) by mouth daily. 09/23/13  Yes Tora Kindred York, PA-C  tamsulosin (FLOMAX) 0.4 MG CAPS capsule Take 1 capsule (0.4 mg total) by mouth daily. 09/23/13  Yes Tora Kindred York, PA-C  thiamine 100 MG tablet Take 1 tablet (100 mg total) by mouth daily. 09/09/13  Yes Mutaz Elmahi, MD   BP 116/76  Pulse 98  Temp(Src) 97.9 F (36.6 C) (Oral)  Resp 20  Ht  (1.803 m)  Wt 176 lb (79.833 kg)  BMI 24.56 kg/m2  SpO2 98% Physical Exam  Nursing note and vitals reviewed. Constitutional: He is oriented to person, place, and time. Vital signs are normal. He appears well-developed and well-nourished.  Non-toxic appearance. He does not appear ill. No distress.  HENT:   Head: Normocephalic and atraumatic.  Nose: Nose normal.  Mouth/Throat: Oropharynx is clear and moist. No oropharyngeal exudate.  Eyes: Conjunctivae and EOM are normal. Pupils are equal, round, and reactive to light.  Bilateral eyes are mildly jaundiced.  Neck: Normal range of motion. Neck supple. No tracheal deviation, no edema, no erythema and normal range of motion present. No mass and no thyromegaly present.  Cardiovascular: Normal rate, regular rhythm, S1 normal, S2 normal, normal heart sounds, intact distal pulses and normal pulses.  Exam reveals no gallop and no friction rub.   No murmur heard. Pulses:      Radial pulses are 2+ on the right side, and 2+ on the left side.       Dorsalis pedis pulses are 2+ on the right side, and 2+ on the left side.  Pulmonary/Chest: Effort normal. No respiratory distress. He has no wheezes. He has no rhonchi. He has no rales.  Decreased right-sided breath sounds.  Abdominal: Soft. Normal appearance and bowel sounds are normal. He exhibits no distension, no ascites and no mass. There is no hepatosplenomegaly. There is no tenderness. There is no rebound, no guarding and no CVA tenderness.  Musculoskeletal: Normal range of motion. He exhibits edema. He exhibits no tenderness.  1+ bilateral lower extremity edema to the knees.  Lymphadenopathy:    He has no cervical adenopathy.  Neurological: He is alert and oriented to person, place, and time. He has normal strength. No cranial nerve deficit or sensory deficit. GCS eye subscore is 4. GCS verbal subscore is 5. GCS motor subscore is 6.  Skin: Skin is warm, dry and intact. No petechiae and no rash noted. He is not diaphoretic. No erythema. No pallor.  Psychiatric: He has a normal mood and affect. His behavior is normal. Judgment normal.    ED Course  Procedures (including critical care time) Labs Review Labs Reviewed  CBC WITH DIFFERENTIAL - Abnormal; Notable for the following:    RBC 3.63 (*)     Hemoglobin 11.7 (*)    HCT 33.9 (*)    Platelets 93 (*)    All other components within normal limits  COMPREHENSIVE METABOLIC PANEL - Abnormal; Notable for the following:    Sodium 129 (*)    Chloride 95 (*)    Glucose, Bld 107 (*)    Albumin 2.3 (*)    AST 51 (*)    Total Bilirubin 3.0 (*)    All other components within normal limits  PROTIME-INR - Abnormal; Notable for the following:    Prothrombin Time 24.2 (*)    INR 2.17 (*)    All other components within normal limits    Imaging Review Dg Chest  2 View  09/27/2013   CLINICAL DATA:  Cough, congestion, fever, shortness of breath, right-sided chest pain. Pneumonia last week.  EXAM: CHEST  2 VIEW  COMPARISON:  09/21/2013  FINDINGS: Significant increase in right pleural effusion and basilar atelectasis/ consolidation since previous study. Now also blunting of left costophrenic angle. Normal heart size and pulmonary vascularity. Left lung is clear.  IMPRESSION: Increasing size of right pleural effusion and right basilar atelectasis/ consolidation since previous study.   Electronically Signed   By: Burman Nieves M.D.   On: 09/27/2013 05:11     EKG Interpretation None      MDM   Final diagnoses:  None    Patient does emergency department out of concern for pain in his ribs. Chest x-ray reveals a right-sided pleural effusion which is increased in size. This seems to increase slightly every couple of months he comes in emergency department. His cough is causing him significant pain. Laboratory studies will be obtained.  They revealed hyponatremia, coagulopathy which is known from his liver disease, and elevated bilirubin. Patient will be retained in the hospital for further workup of pleural effusion and possible evaluation for liver transplant.  He was given oxycodone in the ED for pain and anti-tussive properties.    He will be retained to the hospitalist service, medsurg unit.  Tomasita Crumble, MD 09/27/13 1527

## 2013-09-27 NOTE — ED Notes (Signed)
Calmer, moving and breathing  Easier, coughing less.

## 2013-09-27 NOTE — ED Notes (Signed)
Patient arrives with complaint of cough and shortness of breath which led to right rib pain. Explains that he had a fit of coughing and felt a "pop" while coughing in his right ribs. States that he can feel his rib moving. Endorses shortness of breath and is tachypneic in triage.

## 2013-09-27 NOTE — Procedures (Signed)
Thoracentesis Procedure Note  Pre-operative Diagnosis: right pleural effusion  Post-operative Diagnosis: same  Indications: to decrease dyspnea and analyze fluid   Procedure Details  Consent: Informed consent was obtained. Risks of the procedure were discussed including: infection, bleeding, pain, pneumothorax.  Under sterile conditions the patient was positioned. Betadine solution and sterile drapes were utilized.  1% buffered lidocaine was used to anesthetize the pleural  Space which was identified via real time [place]. Fluid was obtained without any difficulties and minimal blood loss.  A dressing was applied to the wound and wound care instructions were provided.   Findings 1400 ml of cloudy yellow/orange pleural fluid was obtained. A sample was sent to Pathology for cytogenetics, flow, and cell counts, as well as for infection analysis.  Complications:  None; patient tolerated the procedure well.          Condition: stable  Plan A follow up chest x-ray was ordered. Bed Rest for 0 hours. Tylenol 650 mg. for pain.   STAFF MD NOTE  - supervised procedure - post procedure patient fine  Dr. Kalman Shan, M.D., [facility].C.P Pulmonary and Critical Care Medicine Staff Physician [facility] [place] Pulmonary and Critical Care Pager: [phone], If no answer or between  15:00h - 7:00h: call [phone]  09/27/2013 6:23 PM

## 2013-09-27 NOTE — ED Notes (Addendum)
Convoluted and scattered story of chronic and acute, old and new sx & issues. Pt clarifies: "here for R axillary rib pain, worse in certain position and with certain movements, and with cough and inspiration, intermitant cough noted, relates sx to separated ribs. Recent admission. D/c'd from [facility] on Tuesday. (denies: sob, nausea, fever or dizziness). LS scant expiritory wheezing. Guarding resps. Dr. Mora Bellman, EDP in to see, at [facility]. States, "have been eating oxycodone like candy".

## 2013-09-27 NOTE — Progress Notes (Signed)
ANTIBIOTIC CONSULT NOTE - INITIAL  Pharmacy Consult for Vancomycin and Zosyn Indication: pneumonia  Allergies  Allergen Reactions  . Bee Venom Swelling    Patient Measurements: Height:  (180.3 cm) Weight: 174 lb 6.4 oz (79.107 kg) IBW/kg (Calculated) : 75.3  Vital Signs: Temp: 98.8 F (37.1 C) (09/26 0835) Temp src: Oral (09/26 0835) BP: 121/79 mmHg (09/26 0835) Pulse Rate: 89 (09/26 0835) Intake/Output from previous day:   Intake/Output from this shift:    Labs:  Recent Labs  09/27/13 0521  WBC 9.4  HGB 11.7*  PLT 93*  CREATININE 0.76   Estimated Creatinine Clearance: 111.1 ml/min (by C-G formula based on Cr of 0.76). No results found for this basename: VANCOTROUGH, Leodis Binet, VANCORANDOM, GENTTROUGH, GENTPEAK, GENTRANDOM, TOBRATROUGH, TOBRAPEAK, TOBRARND, AMIKACINPEAK, AMIKACINTROU, AMIKACIN,  in the last 72 hours   Microbiology: Recent Results (from the past 720 hour(s))  CULTURE, BLOOD (ROUTINE X 2)     Status: None   Collection Time    08/28/13  1:57 PM      Result Value Ref Range Status   Specimen Description BLOOD LEFT ANTECUBITAL   Final   Special Requests BOTTLES DRAWN AEROBIC AND ANAEROBIC   Final   Culture  Setup Time     Final   Value: 08/28/2013 16:24     Performed at [facility]   Culture     Final   Value: NO GROWTH 5 DAYS     Performed at [facility]   Report Status 09/03/2013 FINAL   Final  CULTURE, BLOOD (ROUTINE X 2)     Status: None   Collection Time    08/28/13  1:57 PM      Result Value Ref Range Status   Specimen Description BLOOD BLOOD LEFT FOREARM   Final   Special Requests BOTTLES DRAWN AEROBIC AND ANAEROBIC   Final   Culture  Setup Time     Final   Value: 08/28/2013 16:24     Performed at [facility]   Culture     Final   Value: NO GROWTH 5 DAYS     Performed at [facility]   Report Status 09/03/2013 FINAL   Final  URINE CULTURE     Status: None   Collection Time   08/28/13  3:41 PM      Result Value Ref Range Status   Specimen Description URINE, CLEAN CATCH   Final   Special Requests NONE   Final   Culture  Setup Time     Final   Value: 08/28/2013 23:33     Performed at [facility] Count     Final   Value: 9,000 COLONIES/ML     Performed at [facility]   Culture     Final   Value: INSIGNIFICANT GROWTH     Performed at [facility]   Report Status 08/30/2013 FINAL   Final  MRSA PCR SCREENING     Status: Abnormal   Collection Time    08/29/13  6:11 PM      Result Value Ref Range Status   MRSA by PCR POSITIVE (*) NEGATIVE Final   Comment:            The GeneXpert MRSA Assay (FDA     approved for NASAL specimens     only), is one component of a     comprehensive MRSA colonization     surveillance program. It is not  intended to diagnose MRSA     infection nor to guide or     monitor treatment for     MRSA infections.     RESULT CALLED TO, READ BACK BY AND VERIFIED WITH:     [facility] SHERYL RN 2050 08/29/2013 BY BOVELL,T.  CLOSTRIDIUM DIFFICILE BY PCR     Status: None   Collection Time    08/30/13  4:18 AM      Result Value Ref Range Status   C difficile by pcr NEGATIVE  NEGATIVE Final   Comment: Performed at [facility]  GRAM STAIN     Status: None   Collection Time    09/21/13 11:38 AM      Result Value Ref Range Status   Specimen Description PERITONEAL CAVITY   Final   Special Requests NONE   Final   Gram Stain     Final   Value: CYTOSPIN SLIDE     WBC PRESENT,BOTH PMN AND MONONUCLEAR     NO ORGANISMS SEEN   Report Status 09/21/2013 FINAL   Final  BODY FLUID CULTURE     Status: None   Collection Time    09/21/13 11:38 AM      Result Value Ref Range Status   Specimen Description PERITONEAL CAVITY   Final   Special Requests NONE   Final   Gram Stain     Final   Value: CYTOSPIN WBC PRESENT,BOTH PMN AND MONONUCLEAR     NO ORGANISMS SEEN     Performed at [facility]      Performed at [facility]   Culture     Final   Value: NO GROWTH 3 DAYS     Performed at [facility]   Report Status 09/25/2013 FINAL   Final    Medical History: Past Medical History  Diagnosis Date  . Alcoholism     chronic.   . Inguinal hernia     repaired in [place] 08/2013  . Hepatitis C 2000  . Perirectal abscess   . Cirrhosis 08/2013    ETOH and Hep C  . Ascites 09/2013  . Thrombocytopenia 09/2013  . Coagulopathy 09/2013    due to hepatic dysfunction.     Medications:  Scheduled:  . folic acid  1 mg Oral Daily  . furosemide  40 mg Intravenous Q12H  . lactulose  10 g Oral Daily  . multivitamin with minerals  1 tablet Oral Daily  . neomycin-bacitracin-polymyxin  1 application Topical TID  . phytonadione (VITAMIN K) IV  10 mg Intravenous Daily  . piperacillin-tazobactam (ZOSYN)  IV  3.375 g Intravenous 3 times per day  . [START ON 09/28/2013] pneumococcal 23 valent vaccine  0.5 mL Intramuscular Tomorrow-1000  . sodium chloride  3 mL Intravenous Q12H  . spironolactone  100 mg Oral Daily  . tamsulosin  0.4 mg Oral Daily  . thiamine  100 mg Oral Daily  . vancomycin  750 mg Intravenous Q8H   Assessment: 53 yoM presents to ED complaining of cough and SOB to begin empiric Zosyn and Vancomycin for HCAP (recent D/C from [facility] on 9/22).  WBC trending up but not significantly elevated.  Patient is afebrile.  PMH significant for liver cirrhosis, ascites, thrombocytopenia, and hernia (repaired in August). Given the patients comorbidities and recent hospitalizations, will start Vancomycin at 750 mg q8h and Zosyn 3.375 mg q 8 hours.  Goal of Therapy:  Vancomycin trough level 15-20 mcg/ml  Plan:  1) Start Vancomycin  750 mg q8h and Zosyn 3.375 q8h  2) Monitor SCr and VT at [facility]   Russ Halo, PharmD Clinical Pharmacist - Resident Pager: [phone] 9/26/20159:37 AM

## 2013-09-27 NOTE — ED Notes (Addendum)
Continual, persistant cough and clearing of throat with bracing of R axillary rib area. Pain med given, IV established, O2 2L [place] placed. VSS.

## 2013-09-27 NOTE — ED Notes (Signed)
admitting MD at [facility].  Niece at [facility].

## 2013-09-27 NOTE — Consult Note (Signed)
Name: Xavier Valentine MRN: [id number] DOB: 28-Feb-1960    ADMISSION DATE:  09/27/2013 CONSULTATION DATE:  09/27/13  REFERRING MD :  Rito Ehrlich TRH  CHIEF COMPLAINT:  Short of breath  BRIEF PATIENT DESCRIPTION: 60 yoM former smoker with hx cirrhosis due to ETOH, Hep C.  Hosp 08/2013 inguinal hernia repair, then again for HCAP/ Sepsis. Hosp 9/20-22 for ascites w paracentesis. Now presenting with several days of chest tightness, dyspnea, dry cough and large R pleural effusion. PCCM consulted requesting thoracentesis.  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 Medically non-compliant.Had inguinal hernia repair at [facility] in August, then came down here to be w family.Admitted late August for HCAP/Sepsis. Readmitted 9/20-22 with ascites. US thoracentesis by IR was neg CX. Says he mentioned tight chest at discharge. CXR 9/7 had shown small bilateral effusions. Says he ran out of his diuretics. Returns now with chest tightness, dyspnea, mild dry cough. Felt that he sprung a R rib with coughing, otw no chest pain, fever, bleeding.Marland Kitchen   PAST MEDICAL HISTORY :   has a past medical history of Alcoholism; Inguinal hernia; Hepatitis C (2000); Perirectal abscess; Cirrhosis (08/2013); Ascites (09/2013); Thrombocytopenia (09/2013); and Coagulopathy (09/2013).  has past surgical history that includes Hernia repair (08/2013). Prior to Admission medications   Medication Sig Start Date End Date Taking? Authorizing Provider  albuterol (PROVENTIL HFA;VENTOLIN HFA) 108 (90 BASE) MCG/ACT inhaler Inhale 2 puffs into the lungs every 6 (six) hours as needed for wheezing or shortness of breath.   Yes Historical Provider, MD  docusate sodium (COLACE) 100 MG capsule Take 100 mg by mouth 2 (two) times daily as needed for mild constipation.   Yes Historical Provider, MD  folic acid (FOLVITE) 1 MG tablet  Take 1 tablet (1 mg total) by mouth daily. 09/09/13  Yes Clydia Llano, MD  furosemide (LASIX) 40 MG tablet Take 40 mg by mouth daily. 09/09/13  Yes Clydia Llano, MD  lactulose (CHRONULAC) 10 GM/15ML solution Take 15 mLs (10 g total) by mouth daily. 09/23/13  Yes Marianne L York, PA-C  Multiple Vitamin (MULTIVITAMIN WITH MINERALS) TABS tablet Take 1 tablet by mouth daily. 09/09/13  Yes Clydia Llano, MD  neomycin-bacitracin-polymyxin (NEOSPORIN) OINT Apply 1 application topically as needed for wound care. 09/23/13  Yes Tora Kindred York, PA-C  nystatin ointment (MYCOSTATIN) Apply topically 2 (two) times daily. 09/23/13  Yes Tora Kindred York, PA-C  oxyCODONE (ROXICODONE) 5 MG immediate release tablet Take 1 tablet (5 mg total) by mouth every 4 (four) hours as needed for severe pain. 09/23/13  Yes Tora Kindred York, PA-C  spironolactone (ALDACTONE) 100 MG tablet Take 1 tablet (100 mg total) by mouth daily. 09/23/13  Yes Tora Kindred York, PA-C  tamsulosin (FLOMAX) 0.4 MG CAPS capsule Take 1 capsule (0.4 mg total) by mouth daily. 09/23/13  Yes Tora Kindred York, PA-C  thiamine 100 MG tablet Take 1 tablet (100 mg total) by mouth daily. 09/09/13  Yes Clydia Llano, MD   Allergies  Allergen Reactions  . Bee Venom Swelling    FAMILY HISTORY:  family history includes CAD in an other family member; Cancer in his father and maternal grandfather; Diabetes in his mother; Hypertension in his mother and another family member. SOCIAL HISTORY:  reports that he quit smoking about 5 years ago. His smoking use included Cigarettes. He smoked 0.00 packs per day. He has never  used smokeless tobacco. He reports that he uses illicit drugs (Marijuana). He reports that he does not drink alcohol. Has used cocaine.  REVIEW OF SYSTEMS:   Constitutional: Negative for fever, chills, weight loss, malaise/fatigue and diaphoresis.  HENT: Negative for hearing loss, ear pain, nosebleeds, congestion, sore throat, neck pain, tinnitus and ear discharge.     Eyes: Negative for blurred vision, double vision, photophobia, pain, discharge and redness.  Respiratory: + cough, No-hemoptysis, sputum production, +shortness of breath, No-wheezing and stridor.   Cardiovascular: Negative for chest pain, palpitations, orthopnea, claudication, leg swelling and PND.  Gastrointestinal: Negative for heartburn, nausea, vomiting, abdominal pain, diarrhea, constipation, blood in stool and melena.  Genitourinary: Negative for dysuria, urgency, frequency, hematuria and flank pain.  Musculoskeletal: Negative for myalgias, back pain, joint pain and falls.  Skin: Negative for itching and rash.  Neurological: Negative for dizziness, tingling, tremors, sensory change, speech change, focal weakness, seizures, loss of consciousness, weakness and headaches.  Endo/Heme/Allergies: Negative for environmental allergies and polydipsia. Does not bruise/bleed easily.  SUBJECTIVE:   VITAL SIGNS: Temp:  [97.9 F (36.6 C)-98.8 F (37.1 C)] 98.8 F (37.1 C) (09/26 0835) Pulse Rate:  [37-100] 89 (09/26 0835) Resp:  [16-32] 20 (09/26 0835) BP: (107-141)/(64-86) 121/79 mmHg (09/26 0835) SpO2:  [87 %-98 %] 96 % (09/26 0835) Weight:  [79.107 kg (174 lb 6.4 oz)-79.833 kg (176 lb)] 79.107 kg (174 lb 6.4 oz) (09/26 0835)  PHYSICAL EXAMINATION: General:  Thin wM, NAD, alert Neuro: MAE. Movements, restless, jerky, almost chorea. Talkative. Refers to inheriting large sums of money. HEENT:Mucosa clear, speech clear,nose and throat- clear Cardiovascular:  RRR, no m/g/rub noted, no JVD Lungs:  Dull R chest, unlabored, no rub/ cough, wheeze Abdomen:  Softly distended/ balotable Musculoskeletal:  Muscle atrophy extremities Skin: norash   Recent Labs Lab 09/22/13 1139 09/23/13 0825 09/26/13 0959 09/27/13 0521  NA 137 134*  --  129*  K 3.2* 3.8 4.1 4.4  CL 101 100  --  95*  CO2 24 22  --  24  BUN 10 11  --  12  CREATININE 0.82 0.73  --  0.76  GLUCOSE 94 89  --  107*    Recent  Labs Lab 09/22/13 1139 09/23/13 0825 09/27/13 0521  HGB 12.3* 11.0* 11.7*  HCT 36.5* 31.4* 33.9*  WBC 7.0 6.0 9.4  PLT 116* 100* 93*   Dg Chest 2 View  09/27/2013   CLINICAL DATA:  Cough, congestion, fever, shortness of breath, right-sided chest pain. Pneumonia last week.  EXAM: CHEST  2 VIEW  COMPARISON:  09/21/2013  FINDINGS: Significant increase in right pleural effusion and basilar atelectasis/ consolidation since previous study. Now also blunting of left costophrenic angle. Normal heart size and pulmonary vascularity. Left lung is clear.  IMPRESSION: Increasing size of right pleural effusion and right basilar atelectasis/ consolidation since previous study.   Electronically Signed   By: Burman Nieves M.D.   On: 09/27/2013 05:11    ASSESSMENT / PLAN:  Pleural effusion-R- probably transudative in this man with hx recent ascites/ uncomplicated paracentesis. He has been on prolonged cefalexin prior to this hosp, but  Non-compliant with diuretics.   Medical noncompliance- consider mild dementia. Wernicke-Korsakof  Plan: Discussed thoracentesis for labs- especially cultures and cytology, Total Protein, Cell count and diff.  Reviewed potential risks including pneumothorax, bleed, chest tube, respiratory distress,sudden death. Recognized abnormal coags. He wishes to proceed. NP will come later.  Pulmonary and Critical Care Medicine [facility] Pager: [phone]  09/27/2013, 11:32  AM

## 2013-09-27 NOTE — H&P (Signed)
[facility] History and Physical  Xavier Valentine EGB:[id number] DOB: 1960/01/19 DOA: 09/27/2013   PCP: Lorayne Marek, MD  Specialists: Patient was seen recently by gastroenterology while inpatient and by general surgery while inpatient  Chief Complaint: Right-sided chest pain or shortness of breath and cough  HPI: Xavier Valentine is a 53 y.o. male with a past medical history of liver cirrhosis thought to be secondary to alcohol as well as hepatitis C, recent admission for significant ascites, requiring paracentesis, recent inguinal hernia repair (at [facility]) with a wound infection and sepsis, recent healthcare associated pneumonia, thrombocytopenia due to his liver cirrhosis, coagulopathy due to his liver disease, who was discharged from the hospital on September 22. Patient mentioned that during that stay he was experiencing some cough and difficulty breathing. But it was not severe. He went home. He did well for about one or 2 days, but then started developing right-sided chest pain, and cough. There was a clear expectoration, without any blood. And, then last night he started having right-sided, sharp pains with coughing and deep breathing. He denies any fever. No nausea, vomiting. Has been dizzy, but denies any syncopal episode. The pain would be more severe when he would be lying down. He states being compliant with his medications. Since symptoms were getting worse he decided to present to the hospital. Of note, he was hospitalized on September 20, for significant ascites. He underwent paracentesis and about 4 L were removed. Cultures were negative. Prior to that he was hospitalized on August 27 for postoperative hematoma and infection from inguinal surgery. He also was thought to have possibly healthcare associated pneumonia. He was found to be septic. He was discharged on September 8. He underwent the right inguinal hernia surgery at [facility] on August 23 or 24th.  Despite all information obtained patient was noted to be a poor historian. His niece provided much of the information.  Home Medications: Prior to Admission medications   Medication Sig Start Date End Date Taking? Authorizing Provider  albuterol (PROVENTIL HFA;VENTOLIN HFA) 108 (90 BASE) MCG/ACT inhaler Inhale 2 puffs into the lungs every 6 (six) hours as needed for wheezing or shortness of breath.   Yes Historical Provider, MD  docusate sodium (COLACE) 100 MG capsule Take 100 mg by mouth 2 (two) times daily as needed for mild constipation.   Yes Historical Provider, MD  folic acid (FOLVITE) 1 MG tablet Take 1 tablet (1 mg total) by mouth daily. 09/09/13  Yes Verlee Monte, MD  furosemide (LASIX) 40 MG tablet Take 40 mg by mouth daily. 09/09/13  Yes Verlee Monte, MD  lactulose (CHRONULAC) 10 GM/15ML solution Take 15 mLs (10 g total) by mouth daily. 09/23/13  Yes Marianne L York, PA-C  Multiple Vitamin (MULTIVITAMIN WITH MINERALS) TABS tablet Take 1 tablet by mouth daily. 09/09/13  Yes Verlee Monte, MD  neomycin-bacitracin-polymyxin (NEOSPORIN) OINT Apply 1 application topically as needed for wound care. 09/23/13  Yes Bobby Rumpf York, PA-C  nystatin ointment (MYCOSTATIN) Apply topically 2 (two) times daily. 09/23/13  Yes Bobby Rumpf York, PA-C  oxyCODONE (ROXICODONE) 5 MG immediate release tablet Take 1 tablet (5 mg total) by mouth every 4 (four) hours as needed for severe pain. 09/23/13  Yes Bobby Rumpf York, PA-C  spironolactone (ALDACTONE) 100 MG tablet Take 1 tablet (100 mg total) by mouth daily. 09/23/13  Yes Bobby Rumpf York, PA-C  tamsulosin (FLOMAX) 0.4 MG CAPS capsule Take 1 capsule (0.4 mg total) by mouth daily. 09/23/13  Yes  Melton Alar, PA-C  thiamine 100 MG tablet Take 1 tablet (100 mg total) by mouth daily. 09/09/13  Yes Verlee Monte, MD    Allergies:  Allergies  Allergen Reactions  . Bee Venom Swelling    Past Medical History: Past Medical History  Diagnosis Date  . Alcoholism      chronic.   . Inguinal hernia     repaired in [place] 08/2013  . Hepatitis C 2000  . Perirectal abscess   . Cirrhosis 08/2013    ETOH and Hep C  . Ascites 09/2013  . Thrombocytopenia 09/2013  . Coagulopathy 09/2013    due to hepatic dysfunction.     Past Surgical History  Procedure Laterality Date  . Hernia repair  08/2013    at [facility] in [place].     Social History: Currently, is living with his family here, including his niece and sister. He denies smoking cigarettes. He hasn't had any alcohol consumption in over a month. He hasn't done any cocaine in over a month.  Family History:  Family History  Problem Relation Age of Onset  . CAD    . Hypertension    . Diabetes Mother   . Hypertension Mother   . Cancer Father   . Cancer Maternal Grandfather      Review of Systems - History obtained from the patient General ROS: positive for  - fatigue Psychological ROS: positive for - anxiety Ophthalmic ROS: negative ENT ROS: negative Allergy and Immunology ROS: negative Hematological and Lymphatic ROS: negative Endocrine ROS: negative Respiratory ROS: as in hpi Cardiovascular ROS: as in hpi Gastrointestinal ROS: as in hpi Genito-Urinary ROS: no dysuria, trouble voiding, or hematuria Musculoskeletal ROS: negative Neurological ROS: no TIA or stroke symptoms Dermatological ROS: negative  Physical Examination  Filed Vitals:   09/27/13 0600 09/27/13 0708 09/27/13 0715 09/27/13 0730  BP:  138/73 138/69 141/78  Pulse: 88 93 94 93  Temp:  97.9 F (36.6 C)    TempSrc:  Oral    Resp: 21 18 22 16   Height:      Weight:      SpO2: 93% 93% 92% 92%    BP 141/78  Pulse 93  Temp(Src) 97.9 F (36.6 C) (Oral)  Resp 16  Ht 5' 11"  (1.803 m)  Wt 79.833 kg (176 lb)  BMI 24.56 kg/m2  SpO2 92%  General appearance: alert, appears stated age, distracted and no distress Head: Normocephalic, without obvious abnormality, atraumatic Eyes: conjunctivae/corneas clear. PERRL, EOM's  intact.  Throat: lips, mucosa, and tongue normal; teeth and gums normal Back: symmetric, no curvature. ROM normal. No CVA tenderness. Resp: Decreased air entry on the right. Dullness to percussion more than half way up No definite crackles Cardio: regular rate and rhythm, S1, S2 normal, no murmur, click, rub or gallop GI: Mildly distended, without any tenderness. Bowel sounds are present. No masses, or organomegaly. He does have some swelling around his scrotal area on the right side. Some tenderness is present. He also has a wound in the right inguinal area due to his recent hernia surgery. Extremities: edema 1+ pitting edema bilateral lower extremity Pulses: 2+ and symmetric Skin: Mild erythema over the right inguinal area over the wound Neurologic: He is alert. Oriented. Somewhat distracted. Difficult to keep him on topic. But no focal neurological deficits present.  Laboratory Data: Results for orders placed during the hospital encounter of 09/27/13 (from the past 48 hour(s))  CBC WITH DIFFERENTIAL     Status: Abnormal  Collection Time    09/27/13  5:21 AM      Result Value Ref Range   WBC 9.4  4.0 - 10.5 K/uL   RBC 3.63 (*) 4.22 - 5.81 MIL/uL   Hemoglobin 11.7 (*) 13.0 - 17.0 g/dL   HCT 33.9 (*) 39.0 - 52.0 %   MCV 93.4  78.0 - 100.0 fL   MCH 32.2  26.0 - 34.0 pg   MCHC 34.5  30.0 - 36.0 g/dL   RDW 14.9  11.5 - 15.5 %   Platelets 93 (*) 150 - 400 K/uL   Comment: CONSISTENT WITH PREVIOUS RESULT   Neutrophils Relative % 73  43 - 77 %   Neutro Abs 6.8  1.7 - 7.7 K/uL   Lymphocytes Relative 16  12 - 46 %   Lymphs Abs 1.5  0.7 - 4.0 K/uL   Monocytes Relative 10  3 - 12 %   Monocytes Absolute 0.9  0.1 - 1.0 K/uL   Eosinophils Relative 2  0 - 5 %   Eosinophils Absolute 0.2  0.0 - 0.7 K/uL   Basophils Relative 0  0 - 1 %   Basophils Absolute 0.0  0.0 - 0.1 K/uL  COMPREHENSIVE METABOLIC PANEL     Status: Abnormal   Collection Time    09/27/13  5:21 AM      Result Value Ref Range     Sodium 129 (*) 137 - 147 mEq/L   Potassium 4.4  3.7 - 5.3 mEq/L   Chloride 95 (*) 96 - 112 mEq/L   CO2 24  19 - 32 mEq/L   Glucose, Bld 107 (*) 70 - 99 mg/dL   BUN 12  6 - 23 mg/dL   Creatinine, Ser 0.76  0.50 - 1.35 mg/dL   Calcium 8.5  8.4 - 10.5 mg/dL   Total Protein 7.6  6.0 - 8.3 g/dL   Albumin 2.3 (*) 3.5 - 5.2 g/dL   AST 51 (*) 0 - 37 U/L   ALT 32  0 - 53 U/L   Alkaline Phosphatase 89  39 - 117 U/L   Total Bilirubin 3.0 (*) 0.3 - 1.2 mg/dL   GFR calc non Af Amer >90  >90 mL/min   GFR calc Af Amer >90  >90 mL/min   Comment: (NOTE)     The eGFR has been calculated using the CKD EPI equation.     This calculation has not been validated in all clinical situations.     eGFR's persistently <90 mL/min signify possible Chronic Kidney     Disease.   Anion gap 10  5 - 15  PROTIME-INR     Status: Abnormal   Collection Time    09/27/13  5:21 AM      Result Value Ref Range   Prothrombin Time 24.2 (*) 11.6 - 15.2 seconds   INR 2.17 (*) 0.00 - 1.49    Radiology Reports: Dg Chest 2 View  09/27/2013   CLINICAL DATA:  Cough, congestion, fever, shortness of breath, right-sided chest pain. Pneumonia last week.  EXAM: CHEST  2 VIEW  COMPARISON:  09/21/2013  FINDINGS: Significant increase in right pleural effusion and basilar atelectasis/ consolidation since previous study. Now also blunting of left costophrenic angle. Normal heart size and pulmonary vascularity. Left lung is clear.  IMPRESSION: Increasing size of right pleural effusion and right basilar atelectasis/ consolidation since previous study.   Electronically Signed   By: Lucienne Capers M.D.   On: 09/27/2013 05:11  Problem List  Principal Problem:   Pleural effusion on right Active Problems:   Coagulopathy   Thrombocytopenia   Ascites   Liver cirrhosis   Alcohol abuse, in remission   Hepatitis C   Assessment: This is a 53 year old, Caucasian male, who presents to the hospital with right-sided pleuritic chest pain,  and shortness of breath. He is found to have a large right-sided pleural effusion, which probably accounts for his symptoms. The etiology for this pleural effusion is most likely his liver cirrhosis, but cannot rule out infection. He was hospitalized recently. He is auto anticoagulated due to his liver disease. He has thrombocytopenia due to the same.  Plan: #1 large right-sided pleural effusion: He will be empirically placed on vancomycin and Zosyn for now. However, I feel that the fluid is most likely secondary to third spacing from his liver disease. He will require intervention. So have discussed with Dr. Baird Lyons with pulmonology. He will consult on this patient and probably arrange for thoracentesis. Defer further management to pulmonology. He'll be given vitamin K due to his elevated INR. Fluid will need to be sent for analysis.  #2 liver cirrhosis, likely secondary to alcoholism and hepatitis C with ascites: He is also coagulopathic and has thrombocytopenia as a result. He underwent paracentesis about 6 days ago. He has not had any alcohol in over a month. He was seen recently by gastroenterology during his hospitalization. Apparently, has an outpatient followup with them. For now we will give him Lasix intravenously. Continue with the spironolactone. We'll give him vitamin K. Continue with his lactulose. Check ammonia level. He reports a history of hepatitis C. We'll check hepatitis panel in the morning.  #3 Right inguinal wound: This is secondary to hernia surgery. That was performed at [facility] in August. He developed hematoma and wound infection as well. Continue with antibiotic topically. He also has swelling in his groin area and right scrotum. CT pelvis report from a few days ago, was reviewed and it shows fluid around that area with prominent spermatic cords. This is probably the reason for his examination findings. This can be further monitored as an outpatient.  #4 history of  alcohol abuse, in remission: He absolutely denies any alcohol consumption in the last one month. He was counseled not to drink ever again. Continue with thiamine. No need for withdrawal protocol at this time.   DVT Prophylaxis: He is auto anticoagulated Code Status: Full code Family Communication: Discussed with the patient and his niece  Disposition Plan: Admit to the hospital   Further management decisions will depend on results of further testing and patient's response to treatment.   [facility]  [facility] Pager [phone]  If 7PM-7AM, please contact night-coverage [email or web address] Password [email or web address]  09/27/2013, 8:01 AM

## 2013-09-28 DIAGNOSIS — E871 Hypo-osmolality and hyponatremia: Secondary | ICD-10-CM

## 2013-09-28 LAB — COMPREHENSIVE METABOLIC PANEL
ALBUMIN: 1.9 g/dL — AB (ref 3.5–5.2)
ALK PHOS: 69 U/L (ref 39–117)
ALT: 28 U/L (ref 0–53)
ANION GAP: 11 (ref 5–15)
AST: 43 U/L — ABNORMAL HIGH (ref 0–37)
BILIRUBIN TOTAL: 3.9 mg/dL — AB (ref 0.3–1.2)
BUN: 15 mg/dL (ref 6–23)
CHLORIDE: 90 meq/L — AB (ref 96–112)
CO2: 26 mEq/L (ref 19–32)
Calcium: 7.8 mg/dL — ABNORMAL LOW (ref 8.4–10.5)
Creatinine, Ser: 0.91 mg/dL (ref 0.50–1.35)
GFR calc Af Amer: >90 mL/min (ref >90)
Glucose, Bld: 86 mg/dL (ref 70–99)
Potassium: 4.3 mEq/L (ref 3.7–5.3)
Sodium: 127 mEq/L — ABNORMAL LOW (ref 137–147)
Total Protein: 6.6 g/dL (ref 6.0–8.3)

## 2013-09-28 LAB — CBC
HEMATOCRIT: 30.7 % — AB (ref 39.0–52.0)
Hemoglobin: 10.8 g/dL — ABNORMAL LOW (ref 13.0–17.0)
MCH: 31.7 pg (ref 26.0–34.0)
MCHC: 35.2 g/dL (ref 30.0–36.0)
MCV: 90 fL (ref 78.0–100.0)
Platelets: 74 10*3/uL — ABNORMAL LOW (ref 150–400)
RBC: 3.41 MIL/uL — ABNORMAL LOW (ref 4.22–5.81)
RDW: 14.6 % (ref 11.5–15.5)
WBC: 10.8 10*3/uL — AB (ref 4.0–10.5)

## 2013-09-28 LAB — HEPATITIS PANEL, ACUTE
HCV AB: REACTIVE — AB
HEP A IGM: NONREACTIVE
Hep B C IgM: NONREACTIVE
Hepatitis B Surface Ag: NEGATIVE

## 2013-09-28 LAB — AMMONIA: Ammonia: 58 umol/L (ref 11–60)

## 2013-09-28 MED ORDER — LACTULOSE 10 GM/15ML PO SOLN
30.0000 g | Freq: Two times a day (BID) | ORAL | Status: DC
  Administered 2013-09-28 – 2013-10-01 (×7): 30 g via ORAL
  Filled 2013-09-28 (×8): qty 45

## 2013-09-28 NOTE — Progress Notes (Signed)
Utilization Review Completed.   Karaline Buresh, RN, BSN Nurse Case Manager  

## 2013-09-28 NOTE — Progress Notes (Signed)
PATIENT DETAILS Name: Xavier Valentine Age: 53 y.o. Sex: male Date of Birth: Nov 15, 1960 Admit Date: 09/27/2013 Admitting Physician Osvaldo Shipper, MD [id number], Ayesha Rumpf, MD  Subjective: Breathing better  Assessment/Plan: Principal Problem:   Pleural effusion on right -secondary to liver cirrhoses-pleural fluid consistent with transudate by Light's criteria -optimize diuretic regimen. No indication for infection-will stop IV Abx  Active Problems: Hyponatremia -secondary to Cirrhoses -c/w IV Lasix,Aldactone-montor Lytes  Liver Cirrhoses with coagulopathy, thrombocytopenia, ascites-secondary to Hep C and ETOH -change lactulose to 30 mg BID,c/w IV Lasix, Aldactone -claims no drink/ETOH since early Sept -has outpatient appt with GI next month    Coagulopathy -secondary to above -monitor INR periodically    Thrombocytopenia -stable -monitor CBC periodically  Recent Right Hernia Repair -wound/incision site-with no evidence of infection  Alcohol use -claims no drink/Etoh since early Sept-counseled extensively -no signs of withdrawal  Hx of Cocaine Abuse -counseled extensively-claims last use was in Early sept  Disposition: Remain inpatient  DVT Prophylaxis: SCD's  Code Status: Full code  Family Communication None at bedside  Procedures:  None  CONSULTS:  PCCM  Time spent 40 minutes-which includes 50% of the time with face-to-face with patient/ family and coordinating care related to the above assessment and plan.  MEDICATIONS: Scheduled Meds: . folic acid  1 mg Oral Daily  . furosemide  40 mg Intravenous Q12H  . lactulose  30 g Oral BID  . multivitamin with minerals  1 tablet Oral Daily  . neomycin-bacitracin-polymyxin  1 application Topical TID  . phytonadione (VITAMIN K) IV  10 mg Intravenous Daily  . piperacillin-tazobactam (ZOSYN)  IV  3.375 g Intravenous 3 times per day  . pneumococcal 23 valent vaccine  0.5 mL Intramuscular  Tomorrow-1000  . sodium chloride  3 mL Intravenous Q12H  . spironolactone  100 mg Oral Daily  . tamsulosin  0.4 mg Oral Daily  . thiamine  100 mg Oral Daily  . vancomycin  750 mg Intravenous Q8H   Continuous Infusions:  PRN Meds:.sodium chloride, acetaminophen, acetaminophen, albuterol, docusate sodium, morphine injection, ondansetron (ZOFRAN) IV, ondansetron, oxyCODONE, sodium chloride  Antibiotics: Anti-infectives   Start     Dose/Rate Route Frequency Ordered Stop   09/27/13 0830  vancomycin (VANCOCIN) IVPB 1000 mg/200 mL premix  Status:  Discontinued     1,000 mg 200 mL/hr over 60 Minutes Intravenous Every 8 hours 09/27/13 0823 09/27/13 0827   09/27/13 0830  piperacillin-tazobactam (ZOSYN) IVPB 3.375 g     3.375 g 12.5 mL/hr over 240 Minutes Intravenous 3 times per day 09/27/13 0823     09/27/13 0830  vancomycin (VANCOCIN) IVPB 750 mg/150 ml premix     750 mg 150 mL/hr over 60 Minutes Intravenous Every 8 hours 09/27/13 0827         PHYSICAL EXAM: Vital signs in last 24 hours: Filed Vitals:   09/27/13 0730 09/27/13 0835 09/27/13 2125 09/28/13 0603  BP: 141/78 121/79 119/63 115/63  Pulse: 93 89 91 82  Temp:  98.8 F (37.1 C) 98.4 F (36.9 C) 98.3 F (36.8 C)  TempSrc:  Oral Oral Oral  Resp: Height:   (1.803 m)    Weight:  79.107 kg (174 lb 6.4 oz)  80.6 kg (177 lb 11.1 oz)  SpO2: 92% 96% 96% 98%    Weight change: -0.726 kg (-1 lb 9.6 oz) Filed Weights   09/27/13 0402 09/27/13 0835 09/28/13 0603  Weight: 79.833 kg (176  lb) 79.107 kg (174 lb 6.4 oz) 80.6 kg (177 lb 11.1 oz)   Body mass index is 24.79 kg/(m^2).   Gen Exam: Awake and alert with clear speech.   Neck: Supple, No JVD.   Chest: B/L Clear-still with decreased air entry on right>left base CVS: S1 S2 Regular, no murmurs.  Abdomen: soft, BS +, non tender,mildly distended-not at all tense-but dull flanks on percussion Extremities: no edema, lower extremities warm to touch. Neurologic:  Non Focal.   Skin: No Rash.   Wounds: N/A.   Intake/Output from previous day:  Intake/Output Summary (Last 24 hours) at 09/28/13 1417 Last data filed at 09/28/13 6045  Gross per 24 hour  Intake   1012 ml  Output   1000 ml  Net     12 ml     LAB RESULTS: CBC  Recent Labs Lab 09/22/13 1139 09/23/13 0825 09/27/13 0521 09/28/13 0626  WBC 7.0 6.0 9.4 10.8*  HGB 12.3* 11.0* 11.7* 10.8*  HCT 36.5* 31.4* 33.9* 30.7*  PLT 116* 100* 93* 74*  MCV 97.6 93.7 93.4 90.0  MCH 32.9 32.8 32.2 31.7  MCHC 33.7 35.0 34.5 35.2  RDW 15.1 14.9 14.9 14.6  LYMPHSABS  --   --  1.5  --   MONOABS  --   --  0.9  --   EOSABS  --   --  0.2  --   BASOSABS  --   --  0.0  --     Chemistries   Recent Labs Lab 09/22/13 1139 09/23/13 0825 09/26/13 0959 09/27/13 0521 09/28/13 0626  NA 137 134*  --  129* 127*  K 3.2* 3.8 4.1 4.4 4.3  CL 101 100  --  95* 90*  CO2 24 22  --  24 26  GLUCOSE 94 89  --  107* 86  BUN 10 11  --  12 15  CREATININE 0.82 0.73  --  0.76 0.91  CALCIUM 8.0* 8.1*  --  8.5 7.8*    CBG: No results found for this basename: GLUCAP,  in the last 168 hours  GFR Estimated Creatinine Clearance: 97.7 ml/min (by C-G formula based on Cr of 0.91).  Coagulation profile  Recent Labs Lab 09/22/13 1139 09/27/13 0521  INR 2.40* 2.17*    Cardiac Enzymes No results found for this basename: CK, CKMB, TROPONINI, MYOGLOBIN,  in the last 168 hours  No components found with this basename: POCBNP,  No results found for this basename: DDIMER,  in the last 72 hours  Recent Labs  09/26/13 0959  HGBA1C 4.5   No results found for this basename: CHOL, HDL, LDLCALC, TRIG, CHOLHDL, LDLDIRECT,  in the last 72 hours  Recent Labs  09/26/13 0959  TSH 4.437   No results found for this basename: VITAMINB12, FOLATE, FERRITIN, TIBC, IRON, RETICCTPCT,  in the last 72 hours No results found for this basename: LIPASE, AMYLASE,  in the last 72 hours  Urine Studies No results found for  this basename: UACOL, UAPR, USPG, UPH, UTP, UGL, UKET, UBIL, UHGB, UNIT, UROB, ULEU, UEPI, UWBC, URBC, UBAC, CAST, CRYS, UCOM, BILUA,  in the last 72 hours  MICROBIOLOGY: Recent Results (from the past 240 hour(s))  GRAM STAIN     Status: None   Collection Time    09/21/13 11:38 AM      Result Value Ref Range Status   Specimen Description PERITONEAL CAVITY   Final   Special Requests NONE   Final   Gram Stain  Final   Value: CYTOSPIN SLIDE     WBC PRESENT,BOTH PMN AND MONONUCLEAR     NO ORGANISMS SEEN   Report Status 09/21/2013 FINAL   Final  BODY FLUID CULTURE     Status: None   Collection Time    09/21/13 11:38 AM      Result Value Ref Range Status   Specimen Description PERITONEAL CAVITY   Final   Special Requests NONE   Final   Gram Stain     Final   Value: CYTOSPIN WBC PRESENT,BOTH PMN AND MONONUCLEAR     NO ORGANISMS SEEN     Performed at [facility]     Performed at [facility]   Culture     Final   Value: NO GROWTH 3 DAYS     Performed at [facility]   Report Status 09/25/2013 FINAL   Final  BODY FLUID CULTURE     Status: None   Collection Time    09/27/13  6:25 PM      Result Value Ref Range Status   Specimen Description PLEURAL RIGHT FLUID   Final   Special Requests NONE   Final   Gram Stain     Final   Value: FEW WBC PRESENT, PREDOMINANTLY MONONUCLEAR     NO ORGANISMS SEEN     Performed at [facility]   Culture PENDING   Incomplete   Report Status PENDING   Incomplete    RADIOLOGY STUDIES/RESULTS: Dg Chest 2 View  09/27/2013   CLINICAL DATA:  Cough, congestion, fever, shortness of breath, right-sided chest pain. Pneumonia last week.  EXAM: CHEST  2 VIEW  COMPARISON:  09/21/2013  FINDINGS: Significant increase in right pleural effusion and basilar atelectasis/ consolidation since previous study. Now also blunting of left costophrenic angle. Normal heart size and pulmonary vascularity. Left lung is clear.  IMPRESSION:  Increasing size of right pleural effusion and right basilar atelectasis/ consolidation since previous study.   Electronically Signed   By: Burman Nieves M.D.   On: 09/27/2013 05:11   Dg Chest 2 View  09/08/2013   CLINICAL DATA:  Lower abdominal pain.  Hypoxia.  EXAM: CHEST  2 VIEW  COMPARISON:  09/02/2013 and 08/30/2013.  FINDINGS: Trachea is midline. Heart size is grossly stable. Interval improvement in bilateral airspace disease with residual subsegmental atelectasis in the left midlung zone. Small bilateral effusions.  IMPRESSION: Resolved pulmonary edema with left perihilar atelectasis and residual small bilateral effusions.   Electronically Signed   By: Leanna Battles M.D.   On: 09/08/2013 10:58   US Paracentesis  09/21/2013   INDICATION: Symptomatic abdominal ascites. History of cirrhosis. Please perform diagnostic and therapeutic paracentesis.  EXAM: ULTRASOUND-GUIDED PARACENTESIS  COMPARISON:  CT the abdomen pelvis - 08/28/2013; pelvic CT -09/08/2013  MEDICATIONS: None.  COMPLICATIONS: None immediate  TECHNIQUE: Informed written consent was obtained from the patient after a discussion of the risks, benefits and alternatives to treatment. A timeout was performed prior to the initiation of the procedure.  Initial ultrasound scanning demonstrates a moderate amount of ascites within the right lower abdominal quadrant. The right lower abdomen was prepped and draped in the usual sterile fashion. 1% lidocaine with epinephrine was used for local anesthesia. Under direct ultrasound guidance, a 19 gauge, 7-cm, Yueh catheter was introduced. An ultrasound image was saved for documentation purposed. The paracentesis was performed. The catheter was removed and a dressing was applied. The patient tolerated the procedure well without immediate post procedural complication.  FINDINGS: A total of approximately 3.6 liters of serous, yellow tinged fluid was removed. Samples were sent to the laboratory as requested by  the clinical team.  IMPRESSION: Successful ultrasound-guided paracentesis yielding 3.6 liters of peritoneal fluid.   Electronically Signed   By: Simonne Come M.D.   On: 09/21/2013 12:24   Ct Pelvis Limited W/o Cm  09/08/2013   CLINICAL DATA:  Scrotal swelling. Right inguinal hernia repair. Incisional bleeding after right inguinal hernia repair. Postoperative complication due to coagulopathy and cirrhosis.  EXAM: CT PELVIS WITHOUT CONTRAST  TECHNIQUE: Contiguous axial CT images were obtained from the iliac crests through the proximal femurs to include the scrotum and entire pelvic contents. Multiplanar reformatting was performed in the coronal and sagittal planes. This is NOT a limited exam, and in fact is protocoled to extend well further in the upper legs than a normal pelvic CT.  COMPARISON:  08/28/2013  FINDINGS: Prominent pelvic ascites. Diffuse subcutaneous edema compatible with third spacing of fluid. Mesenteric edema. Sigmoid diverticulosis. Urinary bladder unremarkable.  Right external iliac lymph node short axis diameter 1.3 cm. Clustered additional iliac and inguinal lymph nodes are present bilaterally.  Faintly visible postoperative findings from inguinal hernia repair. Very minimally hyperdense fluid tracks along the inguinal rain and surrounds the right spermatic cord, extending down into the scrotum. I can't really see the inferior epigastric vessels all that well to be able to characterize the hernia further. The right spermatic cord itself appears edematous and expanded compared to the left. This fluid collection deviates the base of the penis to the left. Scrotal skin swelling compatible with the generalized third spacing of fluid.  IMPRESSION: 1. Slightly complex fluid collection tracks from the right inguinal ring around the edematous right spermatic cord and into the scrotum. Given that this large hydrocele measures slightly above the density of the extensive ascites in the pelvis, it may well  have some involving blood products. If there is concern for testicular torsion then scrotal Doppler would be recommended. 2. Extensive third spacing of fluid with prominent ascites, subcutaneous edema, mesenteric edema, and scrotal skin swelling. 3. Mildly enlarged external iliac and inguinal lymph nodes could be reactive, neoplastic, or due to passive lymphatic congestion.   Electronically Signed   By: Herbie Baltimore M.D.   On: 09/08/2013 14:02   Dg Chest Port 1 View  09/27/2013   CLINICAL DATA:  Status post thoracentesis  EXAM: PORTABLE CHEST - 1 VIEW  COMPARISON:  09/21/2013  FINDINGS: Large right pleural effusion. No left pleural effusion. No pneumothorax. Stable cardiomediastinal silhouette. Unremarkable osseous structures.  IMPRESSION: Large right pleural effusion.  No pneumothorax.   Electronically Signed   By: Elige Ko   On: 09/27/2013 19:07   Dg Chest Port 1 View  09/02/2013   CLINICAL DATA:  Shortness of breath  EXAM: PORTABLE CHEST - 1 VIEW  COMPARISON:  08/30/2013  FINDINGS: Prominent cardiomediastinal contours. Central vascular congestion. Perihilar interstitial and hazy airspace opacities. Left pleural effusion has increased and predominantly collects along the periphery of the left hemithorax. Associated airspace consolidation. No pneumothorax. No interval osseous change.  IMPRESSION: Increased central vascular congestion and perihilar interstitial and airspace opacities, favor pulmonary edema.  Increase left pleural effusion and associated airspace consolidation; atelectasis versus pneumonia.   Electronically Signed   By: Jearld Lesch M.D.   On: 09/02/2013 00:49   Dg Chest Port 1 View  08/30/2013   CLINICAL DATA:  Shortness of breath  EXAM: PORTABLE CHEST - 1 VIEW  COMPARISON:  None.  FINDINGS: Cardiac shadow is at the upper limits of normal in size. Increased density is noted in the left lung base consistent with the previously seen atelectasis and pleural effusion. The previously  seen right-sided pleural effusion is less well appreciated on this exam. No other focal abnormality is noted.  IMPRESSION: Left basilar atelectasis and pleural effusion.   Electronically Signed   By: Alcide Clever M.D.   On: 08/30/2013 08:17   Dg Abd Acute W/chest  09/21/2013   CLINICAL DATA:  History of hernia surgery 2 weeks ago at [facility]. Persistent infections since. Abdominal pain and distention. History of cirrhosis.  EXAM: ACUTE ABDOMEN SERIES (ABDOMEN 2 VIEW & CHEST 1 VIEW)  COMPARISON:  CT abdomen pelvis - 09/08/2013 ; CT abdomen pelvis - 08/28/2013 chest radiograph - 09/08/2013; 09/02/2013  FINDINGS: Grossly unchanged borderline enlarged cardiac silhouette and mediastinal contours. Interval increase and now likely moderate size right-sided effusion. Unchanged small left-sided effusion. Mild cephalization of flow without frank evidence of edema.  Paucity of bowel gas without definite evidence of obstruction. There is nonspecific mild gaseous distention of a rather featureless loop of small bowel within the mid abdomen measuring approximately 3.7 cm in diameter, nonspecific in the setting of patient's known ascites. There is a minimal amount of gas seen within the descending colon. No pneumoperitoneum, pneumatosis or portal venous gas.  No acute osseus abnormalities.  IMPRESSION: 1. Interval increase in suspected moderate sized right-sided effusion. 2. Unchanged small left-sided effusion. 3. Pulmonary venous congestion without frank evidence of edema. 4. Paucity of bowel gas without definite evidence of obstruction.   Electronically Signed   By: Simonne Come M.D.   On: 09/21/2013 09:02    Jeoffrey Massed, MD  [facility] Pager:[phone]  If 7PM-7AM, please contact night-coverage [email or web address] Password [email or web address] 09/28/2013, 2:17 PM   LOS: 1 day   **Disclaimer: This note may have been dictated with voice recognition software. Similar sounding words can inadvertently be transcribed and  this note may contain transcription errors which may not have been corrected upon publication of note.**

## 2013-09-28 NOTE — Evaluation (Addendum)
Physical Therapy Evaluation Patient Details Name: Xavier Valentine MRN: [id number] DOB: 18-Nov-1960 Today's Date: 09/28/2013   History of Present Illness  Xavier Valentine is a 53 y.o. male with a past medical history of liver cirrhosis thought to be secondary to alcohol as well as hepatitis C, recent admission for significant ascites, requiring paracentesis, recent inguinal hernia repair (at [facility]) with a wound infection and sepsis, recent healthcare associated pneumonia, thrombocytopenia due to his liver cirrhosis, coagulopathy due to his liver disease, who was discharged from the hospital on September 22.  He did well for about one or 2 days, but then started developing right-sided chest pain.  Relieved with paracentesis and no infection was cultured.  Clinical Impression  Pt was seen to assess his ability to manage safe gait for his trip home.  Has plan to travel that has been set aside, noting his difficulty with direction of safe choices on challenging surfaces.  Will expect he won't take an AD home but will see if it is needed tomorrow.      Follow Up Recommendations Home health PT;Supervision/Assistance - 24 hour    Equipment Recommendations  None recommended by PT    Recommendations for Other Services Other (comment)     Precautions / Restrictions Precautions Precautions: Fall Precaution Comments: Pt does not see that he could fall but does have minor gait changes Restrictions Weight Bearing Restrictions: No      Mobility  Bed Mobility Overal bed mobility: Modified Independent                Transfers Overall transfer level: Needs assistance Equipment used: None Transfers: Sit to/from [employer] Sit to Stand: Min assist Stand pivot transfers: Mod assist       General transfer comment: Impulsive at times with standing transfers, then sometimes needs to be reminded to initiate.  Ambulation/Gait Ambulation/Gait assistance: Min  guard Ambulation Distance (Feet): 300 Feet Assistive device: None Gait Pattern/deviations: Decreased dorsiflexion - right;Decreased dorsiflexion - left;Decreased step length - right;Decreased step length - left;Step-through pattern;Ataxic;Wide base of support;Drifts right/left (at times unsteady on LLE) Gait velocity: fast Gait velocity interpretation: at or above normal speed for age/gender General Gait Details: cues for safety and direction, requires reminders about the path for gait  Stairs Stairs: Yes Stairs assistance: Supervision Stair Management: One rail Right;One rail Left;Alternating pattern;Forwards Number of Stairs: 24 General stair comments: up then down 6 steps with no rails used, although PT  reminded ptabout  his need to use rails at home  Wheelchair Mobility    Modified Rankin (Stroke Patients Only)       Balance Overall balance assessment: Needs assistance Sitting-balance support: Feet supported Sitting balance-Leahy Scale: Good Sitting balance - Comments: Supervision for balance control with sitting on  edge of bed Postural control: Other (comment) Standing balance support: No upper extremity supported Standing balance-Leahy Scale: Poor Standing balance comment: pt insists he doesn't want to use AD, therefore is at a slightly greater fall risk                             Pertinent Vitals/Pain Pain Assessment: No/denies pain Pain Score: 0-No pain    Home Living Family/patient expects to be discharged to:: Private residence Living Arrangements: Alone;Other relatives Available Help at Discharge: Family Type of Home: House Home Access: Stairs to enter Entrance Stairs-Rails: Right;Left;Can reach both Entrance Stairs-Number of Steps: 5 Home Layout: One level Home Equipment: None Additional  Comments: Pt unreliable historian    Prior Function Level of Independence: Independent         Comments: Pt reports he is currently "homeless" but  moving in with his sister.     Hand Dominance        Extremity/Trunk Assessment   Upper Extremity Assessment: Overall WFL for tasks assessed           Lower Extremity Assessment: Generalized weakness      Cervical / Trunk Assessment: Normal  Communication   Communication: No difficulties  Cognition Arousal/Alertness: Awake/alert Behavior During Therapy: Impulsive Overall Cognitive Status: Difficult to assess       Memory:  (minor issues with recall of instructions)              General Comments General comments (skin integrity, edema, etc.): Pt is visibly cachectic and anxious, not looking comfortable with his situation  being unable to go to his own house.    Exercises        Assessment/Plan    PT Assessment Patient needs continued PT services  PT Diagnosis Difficulty walking   PT Problem List Decreased strength;Decreased range of motion;Decreased activity tolerance;Decreased mobility;Decreased cognition;Decreased safety awareness;Decreased skin integrity  PT Treatment Interventions DME instruction;Gait training;Stair training;Functional mobility training;Therapeutic activities;Therapeutic exercise;Patient/family education;Balance training;Neuromuscular re-education   PT Goals (Current goals can be found in the Care Plan section) Acute Rehab PT Goals Patient Stated Goal: agreeable to get up wtih therapy PT Goal Formulation: With patient Time For Goal Achievement: 10/06/13 Potential to Achieve Goals: Good    Frequency Min 3X/week   Barriers to discharge Decreased caregiver support going to stay with his sister to resolve the caregiver issue    Co-evaluation               End of Session Equipment Utilized During Treatment: Gait belt Activity Tolerance: Patient tolerated treatment well Patient left: in bed;with call bell/phone within reach;with nursing/sitter in room Nurse Communication: Mobility status    Functional Assessment Tool Used:  therapist's clinical judgment Functional Limitation: Mobility: Walking and moving around Mobility: Walking and Moving Around Current Status ([id number]): At least 20 percent but less than 40 percent impaired, limited or restricted Mobility: Walking and Moving Around Goal Status [phone]): At least 1 percent but less than 20 percent impaired, limited or restricted    Time: 0981-1914 PT Time Calculation (min): 24 min   Charges:   PT Evaluation $Initial PT Evaluation Tier I: 1 Procedure PT Treatments $Gait Training: 8-22 mins   PT G Codes:   Functional Assessment Tool Used: therapist's clinical judgment Functional Limitation: Mobility: Walking and moving around    Ivar Drape 09/28/2013, 7:07 PM Samul Dada, PT MS Acute Rehab Dept. Number: [phone]

## 2013-09-29 ENCOUNTER — Inpatient Hospital Stay (HOSPITAL_COMMUNITY): Payer: Medicaid - Out of State

## 2013-09-29 DIAGNOSIS — K7030 Alcoholic cirrhosis of liver without ascites: Secondary | ICD-10-CM

## 2013-09-29 DIAGNOSIS — K7682 Hepatic encephalopathy: Secondary | ICD-10-CM

## 2013-09-29 DIAGNOSIS — R188 Other ascites: Secondary | ICD-10-CM

## 2013-09-29 DIAGNOSIS — K7290 Hepatic failure, unspecified without coma: Secondary | ICD-10-CM

## 2013-09-29 LAB — OSMOLALITY: Osmolality: 261 mOsm/kg — ABNORMAL LOW (ref 275–300)

## 2013-09-29 LAB — BASIC METABOLIC PANEL
ANION GAP: 10 (ref 5–15)
Anion gap: 10 (ref 5–15)
Anion gap: 9 (ref 5–15)
Anion gap: 11 (ref 5–15)
BUN: 19 mg/dL (ref 6–23)
BUN: 18 mg/dL (ref 6–23)
BUN: 18 mg/dL (ref 6–23)
BUN: 19 mg/dL (ref 6–23)
CALCIUM: 7.9 mg/dL — AB (ref 8.4–10.5)
CALCIUM: 8.4 mg/dL (ref 8.4–10.5)
CALCIUM: 7.6 mg/dL — AB (ref 8.4–10.5)
CO2: 24 meq/L (ref 19–32)
CO2: 27 meq/L (ref 19–32)
CO2: 30 meq/L (ref 19–32)
CO2: 24 meq/L (ref 19–32)
CREATININE: 0.74 mg/dL (ref 0.50–1.35)
Calcium: 7.8 mg/dL — ABNORMAL LOW (ref 8.4–10.5)
Chloride: 85 mEq/L — ABNORMAL LOW (ref 96–112)
Chloride: 83 mEq/L — ABNORMAL LOW (ref 96–112)
Chloride: 84 mEq/L — ABNORMAL LOW (ref 96–112)
Chloride: 84 mEq/L — ABNORMAL LOW (ref 96–112)
Creatinine, Ser: 0.76 mg/dL (ref 0.50–1.35)
Creatinine, Ser: 0.77 mg/dL (ref 0.50–1.35)
Creatinine, Ser: 0.77 mg/dL (ref 0.50–1.35)
GFR calc Af Amer: >90 mL/min (ref >90)
GFR calc Af Amer: >90 mL/min (ref >90)
GFR calc Af Amer: >90 mL/min (ref >90)
GFR calc non Af Amer: >90 mL/min (ref >90)
GFR calc non Af Amer: >90 mL/min (ref >90)
GLUCOSE: 100 mg/dL — AB (ref 70–99)
GLUCOSE: 88 mg/dL (ref 70–99)
Glucose, Bld: 84 mg/dL (ref 70–99)
Glucose, Bld: 121 mg/dL — ABNORMAL HIGH (ref 70–99)
Potassium: 4.2 mEq/L (ref 3.7–5.3)
Potassium: 4.3 mEq/L (ref 3.7–5.3)
Potassium: 4.2 mEq/L (ref 3.7–5.3)
Potassium: 4.1 mEq/L (ref 3.7–5.3)
SODIUM: 119 meq/L — AB (ref 137–147)
SODIUM: 120 meq/L — AB (ref 137–147)
Sodium: 123 mEq/L — ABNORMAL LOW (ref 137–147)
Sodium: 119 mEq/L — CL (ref 137–147)

## 2013-09-29 LAB — PATHOLOGIST SMEAR REVIEW

## 2013-09-29 LAB — SODIUM, URINE, RANDOM: Sodium, Ur: 52 mEq/L

## 2013-09-29 MED ORDER — FUROSEMIDE 10 MG/ML IJ SOLN
40.0000 mg | Freq: Three times a day (TID) | INTRAMUSCULAR | Status: DC
Start: 2013-09-29 — End: 2013-09-30
  Administered 2013-09-29 – 2013-09-30 (×3): 40 mg via INTRAVENOUS
  Filled 2013-09-29 (×3): qty 4

## 2013-09-29 NOTE — Plan of Care (Signed)
Problem: Food- and Nutrition-Related Knowledge Deficit (NB-1.1) Goal: Nutrition education Formal process to instruct or train a patient/client in a skill or to impart knowledge to help patients/clients voluntarily manage or modify food choices and eating behavior to maintain or improve health. Outcome: Completed/Met Date Met:  09/29/13 Nutrition Education Note  RD consulted for nutrition education regarding liver disease.  RD provided "Liver Disease Nutrition Therapy" handout from the Academy [employer]. Reviewed patient's dietary recall. Provided examples on ways to decrease sodium intake in diet. Discouraged intake of processed foods and use of salt shaker. Encouraged intake of high-protein foods and drinks to supplement diet. Pt was given ideas on recipes and condiments to use. Encouraged pt to wash raw fruits and vegetables before consumption.  RD discussed why it is important for patient to adhere to diet recommendations, and emphasized the role of fluids, foods to avoid, and importance of weighing self daily. Teach back method used.  Expect good compliance.  Body mass index is 24.79 kg/(m^2). Pt meets criteria for normal body weight based on current BMI.  Current diet order is 2 g Na, patient is consuming approximately 100% of meals at this time. Labs and medications reviewed. No further nutrition interventions warranted at this time. RD contact information provided. If additional nutrition issues arise, please re-consult RD.   Terrace Arabia RD, LDN

## 2013-09-29 NOTE — Telephone Encounter (Signed)
Pt is currently in hospital. We will check on status post discharge

## 2013-09-29 NOTE — Consult Note (Signed)
I have seen and examined this patient and agree with the plan of care  [facility] W 09/29/2013, 8:33 PM

## 2013-09-29 NOTE — Evaluation (Signed)
Occupational Therapy Evaluation and Discharge Patient Details Name: ROARK RUFO MRN: [id number] DOB: 02-27-1960 Today's Date: 09/29/2013    History of Present Illness Right-sided chest pain or shortness of breath and cough. 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 Reported that he ran out of his diuretics.   Clinical Impression   This 53 yo male admitted with above presents to acute OT at an Independent level, acute OT will sign off.     Follow Up Recommendations  No OT follow up          Precautions / Restrictions Precautions Precautions: None Restrictions Weight Bearing Restrictions: No      Mobility Bed Mobility Overal bed mobility: Independent                Transfers Overall transfer level: Independent                         ADL Overall ADL's : Independent                                                       Pertinent Vitals/Pain Pain Assessment: 0-10 Pain Score: 4  Pain Location: right lateral rib cage Pain Descriptors / Indicators: Sharp (when coughs) Pain Intervention(s): Monitored during session     Hand Dominance Right   Extremity/Trunk Assessment Upper Extremity Assessment Upper Extremity Assessment: Overall WFL for tasks assessed           Communication Communication Communication: No difficulties   Cognition Arousal/Alertness: Awake/alert Behavior During Therapy: WFL for tasks assessed/performed Overall Cognitive Status: Within Functional Limits for tasks assessed                                 Home Living Family/patient expects to be discharged to:: Private residence Living Arrangements:  (currently living with sister, but plans to go on a road trip with a friend as soon as he can) Available Help at Discharge: Family Type of Home: House Home Access: Stairs to enter Secretary/administrator of Steps: 5 Entrance Stairs-Rails: Right;Left;Can reach both Home Layout: One level     Bathroom Shower/Tub: Tub/shower unit Shower/tub characteristics: Engineer, building services: Standard     Home Equipment: None          Prior Functioning/Environment Level of Independence: Independent                      OT Goals(Current goals can be found in the care plan section) Acute Rehab OT Goals Patient Stated Goal: hopes to go on road trip with his friend soon  OT Frequency:                End of Session Equipment Utilized During Treatment:  (none)  Activity Tolerance: Patient tolerated treatment well Patient left: in bed   Time: 4098-1191 OT Time Calculation (min): 14 min Charges:  OT General Charges $OT Visit: 1 Procedure OT  Evaluation $Initial OT Evaluation Tier I: 1 Procedure OT Treatments $Self Care/Home Management : 8-22 mins  Evette Georges [phone] 09/29/2013, 3:17 PM

## 2013-09-29 NOTE — Consult Note (Signed)
Reason for Consult: Hyponatremia Referring Physician: Dr. Oren Binet HPI:  Xavier Valentine is an 53 y.o. male w/ PMHx of Hep C, Cirrhosis w/ ascites (Child-Pugh Class C), thrombocytopenia, and coagulopathy, admitted for a symptomatic, large right-sided pleural effusion. The patient was recently admitted at [facility] from 9/20-9/22 for ascites in which he had ~4L drained, not significant for SBP, most likely 2/2 diuretic non-compliance. The patient claims that he has had some SOB and cough ever since his discharge which got progressively worse over the past couple days. He states he first found out about his liver disease earlier in the year prior to a recent inguinal hernia repair about 2 months ago. He does state that he was told he had Hepatitis C about 20 years ago. He claims he has been drinking alcohol on and off since he was 53 years old (vodka mostly), but says that he has not drank for about 2 months. He denies any recent severe diarrhea or vomiting. The patient denies any h/o hematemesis, hematochezia, or melena. He states he has never previously had an issue w/ ascites in the past and has never had fluid drained from his abdomen prior to 09/21/13. He does admit to some recent confusion and disorientation prior to coming to the hospital.   PMH:   Past Medical History  Diagnosis Date  . Alcoholism     chronic.   . Inguinal hernia     repaired in [place] 08/2013  . Hepatitis C 2000  . Perirectal abscess   . Cirrhosis 08/2013    ETOH and Hep C  . Ascites 09/2013  . Thrombocytopenia 09/2013  . Coagulopathy 09/2013    due to hepatic dysfunction.     PSH:   Past Surgical History  Procedure Laterality Date  . Hernia repair  08/2013    at [facility] in [place].     Allergies:  Allergies  Allergen Reactions  . Bee Venom Swelling    Medications:   Prior to Admission medications   Medication Sig Start Date End Date Taking? Authorizing Provider  albuterol (PROVENTIL  HFA;VENTOLIN HFA) 108 (90 BASE) MCG/ACT inhaler Inhale 2 puffs into the lungs every 6 (six) hours as needed for wheezing or shortness of breath.   Yes Historical Provider, MD  docusate sodium (COLACE) 100 MG capsule Take 100 mg by mouth 2 (two) times daily as needed for mild constipation.   Yes Historical Provider, MD  folic acid (FOLVITE) 1 MG tablet Take 1 tablet (1 mg total) by mouth daily. 09/09/13  Yes Verlee Monte, MD  furosemide (LASIX) 40 MG tablet Take 40 mg by mouth daily. 09/09/13  Yes Verlee Monte, MD  lactulose (CHRONULAC) 10 GM/15ML solution Take 15 mLs (10 g total) by mouth daily. 09/23/13  Yes Marianne L York, PA-C  Multiple Vitamin (MULTIVITAMIN WITH MINERALS) TABS tablet Take 1 tablet by mouth daily. 09/09/13  Yes Verlee Monte, MD  neomycin-bacitracin-polymyxin (NEOSPORIN) OINT Apply 1 application topically as needed for wound care. 09/23/13  Yes Bobby Rumpf York, PA-C  nystatin ointment (MYCOSTATIN) Apply topically 2 (two) times daily. 09/23/13  Yes Bobby Rumpf York, PA-C  oxyCODONE (ROXICODONE) 5 MG immediate release tablet Take 1 tablet (5 mg total) by mouth every 4 (four) hours as needed for severe pain. 09/23/13  Yes Bobby Rumpf York, PA-C  spironolactone (ALDACTONE) 100 MG tablet Take 1 tablet (100 mg total) by mouth daily. 09/23/13  Yes Bobby Rumpf York, PA-C  tamsulosin (FLOMAX) 0.4 MG CAPS capsule Take 1 capsule (  0.4 mg total) by mouth daily. 09/23/13  Yes Bobby Rumpf York, PA-C  thiamine 100 MG tablet Take 1 tablet (100 mg total) by mouth daily. 09/09/13  Yes Verlee Monte, MD    Discontinued Meds:   Medications Discontinued During This Encounter  Medication Reason  . acetaminophen-codeine (TYLENOL #3) 300-30 MG per tablet 2 tablet   . vancomycin (VANCOCIN) IVPB 1000 mg/200 mL premix   . lactulose (CHRONULAC) 10 GM/15ML solution 10 g   . piperacillin-tazobactam (ZOSYN) IVPB 3.375 g   . vancomycin (VANCOCIN) IVPB 750 mg/150 ml premix   . furosemide (LASIX) injection 40 mg        Family History:   Family History  Problem Relation Age of Onset  . CAD    . Hypertension    . Diabetes Mother   . Hypertension Mother   . Cancer Father   . Cancer Maternal Grandfather     Social History:  reports that he quit smoking about 5 years ago. His smoking use included Cigarettes. He smoked 0.00 packs per day. He has never used smokeless tobacco. He reports that he uses illicit drugs (Marijuana). He reports that he does not drink alcohol.  Review of Systems  Constitutional: Positive for malaise/fatigue. Negative for fever, chills, weight loss and diaphoresis.  HENT: Negative for congestion and nosebleeds.   Eyes: Negative for blurred vision.  Respiratory: Positive for cough and shortness of breath. Negative for hemoptysis, sputum production and wheezing.   Cardiovascular: Positive for chest pain and leg swelling. Negative for palpitations, orthopnea, claudication and PND.  Gastrointestinal: Negative for heartburn, nausea, vomiting, abdominal pain, diarrhea, constipation, blood in stool and melena.  Genitourinary: Negative for dysuria, urgency, frequency and hematuria.  Skin: Negative for itching and rash.  Neurological: Positive for weakness. Negative for dizziness, tingling, tremors, seizures, loss of consciousness and headaches.  Endo/Heme/Allergies: Does not bruise/bleed easily.  Psychiatric/Behavioral: Positive for substance abuse (alcohol, remote h/o cocaine abuse).    Blood pressure 111/64, pulse 78, temperature 98.2 F (36.8 C), temperature source Oral, resp. rate 18, height 5' 11"  (1.803 m), weight 177 lb 11.1 oz (80.6 kg), SpO2 94.00%.  Physical Exam  General: Thin appearing male, slightly jaundiced, figety, alert, cooperative, NAD. HEENT: PERRL, EOMI. Mild icterus. Moist mucus membranes. Neck: Full range of motion without pain, supple, no lymphadenopathy or carotid bruits Lungs: Decreased breath sounds in right lung field. Otherwise clear to  auscultation. No wheezes, crackles, or rales.  Heart: RRR, no murmurs, gallops, or rubs Abdomen: Soft, distended, fluid wave, dull to percussion at the flanks. Bowel sounds present.  Extremities: No cyanosis or clubbing. Thin extremities, trace pitting edema in LE's.  Neurologic: Alert & oriented x3, cranial nerves II-XII intact, strength grossly intact, sensation intact to light touch. Tangential speech.    Creatinine, Ser  Date/Time Value Ref Range Status  09/29/2013  9:06 AM 0.77  0.50 - 1.35 mg/dL Final  09/29/2013  6:55 AM 0.76  0.50 - 1.35 mg/dL Final  09/28/2013  6:26 AM 0.91  0.50 - 1.35 mg/dL Final  09/27/2013  5:21 AM 0.76  0.50 - 1.35 mg/dL Final  09/23/2013  8:25 AM 0.73  0.50 - 1.35 mg/dL Final  09/22/2013 11:39 AM 0.82  0.50 - 1.35 mg/dL Final  09/21/2013  8:10 AM 0.84  0.50 - 1.35 mg/dL Final  09/12/2013  6:29 PM 1.07  0.50 - 1.35 mg/dL Final  09/10/2013  5:00 AM 1.06  0.50 - 1.35 mg/dL Final  09/09/2013  4:31 AM 1.09  0.50 -  1.35 mg/dL Final  09/08/2013  4:36 AM 1.01  0.50 - 1.35 mg/dL Final  09/07/2013  5:20 AM 0.99  0.50 - 1.35 mg/dL Final  09/06/2013  4:48 AM 0.97  0.50 - 1.35 mg/dL Final  09/05/2013  3:35 AM 1.09  0.50 - 1.35 mg/dL Final  09/04/2013  3:33 AM 1.08  0.50 - 1.35 mg/dL Final     DELTA CHECK NOTED  09/03/2013  2:45 AM 0.70  0.50 - 1.35 mg/dL Final  09/02/2013  3:33 AM 0.76  0.50 - 1.35 mg/dL Final  09/01/2013  3:49 AM 0.80  0.50 - 1.35 mg/dL Final  08/31/2013  3:34 AM 0.76  0.50 - 1.35 mg/dL Final  08/30/2013  7:45 AM 0.92  0.50 - 1.35 mg/dL Final  08/29/2013  4:20 AM 0.77  0.50 - 1.35 mg/dL Final  08/28/2013  1:57 PM 0.76  0.50 - 1.35 mg/dL Final    Results for orders placed during the hospital encounter of 09/27/13 (from the past 48 hour(s))  LACTATE DEHYDROGENASE, BODY FLUID     Status: Abnormal   Collection Time    09/27/13  6:22 PM      Result Value Ref Range   LD, Fluid 87 (*) 3 - 23 U/L   Fluid Type-FLDH PLEURAL     Comment: RIGHT     FLUID     CORRECTED ON 09/26 AT  1837: PREVIOUSLY REPORTED AS Pleural R  PROTEIN, BODY FLUID     Status: None   Collection Time    09/27/13  6:22 PM      Result Value Ref Range   Total protein, fluid 1.6     Comment: NO NORMAL RANGE ESTABLISHED FOR THIS TEST   Fluid Type-FTP PLEURAL     Comment: RIGHT     FLUID     CORRECTED ON 09/26 AT 1836: PREVIOUSLY REPORTED AS Pleural R  BODY FLUID CELL COUNT WITH DIFFERENTIAL     Status: Abnormal   Collection Time    09/27/13  6:22 PM      Result Value Ref Range   Fluid Type-FCT PLEURAL     Comment: RIGHT     FLUID     CORRECTED ON 09/26 AT 1837: PREVIOUSLY REPORTED AS Body Fluid   Color, Fluid AMBER     Appearance, Fluid CLOUDY (*) CLEAR   WBC, Fluid 717  0 - 1000 cu mm   Neutrophil Count, Fluid 6  0 - 25 %   Lymphs, Fluid 52     Monocyte-Macrophage-Serous Fluid 42 (*) 50 - 90 %   Other Cells, Fluid MESOTHELIAL CELLS PRESENT ON SMEAR    BODY FLUID CULTURE     Status: None   Collection Time    09/27/13  6:25 PM      Result Value Ref Range   Specimen Description PLEURAL RIGHT FLUID     Special Requests NONE     Gram Stain       Value: FEW WBC PRESENT, PREDOMINANTLY MONONUCLEAR     NO ORGANISMS SEEN     Performed at [facility]   Culture       Value: NO GROWTH 1 DAY     Performed at [facility]   Report Status PENDING    ALBUMIN, FLUID     Status: None   Collection Time    09/27/13  6:25 PM      Result Value Ref Range   Albumin, Fluid 0.7     Comment: NO NORMAL RANGE  ESTABLISHED FOR THIS TEST   Fluid Type-FALB PLEURAL     Comment: FLUID     CORRECTED ON 09/26 AT 1835: PREVIOUSLY REPORTED AS PLEURAL  GLUCOSE, SEROUS FLUID     Status: None   Collection Time    09/27/13  6:25 PM      Result Value Ref Range   Glucose, Fluid 138     Comment:            FLUID GLUCOSE LEVELS OF <60     mg/dL OR VALUES OF 40 mg/dL     LESS THAN A SIMULTANEOUS     SERUM LEVEL ARE CONSIDERED     DECREASED.   Fluid Type-FGLU PLEURAL     Comment: RIGHT      FLUID     CORRECTED ON 09/26 AT 1942: PREVIOUSLY REPORTED AS PLEURAL  LACTATE DEHYDROGENASE     Status: Abnormal   Collection Time    09/27/13  7:25 PM      Result Value Ref Range   LDH 311 (*) 94 - 250 U/L   Comment: HEMOLYSIS AT THIS LEVEL MAY AFFECT RESULT  PROTEIN, TOTAL     Status: None   Collection Time    09/27/13  7:25 PM      Result Value Ref Range   Total Protein 6.7  6.0 - 8.3 g/dL  AMMONIA     Status: None   Collection Time    09/28/13  6:26 AM      Result Value Ref Range   Ammonia 58  11 - 60 umol/L  COMPREHENSIVE METABOLIC PANEL     Status: Abnormal   Collection Time    09/28/13  6:26 AM      Result Value Ref Range   Sodium 127 (*) 137 - 147 mEq/L   Potassium 4.3  3.7 - 5.3 mEq/L   Chloride 90 (*) 96 - 112 mEq/L   CO2 26  19 - 32 mEq/L   Glucose, Bld 86  70 - 99 mg/dL   BUN 15  6 - 23 mg/dL   Creatinine, Ser 0.91  0.50 - 1.35 mg/dL   Calcium 7.8 (*) 8.4 - 10.5 mg/dL   Total Protein 6.6  6.0 - 8.3 g/dL   Albumin 1.9 (*) 3.5 - 5.2 g/dL   AST 43 (*) 0 - 37 U/L   ALT 28  0 - 53 U/L   Alkaline Phosphatase 69  39 - 117 U/L   Total Bilirubin 3.9 (*) 0.3 - 1.2 mg/dL   GFR calc non Af Amer >90  >90 mL/min   GFR calc Af Amer >90  >90 mL/min   Comment: (NOTE)     The eGFR has been calculated using the CKD EPI equation.     This calculation has not been validated in all clinical situations.     eGFR's persistently <90 mL/min signify possible Chronic Kidney     Disease.   Anion gap 11  5 - 15  CBC     Status: Abnormal   Collection Time    09/28/13  6:26 AM      Result Value Ref Range   WBC 10.8 (*) 4.0 - 10.5 K/uL   RBC 3.41 (*) 4.22 - 5.81 MIL/uL   Hemoglobin 10.8 (*) 13.0 - 17.0 g/dL   HCT 30.7 (*) 39.0 - 52.0 %   MCV 90.0  78.0 - 100.0 fL   MCH 31.7  26.0 - 34.0 pg   MCHC 35.2  30.0 - 36.0 g/dL  RDW 14.6  11.5 - 15.5 %   Platelets 74 (*) 150 - 400 K/uL   Comment: CONSISTENT WITH PREVIOUS RESULT  HEPATITIS PANEL, ACUTE     Status: Abnormal   Collection  Time    09/28/13  6:26 AM      Result Value Ref Range   Hepatitis B Surface Ag NEGATIVE  NEGATIVE   HCV Ab Reactive (*) NEGATIVE   Comment: (NOTE)                                                                               This test is for screening purposes only.  Reactive results should be     confirmed by an alternative method.  Suggest HCV Qualitative, PCR,     test code 83130.  Specimens will be stable for reflex testing up to 3     days after collection.   Hep A IgM NON REACTIVE  NON REACTIVE   Comment: (NOTE)     Effective November 17, 2013, Hepatitis Acute Panel (test code [phone])     will be revised to automatically reflex to the Hepatitis C Viral RNA,     Quantitative, Real-Time PCR assay if the Hepatitis C antibody     screening result is Reactive. This action is being taken to ensure     that the CDC/USPSTF recommended HCV diagnostic algorithm with the     appropriate test reflex needed for accurate interpretation is     followed.   Hep B C IgM NON REACTIVE  NON REACTIVE   Comment: (NOTE)     High levels of Hepatitis B Core IgM antibody are detectable     during the acute stage of Hepatitis B. This antibody is used     to differentiate current from past HBV infection.     Performed at [facility]     Status: Abnormal   Collection Time    09/29/13  6:55 AM      Result Value Ref Range   Sodium 119 (*) 137 - 147 mEq/L   Comment: CRITICAL RESULT CALLED TO, READ BACK BY AND VERIFIED WITH:     Mordecai Rasmussen RN 09/29/13 0848 COSTELLO B   Potassium 4.2  3.7 - 5.3 mEq/L   Chloride 85 (*) 96 - 112 mEq/L   CO2 24  19 - 32 mEq/L   Glucose, Bld 84  70 - 99 mg/dL   BUN 19  6 - 23 mg/dL   Creatinine, Ser 0.76  0.50 - 1.35 mg/dL   Calcium 7.8 (*) 8.4 - 10.5 mg/dL   GFR calc non Af Amer >90  >90 mL/min   GFR calc Af Amer >90  >90 mL/min   Comment: (NOTE)     The eGFR has been calculated using the CKD EPI equation.     This calculation has not been  validated in all clinical situations.     eGFR's persistently <90 mL/min signify possible Chronic Kidney     Disease.   Anion gap 10  5 - 15  BASIC METABOLIC PANEL     Status: Abnormal   Collection Time    09/29/13  9:06 AM  Result Value Ref Range   Sodium 120 (*) 137 - 147 mEq/L   Comment: CRITICAL RESULT CALLED TO, READ BACK BY AND VERIFIED WITH:     Mordecai Rasmussen RN 09/29/13 0953 COSTELLO B   Potassium 4.3  3.7 - 5.3 mEq/L   Chloride 83 (*) 96 - 112 mEq/L   CO2 27  19 - 32 mEq/L   Glucose, Bld 121 (*) 70 - 99 mg/dL   BUN 18  6 - 23 mg/dL   Creatinine, Ser 0.77  0.50 - 1.35 mg/dL   Calcium 7.9 (*) 8.4 - 10.5 mg/dL   GFR calc non Af Amer >90  >90 mL/min   GFR calc Af Amer >90  >90 mL/min   Comment: (NOTE)     The eGFR has been calculated using the CKD EPI equation.     This calculation has not been validated in all clinical situations.     eGFR's persistently <90 mL/min signify possible Chronic Kidney     Disease.   Anion gap 10  5 - 15    Dg Chest 2 View  09/29/2013   CLINICAL DATA:  Right pleural effusion  EXAM: CHEST  2 VIEW  COMPARISON:  09/27/2013  FINDINGS: There is a large right pleural effusion unchanged from the prior exam. There is no left pleural effusion. There is no focal parenchymal opacity. There is no pneumothorax. The heart and mediastinal contours are unremarkable.  The osseous structures are unremarkable.  IMPRESSION: Large right pleural effusion unchanged from 09/27/2013.   Electronically Signed   By: Kathreen Devoid   On: 09/29/2013 13:17   Dg Chest Port 1 View  09/27/2013   CLINICAL DATA:  Status post thoracentesis  EXAM: PORTABLE CHEST - 1 VIEW  COMPARISON:  09/21/2013  FINDINGS: Large right pleural effusion. No left pleural effusion. No pneumothorax. Stable cardiomediastinal silhouette. Unremarkable osseous structures.  IMPRESSION: Large right pleural effusion.  No pneumothorax.   Electronically Signed   By: Kathreen Devoid   On: 09/27/2013 19:07      Assessment/Plan: 53 y/o M w/ Cirrhosis (Child-Pugh Class C), 2/2 Hep C and alcohol abuse, thrombocytopenia, coagulopathy, and hyponatremia, admitted for right-sided pleural effusion, found to have worsening hyponatremia.   Acute on Chronic Hyponatremia- No labs prior to 08/2013, however, noted to have baseline Na of 125-130. Most recently, found to worsen to as low as 119. Chronic hyponatremia most likely 2/2 liver disease w/ acute worsening possibly related to spironolactone (patient previously non-compliant) vs recent thoracentesis. Will hold spirolactone for now. Continue Lasix. Serum osmolality and urine sodium pending, fluid restriction. Do not suspect SIADH after reviewing medications. Decreased solute intake (beer potomania vs poor diet) also less likely given patient's recent clinical history.  Cirrhosis- Given patient's thromobocytopenia, coagulopathy, and significant ascites, Patient is classified as Child's Pugh class C (reflecting 45% 1 year survival, 35% 2 year survival), and w/ a Meld score of 20.25 (76% 3 month mortality).  Thrombocytopenia- 2/2 liver disease. No signs of bleeding currently. Continue to monitor.  Coagulopathy- INR 2.17 on admission, given Vit K.  Alcohol Abuse- States he has not had alcohol in 2 months. May exhibit some mild dementia?. On Thiamine/Folate.    Luanne Bras 09/29/2013, 2:16 PM

## 2013-09-29 NOTE — Progress Notes (Signed)
PATIENT DETAILS Name: Xavier Valentine Age: 53 y.o. Sex: male Date of Birth: Sep 15, 1960 Admit Date: 09/27/2013 Admitting Physician Osvaldo Shipper, MD [id number], Ayesha Rumpf, MD  Subjective: Complains of back pain from "split rib"  Assessment/Plan: Principal Problem:   Pleural effusion on right -secondary to liver cirrhoses-pleural fluid consistent with transudate by Light's criteria -optimize diuretic regimen. No indication for infection-will stop IV Abx -S/P thoracentesis 1400 ml drawn off on 9/26 by pulmonary.  Active Problems: Hyponatremia -secondary to Cirrhoses.  Appears to be worsening at 119 (9/28) repeat bmet this am. -increased  IV Lasix to TID, started fluid restriction of 800 ml. - continue  Aldactone-montor Lytes-recheck at 3 pm  Liver Cirrhoses with coagulopathy, thrombocytopenia, ascites-secondary to Hep C and ETOH -change lactulose to 30 mg BID,c/w IV Lasix, Aldactone -claims no drink/ETOH since early Sept -has outpatient appt with GI next month    Coagulopathy -secondary to above -monitor INR periodically    Thrombocytopenia -stable -monitor CBC periodically (decreasing at 74 on 9/28)  Recent Right Hernia Repair -wound/incision site-with no evidence of infection  Alcohol use -claims no drink/Etoh since early Sept-counseled extensively -no signs of withdrawal  Hx of Cocaine Abuse -counseled extensively-claims last use was in Early sept  Disposition: Remain inpatient  DVT Prophylaxis: SCD's  Code Status: Full code  Family Communication None at bedside  Procedures:  None  CONSULTS:  PCCM  Time spent 40 minutes-which includes 50% of the time with face-to-face with patient/ family and coordinating care related to the above assessment and plan.  MEDICATIONS: Scheduled Meds: . folic acid  1 mg Oral Daily  . furosemide  40 mg Intravenous TID  . lactulose  30 g Oral BID  . multivitamin with minerals  1 tablet Oral Daily  .  neomycin-bacitracin-polymyxin  1 application Topical TID  . phytonadione (VITAMIN K) IV  10 mg Intravenous Daily  . sodium chloride  3 mL Intravenous Q12H  . spironolactone  100 mg Oral Daily  . tamsulosin  0.4 mg Oral Daily  . thiamine  100 mg Oral Daily   Continuous Infusions:  PRN Meds:.sodium chloride, acetaminophen, acetaminophen, albuterol, docusate sodium, morphine injection, ondansetron (ZOFRAN) IV, ondansetron, oxyCODONE, sodium chloride  Antibiotics: Anti-infectives   Start     Dose/Rate Route Frequency Ordered Stop   09/27/13 0830  vancomycin (VANCOCIN) IVPB 1000 mg/200 mL premix  Status:  Discontinued     1,000 mg 200 mL/hr over 60 Minutes Intravenous Every 8 hours 09/27/13 0823 09/27/13 0827   09/27/13 0830  piperacillin-tazobactam (ZOSYN) IVPB 3.375 g  Status:  Discontinued     3.375 g 12.5 mL/hr over 240 Minutes Intravenous 3 times per day 09/27/13 0823 09/28/13 1444   09/27/13 0830  vancomycin (VANCOCIN) IVPB 750 mg/150 ml premix  Status:  Discontinued     750 mg 150 mL/hr over 60 Minutes Intravenous Every 8 hours 09/27/13 0827 09/28/13 1444       PHYSICAL EXAM: Vital signs in last 24 hours: Filed Vitals:   09/28/13 1322 09/28/13 2150 09/29/13 0500 09/29/13 0618  BP: 124/72 145/70  102/61  Pulse: 80 87  70  Temp: 98.2 F (36.8 C) 97.5 F (36.4 C)  97.7 F (36.5 C)  TempSrc: Oral Oral  Oral  Resp: Height:      Weight:   80.6 kg (177 lb 11.1 oz)   SpO2: 98% 96%  94%    Weight change: 1.493 kg (3 lb 4.7 oz)  Filed Weights   09/27/13 0835 09/28/13 0603 09/29/13 0500  Weight: 79.107 kg (174 lb 6.4 oz) 80.6 kg (177 lb 11.1 oz) 80.6 kg (177 lb 11.1 oz)   Body mass index is 24.79 kg/(m^2).   Gen Exam: Awake and alert with clear speech.  Pleasant. Neck: Supple, No JVD.   Chest: B/L Clear-still with decreased air entry on right>left base.  Bandage on back clean and dry. CVS: S1 S2 Regular, no murmurs.  Abdomen: soft, BS +, non tender,mildly  distended-not at all tense-but dull flanks on percussion Extremities: no edema, lower extremities warm to touch. Neurologic: Non Focal.     Intake/Output from previous day:  Intake/Output Summary (Last 24 hours) at 09/29/13 0859 Last data filed at 09/28/13 2200  Gross per 24 hour  Intake     60 ml  Output      0 ml  Net     60 ml     LAB RESULTS: CBC  Recent Labs Lab 09/22/13 1139 09/23/13 0825 09/27/13 0521 09/28/13 0626  WBC 7.0 6.0 9.4 10.8*  HGB 12.3* 11.0* 11.7* 10.8*  HCT 36.5* 31.4* 33.9* 30.7*  PLT 116* 100* 93* 74*  MCV 97.6 93.7 93.4 90.0  MCH 32.9 32.8 32.2 31.7  MCHC 33.7 35.0 34.5 35.2  RDW 15.1 14.9 14.9 14.6  LYMPHSABS  --   --  1.5  --   MONOABS  --   --  0.9  --   EOSABS  --   --  0.2  --   BASOSABS  --   --  0.0  --     Chemistries   Recent Labs Lab 09/22/13 1139 09/23/13 0825 09/26/13 0959 09/27/13 0521 09/28/13 0626 09/29/13 0655  NA 137 134*  --  129* 127* 119*  K 3.2* 3.8 4.1 4.4 4.3 4.2  CL 101 100  --  95* 90* 85*  CO2 24 22  --  GLUCOSE 94 89  --  107* 86 84  BUN 10 11  --  CREATININE 0.82 0.73  --  0.76 0.91 0.76  CALCIUM 8.0* 8.1*  --  8.5 7.8* 7.8*    CBG: No results found for this basename: GLUCAP,  in the last 168 hours  GFR Estimated Creatinine Clearance: 111.1 ml/min (by C-G formula based on Cr of 0.76).  Coagulation profile  Recent Labs Lab 09/22/13 1139 09/27/13 0521  INR 2.40* 2.17*    Recent Labs  09/26/13 0959  HGBA1C 4.5    Recent Labs  09/26/13 0959  TSH 4.437    MICROBIOLOGY: Recent Results (from the past 240 hour(s))  GRAM STAIN     Status: None   Collection Time    09/21/13 11:38 AM      Result Value Ref Range Status   Specimen Description PERITONEAL CAVITY   Final   Special Requests NONE   Final   Gram Stain     Final   Value: CYTOSPIN SLIDE     WBC PRESENT,BOTH PMN AND MONONUCLEAR     NO ORGANISMS SEEN   Report Status 09/21/2013 FINAL   Final  BODY FLUID  CULTURE     Status: None   Collection Time    09/21/13 11:38 AM      Result Value Ref Range Status   Specimen Description PERITONEAL CAVITY   Final   Special Requests NONE   Final   Gram Stain     Final   Value: CYTOSPIN WBC  PRESENT,BOTH PMN AND MONONUCLEAR     NO ORGANISMS SEEN     Performed at [facility]     Performed at [facility]   Culture     Final   Value: NO GROWTH 3 DAYS     Performed at [facility]   Report Status 09/25/2013 FINAL   Final  BODY FLUID CULTURE     Status: None   Collection Time    09/27/13  6:25 PM      Result Value Ref Range Status   Specimen Description PLEURAL RIGHT FLUID   Final   Special Requests NONE   Final   Gram Stain     Final   Value: FEW WBC PRESENT, PREDOMINANTLY MONONUCLEAR     NO ORGANISMS SEEN     Performed at [facility]   Culture     Final   Value: NO GROWTH     Performed at [facility]   Report Status PENDING   Incomplete    RADIOLOGY STUDIES/RESULTS: Dg Chest 2 View  09/27/2013   CLINICAL DATA:  Cough, congestion, fever, shortness of breath, right-sided chest pain. Pneumonia last week.  EXAM: CHEST  2 VIEW  COMPARISON:  09/21/2013  FINDINGS: Significant increase in right pleural effusion and basilar atelectasis/ consolidation since previous study. Now also blunting of left costophrenic angle. Normal heart size and pulmonary vascularity. Left lung is clear.  IMPRESSION: Increasing size of right pleural effusion and right basilar atelectasis/ consolidation since previous study.   Electronically Signed   By: Burman Nieves M.D.   On: 09/27/2013 05:11   Dg Chest 2 View  09/08/2013   CLINICAL DATA:  Lower abdominal pain.  Hypoxia.  EXAM: CHEST  2 VIEW  COMPARISON:  09/02/2013 and 08/30/2013.  FINDINGS: Trachea is midline. Heart size is grossly stable. Interval improvement in bilateral airspace disease with residual subsegmental atelectasis in the left midlung zone. Small bilateral effusions.   IMPRESSION: Resolved pulmonary edema with left perihilar atelectasis and residual small bilateral effusions.   Electronically Signed   By: Leanna Battles M.D.   On: 09/08/2013 10:58   US Paracentesis  09/21/2013   INDICATION: Symptomatic abdominal ascites. History of cirrhosis. Please perform diagnostic and therapeutic paracentesis.  EXAM: ULTRASOUND-GUIDED PARACENTESIS  COMPARISON:  CT the abdomen pelvis - 08/28/2013; pelvic CT -09/08/2013  MEDICATIONS: None.  COMPLICATIONS: None immediate  TECHNIQUE: Informed written consent was obtained from the patient after a discussion of the risks, benefits and alternatives to treatment. A timeout was performed prior to the initiation of the procedure.  Initial ultrasound scanning demonstrates a moderate amount of ascites within the right lower abdominal quadrant. The right lower abdomen was prepped and draped in the usual sterile fashion. 1% lidocaine with epinephrine was used for local anesthesia. Under direct ultrasound guidance, a 19 gauge, 7-cm, Yueh catheter was introduced. An ultrasound image was saved for documentation purposed. The paracentesis was performed. The catheter was removed and a dressing was applied. The patient tolerated the procedure well without immediate post procedural complication.  FINDINGS: A total of approximately 3.6 liters of serous, yellow tinged fluid was removed. Samples were sent to the laboratory as requested by the clinical team.  IMPRESSION: Successful ultrasound-guided paracentesis yielding 3.6 liters of peritoneal fluid.   Electronically Signed   By: Simonne Come M.D.   On: 09/21/2013 12:24   Ct Pelvis Limited W/o Cm  09/08/2013   CLINICAL DATA:  Scrotal swelling. Right inguinal hernia repair. Incisional bleeding after  right inguinal hernia repair. Postoperative complication due to coagulopathy and cirrhosis.  EXAM: CT PELVIS WITHOUT CONTRAST  TECHNIQUE: Contiguous axial CT images were obtained from the iliac crests through the  proximal femurs to include the scrotum and entire pelvic contents. Multiplanar reformatting was performed in the coronal and sagittal planes. This is NOT a limited exam, and in fact is protocoled to extend well further in the upper legs than a normal pelvic CT.  COMPARISON:  08/28/2013  FINDINGS: Prominent pelvic ascites. Diffuse subcutaneous edema compatible with third spacing of fluid. Mesenteric edema. Sigmoid diverticulosis. Urinary bladder unremarkable.  Right external iliac lymph node short axis diameter 1.3 cm. Clustered additional iliac and inguinal lymph nodes are present bilaterally.  Faintly visible postoperative findings from inguinal hernia repair. Very minimally hyperdense fluid tracks along the inguinal rain and surrounds the right spermatic cord, extending down into the scrotum. I can't really see the inferior epigastric vessels all that well to be able to characterize the hernia further. The right spermatic cord itself appears edematous and expanded compared to the left. This fluid collection deviates the base of the penis to the left. Scrotal skin swelling compatible with the generalized third spacing of fluid.  IMPRESSION: 1. Slightly complex fluid collection tracks from the right inguinal ring around the edematous right spermatic cord and into the scrotum. Given that this large hydrocele measures slightly above the density of the extensive ascites in the pelvis, it may well have some involving blood products. If there is concern for testicular torsion then scrotal Doppler would be recommended. 2. Extensive third spacing of fluid with prominent ascites, subcutaneous edema, mesenteric edema, and scrotal skin swelling. 3. Mildly enlarged external iliac and inguinal lymph nodes could be reactive, neoplastic, or due to passive lymphatic congestion.   Electronically Signed   By: Herbie Baltimore M.D.   On: 09/08/2013 14:02   Dg Chest Port 1 View  09/27/2013   CLINICAL DATA:  Status post  thoracentesis  EXAM: PORTABLE CHEST - 1 VIEW  COMPARISON:  09/21/2013  FINDINGS: Large right pleural effusion. No left pleural effusion. No pneumothorax. Stable cardiomediastinal silhouette. Unremarkable osseous structures.  IMPRESSION: Large right pleural effusion.  No pneumothorax.   Electronically Signed   By: Elige Ko   On: 09/27/2013 19:07   Dg Chest Port 1 View  09/02/2013   CLINICAL DATA:  Shortness of breath  EXAM: PORTABLE CHEST - 1 VIEW  COMPARISON:  08/30/2013  FINDINGS: Prominent cardiomediastinal contours. Central vascular congestion. Perihilar interstitial and hazy airspace opacities. Left pleural effusion has increased and predominantly collects along the periphery of the left hemithorax. Associated airspace consolidation. No pneumothorax. No interval osseous change.  IMPRESSION: Increased central vascular congestion and perihilar interstitial and airspace opacities, favor pulmonary edema.  Increase left pleural effusion and associated airspace consolidation; atelectasis versus pneumonia.   Electronically Signed   By: Jearld Lesch M.D.   On: 09/02/2013 00:49   Dg Chest Port 1 View  08/30/2013   CLINICAL DATA:  Shortness of breath  EXAM: PORTABLE CHEST - 1 VIEW  COMPARISON:  None.  FINDINGS: Cardiac shadow is at the upper limits of normal in size. Increased density is noted in the left lung base consistent with the previously seen atelectasis and pleural effusion. The previously seen right-sided pleural effusion is less well appreciated on this exam. No other focal abnormality is noted.  IMPRESSION: Left basilar atelectasis and pleural effusion.   Electronically Signed   By: Alcide Clever M.D.   On: 08/30/2013  08:17   Dg Abd Acute W/chest  09/21/2013   CLINICAL DATA:  History of hernia surgery 2 weeks ago at [facility]. Persistent infections since. Abdominal pain and distention. History of cirrhosis.  EXAM: ACUTE ABDOMEN SERIES (ABDOMEN 2 VIEW & CHEST 1 VIEW)  COMPARISON:  CT abdomen  pelvis - 09/08/2013 ; CT abdomen pelvis - 08/28/2013 chest radiograph - 09/08/2013; 09/02/2013  FINDINGS: Grossly unchanged borderline enlarged cardiac silhouette and mediastinal contours. Interval increase and now likely moderate size right-sided effusion. Unchanged small left-sided effusion. Mild cephalization of flow without frank evidence of edema.  Paucity of bowel gas without definite evidence of obstruction. There is nonspecific mild gaseous distention of a rather featureless loop of small bowel within the mid abdomen measuring approximately 3.7 cm in diameter, nonspecific in the setting of patient's known ascites. There is a minimal amount of gas seen within the descending colon. No pneumoperitoneum, pneumatosis or portal venous gas.  No acute osseus abnormalities.  IMPRESSION: 1. Interval increase in suspected moderate sized right-sided effusion. 2. Unchanged small left-sided effusion. 3. Pulmonary venous congestion without frank evidence of edema. 4. Paucity of bowel gas without definite evidence of obstruction.   Electronically Signed   By: Simonne Come M.D.   On: 09/21/2013 09:02    [place] [facility] Pager:[phone]  If 7PM-7AM, please contact night-coverage [email or web address] Password [email or web address] 09/29/2013, 8:59 AM   LOS: 2 days   **Disclaimer: This note may have been dictated with voice recognition software. Similar sounding words can inadvertently be transcribed and this note may contain transcription errors which may not have been corrected upon publication of note.**  Attending Patient was seen, examined,treatment plan was discussed with the Physician extender. I have directly reviewed the clinical findings, lab, imaging studies and management of this patient in detail. I have made the necessary changes to the above noted documentation, and agree with the documentation, as recorded by the advance practice provider.   Hyponatremia-likely from overindulgence of fluids  in a setting of cirrhosis-fluid restrict, increase lasix to TID-repeat BMET at 3 pm. Rest as above  Windell Norfolk MD [facility].

## 2013-09-29 NOTE — Progress Notes (Signed)
CRITICAL VALUE ALERT  Critical value received:  NA=119  Date of notification:  09/29/13  Time of notification:  2100  Critical value read back:Yes.    Nurse who received alert:  Randell Loop  MD notified (1st page):  MD on call KIRBY  Time of first page:  2105  MD notified (2nd page):  Time of second page:  Responding MD:  NP Craige Cotta  Time MD responded:  2125

## 2013-09-29 NOTE — Progress Notes (Signed)
Name: Xavier Valentine MRN: [id number] DOB: 1960-05-30    ADMISSION DATE:  09/27/2013 CONSULTATION DATE:  09/27/13  REFERRING MD :  Rito Ehrlich TRH  CHIEF COMPLAINT:  Short of breath  BRIEF PATIENT DESCRIPTION: 44 yoM former smoker with hx cirrhosis due to ETOH, Hep C.  Hosp 08/2013 inguinal hernia repair, then again for HCAP/ Sepsis. Hosp 9/20-22 for ascites w paracentesis. Now presenting with several days of chest tightness, dyspnea, dry cough and large R pleural effusion. PCCM consulted requesting thoracentesis.  SIGNIFICANT EVENTS  US Paracentesis 09/21/13- no growth  STUDIES:     HISTORY OF PRESENT ILLNESS:  57 yoM former smoker with hepatic cirrhosis due to ETOH, HepC. Medically non-compliant.Had inguinal hernia repair at [facility] in August, then came down here to be w family.Admitted late August for HCAP/Sepsis. Readmitted 9/20-22 with ascites. US thoracentesis by IR was neg CX. Says he mentioned tight chest at discharge. CXR 9/7 had shown small bilateral effusions. Says he ran out of his diuretics. Returns now with chest tightness, dyspnea, mild dry cough. Felt that he sprung a R rib with coughing, otw no chest pain, fever, bleeding.Marland Kitchen   PAST MEDICAL HISTORY :   has a past medical history of Alcoholism; Inguinal hernia; Hepatitis C (2000); Perirectal abscess; Cirrhosis (08/2013); Ascites (09/2013); Thrombocytopenia (09/2013); and Coagulopathy (09/2013).  has past surgical history that includes Hernia repair (08/2013). Prior to Admission medications   Medication Sig Start Date End Date Taking? Authorizing Provider  albuterol (PROVENTIL HFA;VENTOLIN HFA) 108 (90 BASE) MCG/ACT inhaler Inhale 2 puffs into the lungs every 6 (six) hours as needed for wheezing or shortness of breath.   Yes Historical Provider, MD  docusate sodium (COLACE) 100 MG capsule Take 100 mg by mouth 2 (two) times daily as needed for mild constipation.   Yes Historical Provider, MD  folic acid (FOLVITE) 1 MG tablet  Take 1 tablet (1 mg total) by mouth daily. 09/09/13  Yes Clydia Llano, MD  furosemide (LASIX) 40 MG tablet Take 40 mg by mouth daily. 09/09/13  Yes Clydia Llano, MD  lactulose (CHRONULAC) 10 GM/15ML solution Take 15 mLs (10 g total) by mouth daily. 09/23/13  Yes Marianne L York, PA-C  Multiple Vitamin (MULTIVITAMIN WITH MINERALS) TABS tablet Take 1 tablet by mouth daily. 09/09/13  Yes Clydia Llano, MD  neomycin-bacitracin-polymyxin (NEOSPORIN) OINT Apply 1 application topically as needed for wound care. 09/23/13  Yes Tora Kindred York, PA-C  nystatin ointment (MYCOSTATIN) Apply topically 2 (two) times daily. 09/23/13  Yes Tora Kindred York, PA-C  oxyCODONE (ROXICODONE) 5 MG immediate release tablet Take 1 tablet (5 mg total) by mouth every 4 (four) hours as needed for severe pain. 09/23/13  Yes Tora Kindred York, PA-C  spironolactone (ALDACTONE) 100 MG tablet Take 1 tablet (100 mg total) by mouth daily. 09/23/13  Yes Tora Kindred York, PA-C  tamsulosin (FLOMAX) 0.4 MG CAPS capsule Take 1 capsule (0.4 mg total) by mouth daily. 09/23/13  Yes Tora Kindred York, PA-C  thiamine 100 MG tablet Take 1 tablet (100 mg total) by mouth daily. 09/09/13  Yes Clydia Llano, MD   Allergies  Allergen Reactions  . Bee Venom Swelling    FAMILY HISTORY:  family history includes CAD in an other family member; Cancer in his father and maternal grandfather; Diabetes in his mother; Hypertension in his mother and another family member. SOCIAL HISTORY:  reports that he quit smoking about 5 years ago. His smoking use included Cigarettes. He smoked 0.00 packs per day. He has never  used smokeless tobacco. He reports that he uses illicit drugs (Marijuana). He reports that he does not drink alcohol. Has used cocaine.  REVIEW OF SYSTEMS:   Constitutional: Negative for fever, chills, weight loss, malaise/fatigue and diaphoresis.  HENT: Negative for hearing loss, ear pain, nosebleeds, congestion, sore throat, neck pain, tinnitus and ear discharge.     Eyes: Negative for blurred vision, double vision, photophobia, pain, discharge and redness.  Respiratory: + cough, No-hemoptysis, sputum production, +shortness of breath, No-wheezing and stridor.   Cardiovascular: Negative for chest pain, palpitations, orthopnea, claudication, leg swelling and PND.  Gastrointestinal: Negative for heartburn, nausea, vomiting, abdominal pain, diarrhea, constipation, blood in stool and melena.  Genitourinary: Negative for dysuria, urgency, frequency, hematuria and flank pain.  Musculoskeletal: Negative for myalgias, back pain, joint pain and falls.  Skin: Negative for itching and rash.  Neurological: Negative for dizziness, tingling, tremors, sensory change, speech change, focal weakness, seizures, loss of consciousness, weakness and headaches.  Endo/Heme/Allergies: Negative for environmental allergies and polydipsia. Does not bruise/bleed easily.  SUBJECTIVE:   VITAL SIGNS: Temp:  [97.5 F (36.4 C)-98.2 F (36.8 C)] 97.7 F (36.5 C) (09/28 0618) Pulse Rate:  [70-87] 70 (09/28 0618) Resp:  [18] 18 (09/28 0618) BP: (102-145)/(61-72) 102/61 mmHg (09/28 0618) SpO2:  [94 %-98 %] 94 % (09/28 0618) Weight:  [80.6 kg (177 lb 11.1 oz)] 80.6 kg (177 lb 11.1 oz) (09/28 0500)  PHYSICAL EXAMINATION: General:  Thin wM, NAD, alert Neuro: MAE. Movements, restless, jerky, almost chorea. Talkative. Refers to inheriting large sums of money. HEENT:Mucosa clear, speech clear,nose and throat- clear Cardiovascular:  RRR, no m/g/rub noted, no JVD Lungs:  Dull R chest, unlabored, no rub/ cough, wheeze Abdomen:  Softly distended/ balotable Musculoskeletal:  Muscle atrophy extremities Skin: norash   Recent Labs Lab 09/28/13 0626 09/29/13 0655 09/29/13 0906  NA 127* 119* 120*  K 4.3 4.2 4.3  CL 90* 85* 83*  CO2 BUN CREATININE 0.91 0.76 0.77  GLUCOSE 86 84 121*    Recent Labs Lab 09/23/13 0825 09/27/13 0521 09/28/13 0626  HGB 11.0* 11.7*  10.8*  HCT 31.4* 33.9* 30.7*  WBC 6.0 9.4 10.8*  PLT 100* 93* 74*   Dg Chest Port 1 View  09/27/2013   CLINICAL DATA:  Status post thoracentesis  EXAM: PORTABLE CHEST - 1 VIEW  COMPARISON:  09/21/2013  FINDINGS: Large right pleural effusion. No left pleural effusion. No pneumothorax. Stable cardiomediastinal silhouette. Unremarkable osseous structures.  IMPRESSION: Large right pleural effusion.  No pneumothorax.   Electronically Signed   By: Elige Ko   On: 09/27/2013 19:07    ASSESSMENT / PLAN:  Pleural effusion- R transudative fluid by light's criteria.  Due to cirrhosis and likely a diaphragmatic defect.  Patient is not compliant with diuretics.  No indications for further thora at this point as it will simply recur again.  Recommend advisement on compliance with diuretic therapy.  If become a recurrent problem can consider a pleurex catheter but that is a very last resort and usually causes more complications that it solves in these scenarios.  PCCM will sign off, please call back if needed.  Medical noncompliance- consider mild dementia as a cause, may benefit from seeing psychiatry, will defer to primary.  Alyson Reedy, M.D. [facility]monary[facility]. Pager: [phone]. After hours pager: [phone].  09/29/2013, 11:42 AM

## 2013-09-30 LAB — BASIC METABOLIC PANEL
ANION GAP: 11 (ref 5–15)
BUN: 18 mg/dL (ref 6–23)
CALCIUM: 8 mg/dL — AB (ref 8.4–10.5)
CHLORIDE: 86 meq/L — AB (ref 96–112)
CO2: 26 meq/L (ref 19–32)
Creatinine, Ser: 0.69 mg/dL (ref 0.50–1.35)
GFR calc Af Amer: >90 mL/min (ref >90)
GFR calc non Af Amer: >90 mL/min (ref >90)
GLUCOSE: 78 mg/dL (ref 70–99)
Potassium: 4 mEq/L (ref 3.7–5.3)
Sodium: 123 mEq/L — ABNORMAL LOW (ref 137–147)

## 2013-09-30 LAB — OSMOLALITY, URINE: OSMOLALITY UR: 432 mosm/kg (ref 390–1090)

## 2013-09-30 MED ORDER — FUROSEMIDE 10 MG/ML IJ SOLN
60.0000 mg | Freq: Three times a day (TID) | INTRAMUSCULAR | Status: DC
  Administered 2013-09-30 – 2013-10-01 (×4): 60 mg via INTRAVENOUS
  Filled 2013-09-30 (×5): qty 6

## 2013-09-30 MED ORDER — OXYCODONE HCL 5 MG PO TABS
5.0000 mg | ORAL_TABLET | ORAL | Status: DC | PRN
  Administered 2013-09-30: 5 mg via ORAL
  Filled 2013-09-30: qty 1

## 2013-09-30 NOTE — Progress Notes (Signed)
Physical Therapy Treatment Patient Details Name: Xavier Valentine MRN: [id number] DOB: 1960/05/22 Today's Date: 09/30/2013    History of Present Illness Right-sided chest pain or shortness of breath and cough. 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 Reported that he ran out of his diuretics.    PT Comments    Patient continues to make good progress with mobility. PLanning to DC later today if lab are ok. Continue with current POC and could benefit to attempt stairs next session if still here and agreeable.   Follow Up Recommendations  Home health PT;Supervision/Assistance - 24 hour     Equipment Recommendations  None recommended by PT    Recommendations for Other Services       Precautions / Restrictions Precautions Precautions: None    Mobility  Bed Mobility Overal bed mobility: Independent                Transfers Overall transfer level: Independent                  Ambulation/Gait Ambulation/Gait assistance: Supervision Ambulation Distance (Feet): 600 Feet Assistive device: None       General Gait Details: cues for safety and gait speed especially with turns.    Stairs         General stair comments: declined stair training this session  Wheelchair Mobility    Modified Rankin (Stroke Patients Only)       Balance                                    Cognition Arousal/Alertness: Awake/alert Behavior During Therapy: WFL for tasks assessed/performed Overall Cognitive Status: Within Functional Limits for tasks assessed                       Exercises      General Comments        Pertinent Vitals/Pain Pain Assessment: No/denies pain    Home Living                      Prior Function            PT Goals (current goals can now be found in the care plan section) Progress towards PT goals: Progressing toward goals    Frequency  Min 3X/week    PT Plan Current plan remains appropriate    Co-evaluation             End of Session Equipment Utilized During Treatment: Gait belt Activity Tolerance: Patient tolerated treatment well Patient left: in bed;with call bell/phone within reach;with nursing/sitter in room     Time: 1004-1017 PT Time Calculation (min): 13 min  Charges:  $Gait Training: 8-22 mins                    G Codes:      Fredrich BirksRobinette, Julia Elizabeth 09/30/2013, 2:11 PM  09/30/2013 Fredrich Birksobinette, Julia Elizabeth PTA [phone] pager [phone] office

## 2013-09-30 NOTE — Progress Notes (Signed)
PATIENT DETAILS Name: Xavier Valentine Age: 53 y.o. Sex: male Date of Birth: 12-Feb-1960 Admit Date: 09/27/2013 Admitting Physician Osvaldo Shipper, MD VHQ:IONGEX, Ayesha Rumpf, MD  Subjective: No complaints.  Understands he remains in the hospital due to low sodium.  Assessment/Plan: Principal Problem:  Pleural effusion on right -secondary to liver cirrhoses-pleural fluid consistent with transudate by Light's criteria -optimize diuretic regimen. No indication for infection-will stop IV Abx -S/P thoracentesis 1400 ml drawn off on 9/26 by pulmonary. -Patient continues to have decreased breath sounds on the right, but thus far, no recurrence of SOB or CP.  Active Problems: Hyponatremia -secondary to Cirrhoses and non compliance with fluid restriction.  Appears to be improving at 123 this am. -increased  IV Lasix to 60 mg TID, continue fluid restriction of 800 ml. -Spironolactone d/c'd on 9/28.  Liver Cirrhoses with coagulopathy, thrombocytopenia, ascites-secondary to Hep C and ETOH -change lactulose to 30 mg BID,c/w IV Lasix -claims no ETOH since early Sept -has outpatient appt with GI next month  Coagulopathy -secondary to above -monitor INR periodically (2.17 on 9/26)  Thrombocytopenia -stable -monitor CBC periodically (decreasing at 74 on 9/28)  Recent Right Hernia Repair -wound/incision site-with no evidence of infection  Alcohol use -claims no drink/Etoh since early Sept-counseled extensively -no signs of withdrawal  Hx of Cocaine Abuse -counseled extensively-claims last use was in Early sept  Disposition: Remain inpatient  DVT Prophylaxis: SCD's  Code Status: Full code  Family Communication None at bedside  Procedures:  None  CONSULTS:  PCCM  Time spent 40 minutes-which includes 50% of the time with face-to-face with patient/ family and coordinating care related to the above assessment and plan.  MEDICATIONS: Scheduled Meds: . folic acid  1  mg Oral Daily  . furosemide  60 mg Intravenous TID  . lactulose  30 g Oral BID  . multivitamin with minerals  1 tablet Oral Daily  . neomycin-bacitracin-polymyxin  1 application Topical TID  . sodium chloride  3 mL Intravenous Q12H  . tamsulosin  0.4 mg Oral Daily  . thiamine  100 mg Oral Daily   Continuous Infusions:  PRN Meds:.sodium chloride, acetaminophen, acetaminophen, albuterol, docusate sodium, morphine injection, ondansetron (ZOFRAN) IV, ondansetron, oxyCODONE, sodium chloride  Antibiotics: Anti-infectives   Start     Dose/Rate Route Frequency Ordered Stop   09/27/13 0830  vancomycin (VANCOCIN) IVPB 1000 mg/200 mL premix  Status:  Discontinued     1,000 mg 200 mL/hr over 60 Minutes Intravenous Every 8 hours 09/27/13 0823 09/27/13 0827   09/27/13 0830  piperacillin-tazobactam (ZOSYN) IVPB 3.375 g  Status:  Discontinued     3.375 g 12.5 mL/hr over 240 Minutes Intravenous 3 times per day 09/27/13 0823 09/28/13 1444   09/27/13 0830  vancomycin (VANCOCIN) IVPB 750 mg/150 ml premix  Status:  Discontinued     750 mg 150 mL/hr over 60 Minutes Intravenous Every 8 hours 09/27/13 0827 09/28/13 1444       PHYSICAL EXAM: Vital signs in last 24 hours: Filed Vitals:   09/29/13 0618 09/29/13 1327 09/29/13 2121 09/30/13 0614  BP: 102/61 111/64 111/53 131/69  Pulse: 70 78 80 80  Temp: 97.7 F (36.5 C) 98.2 F (36.8 C) 98 F (36.7 C) 97.9 F (36.6 C)  TempSrc: Oral Oral Oral Oral  Resp: 18 18 18 18   Height:      Weight:    80 kg (176 lb 5.9 oz)  SpO2: 94% 94% 90% 94%    Weight  change: -0.6 kg (-1 lb 5.2 oz) Filed Weights   09/28/13 0603 09/29/13 0500 09/30/13 0614  Weight: 80.6 kg (177 lb 11.1 oz) 80.6 kg (177 lb 11.1 oz) 80 kg (176 lb 5.9 oz)   Body mass index is 24.61 kg/(m^2).   Gen Exam: Awake and alert with clear speech.  Pleasant. Neck: Supple, No JVD.   Chest: B/L Clear-still with decreased air entry on right base.  Bandage on back clean and dry. CVS: S1 S2  Regular, no murmurs.  Abdomen: soft, BS +, non tender, ascites does not appear to be re accumulating. Extremities: no edema, lower extremities warm to touch. Neurologic: Non Focal.     Intake/Output from previous day:  Intake/Output Summary (Last 24 hours) at 09/30/13 1355 Last data filed at 09/30/13 0900  Gross per 24 hour  Intake    600 ml  Output    300 ml  Net    300 ml   Strict I&O hard to track as patient is not compliant using the urinal and continues to use the commode.  LAB RESULTS: CBC  Recent Labs Lab 09/27/13 0521 09/28/13 0626  WBC 9.4 10.8*  HGB 11.7* 10.8*  HCT 33.9* 30.7*  PLT 93* 74*  MCV 93.4 90.0  MCH 32.2 31.7  MCHC 34.5 35.2  RDW 14.9 14.6  LYMPHSABS 1.5  --   MONOABS 0.9  --   EOSABS 0.2  --   BASOSABS 0.0  --     Chemistries   Recent Labs Lab 09/29/13 0655 09/29/13 0906 09/29/13 1301 09/29/13 1955 09/30/13 0710  NA 119* 120* 123* 119* 123*  K 4.2 4.3 4.2 4.1 4.0  CL 85* 83* 84* 84* 86*  CO2 24 27 30 24 26   GLUCOSE 84 121* 100* 88 78  BUN 19 18 18 19 18   CREATININE 0.76 0.77 0.77 0.74 0.69  CALCIUM 7.8* 7.9* 8.4 7.6* 8.0*     GFR Estimated Creatinine Clearance: 111.1 ml/min (by C-G formula based on Cr of 0.69).  Coagulation profile  Recent Labs Lab 09/27/13 0521  INR 2.17*   MICROBIOLOGY: Recent Results (from the past 240 hour(s))  GRAM STAIN     Status: None   Collection Time    09/21/13 11:38 AM      Result Value Ref Range Status   Specimen Description PERITONEAL CAVITY   Final   Special Requests NONE   Final   Gram Stain     Final   Value: CYTOSPIN SLIDE     WBC PRESENT,BOTH PMN AND MONONUCLEAR     NO ORGANISMS SEEN   Report Status 09/21/2013 FINAL   Final  BODY FLUID CULTURE     Status: None   Collection Time    09/21/13 11:38 AM      Result Value Ref Range Status   Specimen Description PERITONEAL CAVITY   Final   Special Requests NONE   Final   Gram Stain     Final   Value: CYTOSPIN WBC PRESENT,BOTH PMN  AND MONONUCLEAR     NO ORGANISMS SEEN     Performed at [facility]     Performed at [facility]   Culture     Final   Value: NO GROWTH 3 DAYS     Performed at [facility]   Report Status 09/25/2013 FINAL   Final  BODY FLUID CULTURE     Status: None   Collection Time    09/27/13  6:25 PM  Result Value Ref Range Status   Specimen Description PLEURAL RIGHT FLUID   Final   Special Requests NONE   Final   Gram Stain     Final   Value: FEW WBC PRESENT, PREDOMINANTLY MONONUCLEAR     NO ORGANISMS SEEN     Performed at [facility]   Culture     Final   Value: NO GROWTH 2 DAYS     Performed at [facility]   Report Status PENDING   Incomplete    RADIOLOGY STUDIES/RESULTS: Dg Chest 2 View  09/27/2013   CLINICAL DATA:  Cough, congestion, fever, shortness of breath, right-sided chest pain. Pneumonia last week.  EXAM: CHEST  2 VIEW  COMPARISON:  09/21/2013  FINDINGS: Significant increase in right pleural effusion and basilar atelectasis/ consolidation since previous study. Now also blunting of left costophrenic angle. Normal heart size and pulmonary vascularity. Left lung is clear.  IMPRESSION: Increasing size of right pleural effusion and right basilar atelectasis/ consolidation since previous study.   Electronically Signed   By: Burman Nieves M.D.   On: 09/27/2013 05:11   Dg Chest 2 View  09/08/2013   CLINICAL DATA:  Lower abdominal pain.  Hypoxia.  EXAM: CHEST  2 VIEW  COMPARISON:  09/02/2013 and 08/30/2013.  FINDINGS: Trachea is midline. Heart size is grossly stable. Interval improvement in bilateral airspace disease with residual subsegmental atelectasis in the left midlung zone. Small bilateral effusions.  IMPRESSION: Resolved pulmonary edema with left perihilar atelectasis and residual small bilateral effusions.   Electronically Signed   By: Leanna Battles M.D.   On: 09/08/2013 10:58   US Paracentesis  09/21/2013   INDICATION: Symptomatic  abdominal ascites. History of cirrhosis. Please perform diagnostic and therapeutic paracentesis.  EXAM: ULTRASOUND-GUIDED PARACENTESIS  COMPARISON:  CT the abdomen pelvis - 08/28/2013; pelvic CT -09/08/2013  MEDICATIONS: None.  COMPLICATIONS: None immediate  TECHNIQUE: Informed written consent was obtained from the patient after a discussion of the risks, benefits and alternatives to treatment. A timeout was performed prior to the initiation of the procedure.  Initial ultrasound scanning demonstrates a moderate amount of ascites within the right lower abdominal quadrant. The right lower abdomen was prepped and draped in the usual sterile fashion. 1% lidocaine with epinephrine was used for local anesthesia. Under direct ultrasound guidance, a 19 gauge, 7-cm, Yueh catheter was introduced. An ultrasound image was saved for documentation purposed. The paracentesis was performed. The catheter was removed and a dressing was applied. The patient tolerated the procedure well without immediate post procedural complication.  FINDINGS: A total of approximately 3.6 liters of serous, yellow tinged fluid was removed. Samples were sent to the laboratory as requested by the clinical team.  IMPRESSION: Successful ultrasound-guided paracentesis yielding 3.6 liters of peritoneal fluid.   Electronically Signed   By: Simonne Come M.D.   On: 09/21/2013 12:24   Ct Pelvis Limited W/o Cm  09/08/2013   CLINICAL DATA:  Scrotal swelling. Right inguinal hernia repair. Incisional bleeding after right inguinal hernia repair. Postoperative complication due to coagulopathy and cirrhosis.  EXAM: CT PELVIS WITHOUT CONTRAST  TECHNIQUE: Contiguous axial CT images were obtained from the iliac crests through the proximal femurs to include the scrotum and entire pelvic contents. Multiplanar reformatting was performed in the coronal and sagittal planes. This is NOT a limited exam, and in fact is protocoled to extend well further in the upper legs than a  normal pelvic CT.  COMPARISON:  08/28/2013  FINDINGS: Prominent pelvic ascites. Diffuse  subcutaneous edema compatible with third spacing of fluid. Mesenteric edema. Sigmoid diverticulosis. Urinary bladder unremarkable.  Right external iliac lymph node short axis diameter 1.3 cm. Clustered additional iliac and inguinal lymph nodes are present bilaterally.  Faintly visible postoperative findings from inguinal hernia repair. Very minimally hyperdense fluid tracks along the inguinal rain and surrounds the right spermatic cord, extending down into the scrotum. I can't really see the inferior epigastric vessels all that well to be able to characterize the hernia further. The right spermatic cord itself appears edematous and expanded compared to the left. This fluid collection deviates the base of the penis to the left. Scrotal skin swelling compatible with the generalized third spacing of fluid.  IMPRESSION: 1. Slightly complex fluid collection tracks from the right inguinal ring around the edematous right spermatic cord and into the scrotum. Given that this large hydrocele measures slightly above the density of the extensive ascites in the pelvis, it may well have some involving blood products. If there is concern for testicular torsion then scrotal Doppler would be recommended. 2. Extensive third spacing of fluid with prominent ascites, subcutaneous edema, mesenteric edema, and scrotal skin swelling. 3. Mildly enlarged external iliac and inguinal lymph nodes could be reactive, neoplastic, or due to passive lymphatic congestion.   Electronically Signed   By: Herbie Baltimore M.D.   On: 09/08/2013 14:02   Dg Chest Port 1 View  09/27/2013   CLINICAL DATA:  Status post thoracentesis  EXAM: PORTABLE CHEST - 1 VIEW  COMPARISON:  09/21/2013  FINDINGS: Large right pleural effusion. No left pleural effusion. No pneumothorax. Stable cardiomediastinal silhouette. Unremarkable osseous structures.  IMPRESSION: Large right pleural  effusion.  No pneumothorax.   Electronically Signed   By: Elige Ko   On: 09/27/2013 19:07   Dg Chest Port 1 View  09/02/2013   CLINICAL DATA:  Shortness of breath  EXAM: PORTABLE CHEST - 1 VIEW  COMPARISON:  08/30/2013  FINDINGS: Prominent cardiomediastinal contours. Central vascular congestion. Perihilar interstitial and hazy airspace opacities. Left pleural effusion has increased and predominantly collects along the periphery of the left hemithorax. Associated airspace consolidation. No pneumothorax. No interval osseous change.  IMPRESSION: Increased central vascular congestion and perihilar interstitial and airspace opacities, favor pulmonary edema.  Increase left pleural effusion and associated airspace consolidation; atelectasis versus pneumonia.   Electronically Signed   By: Jearld Lesch M.D.   On: 09/02/2013 00:49   Dg Chest Port 1 View  08/30/2013   CLINICAL DATA:  Shortness of breath  EXAM: PORTABLE CHEST - 1 VIEW  COMPARISON:  None.  FINDINGS: Cardiac shadow is at the upper limits of normal in size. Increased density is noted in the left lung base consistent with the previously seen atelectasis and pleural effusion. The previously seen right-sided pleural effusion is less well appreciated on this exam. No other focal abnormality is noted.  IMPRESSION: Left basilar atelectasis and pleural effusion.   Electronically Signed   By: Alcide Clever M.D.   On: 08/30/2013 08:17   Dg Abd Acute W/chest  09/21/2013   CLINICAL DATA:  History of hernia surgery 2 weeks ago at [facility]. Persistent infections since. Abdominal pain and distention. History of cirrhosis.  EXAM: ACUTE ABDOMEN SERIES (ABDOMEN 2 VIEW & CHEST 1 VIEW)  COMPARISON:  CT abdomen pelvis - 09/08/2013 ; CT abdomen pelvis - 08/28/2013 chest radiograph - 09/08/2013; 09/02/2013  FINDINGS: Grossly unchanged borderline enlarged cardiac silhouette and mediastinal contours. Interval increase and now likely moderate size right-sided effusion.  Unchanged small left-sided effusion. Mild cephalization of flow without frank evidence of edema.  Paucity of bowel gas without definite evidence of obstruction. There is nonspecific mild gaseous distention of a rather featureless loop of small bowel within the mid abdomen measuring approximately 3.7 cm in diameter, nonspecific in the setting of patient's known ascites. There is a minimal amount of gas seen within the descending colon. No pneumoperitoneum, pneumatosis or portal venous gas.  No acute osseus abnormalities.  IMPRESSION: 1. Interval increase in suspected moderate sized right-sided effusion. 2. Unchanged small left-sided effusion. 3. Pulmonary venous congestion without frank evidence of edema. 4. Paucity of bowel gas without definite evidence of obstruction.   Electronically Signed   By: Simonne ComeJohn  Watts M.D.   On: 09/21/2013 09:02    Conley CanalYork, Marianne L, PA-C [facility] Pager:[phone]  If 7PM-7AM, please contact night-coverage [email or web address] Password [email or web address] 09/30/2013, 1:55 PM   LOS: 3 days   **Disclaimer: This note may have been dictated with voice recognition software. Similar sounding words can inadvertently be transcribed and this note may contain transcription errors which may not have been corrected upon publication of note.**  Attending Patient was seen, examined,treatment plan was discussed with the  Advance Practice Provider.  I have directly reviewed the clinical findings, lab, imaging studies and management of this patient in detail. I have made the necessary changes to the above noted documentation, and agree with the documentation, as recorded by the Advance Practice Provider.   In short, pleural effusion and shortness of breath is stable. Hyponatremia slowly improving with IV Lasix. Increased IV Lasix to 60 mg 3 times a day, continue with fluid restriction. Discussed with patient.  Windell NorfolkS Ghimire MD [facility].

## 2013-09-30 NOTE — Progress Notes (Signed)
I have seen and examined this patient and agree with the plan of care  Donathan Buller W 09/30/2013, 8:07 PM  

## 2013-09-30 NOTE — Progress Notes (Signed)
Subjective: No complaints. No change in physical exam.  Na 123 this AM.  816 cc fluid intake over past 24 hours.   Objective:  Infusions:   Scheduled Medications: . folic acid  1 mg Oral Daily  . furosemide  60 mg Intravenous TID  . lactulose  30 g Oral BID  . multivitamin with minerals  1 tablet Oral Daily  . neomycin-bacitracin-polymyxin  1 application Topical TID  . sodium chloride  3 mL Intravenous Q12H  . tamsulosin  0.4 mg Oral Daily  . thiamine  100 mg Oral Daily   PRN Meds:.sodium chloride, acetaminophen, acetaminophen, albuterol, docusate sodium, morphine injection, ondansetron (ZOFRAN) IV, ondansetron, oxyCODONE, sodium chloride  BP 131/69  Pulse 80  Temp(Src) 97.9 F (36.6 C) (Oral)  Resp 18  Ht 5\' 11"  (1.803 m)  Wt 176 lb 5.9 oz (80 kg)  BMI 24.61 kg/m2  SpO2 94%   Intake/Output Summary (Last 24 hours) at 09/30/13 1305 Last data filed at 09/30/13 0900  Gross per 24 hour  Intake    600 ml  Output    300 ml  Net    300 ml    Weight change: -1 lb 5.2 oz (-0.6 kg)  EXAM:  General: Thin appearing male, jaundiced, figety, alert, cooperative, NAD. HEENT: PERRL, EOMI. Mild icterus. Moist mucus membranes. Neck: Full range of motion without pain, supple, no lymphadenopathy or carotid bruits Lungs: Decreased breath sounds in right lung field. Otherwise clear to auscultation. No wheezes, crackles, or rales.  Heart: RRR, no murmurs, gallops, or rubs Abdomen: Soft, distended, fluid wave, dull to percussion at the flanks. Bowel sounds present.  Extremities: No cyanosis or clubbing. Thin extremities, trace pitting edema in LE's.  Neurologic: Alert & oriented x3, cranial nerves II-XII intact, strength grossly intact, sensation intact to light touch. Tangential speech.    Labs: Basic Metabolic Panel:  Recent Labs Lab 09/29/13 1301 09/29/13 1955 09/30/13 0710  NA 123* 119* 123*  K 4.2 4.1 4.0  CL 84* 84* 86*  CO2 30 24 26   GLUCOSE 100* 88 78  BUN 18 19 18    CREATININE 0.77 0.74 0.69  CALCIUM 8.4 7.6* 8.0*    Liver Function Tests:  Recent Labs Lab 09/27/13 0521 09/27/13 1925 09/28/13 0626  AST 51*  --  43*  ALT 32  --  28  ALKPHOS 89  --  69  BILITOT 3.0*  --  3.9*  PROT 7.6 6.7 6.6  ALBUMIN 2.3*  --  1.9*    Recent Labs Lab 09/28/13 0626  AMMONIA 58    CBC:  Recent Labs Lab 09/27/13 0521 09/28/13 0626  WBC 9.4 10.8*  NEUTROABS 6.8  --   HGB 11.7* 10.8*  HCT 33.9* 30.7*  MCV 93.4 90.0  PLT 93* 74*    Studies/Results: Dg Chest 2 View  09/29/2013   CLINICAL DATA:  Right pleural effusion  EXAM: CHEST  2 VIEW  COMPARISON:  09/27/2013  FINDINGS: There is a large right pleural effusion unchanged from the prior exam. There is no left pleural effusion. There is no focal parenchymal opacity. There is no pneumothorax. The heart and mediastinal contours are unremarkable.  The osseous structures are unremarkable.  IMPRESSION: Large right pleural effusion unchanged from 09/27/2013.   Electronically Signed   By: Elige Ko   On: 09/29/2013 13:17   Assessment/Plan: 53 y/o M w/ Cirrhosis (Child-Pugh Class C), 2/2 Hep C and alcohol abuse, thrombocytopenia, coagulopathy, and hyponatremia, admitted for right-sided pleural effusion, found to have worsening  hyponatremia.   Acute on Chronic Hypo-osmolar Hypervolemic Hyponatremia- Most likely related to worsening liver disease. Na 123 this AM. Spironolactone held yesterday. Urine osmolarity 432, Urine sodium 52, Serum osmolarity 261. Appropriate ADH response, therefore do not suspect SIADH. Continue fluid restriction and diuresis. Currently on Lasix 60 IV tid.  Cirrhosis- Given patient's thromobocytopenia, coagulopathy, and significant ascites, Patient is classified as Child's Pugh class C (reflecting 45% 1 year survival, 35% 2 year survival), and w/ a Meld score of 20.25 (76% 3 month mortality). Worsening hyponatremia also poor prognosis for this patient.  Thrombocytopenia- 2/2 liver  disease. No signs of bleeding currently. Continue to monitor.  Coagulopathy- INR 2.17 on admission, given Vit K.  Alcohol Abuse- States he has not had alcohol in 2 months. May exhibit some mild dementia?. On Thiamine/Folate.   We will sign off at this time. Please re-consult with further questions. Thank you!   Lars MassonJones, Aubriella Perezgarcia PGY-2, Internal Medicine [phone]

## 2013-10-01 DIAGNOSIS — N400 Enlarged prostate without lower urinary tract symptoms: Secondary | ICD-10-CM

## 2013-10-01 DIAGNOSIS — B182 Chronic viral hepatitis C: Secondary | ICD-10-CM

## 2013-10-01 DIAGNOSIS — Z833 Family history of diabetes mellitus: Secondary | ICD-10-CM

## 2013-10-01 LAB — BASIC METABOLIC PANEL
Anion gap: 12 (ref 5–15)
BUN: 19 mg/dL (ref 6–23)
CALCIUM: 8.3 mg/dL — AB (ref 8.4–10.5)
CO2: 25 meq/L (ref 19–32)
CREATININE: 0.81 mg/dL (ref 0.50–1.35)
Chloride: 89 mEq/L — ABNORMAL LOW (ref 96–112)
GFR calc Af Amer: >90 mL/min (ref >90)
GFR calc non Af Amer: >90 mL/min (ref >90)
GLUCOSE: 75 mg/dL (ref 70–99)
Potassium: 4.5 mEq/L (ref 3.7–5.3)
SODIUM: 126 meq/L — AB (ref 137–147)

## 2013-10-01 LAB — BODY FLUID CULTURE: CULTURE: NO GROWTH

## 2013-10-01 MED ORDER — FUROSEMIDE 40 MG PO TABS: 80.0000 mg | ORAL_TABLET | Freq: Two times a day (BID) | ORAL | Status: DC

## 2013-10-01 MED ORDER — DOCUSATE SODIUM 100 MG PO CAPS: 100.0000 mg | ORAL_CAPSULE | Freq: Two times a day (BID) | ORAL | Status: DC | PRN

## 2013-10-01 MED ORDER — LACTULOSE 10 GM/15ML PO SOLN: 30.0000 g | Freq: Every day | ORAL | Status: DC

## 2013-10-01 NOTE — Care Management Note (Signed)
    Page 1 of 1   10/01/2013     3:22:47 PM CARE MANAGEMENT NOTE 10/01/2013  Patient:  Wyvonnia DuskyWELLS,Krikor L   Account Number:  [id number]  Date Initiated:  10/01/2013  Documentation initiated by:  Letha CapeAYLOR,Traven Davids  Subjective/Objective Assessment:   dx pl effusion  admit- from home, pta indep.     Action/Plan:   pt eval- rec hhpt- patient has medicaid not able to get pt unless cva or fx dx.   Anticipated DC Date:  10/01/2013   Anticipated DC Plan:  HOME/SELF CARE      DC Planning Services  CM consult      Choice offered to / List presented to:             Status of service:  Completed, signed off Medicare Important Message given?  NO (If response is "NO", the following Medicare IM given date fields will be blank) Date Medicare IM given:   Medicare IM given by:   Date Additional Medicare IM given:   Additional Medicare IM given by:    Discharge Disposition:  HOME/SELF CARE  Per UR Regulation:  Reviewed for med. necessity/level of care/duration of stay  If discussed at Long Length of Stay Meetings, dates discussed:    Comments:  10/01/13 1520 Letha Capeeborah Yeilin Zweber RN, BSN [phone] patient is for dc today, pt rec hhpt but patient has medicaid insuance which will not cover hhpt unless patient has had a cva or a fx on this admission.   NCM explained this to patient and he stated he does not need hhpt he is fine.

## 2013-10-01 NOTE — Discharge Instructions (Signed)
Low sodium diet.  1 liter (1000 ml) fluid restriction.  Take Lasix (furosemide as directed)  80 mg twice a day.  Spironolactone has been discontinued.  Please see your primary care physician in 5-7 days to have your electrolytes checked.

## 2013-10-01 NOTE — Discharge Summary (Signed)
Physician Discharge Summary  Xavier Valentine ZOX:[id number] DOB: 12-28-1960 DOA: 09/27/2013  PCP: Xavier Cheadle, MD  Admit date: 09/27/2013 Discharge date: 10/01/2013  Time spent: 45 minutes  Recommendations for Outpatient Follow-up:  1. BMET in 5-7 days.  Patient will likely need adjustment of furosemide. 2. Patient with hyponatremia.  Improved after d/c of spironolactone, diuresis with furosemide and fluid intake restriction. 3.  Monitor Pleural effusion.  Discharge Diagnoses:  Principal Problem:   Pleural effusion on right Active Problems:   Coagulopathy   Thrombocytopenia   Ascites   Liver cirrhosis   Alcohol abuse, in remission   Hepatitis C   Discharge Condition: stable.  Diet recommendation: low salt.  Fluid restriction of 1000 ml per day.  Filed Weights   09/29/13 0500 09/30/13 0614 10/01/13 0602  Weight: 80.6 kg (177 lb 11.1 oz) 80 kg (176 lb 5.9 oz) 76.2 kg (167 lb 15.9 oz)    History of present illness:  HPI: Xavier Valentine is a 53 y.o. male with a past medical history of liver cirrhosis, recent admission for significant ascites, requiring paracentesis, recent inguinal hernia repair (at [facility]) with a wound infection and sepsis, recent healthcare associated pneumonia, and coagulopathy, who was discharged from the hospital on September 23, 2013. He did well for about one or 2 days, but then started developing right-sided chest pain, and cough.  Since symptoms were getting worse he decided to present to the hospital.    Hospital Course:  Right Pleural Effusion  -secondary to liver cirrhoses-pleural fluid consistent with transudate by Light's criteria  -S/P thoracentesis 1400 ml drawn off on 9/26 by pulmonary. -Patient continues to have decreased breath sounds on the right, but thus far, no recurrence of SOB or CP.  -Patient was diuresed.  He was discharged on furosemide.  Dosage will likely need adjustment.  Active Problems:  Hyponatremia  -secondary to  Cirrhoses and non compliance  -Nephrology consulted and recommended discontinuation of spironolactone. -With heavy diuresis (Lasix IV 60 mg TID) and fluid restriction, serum sodium improved.  -baseline sodium 1 month ago was 127.  Today sodium was 126. -Patient was discharged on 80 mg lasix bid without potassium supplementation as his potassium has been rising on lasix alone.   -Recommend BMET in 1 week.    Liver Cirrhoses with coagulopathy, thrombocytopenia, ascites-secondary to Hep C and ETOH  -continue lactulose 30 mg daily  -claims no ETOH since early Sept  -has outpatient appt with GI next month   Coagulopathy  -secondary to above  -monitor INR periodically (2.17 on 9/26)   Thrombocytopenia  -stable  -monitor CBC periodically  (74 on 9/28)   Recent Right Hernia Repair  -wound/incision site-with no evidence of infection   Alcohol use  -claims no drink/Etoh since early Sept-counseled extensively  -no signs of withdrawal   Hx of Cocaine Abuse  -counseled extensively-claims last use was in Early sept   Procedures:  Thoracentesis  Consultations:  Pulmonary  Discharge Exam: Filed Vitals:   10/01/13 0611  BP: 117/71  Pulse: 72  Temp: 98.4 F (36.9 C)  Resp: 18    General: Alert and orientated.  Sitting up typing on lap top.  Pale complexion, appears chronically ill. Cardiovascular: rrr, no m/r/g Respiratory: cta no w/c/r.  Decreased BS in lower right lung Abdomen:  Soft, mildly distended, nt, +bs Extremities:  No edema, ambulates well  Discharge Instructions  Discharge Instructions   Diet - low sodium heart healthy    Complete by:  As directed  Low sodium diet.  1000 ml fluid restriction daily until changed by your physician.     Increase activity slowly    Complete by:  As directed           Discharge Medication List as of 10/01/2013 12:55 PM    CONTINUE these medications which have CHANGED   Details  docusate sodium (COLACE) 100 MG capsule Take  1 capsule (100 mg total) by mouth 2 (two) times daily as needed for mild constipation. Hold if you have diarrhea., Starting 10/01/2013, Until Discontinued, Print    furosemide (LASIX) 40 MG tablet Take 2 tablets (80 mg total) by mouth 2 (two) times daily., Starting 10/01/2013, Until Discontinued, Print    lactulose (CHRONULAC) 10 GM/15ML solution Take 45 mLs (30 g total) by mouth daily., Starting 10/01/2013, Until Discontinued, Print      CONTINUE these medications which have NOT CHANGED   Details  albuterol (PROVENTIL HFA;VENTOLIN HFA) 108 (90 BASE) MCG/ACT inhaler Inhale 2 puffs into the lungs every 6 (six) hours as needed for wheezing or shortness of breath., Until Discontinued, Historical Med    folic acid (FOLVITE) 1 MG tablet Take 1 tablet (1 mg total) by mouth daily., Starting 09/09/2013, Until Discontinued, Print    Multiple Vitamin (MULTIVITAMIN WITH MINERALS) TABS tablet Take 1 tablet by mouth daily., Starting 09/09/2013, Until Discontinued, Historical Med    neomycin-bacitracin-polymyxin (NEOSPORIN) OINT Apply 1 application topically as needed for wound care., Starting 09/23/2013, Until Discontinued, Print    nystatin ointment (MYCOSTATIN) Apply topically 2 (two) times daily., Starting 09/23/2013, Until Discontinued, Print    oxyCODONE (ROXICODONE) 5 MG immediate release tablet Take 1 tablet (5 mg total) by mouth every 4 (four) hours as needed for severe pain., Starting 09/23/2013, Until Discontinued, Print    tamsulosin (FLOMAX) 0.4 MG CAPS capsule Take 1 capsule (0.4 mg total) by mouth daily., Starting 09/23/2013, Until Discontinued, Print    thiamine 100 MG tablet Take 1 tablet (100 mg total) by mouth daily., Starting 09/09/2013, Until Discontinued, Print      STOP taking these medications     spironolactone (ALDACTONE) 100 MG tablet        Allergies  Allergen Reactions  . Bee Venom Swelling   Follow-up Information   Follow up with Xavier Cheadle, MD In 5 days. (Please see your  physician for lab work and follow up of electrolytes.)    Specialty:  Internal Medicine   Contact information:   [address] [place] [address] [phone]        The results of significant diagnostics from this hospitalization (including imaging, microbiology, ancillary and laboratory) are listed below for reference.    Significant Diagnostic Studies: Dg Chest 2 View  09/29/2013   CLINICAL DATA:  Right pleural effusion  EXAM: CHEST  2 VIEW  COMPARISON:  09/27/2013  FINDINGS: There is a large right pleural effusion unchanged from the prior exam. There is no left pleural effusion. There is no focal parenchymal opacity. There is no pneumothorax. The heart and mediastinal contours are unremarkable.  The osseous structures are unremarkable.  IMPRESSION: Large right pleural effusion unchanged from 09/27/2013.   Electronically Signed   By: Elige Ko   On: 09/29/2013 13:17   Dg Chest 2 View  09/27/2013   CLINICAL DATA:  Cough, congestion, fever, shortness of breath, right-sided chest pain. Pneumonia last week.  EXAM: CHEST  2 VIEW  COMPARISON:  09/21/2013  FINDINGS: Significant increase in right pleural effusion and basilar atelectasis/ consolidation since  previous study. Now also blunting of left costophrenic angle. Normal heart size and pulmonary vascularity. Left lung is clear.  IMPRESSION: Increasing size of right pleural effusion and right basilar atelectasis/ consolidation since previous study.   Electronically Signed   By: Burman Nieves M.D.   On: 09/27/2013 05:11   Dg Chest 2 View  09/08/2013   CLINICAL DATA:  Lower abdominal pain.  Hypoxia.  EXAM: CHEST  2 VIEW  COMPARISON:  09/02/2013 and 08/30/2013.  FINDINGS: Trachea is midline. Heart size is grossly stable. Interval improvement in bilateral airspace disease with residual subsegmental atelectasis in the left midlung zone. Small bilateral effusions.  IMPRESSION: Resolved pulmonary edema with left perihilar atelectasis and  residual small bilateral effusions.   Electronically Signed   By: Leanna Battles M.D.   On: 09/08/2013 10:58   US Paracentesis  09/21/2013   INDICATION: Symptomatic abdominal ascites. History of cirrhosis. Please perform diagnostic and therapeutic paracentesis.  EXAM: ULTRASOUND-GUIDED PARACENTESIS  COMPARISON:  CT the abdomen pelvis - 08/28/2013; pelvic CT -09/08/2013  MEDICATIONS: None.  COMPLICATIONS: None immediate  TECHNIQUE: Informed written consent was obtained from the patient after a discussion of the risks, benefits and alternatives to treatment. A timeout was performed prior to the initiation of the procedure.  Initial ultrasound scanning demonstrates a moderate amount of ascites within the right lower abdominal quadrant. The right lower abdomen was prepped and draped in the usual sterile fashion. 1% lidocaine with epinephrine was used for local anesthesia. Under direct ultrasound guidance, a 19 gauge, 7-cm, Yueh catheter was introduced. An ultrasound image was saved for documentation purposed. The paracentesis was performed. The catheter was removed and a dressing was applied. The patient tolerated the procedure well without immediate post procedural complication.  FINDINGS: A total of approximately 3.6 liters of serous, yellow tinged fluid was removed. Samples were sent to the laboratory as requested by the clinical team.  IMPRESSION: Successful ultrasound-guided paracentesis yielding 3.6 liters of peritoneal fluid.   Electronically Signed   By: Simonne Come M.D.   On: 09/21/2013 12:24   Ct Pelvis Limited W/o Cm  09/08/2013   CLINICAL DATA:  Scrotal swelling. Right inguinal hernia repair. Incisional bleeding after right inguinal hernia repair. Postoperative complication due to coagulopathy and cirrhosis.  EXAM: CT PELVIS WITHOUT CONTRAST  TECHNIQUE: Contiguous axial CT images were obtained from the iliac crests through the proximal femurs to include the scrotum and entire pelvic contents.  Multiplanar reformatting was performed in the coronal and sagittal planes. This is NOT a limited exam, and in fact is protocoled to extend well further in the upper legs than a normal pelvic CT.  COMPARISON:  08/28/2013  FINDINGS: Prominent pelvic ascites. Diffuse subcutaneous edema compatible with third spacing of fluid. Mesenteric edema. Sigmoid diverticulosis. Urinary bladder unremarkable.  Right external iliac lymph node short axis diameter 1.3 cm. Clustered additional iliac and inguinal lymph nodes are present bilaterally.  Faintly visible postoperative findings from inguinal hernia repair. Very minimally hyperdense fluid tracks along the inguinal rain and surrounds the right spermatic cord, extending down into the scrotum. I can't really see the inferior epigastric vessels all that well to be able to characterize the hernia further. The right spermatic cord itself appears edematous and expanded compared to the left. This fluid collection deviates the base of the penis to the left. Scrotal skin swelling compatible with the generalized third spacing of fluid.  IMPRESSION: 1. Slightly complex fluid collection tracks from the right inguinal ring around the edematous right spermatic cord  and into the scrotum. Given that this large hydrocele measures slightly above the density of the extensive ascites in the pelvis, it may well have some involving blood products. If there is concern for testicular torsion then scrotal Doppler would be recommended. 2. Extensive third spacing of fluid with prominent ascites, subcutaneous edema, mesenteric edema, and scrotal skin swelling. 3. Mildly enlarged external iliac and inguinal lymph nodes could be reactive, neoplastic, or due to passive lymphatic congestion.   Electronically Signed   By: Herbie BaltimoreWalt  Liebkemann M.D.   On: 09/08/2013 14:02   Dg Chest Port 1 View  09/27/2013   CLINICAL DATA:  Status post thoracentesis  EXAM: PORTABLE CHEST - 1 VIEW  COMPARISON:  09/21/2013   FINDINGS: Large right pleural effusion. No left pleural effusion. No pneumothorax. Stable cardiomediastinal silhouette. Unremarkable osseous structures.  IMPRESSION: Large right pleural effusion.  No pneumothorax.   Electronically Signed   By: Elige KoHetal  Patel   On: 09/27/2013 19:07   Dg Chest Port 1 View  09/02/2013   CLINICAL DATA:  Shortness of breath  EXAM: PORTABLE CHEST - 1 VIEW  COMPARISON:  08/30/2013  FINDINGS: Prominent cardiomediastinal contours. Central vascular congestion. Perihilar interstitial and hazy airspace opacities. Left pleural effusion has increased and predominantly collects along the periphery of the left hemithorax. Associated airspace consolidation. No pneumothorax. No interval osseous change.  IMPRESSION: Increased central vascular congestion and perihilar interstitial and airspace opacities, favor pulmonary edema.  Increase left pleural effusion and associated airspace consolidation; atelectasis versus pneumonia.   Electronically Signed   By: Jearld LeschAndrew  DelGaizo M.D.   On: 09/02/2013 00:49   Dg Abd Acute W/chest  09/21/2013   CLINICAL DATA:  [facility]. Persistent infections since. Abdominal pain and distention. History of cirrhosis.  EXAM: ACUTE ABDOMEN SERIES (ABDOMEN 2 VIEW & CHEST 1 VIEW)  COMPARISON:  CT abdomen pelvis - 09/08/2013 ; CT abdomen pelvis - 08/28/2013 chest radiograph - 09/08/2013; 09/02/2013  FINDINGS: Grossly unchanged borderline enlarged cardiac silhouette and mediastinal contours. Interval increase and now likely moderate size right-sided effusion. Unchanged small left-sided effusion. Mild cephalization of flow without frank evidence of edema.  Paucity of bowel gas without definite evidence of obstruction. There is nonspecific mild gaseous distention of a rather featureless loop of small bowel within the mid abdomen measuring approximately 3.7 cm in diameter, nonspecific in the setting of patient's known ascites. There is a  minimal amount of gas seen within the descending colon. No pneumoperitoneum, pneumatosis or portal venous gas.  No acute osseus abnormalities.  IMPRESSION: 1. Interval increase in suspected moderate sized right-sided effusion. 2. Unchanged small left-sided effusion. 3. Pulmonary venous congestion without frank evidence of edema. 4. Paucity of bowel gas without definite evidence of obstruction.   Electronically Signed   By: Simonne ComeJohn  Watts M.D.   On: 09/21/2013 09:02    Microbiology: Recent Results (from the past 240 hour(s))  BODY FLUID CULTURE     Status: None   Collection Time    09/27/13  6:25 PM      Result Value Ref Range Status   Specimen Description PLEURAL RIGHT FLUID   Final   Special Requests NONE   Final   Gram Stain     Final   Value: FEW WBC PRESENT, PREDOMINANTLY MONONUCLEAR     NO ORGANISMS SEEN     Performed at [facility]   Culture     Final   Value: NO GROWTH 3 DAYS  Performed at [facility]   Report Status 10/01/2013 FINAL   Final     Labs: Basic Metabolic Panel:  Recent Labs Lab 09/29/13 0906 09/29/13 1301 09/29/13 1955 09/30/13 0710 10/01/13 0701  NA 120* 123* 119* 123* 126*  K 4.3 4.2 4.1 4.0 4.5  CL 83* 84* 84* 86* 89*  CO2 27 30 24 26 25   GLUCOSE 121* 100* 88 78 75  BUN 18 18 19 18 19   CREATININE 0.77 0.77 0.74 0.69 0.81  CALCIUM 7.9* 8.4 7.6* 8.0* 8.3*   Liver Function Tests:  Recent Labs Lab 09/27/13 0521 09/27/13 1925 09/28/13 0626  AST 51*  --  43*  ALT 32  --  28  ALKPHOS 89  --  69  BILITOT 3.0*  --  3.9*  PROT 7.6 6.7 6.6  ALBUMIN 2.3*  --  1.9*    Recent Labs Lab 09/28/13 0626  AMMONIA 58   CBC:  Recent Labs Lab 09/27/13 0521 09/28/13 0626  WBC 9.4 10.8*  NEUTROABS 6.8  --   HGB 11.7* 10.8*  HCT 33.9* 30.7*  MCV 93.4 90.0  PLT 93* 74*    BNP (last 3 results)  Recent Labs  08/28/13 1357 09/02/13 0520 09/09/13 0430  PROBNP 739.8* 348.5* 383.9*    Signed:  Stephani Police, PA-C  [facility] 10/01/2013, 5:50 PM

## 2013-10-02 LAB — ANA, BODY FLUID: Anti-Nuclear Ab, IgG: NOT DETECTED

## 2013-10-02 NOTE — Discharge Summary (Signed)
Addendum  Patient seen and examined, chart and data base reviewed.  I agree with the above assessment and plan.  For full details please see Mrs. Algis DownsMarianne York PA note.  Right pleural effusion, likely secondary to his cirrhosis, s/p thoracentesis.   Hyponatremia and cirrhosis.    Clint LippsMutaz A Nyal Schachter, MD [facility] Pager: [phone] 10/02/2013, 12:30 PM

## 2013-10-03 LAB — RHEUMATOID FACTORS, FLUID: Cortisol #1 (Base): NEGATIVE

## 2013-10-06 ENCOUNTER — Emergency Department (HOSPITAL_COMMUNITY): Payer: Medicaid - Out of State

## 2013-10-06 ENCOUNTER — Emergency Department (HOSPITAL_COMMUNITY)
Admission: EM | Admit: 2013-10-06 | Discharge: 2013-10-06 | Disposition: A | Discharge status: Home / Self Care | Payer: Medicaid - Out of State | Attending: Emergency Medicine | Admitting: Emergency Medicine

## 2013-10-06 ENCOUNTER — Telehealth: Payer: Self-pay | Admitting: Internal Medicine

## 2013-10-06 ENCOUNTER — Encounter (HOSPITAL_COMMUNITY): Payer: Self-pay | Admitting: Emergency Medicine

## 2013-10-06 DIAGNOSIS — Z8701 Personal history of pneumonia (recurrent): Secondary | ICD-10-CM | POA: Diagnosis not present

## 2013-10-06 DIAGNOSIS — D696 Thrombocytopenia, unspecified: Secondary | ICD-10-CM | POA: Diagnosis not present

## 2013-10-06 DIAGNOSIS — Z8619 Personal history of other infectious and parasitic diseases: Secondary | ICD-10-CM | POA: Insufficient documentation

## 2013-10-06 DIAGNOSIS — Z8719 Personal history of other diseases of the digestive system: Secondary | ICD-10-CM | POA: Insufficient documentation

## 2013-10-06 DIAGNOSIS — Z79899 Other long term (current) drug therapy: Secondary | ICD-10-CM | POA: Insufficient documentation

## 2013-10-06 DIAGNOSIS — Z87891 Personal history of nicotine dependence: Secondary | ICD-10-CM | POA: Insufficient documentation

## 2013-10-06 DIAGNOSIS — F419 Anxiety disorder, unspecified: Secondary | ICD-10-CM | POA: Diagnosis not present

## 2013-10-06 DIAGNOSIS — Z9889 Other specified postprocedural states: Secondary | ICD-10-CM | POA: Insufficient documentation

## 2013-10-06 DIAGNOSIS — R140 Abdominal distension (gaseous): Secondary | ICD-10-CM | POA: Insufficient documentation

## 2013-10-06 LAB — COMPREHENSIVE METABOLIC PANEL
ALT: 44 U/L (ref 0–53)
AST: 63 U/L — ABNORMAL HIGH (ref 0–37)
Albumin: 2.2 g/dL — ABNORMAL LOW (ref 3.5–5.2)
Alkaline Phosphatase: 99 U/L (ref 39–117)
Anion gap: 9 (ref 5–15)
BUN: 21 mg/dL (ref 6–23)
CO2: 26 mEq/L (ref 19–32)
Calcium: 8.2 mg/dL — ABNORMAL LOW (ref 8.4–10.5)
Chloride: 94 mEq/L — ABNORMAL LOW (ref 96–112)
Creatinine, Ser: 0.84 mg/dL (ref 0.50–1.35)
GFR calc Af Amer: >90 mL/min (ref >90)
GFR calc non Af Amer: >90 mL/min (ref >90)
Glucose, Bld: 88 mg/dL (ref 70–99)
Potassium: 4.3 mEq/L (ref 3.7–5.3)
Sodium: 129 mEq/L — ABNORMAL LOW (ref 137–147)
Total Bilirubin: 3.8 mg/dL — ABNORMAL HIGH (ref 0.3–1.2)
Total Protein: 7.7 g/dL (ref 6.0–8.3)

## 2013-10-06 LAB — CBC WITH DIFFERENTIAL/PLATELET
Basophils Absolute: 0 10*3/uL (ref 0.0–0.1)
Basophils Relative: 0 % (ref 0–1)
Eosinophils Absolute: 0.3 10*3/uL (ref 0.0–0.7)
Eosinophils Relative: 3 % (ref 0–5)
HCT: 36.6 % — ABNORMAL LOW (ref 39.0–52.0)
Hemoglobin: 12.7 g/dL — ABNORMAL LOW (ref 13.0–17.0)
Lymphocytes Relative: 25 % (ref 12–46)
Lymphs Abs: 2.1 10*3/uL (ref 0.7–4.0)
MCH: 31.8 pg (ref 26.0–34.0)
MCHC: 34.7 g/dL (ref 30.0–36.0)
MCV: 91.5 fL (ref 78.0–100.0)
Monocytes Absolute: 0.8 10*3/uL (ref 0.1–1.0)
Monocytes Relative: 10 % (ref 3–12)
Neutro Abs: 5.2 10*3/uL (ref 1.7–7.7)
Neutrophils Relative %: 62 % (ref 43–77)
Platelets: 91 10*3/uL — ABNORMAL LOW (ref 150–400)
RBC: 4 MIL/uL — ABNORMAL LOW (ref 4.22–5.81)
RDW: 14.8 % (ref 11.5–15.5)
WBC: 8.4 10*3/uL (ref 4.0–10.5)

## 2013-10-06 LAB — PROTIME-INR
INR: 1.95 — ABNORMAL HIGH (ref 0.00–1.49)
Prothrombin Time: 22.2 seconds — ABNORMAL HIGH (ref 11.6–15.2)

## 2013-10-06 LAB — URINALYSIS, ROUTINE W REFLEX MICROSCOPIC
Bilirubin Urine: NEGATIVE
Glucose, UA: NEGATIVE mg/dL
Hgb urine dipstick: NEGATIVE
Ketones, ur: NEGATIVE mg/dL
Leukocytes, UA: NEGATIVE
Nitrite: NEGATIVE
Protein, ur: NEGATIVE mg/dL
Specific Gravity, Urine: 1.019 (ref 1.005–1.030)
Urobilinogen, UA: 0.2 mg/dL (ref 0.0–1.0)
pH: 5.5 (ref 5.0–8.0)

## 2013-10-06 NOTE — ED Notes (Signed)
Dr. Juleen ChinaKohut made aware of pt pending concern for the 8lb weight gain.  MD to speak to patient prior to discharge.

## 2013-10-06 NOTE — ED Notes (Addendum)
Recent admission. Recent pleural effusion and hernia surgery. Recent ED visit. Returns for continued sx. C/o feeling bloated, also 8 lbs wt gain in less than 48 hrs.aleert, NAD, calm, interactive, resps e/u, speaking in clear complete sentences. No dyspnea noted. Has tried laxative with some improvement. Last BM prior to triage in w/r (little). 3BM in last hr since laxative started working.

## 2013-10-06 NOTE — ED Notes (Signed)
Pt back from x-ray.

## 2013-10-06 NOTE — ED Notes (Signed)
Pt to xray at this time.

## 2013-10-06 NOTE — Discharge Instructions (Signed)
Bloating Bloating is the feeling of fullness in your belly. You may feel as though your pants are too tight. Often the cause of bloating is overeating, retaining fluids, or having gas in your bowel. It is also caused by swallowing air and eating foods that cause gas. Irritable bowel syndrome is one of the most common causes of bloating. Constipation is also a common cause. Sometimes more serious problems can cause bloating. SYMPTOMS  Usually there is a feeling of fullness, as though your abdomen is bulged out. There may be mild discomfort.  DIAGNOSIS  Usually no particular testing is necessary for most bloating. If the condition persists and seems to become worse, your caregiver may do additional testing.  TREATMENT   There is no direct treatment for bloating.  Do not put gas into the bowel. Avoid chewing gum and sucking on candy. These tend to make you swallow air. Swallowing air can also be a nervous habit. Try to avoid this.  Avoiding high residue diets will help. Eat foods with soluble fibers (examples include root vegetables, apples, or barley) and substitute dairy products with soy and rice products. This helps irritable bowel syndrome.  If constipation is the cause, then a high residue diet with more fiber will help.  Avoid carbonated beverages.  Over-the-counter preparations are available that help reduce gas. Your pharmacist can help you with this. SEEK MEDICAL CARE IF:   Bloating continues and seems to be getting worse.  You notice a weight gain.  You have a weight loss but the bloating is getting worse.  You have changes in your bowel habits or develop nausea or vomiting. SEEK IMMEDIATE MEDICAL CARE IF:   You develop shortness of breath or swelling in your legs.  You have an increase in abdominal pain or develop chest pain. Document Released: 10/19/2005 Document Revised: 03/13/2011 Document Reviewed: 12/07/2006 Exit[employer] Patient Information 2015 [employer]. This  information is not intended to replace advice given to you by your health care provider. Make sure you discuss any questions you have with your health care provider.  

## 2013-10-06 NOTE — Telephone Encounter (Signed)
Pt. Called stating that he went to the ED on 10/06/2013 and would like to see the Dr. To clarify some medication instructions. Pt. Would also like to see the Dr. To follow up from the ED visit. Please f/u with pt.

## 2013-10-07 ENCOUNTER — Encounter (HOSPITAL_COMMUNITY): Payer: Self-pay | Admitting: General Practice

## 2013-10-07 ENCOUNTER — Emergency Department (HOSPITAL_COMMUNITY): Payer: Medicaid - Out of State

## 2013-10-07 ENCOUNTER — Inpatient Hospital Stay (HOSPITAL_COMMUNITY)
Admission: EM | Admit: 2013-10-07 | Discharge: 2013-10-10 | DRG: 433 | Disposition: A | Discharge status: Home / Self Care | Payer: Medicaid - Out of State | Attending: Internal Medicine | Admitting: Internal Medicine

## 2013-10-07 DIAGNOSIS — F1020 Alcohol dependence, uncomplicated: Secondary | ICD-10-CM | POA: Diagnosis present

## 2013-10-07 DIAGNOSIS — R188 Other ascites: Secondary | ICD-10-CM

## 2013-10-07 DIAGNOSIS — K7031 Alcoholic cirrhosis of liver with ascites: Secondary | ICD-10-CM | POA: Diagnosis present

## 2013-10-07 DIAGNOSIS — Z833 Family history of diabetes mellitus: Secondary | ICD-10-CM

## 2013-10-07 DIAGNOSIS — J918 Pleural effusion in other conditions classified elsewhere: Secondary | ICD-10-CM | POA: Diagnosis present

## 2013-10-07 DIAGNOSIS — Z87891 Personal history of nicotine dependence: Secondary | ICD-10-CM

## 2013-10-07 DIAGNOSIS — K7040 Alcoholic hepatic failure without coma: Secondary | ICD-10-CM | POA: Diagnosis present

## 2013-10-07 DIAGNOSIS — J948 Other specified pleural conditions: Secondary | ICD-10-CM | POA: Diagnosis present

## 2013-10-07 DIAGNOSIS — D6959 Other secondary thrombocytopenia: Secondary | ICD-10-CM | POA: Diagnosis present

## 2013-10-07 DIAGNOSIS — I4581 Long QT syndrome: Secondary | ICD-10-CM | POA: Diagnosis present

## 2013-10-07 DIAGNOSIS — J90 Pleural effusion, not elsewhere classified: Secondary | ICD-10-CM

## 2013-10-07 DIAGNOSIS — D696 Thrombocytopenia, unspecified: Secondary | ICD-10-CM

## 2013-10-07 DIAGNOSIS — B1920 Unspecified viral hepatitis C without hepatic coma: Secondary | ICD-10-CM | POA: Diagnosis present

## 2013-10-07 DIAGNOSIS — J189 Pneumonia, unspecified organism: Secondary | ICD-10-CM

## 2013-10-07 DIAGNOSIS — D72829 Elevated white blood cell count, unspecified: Secondary | ICD-10-CM | POA: Diagnosis present

## 2013-10-07 DIAGNOSIS — F1290 Cannabis use, unspecified, uncomplicated: Secondary | ICD-10-CM | POA: Diagnosis present

## 2013-10-07 DIAGNOSIS — Z9114 Patient's other noncompliance with medication regimen: Secondary | ICD-10-CM

## 2013-10-07 DIAGNOSIS — R6520 Severe sepsis without septic shock: Secondary | ICD-10-CM

## 2013-10-07 DIAGNOSIS — D62 Acute posthemorrhagic anemia: Secondary | ICD-10-CM

## 2013-10-07 DIAGNOSIS — D684 Acquired coagulation factor deficiency: Secondary | ICD-10-CM | POA: Diagnosis present

## 2013-10-07 DIAGNOSIS — D689 Coagulation defect, unspecified: Secondary | ICD-10-CM

## 2013-10-07 DIAGNOSIS — F1490 Cocaine use, unspecified, uncomplicated: Secondary | ICD-10-CM | POA: Diagnosis present

## 2013-10-07 DIAGNOSIS — K7290 Hepatic failure, unspecified without coma: Secondary | ICD-10-CM

## 2013-10-07 DIAGNOSIS — E871 Hypo-osmolality and hyponatremia: Secondary | ICD-10-CM

## 2013-10-07 DIAGNOSIS — F1011 Alcohol abuse, in remission: Secondary | ICD-10-CM

## 2013-10-07 DIAGNOSIS — A419 Sepsis, unspecified organism: Secondary | ICD-10-CM

## 2013-10-07 DIAGNOSIS — Z91148 Patient's other noncompliance with medication regimen for other reason: Secondary | ICD-10-CM

## 2013-10-07 DIAGNOSIS — N400 Enlarged prostate without lower urinary tract symptoms: Secondary | ICD-10-CM | POA: Diagnosis present

## 2013-10-07 DIAGNOSIS — K7682 Hepatic encephalopathy: Secondary | ICD-10-CM

## 2013-10-07 DIAGNOSIS — T148XXA Other injury of unspecified body region, initial encounter: Secondary | ICD-10-CM

## 2013-10-07 HISTORY — DX: Alcohol dependence, uncomplicated: F10.20

## 2013-10-07 HISTORY — DX: Pneumonia, unspecified organism: J18.9

## 2013-10-07 HISTORY — DX: Anxiety disorder, unspecified: F41.9

## 2013-10-07 LAB — URINALYSIS, ROUTINE W REFLEX MICROSCOPIC
Bilirubin Urine: NEGATIVE
Glucose, UA: NEGATIVE mg/dL
Hgb urine dipstick: NEGATIVE
Ketones, ur: NEGATIVE mg/dL
Leukocytes, UA: NEGATIVE
Nitrite: NEGATIVE
Protein, ur: NEGATIVE mg/dL
Specific Gravity, Urine: 1.018 (ref 1.005–1.030)
Urobilinogen, UA: 0.2 mg/dL (ref 0.0–1.0)
pH: 5.5 (ref 5.0–8.0)

## 2013-10-07 LAB — BASIC METABOLIC PANEL
Anion gap: 12 (ref 5–15)
BUN: 21 mg/dL (ref 6–23)
CO2: 22 mEq/L (ref 19–32)
Calcium: 8.4 mg/dL (ref 8.4–10.5)
Chloride: 91 mEq/L — ABNORMAL LOW (ref 96–112)
Creatinine, Ser: 0.9 mg/dL (ref 0.50–1.35)
GFR calc non Af Amer: >90 mL/min (ref >90)
Glucose, Bld: 104 mg/dL — ABNORMAL HIGH (ref 70–99)
Potassium: 4.1 mEq/L (ref 3.7–5.3)
SODIUM: 125 meq/L — AB (ref 137–147)

## 2013-10-07 LAB — MAGNESIUM: MAGNESIUM: 1.8 mg/dL (ref 1.5–2.5)

## 2013-10-07 LAB — PRO B NATRIURETIC PEPTIDE: PRO B NATRI PEPTIDE: 241.7 pg/mL — AB (ref 0–125)

## 2013-10-07 LAB — I-STAT TROPONIN, ED: Troponin i, poc: 0.01 ng/mL (ref 0.00–0.08)

## 2013-10-07 LAB — CBC
HCT: 37 % — ABNORMAL LOW (ref 39.0–52.0)
Hemoglobin: 13.1 g/dL (ref 13.0–17.0)
MCH: 31.8 pg (ref 26.0–34.0)
MCHC: 35.4 g/dL (ref 30.0–36.0)
MCV: 89.8 fL (ref 78.0–100.0)
Platelets: 84 10*3/uL — ABNORMAL LOW (ref 150–400)
RBC: 4.12 MIL/uL — ABNORMAL LOW (ref 4.22–5.81)
RDW: 14.9 % (ref 11.5–15.5)
WBC: 21.1 10*3/uL — ABNORMAL HIGH (ref 4.0–10.5)

## 2013-10-07 LAB — MRSA PCR SCREENING: MRSA by PCR: POSITIVE — AB

## 2013-10-07 LAB — PROTIME-INR
INR: 1.95 — ABNORMAL HIGH (ref 0.00–1.49)
Prothrombin Time: 22.2 seconds — ABNORMAL HIGH (ref 11.6–15.2)

## 2013-10-07 MED ORDER — SODIUM CHLORIDE 0.9 % IJ SOLN
3.0000 mL | Freq: Two times a day (BID) | INTRAMUSCULAR | Status: DC
  Administered 2013-10-08 – 2013-10-10 (×2): 3 mL via INTRAVENOUS

## 2013-10-07 MED ORDER — INFLUENZA VAC SPLIT QUAD 0.5 ML IM SUSY
0.5000 mL | PREFILLED_SYRINGE | INTRAMUSCULAR | Status: AC
  Administered 2013-10-08: 0.5 mL via INTRAMUSCULAR
  Filled 2013-10-07: qty 0.5

## 2013-10-07 MED ORDER — HYDROMORPHONE HCL 1 MG/ML IJ SOLN
1.0000 mg | INTRAMUSCULAR | Status: DC | PRN
  Administered 2013-10-07 – 2013-10-10 (×13): 1 mg via INTRAVENOUS
  Filled 2013-10-07 (×13): qty 1

## 2013-10-07 MED ORDER — ONDANSETRON HCL 4 MG PO TABS: 4.0000 mg | ORAL_TABLET | Freq: Four times a day (QID) | ORAL | Status: DC | PRN

## 2013-10-07 MED ORDER — SODIUM CHLORIDE 0.9 % IV SOLN
250.0000 mL | INTRAVENOUS | Status: DC | PRN
Start: 2013-10-07 — End: 2013-10-10

## 2013-10-07 MED ORDER — LACTULOSE 10 GM/15ML PO SOLN
30.0000 g | Freq: Every day | ORAL | Status: DC
  Administered 2013-10-08 – 2013-10-10 (×3): 30 g via ORAL
  Filled 2013-10-07 (×3): qty 45

## 2013-10-07 MED ORDER — DEXTROSE 5 % IV SOLN
2.0000 g | INTRAVENOUS | Status: DC
  Administered 2013-10-07 – 2013-10-09 (×3): 2 g via INTRAVENOUS
  Filled 2013-10-07 (×4): qty 2

## 2013-10-07 MED ORDER — HYDROMORPHONE HCL 1 MG/ML IJ SOLN
1.0000 mg | Freq: Once | INTRAMUSCULAR | Status: AC
  Administered 2013-10-07: 1 mg via INTRAVENOUS
  Filled 2013-10-07: qty 1

## 2013-10-07 MED ORDER — SPIRONOLACTONE 100 MG PO TABS
100.0000 mg | ORAL_TABLET | Freq: Two times a day (BID) | ORAL | Status: DC
  Administered 2013-10-08 – 2013-10-10 (×5): 100 mg via ORAL
  Filled 2013-10-07 (×7): qty 1

## 2013-10-07 MED ORDER — ADULT MULTIVITAMIN W/MINERALS CH
1.0000 | ORAL_TABLET | Freq: Every day | ORAL | Status: DC
  Administered 2013-10-08 – 2013-10-10 (×3): 1 via ORAL
  Filled 2013-10-07 (×3): qty 1

## 2013-10-07 MED ORDER — OXYCODONE HCL 5 MG PO TABS
5.0000 mg | ORAL_TABLET | ORAL | Status: DC | PRN
  Administered 2013-10-07 – 2013-10-10 (×3): 5 mg via ORAL
  Filled 2013-10-07 (×4): qty 1

## 2013-10-07 MED ORDER — ONDANSETRON HCL 4 MG/2ML IJ SOLN: 4.0000 mg | Freq: Four times a day (QID) | INTRAMUSCULAR | Status: DC | PRN

## 2013-10-07 MED ORDER — MORPHINE SULFATE 2 MG/ML IJ SOLN
1.0000 mg | INTRAMUSCULAR | Status: DC | PRN
  Administered 2013-10-07: 1 mg via INTRAVENOUS
  Filled 2013-10-07: qty 1

## 2013-10-07 MED ORDER — VITAMIN B-1 100 MG PO TABS
100.0000 mg | ORAL_TABLET | Freq: Every day | ORAL | Status: DC
  Administered 2013-10-08 – 2013-10-10 (×3): 100 mg via ORAL
  Filled 2013-10-07 (×3): qty 1

## 2013-10-07 MED ORDER — FUROSEMIDE 80 MG PO TABS
80.0000 mg | ORAL_TABLET | Freq: Two times a day (BID) | ORAL | Status: DC
  Administered 2013-10-07 – 2013-10-10 (×6): 80 mg via ORAL
  Filled 2013-10-07 (×9): qty 1

## 2013-10-07 MED ORDER — FOLIC ACID 1 MG PO TABS
1.0000 mg | ORAL_TABLET | Freq: Every day | ORAL | Status: DC
  Administered 2013-10-08 – 2013-10-10 (×3): 1 mg via ORAL
  Filled 2013-10-07 (×3): qty 1

## 2013-10-07 MED ORDER — TAMSULOSIN HCL 0.4 MG PO CAPS
0.4000 mg | ORAL_CAPSULE | Freq: Every day | ORAL | Status: DC
  Administered 2013-10-08 – 2013-10-10 (×3): 0.4 mg via ORAL
  Filled 2013-10-07 (×3): qty 1

## 2013-10-07 MED ORDER — SODIUM CHLORIDE 0.9 % IJ SOLN: 3.0000 mL | INTRAMUSCULAR | Status: DC | PRN

## 2013-10-07 NOTE — ED Notes (Signed)
MD at bedside. 

## 2013-10-07 NOTE — ED Notes (Signed)
Pt returned from xray

## 2013-10-07 NOTE — Consult Note (Signed)
Name: Xavier DuskyRichard L Ludvigsen MRN: [id number] DOB: 27-Dec-1960    ADMISSION DATE:  10/07/2013 CONSULTATION DATE:  10/07/2013  REFERRING MD :  Dr. Sunnie Nielsenegalado  CHIEF COMPLAINT:  R chest pain  BRIEF PATIENT DESCRIPTION: 53yo male with severe alcoholic cirrhosis [facility] 10[facility]6 c/o R sided chest pain, abd pain, and SOB. CXR in ED showed large R pleural effusion. He was admitted to the hospitalist team. PCCM has been consulted to evaluate for possible thoracentesis.   SIGNIFICANT EVENTS  9/20 paracentesis with 3.6 liters yellow tinged fluid removed. 9/26 thoracentesis  - transudate, hepatic hydrothorax 10/5 to ED with abd distention, improved with BM and discharged 10/6 to ED with SOB and chest pain, admitted with effusion.   STUDIES:   HISTORY OF PRESENT ILLNESS:  53 year old male with PMH as below, which includes alcoholic cirrhosis, thrombocytopenia, and hepatitis c. [facility] c/o abdominal distention, which improved with BM so he was discharged home. Large R effusion was noted on CXR. 10/6 he again presented to ED with abdominal pain, but also SOB and R chest pain. Effusion again noted on CXR, although was larger. He was admitted to the hospitalist team. PCCM consulted to evaluate for thoracentesis.    PAST MEDICAL HISTORY :   has a past medical history of Cirrhosis (08/2013); Ascites (09/2013); Thrombocytopenia (09/2013); Coagulopathy (09/2013); Pneumonia (09/2013); Chronic alcoholism; Hepatitis C (dx'd ~ 2000); Anxiety; and Inguinal hernia.  has past surgical history that includes Incision and drainage perirectal abscess (1980's) and Inguinal hernia repair (Right, 08/2013). Prior to Admission medications   Medication Sig Start Date End Date Taking? Authorizing Provider  docusate sodium (COLACE) 100 MG capsule Take 1 capsule (100 mg total) by mouth 2 (two) times daily as needed for mild constipation. Hold if you have diarrhea. 10/01/13  Yes Tora KindredMarianne L York, PA-C  folic acid  (FOLVITE) 1 MG tablet Take 1 tablet (1 mg total) by mouth daily. 09/09/13  Yes Clydia LlanoMutaz Elmahi, MD  furosemide (LASIX) 40 MG tablet Take 2 tablets (80 mg total) by mouth 2 (two) times daily. 10/01/13  Yes Marianne L York, PA-C  lactulose (CHRONULAC) 10 GM/15ML solution Take 45 mLs (30 g total) by mouth daily. 10/01/13  Yes Marianne L York, PA-C  Multiple Vitamin (MULTIVITAMIN WITH MINERALS) TABS tablet Take 1 tablet by mouth daily. 09/09/13  Yes Clydia LlanoMutaz Elmahi, MD  oxyCODONE (ROXICODONE) 5 MG immediate release tablet Take 1 tablet (5 mg total) by mouth every 4 (four) hours as needed for severe pain. 09/23/13  Yes Tora KindredMarianne L York, PA-C  tamsulosin (FLOMAX) 0.4 MG CAPS capsule Take 1 capsule (0.4 mg total) by mouth daily. 09/23/13  Yes Tora KindredMarianne L York, PA-C  thiamine 100 MG tablet Take 1 tablet (100 mg total) by mouth daily. 09/09/13  Yes Clydia LlanoMutaz Elmahi, MD   Allergies  Allergen Reactions  . Bee Venom Swelling    FAMILY HISTORY:  family history includes CAD in an other family member; Cancer in his father and maternal grandfather; Diabetes in his mother; Hypertension in his mother and another family member. SOCIAL HISTORY:  reports that he has quit smoking. His smoking use included Cigarettes. He smoked 0.00 packs per day for 10 years. He has never used smokeless tobacco. He reports that he drinks alcohol. He reports that he uses illicit drugs (Marijuana and Cocaine).  REVIEW OF SYSTEMS:   Constitutional: Negative for fever, chills, weight loss, malaise/fatigue and diaphoresis.  HENT: Negative for hearing loss, ear pain, nosebleeds, congestion,  sore throat, neck pain, tinnitus and ear discharge.   Eyes: Negative for blurred vision, double vision, photophobia, pain, discharge and redness.  Respiratory: Negative for cough, hemoptysis, sputum production, shortness of breath, wheezing and stridor.   Cardiovascular: Negative for chest pain, palpitations, orthopnea, claudication, leg swelling and PND.    Gastrointestinal: Negative for heartburn, nausea, vomiting, abdominal pain, diarrhea, constipation, blood in stool and melena.  Genitourinary: Negative for dysuria, urgency, frequency, hematuria and flank pain.  Musculoskeletal: Negative for myalgias, back pain, joint pain and falls.  Skin: Negative for itching and rash.  Neurological: Negative for dizziness, tingling, tremors, sensory change, speech change, focal weakness, seizures, loss of consciousness, weakness and headaches.  Endo/Heme/Allergies: Negative for environmental allergies and polydipsia. Does not bruise/bleed easily.  SUBJECTIVE: Stating that he feels SOB   VITAL SIGNS: Temp:  [97.9 F (36.6 C)-98.6 F (37 C)] 97.9 F (36.6 C) (10/06 1612) Pulse Rate:  [82-99] 90 (10/06 1612) Resp:  [16-21] 20 (10/06 1321) BP: (104-134)/(57-82) 120/78 mmHg (10/06 1612) SpO2:  [91 %-94 %] 93 % (10/06 1612) Weight:  [76.204 kg (168 lb)-80 kg (176 lb 5.9 oz)] 80 kg (176 lb 5.9 oz) (10/06 1612)  PHYSICAL EXAMINATION: General:  Thin male in NAD Neuro:  Alert, oriented, no focal deficit HEENT:  Boley/AT, PERRL, moderate JVD Cardiovascular:  RRR, no MRG Lungs:  Diminished R, unlabored Abdomen:  Moderate ascites, hyperactive BS, non-tender Musculoskeletal:  No acute deformity or ROM limitation Skin:  Jaundiced, intact.    Recent Labs Lab 10/01/13 0701 10/06/13 0220 10/07/13 1029  NA 126* 129* 125*  K 4.5 4.3 4.1  CL 89* 94* 91*  CO2 25 26 22   BUN 19 21 21   CREATININE 0.81 0.84 0.90  GLUCOSE 75 88 104*    Recent Labs Lab 10/06/13 0220 10/07/13 1029  HGB 12.7* 13.1  HCT 36.6* 37.0*  WBC 8.4 21.1*  PLT 91* 84*   Dg Chest 2 View  10/07/2013   CLINICAL DATA:  Shortness of breath. Chest pain. Followup right pleural effusion. Heart palpitations and chest tightness approximately 30 min after taking a new unspecified medication. Current history hepatitis-C.  EXAM: CHEST  2 VIEW  COMPARISON:  Two-view chest x-rays yesterday dating  back to 08/30/2013.  FINDINGS: Very large right pleural effusion with associated passive atelectasis involving much of the right lung, with aeration of the apex; the effusion has increased in size since yesterday. Left lung remains clear. Cardiac silhouette normal in size, unchanged. Thoracic aorta mildly atherosclerotic, unchanged. No visible left pleural effusion.  IMPRESSION: Interval increase in size of the very large right pleural effusion. Associated dense passive atelectasis involving much of the right lung with only residual aeration of the apex. Left lung remains clear.   Electronically Signed   By: Hulan Saas M.D.   On: 10/07/2013 13:23   Dg Chest 2 View  10/06/2013   CLINICAL DATA:  8 lb of weight gain in the past 2 days. Known large right-sided pleural effusion. Abdominal bloating and constipation, of acute onset.  EXAM: CHEST  2 VIEW  COMPARISON:  Chest radiograph performed 09/29/2013  FINDINGS: A large right-sided pleural effusion is again noted, mildly increased from the prior study, with associated atelectasis. The left lung appears clear. No pneumothorax is seen.  The heart is not well characterized due to the large right-sided pleural effusion. No acute osseous abnormalities are seen.  IMPRESSION: Large right-sided pleural effusion noted, mildly increased from the prior study, with associated atelectasis.   Electronically Signed  By: Roanna Raider M.D.   On: 10/06/2013 03:22    ASSESSMENT / PLAN: A: Pleural effusion  Hepatic hydrothorax  (hx of ETOH cirrhosis)  No acute dyspnea  Child Pugh 12 Prior home regimen includes: lactulose 30g daily, lasix 80mg  BID, thiamine 100mg ; reports compliance  P (as modified by staff MD): No acute indication for thoracentesis given lack of suspicion of spontaneous bacterial pleuritis or dyspnea Not currently on optimized regimen, will start spiro 100mg  BID, with goal of 400mg /day Salt restricted diet IF diuresis and low salt restriction do  not work, Pt would benefit from TIPS placement, with IR (goal Child Pugh <13 and is safe for TIPS for this indication)  Ultimately work towards liver transplant (TIPS should not be a contraindication for liver transplant but GI opinion is sought) No chest tube ever into this pleural space  Joneen Roach NP Pulmonary and Critical Care Medicine [facility] Pager: [phone] 10/07/2013, 7:37 PM    STAFF NOTE: I, Dr Lavinia Sharps have personally reviewed patient's available data, including medical history, events of note, physical examination and test results as part of my evaluation. I have discussed with resident/NP and other care providers such as pharmacist, RN and RRT.  In addition,  I personally evaluated patient and elicited key findings of - hepatic hydrothorax. He is not in acute dyspnea and there is no suspicion clinically for SBP; so no need for thora. His firsty answer to this fluid is max diuretics with low salt diet; noticed he is not on aldactone. We will start it. However, supect he will be refractory; if so he would meet indication for TIPS. HE does not have contraindication for TIPS (age is < 31, Child Pugh is < 13 at 49 and he does not have hepatic encephalopathy). He is ok with TIPS as long as it is not a contraindication for liver transplant; suspect is not. DO NOT EVER PUT CHEST TUBE into this PLEURAL EFFUSION. Marland Kitchen  Rest per NP/medical resident whose note is outlined above and that I agree with   Dr. Kalman Shan, M.D., [facility].C.P Pulmonary and Critical Care Medicine Staff Physician [facility] Pager: [phone], If no answer or between  15:00h - 7:00h: call [phone]  10/07/2013 10:43 PM          .

## 2013-10-07 NOTE — ED Notes (Signed)
Pt.check in .before being triage the patient.states he need to go to restroom to have bowelment.due to meds.

## 2013-10-07 NOTE — ED Provider Notes (Signed)
CSN: [id number]     Arrival date & time 10/07/13  1000 History   First MD Initiated Contact with Patient 10/07/13 1141     Chief Complaint  Patient presents with  . Shortness of Breath  . Chest Pain     (Consider location/radiation/quality/duration/timing/severity/associated sxs/prior Treatment) HPI Pt is a 53yo male with hx of chronic alcoholism, inguinal hernia repair in [place] 08/21/13, Hep C, cirrhosis, and recently admitted for ascites, thrombocytopenia and coagulopathy, presenting to ED with c/o right sided chest pain that started last night. Pt describes pain as sharp, aching and constant, 10/10 at worst. Described as muscles spasms in right side with trembling and shaking front and back of his chest last night.  Reports mild SOB. Denies fever, n/v/d. Denies centralized chest pain. Denies cough. Denies sick contacts or recent travel. Does report being seen yesterday for bloating, however, full ED note unable to be seen through [facility] system.  Pt states he has been on fluid pills and thinks they may be the cause of his pain.    Past Medical History  Diagnosis Date  . Alcoholism     chronic.   . Inguinal hernia     repaired in [place] 08/2013  . Hepatitis C 2000  . Perirectal abscess   . Cirrhosis 08/2013    ETOH and Hep C  . Ascites 09/2013  . Thrombocytopenia 09/2013  . Coagulopathy 09/2013    due to hepatic dysfunction.    Past Surgical History  Procedure Laterality Date  . Hernia repair  08/2013    at [facility] in [place].    Family History  Problem Relation Age of Onset  . CAD    . Hypertension    . Diabetes Mother   . Hypertension Mother   . Cancer Father   . Cancer Maternal Grandfather    History  Substance Use Topics  . Smoking status: Former Smoker    Types: Cigarettes    Quit date: 09/21/2008  . Smokeless tobacco: Never Used  . Alcohol Use: No     Comment: last drink 1 month aso    Review of Systems  Constitutional: Negative for fever, chills,  diaphoresis and fatigue.  Respiratory: Positive for shortness of breath. Negative for cough, chest tightness and wheezing.   Cardiovascular: Positive for chest pain (severe, right sided, constant, sharp) and palpitations. Negative for leg swelling.  Gastrointestinal: Negative for nausea, vomiting and abdominal pain.      Allergies  Bee venom  Home Medications   Prior to Admission medications   Medication Sig Start Date End Date Taking? Authorizing Provider  docusate sodium (COLACE) 100 MG capsule Take 1 capsule (100 mg total) by mouth 2 (two) times daily as needed for mild constipation. Hold if you have diarrhea. 10/01/13  Yes Tora Kindred York, PA-C  folic acid (FOLVITE) 1 MG tablet Take 1 tablet (1 mg total) by mouth daily. 09/09/13  Yes Clydia Llano, MD  furosemide (LASIX) 40 MG tablet Take 2 tablets (80 mg total) by mouth 2 (two) times daily. 10/01/13  Yes Marianne L York, PA-C  lactulose (CHRONULAC) 10 GM/15ML solution Take 45 mLs (30 g total) by mouth daily. 10/01/13  Yes Marianne L York, PA-C  Multiple Vitamin (MULTIVITAMIN WITH MINERALS) TABS tablet Take 1 tablet by mouth daily. 09/09/13  Yes Clydia Llano, MD  oxyCODONE (ROXICODONE) 5 MG immediate release tablet Take 1 tablet (5 mg total) by mouth every 4 (four) hours as needed for severe pain. 09/23/13  Yes Stephani Police,  PA-C  tamsulosin (FLOMAX) 0.4 MG CAPS capsule Take 1 capsule (0.4 mg total) by mouth daily. 09/23/13  Yes Tora Kindred York, PA-C  thiamine 100 MG tablet Take 1 tablet (100 mg total) by mouth daily. 09/09/13  Yes Mutaz Elmahi, MD   BP 120/78  Pulse 90  Temp(Src) 97.9 F (36.6 C) (Oral)  Resp 20  Ht 5\' 11"  (1.803 m)  Wt 176 lb 5.9 oz (80 kg)  BMI 24.61 kg/m2  SpO2 93% Physical Exam  Nursing note and vitals reviewed. Constitutional: He appears well-developed and well-nourished.  Pt lying in exam bed, appears uncomfortable.  HENT:  Head: Normocephalic and atraumatic.  Eyes: Conjunctivae are normal. No scleral  icterus.  Neck: Normal range of motion.  Cardiovascular: Normal rate, regular rhythm and normal heart sounds.   Pulmonary/Chest: Effort normal. No respiratory distress. He has no wheezes. He has no rales. He exhibits no tenderness.  No respiratory distress, able to speak in full sentences, however, decreased breath sounds in RUL field. Breath sounds absent in right lower lung fields. No crackles or wheeze.  Abdominal: Soft. Bowel sounds are normal. He exhibits no distension and no mass. There is no tenderness. There is no rebound and no guarding.  Sof, non-tender, no rebounding, masses or guarding.  Musculoskeletal: Normal range of motion.  Neurological: He is alert.  Skin: Skin is warm and dry.    ED Course  Procedures (including critical care time) Labs Review Labs Reviewed  CBC - Abnormal; Notable for the following:    WBC 21.1 (*)    RBC 4.12 (*)    HCT 37.0 (*)    Platelets 84 (*)    All other components within normal limits  BASIC METABOLIC PANEL - Abnormal; Notable for the following:    Sodium 125 (*)    Chloride 91 (*)    Glucose, Bld 104 (*)    All other components within normal limits  PRO B NATRIURETIC PEPTIDE - Abnormal; Notable for the following:    Pro B Natriuretic peptide (BNP) 241.7 (*)    All other components within normal limits  URINALYSIS, ROUTINE W REFLEX MICROSCOPIC - Abnormal; Notable for the following:    Color, Urine AMBER (*)    All other components within normal limits  URINE CULTURE  CULTURE, BLOOD (ROUTINE X 2)  CULTURE, BLOOD (ROUTINE X 2)  PROTIME-INR  MAGNESIUM  I-STAT TROPOININ, ED    Imaging Review Dg Chest 2 View  10/06/2013   CLINICAL DATA:  8 lb of weight gain in the past 2 days. Known large right-sided pleural effusion. Abdominal bloating and constipation, of acute onset.  EXAM: CHEST  2 VIEW  COMPARISON:  Chest radiograph performed 09/29/2013  FINDINGS: A large right-sided pleural effusion is again noted, mildly increased from the  prior study, with associated atelectasis. The left lung appears clear. No pneumothorax is seen.  The heart is not well characterized due to the large right-sided pleural effusion. No acute osseous abnormalities are seen.  IMPRESSION: Large right-sided pleural effusion noted, mildly increased from the prior study, with associated atelectasis.   Electronically Signed   By: Roanna Raider M.D.   On: 10/06/2013 03:22    DG Chest 2 View (Final result)  Result time: 10/07/13 13:23:45    Final result by Rad Results In Interface (10/07/13 13:23:45)    Narrative:   CLINICAL DATA: Shortness of breath. Chest pain. Followup right pleural effusion. Heart palpitations and chest tightness approximately 30 min after taking a new unspecified medication.  Current history hepatitis-C.  EXAM: CHEST 2 VIEW  COMPARISON: Two-view chest x-rays yesterday dating back to 08/30/2013.  FINDINGS: Very large right pleural effusion with associated passive atelectasis involving much of the right lung, with aeration of the apex; the effusion has increased in size since yesterday. Left lung remains clear. Cardiac silhouette normal in size, unchanged. Thoracic aorta mildly atherosclerotic, unchanged. No visible left pleural effusion.  IMPRESSION: Interval increase in size of the very large right pleural effusion. Associated dense passive atelectasis involving much of the right lung with only residual aeration of the apex. Left lung remains clear.   Electronically Signed By: Hulan Saashomas Lawrence M.D. On: 10/07/2013 13:23         EKG Interpretation None      MDM   Final diagnoses:  Recurrent right pleural effusion  Ascites due to alcoholic cirrhosis  Hyponatremia    Pt is a 53yo male c/o right sided chest pain and SOB.  Pt states he was admitted 2 weeks ago and had fluid drained from his chest at that time. Pt does have decreased to abscess breath sounds in right lung fields, otherwise appears well,  non-toxic. Vitals: WNL.  Abdominal exam: benign  Labs: concerning for leukocytosis with an increased from 8.4 to 21.1 within last 24 hours.   CXR: consistent with previous.  Discussed pt with Dr. Clarice PolePfeifer who also examined pt who agrees pt does appear well but no apparent source of leukocytosis.   3:05 PM Consulted with Dr. Sunnie Nielsenegalado who agreed to admit pt to med-surg bed. Requested that pulmonology be consulted for thoracentesis.    4:09 PM Never received call back from pulmonology while pt was in ED.  Will re-page pulmonology.   4:16 PM Consulted with Dr. Craige CottaSood pulmonology who will f/u on pt.     Junius FinnerErin O'Malley, PA-C 10/07/13 1624

## 2013-10-07 NOTE — ED Notes (Addendum)
Rt. Sided cp. Hx. Of draining fluid from chest x 2 weeks ago. Feels trapped in lungs. Hx. Of cirrhosis. Heart palpitations all night.

## 2013-10-07 NOTE — ED Notes (Signed)
Pt eating candy brought in by his mother.

## 2013-10-07 NOTE — ED Notes (Signed)
To x-ray

## 2013-10-07 NOTE — H&P (Signed)
[facility] History and Physical  [name] HYQ:[id number] DOB: 29-Nov-1960 DOA: 10/07/2013  Referring physician: Phifer.  PCP: Doris CheadleADVANI, DEEPAK, MD   Chief ComplaiWyvonnia Duskynt: chest pain, abdominal, SOB.   HPI: Xavier DuskyRichard L Valentine is a 53 y.o. male with PMH significant for cirrhosis of the liver, history of alcohol use, hepatitis C, moved from [place] 3 month ago, he is living with his sister who presents complaining of right side chest pain, abdominal pain, SOB that started the night prior to admission. He describe pain as cramping. He relates he use inhale without relived of SOB. He think he had an allergic reaction to docusate. He was seen in the ED on the fifth with abdominal distension that improved after BM.  He ran out of his oxycodone. He has been taking his lasix. He has been drinking fluids.  He relates BM from lactulose. Denies cough, dysuria.    Review of Systems:  Negative, except as per HPI.   Past Medical History  Diagnosis Date  . Alcoholism     chronic.   . Inguinal hernia     repaired in [place] 08/2013  . Hepatitis C 2000  . Perirectal abscess   . Cirrhosis 08/2013    ETOH and Hep C  . Ascites 09/2013  . Thrombocytopenia 09/2013  . Coagulopathy 09/2013    due to hepatic dysfunction.    Past Surgical History  Procedure Laterality Date  . Hernia repair  08/2013    at [facility] in [place].    Social History:  reports that he quit smoking about 5 years ago. His smoking use included Cigarettes. He smoked 0.00 packs per day. He has never used smokeless tobacco. He reports that he uses illicit drugs (Marijuana). He reports that he does not drink alcohol.  Allergies  Allergen Reactions  . Bee Venom Swelling    Family History  Problem Relation Age of Onset  . CAD    . Hypertension    . Diabetes Mother   . Hypertension Mother   . Cancer Father   . Cancer Maternal Grandfather      Prior to Admission medications   Medication Sig Start Date End Date Taking?  Authorizing Provider  docusate sodium (COLACE) 100 MG capsule Take 1 capsule (100 mg total) by mouth 2 (two) times daily as needed for mild constipation. Hold if you have diarrhea. 10/01/13  Yes Tora KindredMarianne L York, PA-C  folic acid (FOLVITE) 1 MG tablet Take 1 tablet (1 mg total) by mouth daily. 09/09/13  Yes Clydia LlanoMutaz Elmahi, MD  furosemide (LASIX) 40 MG tablet Take 2 tablets (80 mg total) by mouth 2 (two) times daily. 10/01/13  Yes Marianne L York, PA-C  lactulose (CHRONULAC) 10 GM/15ML solution Take 45 mLs (30 g total) by mouth daily. 10/01/13  Yes Marianne L York, PA-C  Multiple Vitamin (MULTIVITAMIN WITH MINERALS) TABS tablet Take 1 tablet by mouth daily. 09/09/13  Yes Clydia LlanoMutaz Elmahi, MD  oxyCODONE (ROXICODONE) 5 MG immediate release tablet Take 1 tablet (5 mg total) by mouth every 4 (four) hours as needed for severe pain. 09/23/13  Yes Tora KindredMarianne L York, PA-C  tamsulosin (FLOMAX) 0.4 MG CAPS capsule Take 1 capsule (0.4 mg total) by mouth daily. 09/23/13  Yes Tora KindredMarianne L York, PA-C  thiamine 100 MG tablet Take 1 tablet (100 mg total) by mouth daily. 09/09/13  Yes Clydia LlanoMutaz Elmahi, MD   Physical Exam: Filed Vitals:   10/07/13 1246 10/07/13 1321 10/07/13 1330 10/07/13 1400  BP: 106/57 105/82 128/68 112/64  Pulse:  82 88 88 90  Temp:      TempSrc:      Resp: 21 20    Height:      Weight:      SpO2: 93% 94% 92% 92%    Wt Readings from Last 3 Encounters:  10/07/13 76.204 kg (168 lb)  10/01/13 76.2 kg (167 lb 15.9 oz)  09/26/13 80.377 kg (177 lb 3.2 oz)    General:  Appears calm and comfortable Eyes: PERRL, normal lids, irises. Icteric.  ENT: grossly normal hearing, lips & tongue Neck: no LAD, masses or thyromegaly Cardiovascular: RRR, no m/r/g. Trace  LE edema. Telemetry: SR, no arrhythmias  Respiratory: decrease breath sound right, bilateral crackles,  Normal respiratory effort. Abdomen: soft, nt, distended, no rigidity.  Skin: no rash or induration seen on limited exam Musculoskeletal: grossly normal  tone BUE/BLE Neurologic: grossly non-focal. Alert and oriented.           Labs on Admission:  Basic Metabolic Panel:  Recent Labs Lab 10/01/13 0701 10/06/13 0220 10/07/13 1029  NA 126* 129* 125*  K 4.5 4.3 4.1  CL 89* 94* 91*  CO2 25 26 22   GLUCOSE 75 88 104*  BUN 19 21 21   CREATININE 0.81 0.84 0.90  CALCIUM 8.3* 8.2* 8.4   Liver Function Tests:  Recent Labs Lab 10/06/13 0220  AST 63*  ALT 44  ALKPHOS 99  BILITOT 3.8*  PROT 7.7  ALBUMIN 2.2*   No results found for this basename: LIPASE, AMYLASE,  in the last 168 hours No results found for this basename: AMMONIA,  in the last 168 hours CBC:  Recent Labs Lab 10/06/13 0220 10/07/13 1029  WBC 8.4 21.1*  NEUTROABS 5.2  --   HGB 12.7* 13.1  HCT 36.6* 37.0*  MCV 91.5 89.8  PLT 91* 84*   Cardiac Enzymes: No results found for this basename: CKTOTAL, CKMB, CKMBINDEX, TROPONINI,  in the last 168 hours  BNP (last 3 results)  Recent Labs  09/02/13 0520 09/09/13 0430 10/07/13 1029  PROBNP 348.5* 383.9* 241.7*   CBG: No results found for this basename: GLUCAP,  in the last 168 hours  Radiological Exams on Admission: Dg Chest 2 View  10/07/2013   CLINICAL DATA:  Shortness of breath. Chest pain. Followup right pleural effusion. Heart palpitations and chest tightness approximately 30 min after taking a new unspecified medication. Current history hepatitis-C.  EXAM: CHEST  2 VIEW  COMPARISON:  Two-view chest x-rays yesterday dating back to 08/30/2013.  FINDINGS: Very large right pleural effusion with associated passive atelectasis involving much of the right lung, with aeration of the apex; the effusion has increased in size since yesterday. Left lung remains clear. Cardiac silhouette normal in size, unchanged. Thoracic aorta mildly atherosclerotic, unchanged. No visible left pleural effusion.  IMPRESSION: Interval increase in size of the very large right pleural effusion. Associated dense passive atelectasis involving  much of the right lung with only residual aeration of the apex. Left lung remains clear.   Electronically Signed   By: Hulan Saas M.D.   On: 10/07/2013 13:23   Dg Chest 2 View  10/06/2013   CLINICAL DATA:  8 lb of weight gain in the past 2 days. Known large right-sided pleural effusion. Abdominal bloating and constipation, of acute onset.  EXAM: CHEST  2 VIEW  COMPARISON:  Chest radiograph performed 09/29/2013  FINDINGS: A large right-sided pleural effusion is again noted, mildly increased from the prior study, with associated atelectasis. The left lung appears clear.  No pneumothorax is seen.  The heart is not well characterized due to the large right-sided pleural effusion. No acute osseous abnormalities are seen.  IMPRESSION: Large right-sided pleural effusion noted, mildly increased from the prior study, with associated atelectasis.   Electronically Signed   By: Roanna Raider M.D.   On: 10/06/2013 03:22    EKG: Independently reviewed. Normal sinus rhythm, prolong QT.   Assessment/Plan Principal Problem:   Recurrent right pleural effusion Active Problems:   Coagulopathy   Thrombocytopenia   Hyponatremia   Ascites due to alcoholic cirrhosis   Recurrent pleural effusion on right   1-Chest pain, Abdominal pain, Dyspnea; In setting of recurrent right side pleural effusion, ascites.  He presents now with leukocytosis. Ed physician will ask pulmonary for thoracentesis, I agree. Will need to send fluid for culture, cell count. Will continue with lasix.   2-Recurrent right side pleural effusion: in setting liver failure, hepatic hydrothorax ?. Pulmonary consulted. Need thoracentesis and further recommendations. Might need to add spironolactone. Per prior discharge summary hyponatremia improved after discontinuation of spironolactone. Continue with lasix.   3-Cirrhosis; in setting of hepatitis C and prior history of alcohol use. He stop drinking 2 months ago. Continue with lactulose, lasix.  Needs to establish care with Hepatogist. Check INR.   4-Hyponatremia; in setting of liver failure. Continue with lasix. Baseline around 126---129. Follow trend.   5-Leukocytosis: unclear etiology. Need thoracentesis. Blood culture. Start ceftriaxone to cover for infection, SBP. Follow pleural fluids result. UA ordered.   6-Prolong Qt; check mg level. Repeat EKG in am.   Code Status: He wishes to be DNR.  DVT Prophylaxis:SCD, no anticoagulation platelet less than 100 k.  Family Communication: care discussed with patient and mother who was at bedside.  Disposition Plan: expect 2 to 3 days inpatient.   Time spent: 75 minutes.   Hartley Barefoot A [facility] Pager [phone]

## 2013-10-07 NOTE — ED Notes (Signed)
Pt reports right sided muscle spasms with trembling and shaking front and back of chest last night.  No tremors noted at this time

## 2013-10-08 LAB — COMPREHENSIVE METABOLIC PANEL
ALK PHOS: 88 U/L (ref 39–117)
ALT: 40 U/L (ref 0–53)
AST: 50 U/L — AB (ref 0–37)
Albumin: 2.2 g/dL — ABNORMAL LOW (ref 3.5–5.2)
Anion gap: 9 (ref 5–15)
BILIRUBIN TOTAL: 6 mg/dL — AB (ref 0.3–1.2)
BUN: 25 mg/dL — ABNORMAL HIGH (ref 6–23)
CHLORIDE: 93 meq/L — AB (ref 96–112)
CO2: 25 mEq/L (ref 19–32)
Calcium: 8.6 mg/dL (ref 8.4–10.5)
Creatinine, Ser: 0.77 mg/dL (ref 0.50–1.35)
GFR calc non Af Amer: >90 mL/min (ref >90)
Glucose, Bld: 111 mg/dL — ABNORMAL HIGH (ref 70–99)
POTASSIUM: 3.9 meq/L (ref 3.7–5.3)
SODIUM: 127 meq/L — AB (ref 137–147)
Total Protein: 7.6 g/dL (ref 6.0–8.3)

## 2013-10-08 LAB — CBC
HCT: 36.4 % — ABNORMAL LOW (ref 39.0–52.0)
Hemoglobin: 12.6 g/dL — ABNORMAL LOW (ref 13.0–17.0)
MCH: 31.7 pg (ref 26.0–34.0)
MCHC: 34.6 g/dL (ref 30.0–36.0)
MCV: 91.7 fL (ref 78.0–100.0)
PLATELETS: 77 10*3/uL — AB (ref 150–400)
RBC: 3.97 MIL/uL — AB (ref 4.22–5.81)
RDW: 14.9 % (ref 11.5–15.5)
WBC: 15.8 10*3/uL — AB (ref 4.0–10.5)

## 2013-10-08 LAB — URINE CULTURE
Colony Count: NO GROWTH
Culture: NO GROWTH

## 2013-10-08 MED ORDER — MUPIROCIN 2 % EX OINT
1.0000 "application " | TOPICAL_OINTMENT | Freq: Two times a day (BID) | CUTANEOUS | Status: DC
  Administered 2013-10-08 – 2013-10-09 (×2): 1 via NASAL
  Filled 2013-10-08 (×2): qty 22

## 2013-10-08 MED ORDER — CHLORHEXIDINE GLUCONATE CLOTH 2 % EX PADS
6.0000 | MEDICATED_PAD | Freq: Every day | CUTANEOUS | Status: DC
  Administered 2013-10-09 – 2013-10-10 (×2): 6 via TOPICAL

## 2013-10-08 NOTE — Progress Notes (Signed)
Patient Demographics  Xavier Valentine, is a 53 y.o. male, DOB - October 14, 1960, ZOX:[id number]  Admit date - 10/07/2013   Admitting Physician Alba Cory, MD  Outpatient Primary MD for the patient is [name], MD  LOS - 1   Chief Complaint  Patient presents with  . Shortness of Breath  . Chest Pain        Subjective:   Xavier Valentine today has, No headache, No chest pain, No abdominal pain - No Nausea, No new weakness tingling or numbness, No Cough - SOB.   Assessment & Plan    1. 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 Discussed with pulmonologist Dr. Marchelle Gearing, no need for repeat thoracentesis, no chest tube, maximize diuretics and an outpatient GI followup for TIPS procedure.   2. Cirrhosis due to hep C and Alcohol abuse. Patient scheduled to follow with [facility] this week, may be a candidate for liver transplant, will defer to GI. Claims that he has quit alcohol 2-3 months ago.    3. Hyponatremia. Due to third spacing of fluid from the site, continue diuretics and monitor.    4. History of alcohol abuse but quit 2-3 months ago. Counseled to continue abstaining. No signs of DTs, on thiamine and folic acid.    5. BPH. On Flomax.    Code Status: Full  Family Communication: None present  Disposition Plan:  Home   Procedures     Consults  PCCM   Medications  Scheduled Meds: . cefTRIAXone (ROCEPHIN)  IV  2 g Intravenous Q24H  . folic acid  1 mg Oral Daily  . furosemide  80 mg Oral BID  . lactulose  30 g Oral Daily  . multivitamin with minerals  1 tablet Oral Daily  . sodium chloride  3 mL Intravenous Q12H  . spironolactone  100 mg Oral  BID  . tamsulosin  0.4 mg Oral Daily  . thiamine  100 mg Oral Daily   Continuous Infusions:  PRN Meds:.sodium chloride, HYDROmorphone (DILAUDID) injection, ondansetron (ZOFRAN) IV, oxyCODONE, sodium chloride  DVT Prophylaxis    SCDs    Lab Results  Component Value Date   PLT 77* 10/08/2013    Antibiotics    Anti-infectives   Start     Dose/Rate Route Frequency Ordered Stop   10/07/13 1600  cefTRIAXone (ROCEPHIN) 2 g in dextrose 5 % 50 mL IVPB     2 g 100 mL/hr over 30 Minutes Intravenous Every 24 hours 10/07/13 1548            Objective:   Filed Vitals:   10/07/13 1530 10/07/13 1612 10/07/13 2246 10/08/13 0431  BP: 123/73 120/78 131/69 123/75  Pulse: 99 90 84 101  Temp:  97.9 F (36.6 C) 98.1 F (36.7 C) 98.1 F (36.7 C)  TempSrc:  Oral Oral Oral  Resp:    18  Height:  5\' 11"  (1.803 m)    Weight:  80 kg (176 lb 5.9 oz)  79.7 kg (175 lb 11.3 oz)  SpO2: 91% 93% 94% 94%    Wt Readings from Last 3 Encounters:  10/08/13 79.7 kg (175 lb  11.3 oz)  10/01/13 76.2 kg (167 lb 15.9 oz)  09/26/13 80.377 kg (177 lb 3.2 oz)     Intake/Output Summary (Last 24 hours) at 10/08/13 1232 Last data filed at 10/08/13 1000  Gross per 24 hour  Intake    240 ml  Output      0 ml  Net    240 ml     Physical Exam  Awake Alert, Oriented X 3, No new F.N deficits, Normal affect [place].AT,PERRAL Supple Neck,No JVD, No cervical lymphadenopathy appriciated.  Symmetrical Chest wall movement, Good air movement bilaterally, reduced breath sounds right lower lobe RRR,No Gallops,Rubs or new Murmurs, No Parasternal Heave +ve B.Sounds, Abd Soft but distended, No tenderness, No organomegaly appriciated, No rebound - guarding or rigidity. No Cyanosis, Clubbing or edema, No new Rash or bruise     Data Review   Micro Results Recent Results (from the past 240 hour(s))  CULTURE, BLOOD (ROUTINE X 2)     Status: None   Collection Time    10/07/13  5:08 PM      Result Value Ref Range Status    Specimen Description BLOOD RIGHT ARM   Final   Special Requests BOTTLES DRAWN AEROBIC AND ANAEROBIC 10CC   Final   Culture  Setup Time     Final   Value: 10/07/2013 23:30     Performed at [facility]   Culture     Final   Value:        BLOOD CULTURE RECEIVED NO GROWTH TO DATE CULTURE WILL BE HELD FOR 5 DAYS BEFORE ISSUING A FINAL NEGATIVE REPORT     Performed at [facility]   Report Status PENDING   Incomplete  MRSA PCR SCREENING     Status: Abnormal   Collection Time    10/07/13  5:09 PM      Result Value Ref Range Status   MRSA by PCR POSITIVE (*) NEGATIVE Final   Comment:            The GeneXpert MRSA Assay (FDA     approved for NASAL specimens     only), is one component of a     comprehensive MRSA colonization     surveillance program. It is not     intended to diagnose MRSA     infection nor to guide or     monitor treatment for     MRSA infections.     RESULT CALLED TO, READ BACK BY AND VERIFIED WITH:     A.BRICKHOUSE,RN [id number] 10/07/13 M.CAMPBELL  CULTURE, BLOOD (ROUTINE X 2)     Status: None   Collection Time    10/07/13  5:14 PM      Result Value Ref Range Status   Specimen Description BLOOD LEFT ARM   Final   Special Requests     Final   Value: BOTTLES DRAWN AEROBIC AND ANAEROBIC AER 6CC,4CC ANA   Culture  Setup Time     Final   Value: 10/07/2013 23:27     Performed at [facility]   Culture     Final   Value:        BLOOD CULTURE RECEIVED NO GROWTH TO DATE CULTURE WILL BE HELD FOR 5 DAYS BEFORE ISSUING A FINAL NEGATIVE REPORT     Performed at [facility]   Report Status PENDING   Incomplete    Radiology Reports Dg Chest 2 View  10/07/2013   CLINICAL DATA:  Shortness of breath.  Chest pain. Followup right pleural effusion. Heart palpitations and chest tightness approximately 30 min after taking a new unspecified medication. Current history hepatitis-C.  EXAM: CHEST  2 VIEW  COMPARISON:  Two-view chest x-rays yesterday dating back  to 08/30/2013.  FINDINGS: Very large right pleural effusion with associated passive atelectasis involving much of the right lung, with aeration of the apex; the effusion has increased in size since yesterday. Left lung remains clear. Cardiac silhouette normal in size, unchanged. Thoracic aorta mildly atherosclerotic, unchanged. No visible left pleural effusion.  IMPRESSION: Interval increase in size of the very large right pleural effusion. Associated dense passive atelectasis involving much of the right lung with only residual aeration of the apex. Left lung remains clear.   Electronically Signed   By: Hulan Saashomas  Lawrence M.D.   On: 10/07/2013 13:23   Dg Chest 2 View  10/06/2013   CLINICAL DATA:  8 lb of weight gain in the past 2 days. Known large right-sided pleural effusion. Abdominal bloating and constipation, of acute onset.  EXAM: CHEST  2 VIEW  COMPARISON:  Chest radiograph performed 09/29/2013  FINDINGS: A large right-sided pleural effusion is again noted, mildly increased from the prior study, with associated atelectasis. The left lung appears clear. No pneumothorax is seen.  The heart is not well characterized due to the large right-sided pleural effusion. No acute osseous abnormalities are seen.  IMPRESSION: Large right-sided pleural effusion noted, mildly increased from the prior study, with associated atelectasis.   Electronically Signed   By: Roanna RaiderJeffery  Chang M.D.   On: 10/06/2013 03:22           CBC  Recent Labs Lab 10/06/13 0220 10/07/13 1029 10/08/13 0704  WBC 8.4 21.1* 15.8*  HGB 12.7* 13.1 12.6*  HCT 36.6* 37.0* 36.4*  PLT 91* 84* 77*  MCV 91.5 89.8 91.7  MCH 31.8 31.8 31.7  MCHC 34.7 35.4 34.6  RDW 14.8 14.9 14.9  LYMPHSABS 2.1  --   --   MONOABS 0.8  --   --   EOSABS 0.3  --   --   BASOSABS 0.0  --   --     Chemistries   Recent Labs Lab 10/06/13 0220 10/07/13 1029 10/07/13 1714 10/08/13 0704  NA 129* 125*  --  127*  K 4.3 4.1  --  3.9  CL 94* 91*  --  93*    CO2 26 22  --  25  GLUCOSE 88 104*  --  111*  BUN 21 21  --  25*  CREATININE 0.84 0.90  --  0.77  CALCIUM 8.2* 8.4  --  8.6  MG  --   --  1.8  --   AST 63*  --   --  50*  ALT 44  --   --  40  ALKPHOS 99  --   --  88  BILITOT 3.8*  --   --  6.0*   ------------------------------------------------------------------------------------------------------------------ estimated creatinine clearance is 111.1 ml/min (by C-G formula based on Cr of 0.77). ------------------------------------------------------------------------------------------------------------------ No results found for this basename: HGBA1C,  in the last 72 hours ------------------------------------------------------------------------------------------------------------------ No results found for this basename: CHOL, HDL, LDLCALC, TRIG, CHOLHDL, LDLDIRECT,  in the last 72 hours ------------------------------------------------------------------------------------------------------------------ No results found for this basename: TSH, T4TOTAL, FREET3, T3FREE, THYROIDAB,  in the last 72 hours ------------------------------------------------------------------------------------------------------------------ No results found for this basename: VITAMINB12, FOLATE, FERRITIN, TIBC, IRON, RETICCTPCT,  in the last 72 hours  Coagulation profile  Recent Labs Lab 10/06/13 0220 10/07/13 1714  INR 1.95* 1.95*    No results found for this basename: DDIMER,  in the last 72 hours  Cardiac Enzymes No results found for this basename: CK, CKMB, TROPONINI, MYOGLOBIN,  in the last 168 hours ------------------------------------------------------------------------------------------------------------------ No components found with this basename: POCBNP,      Time Spent in minutes   35   Keyonta Madrid K M.D on 10/08/2013 at 12:32 PM  Between 7am to 7pm - Pager - [phone]  After 7pm go to [email or web address] - password [email or web address]  And look for  the night coverage person covering for me after hours  [facility] Office  [phone]

## 2013-10-08 NOTE — Care Management Note (Signed)
  Page 1 of 1   10/09/2013     11:51:46 AM CARE MANAGEMENT NOTE 10/09/2013  Patient:  Xavier Valentine,Xavier Valentine   Account Number:  [id number]  Date Initiated:  10/08/2013  Documentation initiated by:  Xavier Valentine,Xavier Valentine  Subjective/Objective Assessment:     Action/Plan:   Anticipated DC Date:  10/10/2013   Anticipated DC Plan:  HOME/SELF CARE         Choice offered to / List presented to:             Status of service:   Medicare Important Message given?   (If response is "NO", the following Medicare IM given date fields will be blank) Date Medicare IM given:   Medicare IM given by:   Date Additional Medicare IM given:   Additional Medicare IM given by:    Discharge Disposition:    Per UR Regulation:    If discussed at Long Length of Stay Meetings, dates discussed:    Comments:  10-09-13  [facility] referral form and required information faxed .  [facility] will call patient directly , confirmed patient's contact information with patient. Xavier Valentine   10-08-13 There is a [facility] referral application in patient's shadow chart . Once MD completes , NCM will fax to [facility] . [facility] will call patient directly with appointment . Confirmed patient's phone numner with him and explained.  Xavier Valentine [phone]

## 2013-10-09 ENCOUNTER — Inpatient Hospital Stay (HOSPITAL_COMMUNITY): Payer: Medicaid - Out of State

## 2013-10-09 ENCOUNTER — Ambulatory Visit: Payer: Medicaid - Out of State | Admitting: Physician Assistant

## 2013-10-09 ENCOUNTER — Encounter: Payer: Self-pay | Admitting: Internal Medicine

## 2013-10-09 LAB — COMPREHENSIVE METABOLIC PANEL
ALBUMIN: 2 g/dL — AB (ref 3.5–5.2)
ALT: 33 U/L (ref 0–53)
AST: 54 U/L — ABNORMAL HIGH (ref 0–37)
Alkaline Phosphatase: 87 U/L (ref 39–117)
Anion gap: 9 (ref 5–15)
BUN: 25 mg/dL — AB (ref 6–23)
CALCIUM: 8 mg/dL — AB (ref 8.4–10.5)
CHLORIDE: 91 meq/L — AB (ref 96–112)
CO2: 25 mEq/L (ref 19–32)
CREATININE: 0.81 mg/dL (ref 0.50–1.35)
GFR calc Af Amer: >90 mL/min (ref >90)
GFR calc non Af Amer: >90 mL/min (ref >90)
Glucose, Bld: 140 mg/dL — ABNORMAL HIGH (ref 70–99)
Potassium: 3.9 mEq/L (ref 3.7–5.3)
Sodium: 125 mEq/L — ABNORMAL LOW (ref 137–147)
Total Bilirubin: 2.8 mg/dL — ABNORMAL HIGH (ref 0.3–1.2)
Total Protein: 7.2 g/dL (ref 6.0–8.3)

## 2013-10-09 LAB — CBC
HEMATOCRIT: 33.8 % — AB (ref 39.0–52.0)
Hemoglobin: 12.2 g/dL — ABNORMAL LOW (ref 13.0–17.0)
MCH: 32.2 pg (ref 26.0–34.0)
MCHC: 36.1 g/dL — AB (ref 30.0–36.0)
MCV: 89.2 fL (ref 78.0–100.0)
PLATELETS: 105 10*3/uL — AB (ref 150–400)
RBC: 3.79 MIL/uL — ABNORMAL LOW (ref 4.22–5.81)
RDW: 14.4 % (ref 11.5–15.5)
WBC: 14.4 10*3/uL — AB (ref 4.0–10.5)

## 2013-10-09 LAB — OSMOLALITY, URINE: Osmolality, Ur: 386 mOsm/kg — ABNORMAL LOW (ref 390–1090)

## 2013-10-09 LAB — OSMOLALITY: OSMOLALITY: 272 mosm/kg — AB (ref 275–300)

## 2013-10-09 LAB — CREATININE, URINE, RANDOM: CREATININE, URINE: 66.36 mg/dL

## 2013-10-09 LAB — MAGNESIUM: MAGNESIUM: 1.7 mg/dL (ref 1.5–2.5)

## 2013-10-09 MED ORDER — LIDOCAINE HCL (PF) 1 % IJ SOLN
INTRAMUSCULAR | Status: AC
  Filled 2013-10-09: qty 10

## 2013-10-09 MED ORDER — OXYCODONE HCL 5 MG PO TABS: 5.0000 mg | ORAL_TABLET | ORAL | Status: DC | PRN

## 2013-10-09 MED ORDER — CLONAZEPAM 0.5 MG PO TABS: 0.5000 mg | ORAL_TABLET | Freq: Every day | ORAL | Status: DC | PRN

## 2013-10-09 NOTE — Progress Notes (Signed)
Name: Xavier DuskyRichard L Gambrill MRN: [id number] DOB: [birth year]    ADMISSION DATE:  10/07/2013 CONSULTATION DATE:  10/07/2013  REFERRING MD :  Dr. Sunnie Nielsenegalado  CHIEF COMPLAINT:  R chest pain  BRIEF PATIENT DESCRIPTION: 53yo male with severe alcoholic cirrhosis presented to [facility] 10/6 c/o R sided chest pain, abd pain, and SOB. CXR in ED showed large R pleural effusion. He was admitted to the hospitalist team. PCCM has been consulted to evaluate for possible thoracentesis.   SIGNIFICANT EVENTS  9/20 paracentesis with 3.6 liters yellow tinged fluid removed. 9/26 thoracentesis  - transudate, hepatic hydrothorax 10/5 to ED with abd distention, improved with BM and discharged 10/6 to ED with SOB and chest pain, admitted with effusion.   STUDIES:    SUBJECTIVE: Stating that he feels SOB   VITAL SIGNS: Temp:  [98 F (36.7 C)-98.3 F (36.8 C)] 98.3 F (36.8 C) (10/08 0900) Pulse Rate:  [82-95] 95 (10/08 0900) Resp:  [16-18] 18 (10/08 0900) BP: (117-128)/(67-70) 117/67 mmHg (10/08 0900) SpO2:  [90 %-95 %] 90 % (10/08 0545) Weight:  [81.7 kg (180 lb 1.9 oz)] 81.7 kg (180 lb 1.9 oz) (10/08 0540)  PHYSICAL EXAMINATION: General:  Thin male in NAD Neuro:  Alert, oriented, no focal deficit HEENT:  [place]/AT, PERRL, moderate JVD Cardiovascular:  RRR, no MRG Lungs:  Diminished R, unlabored Abdomen:  Moderate ascites, hyperactive BS, non-tender Musculoskeletal:  No acute deformity or ROM limitation Skin:  Jaundiced, intact.    Recent Labs Lab 10/06/13 0220 10/07/13 1029 10/08/13 0704  NA 129* 125* 127*  K 4.3 4.1 3.9  CL 94* 91* 93*  CO2 26 22 25   BUN 21 21 25*  CREATININE 0.84 0.90 0.77  GLUCOSE 88 104* 111*    Recent Labs Lab 10/07/13 1029 10/08/13 0704 10/09/13 1000  HGB 13.1 12.6* 12.2*  HCT 37.0* 36.4* 33.8*  WBC 21.1* 15.8* 14.4*  PLT 84* 77* 105*   Dg Chest 2 View  10/07/2013   CLINICAL DATA:  Shortness of breath. Chest pain. Followup right pleural effusion. Heart  palpitations and chest tightness approximately 30 min after taking a new unspecified medication. Current history hepatitis-C.  EXAM: CHEST  2 VIEW  COMPARISON:  Two-view chest x-rays yesterday dating back to 08/30/2013.  FINDINGS: Very large right pleural effusion with associated passive atelectasis involving much of the right lung, with aeration of the apex; the effusion has increased in size since yesterday. Left lung remains clear. Cardiac silhouette normal in size, unchanged. Thoracic aorta mildly atherosclerotic, unchanged. No visible left pleural effusion.  IMPRESSION: Interval increase in size of the very large right pleural effusion. Associated dense passive atelectasis involving much of the right lung with only residual aeration of the apex. Left lung remains clear.   Electronically Signed   By: Hulan Saashomas  Lawrence M.D.   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 Prior home regimen includes: lactulose 30g daily, lasix 80mg  BID, thiamine 100mg ; reports compliance  - No acute indication for thoracentesis given lack of suspicion of spontaneous bacterial pleuritis or dyspnea - Continue spironolactone at 200mg  per day; gradually increase to 400mg  per day (Staff MD note) - Continue Salt restricted diet - IF diuresis and low salt restriction do not work, Pt would benefit from TIPS placement, with IR (goal Child Pugh <13 and is safe for TIPS for this - indication)  - Ultimately work towards  liver transplant (TIPS should not be a contraindication for liver transplant) GI consult would help here.  - No chest tube ever into this pleural space - Primary team to D/c today. Agree he can go home with diuresis and should adhere to low sodium diet.  - He will follow up with GI and [facility].  - OPD fu with Dr Marchelle Gearing in 3 weeks for hepatic hydrothorax - if no response set up TIPS (staff md  note)   Joneen Roach, ACNP [facility] Pulmonology/Critical Care Pager [phone] or [phone]   STAFF NOTE  - personally evlauated patient. No dyspnea. See my comments - "Staff md note". Rest per NP. PCCM will sign off. My NP will set up fu with me   Dr. Kalman Shan, M.D., [facility].C.P Pulmonary and Critical Care Medicine Staff Physician [facility] [place] Pulmonary and Critical Care Pager: [phone], If no answer or between  15:00h - 7:00h: call [phone]  10/09/2013 12:30 PM                .

## 2013-10-09 NOTE — Progress Notes (Signed)
Patient Demographics  Xavier Valentine, is a 53 y.o. male, DOB - 1960-03-27, ZOX:[id number]  Admit date - 10/07/2013   Admitting Physician Alba Cory, MD  Outpatient Primary MD for the patient is [name], MD  LOS - 2   Chief Complaint  Patient presents with  . Shortness of Breath  . Chest Pain        Subjective:   Xavier Valentine today has, No headache, No chest pain, No abdominal pain - No Nausea, No new weakness tingling or numbness, No Cough - SOB.   Assessment & Plan    1. 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 Discussed with pulmonologist Dr. Marchelle Gearing, no need for repeat thoracentesis, no chest tube, maximize diuretics and an outpatient GI followup for TIPS procedure.   2. 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 Patient scheduled to follow with [facility] this week, may be a candidate for liver transplant, will defer to GI. Claims that he has quit alcohol 2-3 months ago. Will request interventional radiology to do other therapeutic paracentesis under ultrasound guidance on 10/09/2013 as belly seems to be more distended.    3. Hyponatremia. Due to third spacing of fluid from the site, continue diuretics and monitor. IR guided paracentesis as above, the BMP in the morning.    4. History of alcohol abuse but quit 2-3 months ago. Counseled to continue abstaining. No signs of DTs, on thiamine and folic acid.    5. BPH. On Flomax.    6. Leukocytosis. Nonspecific. He is currently on cephalosporin, do not think he is SBP, have ordered IR to do ultrasound-guided paracentesis, will order a cell count differential, Gram stain culture and a  protein count in ascites fluid. Continue cephalosporin for now. UA unremarkable. He is afebrile.    Code Status: Full  Family Communication: None present  Disposition Plan:  Home 10/10/2013 differential BMP stable   Procedures  IR guided paracentesis requested on 10/09/2013   Consults  PCCM   Medications  Scheduled Meds: . cefTRIAXone (ROCEPHIN)  IV  2 g Intravenous Q24H  . Chlorhexidine Gluconate Cloth  6 each Topical Q0600  . folic acid  1 mg Oral Daily  . furosemide  80 mg Oral BID  . lactulose  30 g Oral Daily  . multivitamin with minerals  1 tablet Oral Daily  . mupirocin ointment  1 application Nasal BID  . sodium chloride  3 mL Intravenous Q12H  . spironolactone  100 mg Oral BID  . tamsulosin  0.4 mg Oral Daily  . thiamine  100 mg Oral Daily   Continuous Infusions:  PRN Meds:.sodium chloride, HYDROmorphone (DILAUDID) injection, ondansetron (ZOFRAN) IV, oxyCODONE, sodium chloride  DVT Prophylaxis    SCDs    Lab Results  Component Value Date   PLT 105* 10/09/2013    Antibiotics    Anti-infectives   Start     Dose/Rate Route Frequency Ordered Stop   10/07/13 1600  cefTRIAXone (ROCEPHIN) 2 g in dextrose 5 % 50 mL IVPB     2 g 100 mL/hr over 30 Minutes Intravenous Every 24 hours 10/07/13 1548  Objective:   Filed Vitals:   10/08/13 2332 10/09/13 0540 10/09/13 0545 10/09/13 0900  BP: 128/70  120/70 117/67  Pulse: 82  85 95  Temp: 98 F (36.7 C)  98.3 F (36.8 C) 98.3 F (36.8 C)  TempSrc: Oral  Oral Oral  Resp: 17  16 18   Height:      Weight:  81.7 kg (180 lb 1.9 oz)    SpO2: 95%  90%     Wt Readings from Last 3 Encounters:  10/09/13 81.7 kg (180 lb 1.9 oz)  10/01/13 76.2 kg (167 lb 15.9 oz)  09/26/13 80.377 kg (177 lb 3.2 oz)     Intake/Output Summary (Last 24 hours) at 10/09/13 1104 Last data filed at 10/08/13 1858  Gross per 24 hour  Intake    730 ml  Output      0 ml  Net    730 ml     Physical Exam  Awake Alert,  Oriented X 3, No new F.N deficits, Normal affect [place].AT,PERRAL Supple Neck,No JVD, No cervical lymphadenopathy appriciated.  Symmetrical Chest wall movement, Good air movement bilaterally, reduced breath sounds right lower lobe RRR,No Gallops,Rubs or new Murmurs, No Parasternal Heave +ve B.Sounds, Abd Soft but distended, No tenderness, No organomegaly appriciated, No rebound - guarding or rigidity. No Cyanosis, Clubbing or edema, No new Rash or bruise     Data Review   Micro Results Recent Results (from the past 240 hour(s))  URINE CULTURE     Status: None   Collection Time    10/07/13  3:02 PM      Result Value Ref Range Status   Specimen Description URINE, RANDOM   Final   Special Requests NONE   Final   Culture  Setup Time     Final   Value: 10/07/2013 20:54     Performed at [facility]   Colony Count     Final   Value: NO GROWTH     [facility]formed at [facility]   Culture     Final   Value: NO GROWTH     Performed at [facility]   Report Status 10/08/2013 FINAL   Final  CULTURE, BLOOD (ROUTINE X 2)     Status: None   Collection Time    10/07/13  5:08 PM      Result Value Ref Range Status   Specimen Description BLOOD RIGHT ARM   Final   Special Requests BOTTLES DRAWN AEROBIC AND ANAEROBIC 10CC   Final   Culture  Setup Time     Final   Value: 10/07/2013 23:30     Performed at [facility]   Culture     Final   Value:        BLOOD CULTURE RECEIVED NO GROWTH TO DATE CULTURE WILL BE HELD FOR 5 DAYS BEFORE ISSUING A FINAL NEGATIVE REPORT     Performed at [facility]   Report Status PENDING   Incomplete  MRSA PCR SCREENING     Status: Abnormal   Collection Time    10/07/13  5:09 PM      Result Value Ref Range Status   MRSA by PCR POSITIVE (*) NEGATIVE Final   Comment:            The GeneXpert MRSA Assay (FDA     approved for NASAL specimens     only), is one component of a     comprehensive MRSA colonization  surveillance  program. It is not     intended to diagnose MRSA     infection nor to guide or     monitor treatment for     MRSA infections.     RESULT CALLED TO, READ BACK BY AND VERIFIED WITH:     A.BRICKHOUSE,RN [id number] 10/07/13 M.CAMPBELL  CULTURE, BLOOD (ROUTINE X 2)     Status: None   Collection Time    10/07/13  5:14 PM      Result Value Ref Range Status   Specimen Description BLOOD LEFT ARM   Final   Special Requests     Final   Value: BOTTLES DRAWN AEROBIC AND ANAEROBIC AER 6CC,4CC ANA   Culture  Setup Time     Final   Value: 10/07/2013 23:27     Performed at [facility]   Culture     Final   Value:        BLOOD CULTURE RECEIVED NO GROWTH TO DATE CULTURE WILL BE HELD FOR 5 DAYS BEFORE ISSUING A FINAL NEGATIVE REPORT     Performed at [facility]   Report Status PENDING   Incomplete    Radiology Reports Dg Chest 2 View  10/07/2013   CLINICAL DATA:  Shortness of breath. Chest pain. Followup right pleural effusion. Heart palpitations and chest tightness approximately 30 min after taking a new unspecified medication. Current history hepatitis-C.  EXAM: CHEST  2 VIEW  COMPARISON:  Two-view chest x-rays yesterday dating back to 08/30/2013.  FINDINGS: Very large right pleural effusion with associated passive atelectasis involving much of the right lung, with aeration of the apex; the effusion has increased in size since yesterday. Left lung remains clear. Cardiac silhouette normal in size, unchanged. Thoracic aorta mildly atherosclerotic, unchanged. No visible left pleural effusion.  IMPRESSION: Interval increase in size of the very large right pleural effusion. Associated dense passive atelectasis involving much of the right lung with only residual aeration of the apex. Left lung remains clear.   Electronically Signed   By: Hulan Saas M.D.   On: 10/07/2013 13:23   Dg Chest 2 View  10/06/2013   CLINICAL DATA:  8 lb of weight gain in the past 2 days. Known large right-sided pleural  effusion. Abdominal bloating and constipation, of acute onset.  EXAM: CHEST  2 VIEW  COMPARISON:  Chest radiograph performed 09/29/2013  FINDINGS: A large right-sided pleural effusion is again noted, mildly increased from the prior study, with associated atelectasis. The left lung appears clear. No pneumothorax is seen.  The heart is not well characterized due to the large right-sided pleural effusion. No acute osseous abnormalities are seen.  IMPRESSION: Large right-sided pleural effusion noted, mildly increased from the prior study, with associated atelectasis.   Electronically Signed   By: Roanna Raider M.D.   On: 10/06/2013 03:22           CBC  Recent Labs Lab 10/06/13 0220 10/07/13 1029 10/08/13 0704 10/09/13 1000  WBC 8.4 21.1* 15.8* 14.4*  HGB 12.7* 13.1 12.6* 12.2*  HCT 36.6* 37.0* 36.4* 33.8*  PLT 91* 84* 77* 105*  MCV 91.5 89.8 91.7 89.2  MCH 31.8 31.8 31.7 32.2  MCHC 34.7 35.4 34.6 36.1*  RDW 14.8 14.9 14.9 14.4  LYMPHSABS 2.1  --   --   --   MONOABS 0.8  --   --   --   EOSABS 0.3  --   --   --   BASOSABS 0.0  --   --   --  Chemistries   Recent Labs Lab 10/06/13 0220 10/07/13 1029 10/07/13 1714 10/08/13 0704 10/09/13 1000  NA 129* 125*  --  127* 125*  K 4.3 4.1  --  3.9 3.9  CL 94* 91*  --  93* 91*  CO2 26 22  --  25 25  GLUCOSE 88 104*  --  111* 140*  BUN 21 21  --  25* 25*  CREATININE 0.84 0.90  --  0.77 0.81  CALCIUM 8.2* 8.4  --  8.6 8.0*  MG  --   --  1.8  --   --   AST 63*  --   --  50* 54*  ALT 44  --   --  40 33  ALKPHOS 99  --   --  88 87  BILITOT 3.8*  --   --  6.0* 2.8*   ------------------------------------------------------------------------------------------------------------------ estimated creatinine clearance is 109.7 ml/min (by C-G formula based on Cr of 0.81). ------------------------------------------------------------------------------------------------------------------ No results found for this basename: HGBA1C,  in the last  72 hours ------------------------------------------------------------------------------------------------------------------ No results found for this basename: CHOL, HDL, LDLCALC, TRIG, CHOLHDL, LDLDIRECT,  in the last 72 hours ------------------------------------------------------------------------------------------------------------------ No results found for this basename: TSH, T4TOTAL, FREET3, T3FREE, THYROIDAB,  in the last 72 hours ------------------------------------------------------------------------------------------------------------------ No results found for this basename: VITAMINB12, FOLATE, FERRITIN, TIBC, IRON, RETICCTPCT,  in the last 72 hours  Coagulation profile  Recent Labs Lab 10/06/13 0220 10/07/13 1714  INR 1.95* 1.95*    No results found for this basename: DDIMER,  in the last 72 hours  Cardiac Enzymes No results found for this basename: CK, CKMB, TROPONINI, MYOGLOBIN,  in the last 168 hours ------------------------------------------------------------------------------------------------------------------ No components found with this basename: POCBNP,      Time Spent in minutes   35   Starlee Corralejo K M.D on 10/09/2013 at 11:04 AM  Between 7am to 7pm - Pager - [phone]  After 7pm go to [email or web address] - password [email or web address]  And look for the night coverage person covering for me after hours  [facility] Office  [phone]

## 2013-10-09 NOTE — Progress Notes (Signed)
Patient sent to US for requested paracentesis, minimal ascites located perihepatic not amendable to safely proceed percutaneously. Pleural fluid seen on US.  Pattricia BossKoreen Annalei Friesz PA-C Interventional Radiology  10/09/13  2:33 PM

## 2013-10-10 LAB — BASIC METABOLIC PANEL
ANION GAP: 13 (ref 5–15)
BUN: 26 mg/dL — ABNORMAL HIGH (ref 6–23)
CHLORIDE: 89 meq/L — AB (ref 96–112)
CO2: 24 mEq/L (ref 19–32)
CREATININE: 0.84 mg/dL (ref 0.50–1.35)
Calcium: 8 mg/dL — ABNORMAL LOW (ref 8.4–10.5)
GFR calc Af Amer: >90 mL/min (ref >90)
GFR calc non Af Amer: >90 mL/min (ref >90)
Glucose, Bld: 111 mg/dL — ABNORMAL HIGH (ref 70–99)
Potassium: 4.2 mEq/L (ref 3.7–5.3)
Sodium: 126 mEq/L — ABNORMAL LOW (ref 137–147)

## 2013-10-10 MED ORDER — SPIRONOLACTONE 100 MG PO TABS: 100.0000 mg | ORAL_TABLET | Freq: Two times a day (BID) | ORAL | Status: DC

## 2013-10-10 MED ORDER — FUROSEMIDE 40 MG PO TABS: 80.0000 mg | ORAL_TABLET | Freq: Two times a day (BID) | ORAL | Status: DC

## 2013-10-10 NOTE — Discharge Instructions (Signed)
Follow with Primary MD Doris CheadleADVANI, DEEPAK, MD in 3 days   Get CBC, CMP, 2 view Chest X ray checked  by Primary MD next visit.    Activity: As tolerated with Full fall precautions use walker/cane & assistance as needed   Disposition Home     Diet: Heart Healthy   Check your Weight same time everyday, if you gain over 2 pounds, or you develop in leg swelling, experience more shortness of breath or chest pain, call your Primary MD immediately. Follow Cardiac Low Salt Diet and 1.2 lit/day fluid restriction.   On your next visit with her primary care physician please Get Medicines reviewed and adjusted.  Please request your Prim.MD to go over all Hospital Tests and Procedure/Radiological results at the follow up, please get all Hospital records sent to your Prim MD by signing hospital release before you go home.   If you experience worsening of your admission symptoms, develop shortness of breath, life threatening emergency, suicidal or homicidal thoughts you must seek medical attention immediately by calling 911 or calling your MD immediately  if symptoms less severe.  You Must read complete instructions/literature along with all the possible adverse reactions/side effects for all the Medicines you take and that have been prescribed to you. Take any new Medicines after you have completely understood and accpet all the possible adverse reactions/side effects.   Do not drive, operating heavy machinery, perform activities at heights, swimming or participation in water activities or provide baby sitting services if your were admitted for syncope or siezures until you have seen by Primary MD or a Neurologist and advised to do so again.  Do not drive when taking Pain medications.    Do not take more than prescribed Pain, Sleep and Anxiety Medications  Special Instructions: If you have smoked or chewed Tobacco  in the last 2 yrs please stop smoking, stop any regular Alcohol  and or any Recreational  drug use.  Wear Seat belts while driving.   Please note  You were cared for by a hospitalist during your hospital stay. If you have any questions about your discharge medications or the care you received while you were in the hospital after you are discharged, you can call the unit and asked to speak with the hospitalist on call if the hospitalist that took care of you is not available. Once you are discharged, your primary care physician will handle any further medical issues. Please note that NO REFILLS for any discharge medications will be authorized once you are discharged, as it is imperative that you return to your primary care physician (or establish a relationship with a primary care physician if you do not have one) for your aftercare needs so that they can reassess your need for medications and monitor your lab values.

## 2013-10-10 NOTE — Discharge Summary (Signed)
Xavier Valentine, is a 53 y.o. male  DOB 1960-04-06  MRN [id number].  Admission date:  10/07/2013  Admitting Physician  Alba Cory, MD  Discharge Date:  10/10/2013   Primary MD  [name], MD  Recommendations for primary care physician for things to follow:   Kindly monitor CBC and BMP closely check in 3 days.  He must follow with pulmonary, [facility] and suggested liver clinic(set up by case manager)   Admission Diagnosis  recurrent right pleural effusion   Discharge Diagnosis  recurrent right pleural effusion    Principal Problem:   Recurrent right pleural effusion Active Problems:   Coagulopathy   Thrombocytopenia   Hyponatremia   Ascites due to alcoholic cirrhosis   Recurrent pleural effusion on right      Past Medical History  Diagnosis Date  . Cirrhosis 08/2013    ETOH and Hep C  . Ascites 09/2013  . Thrombocytopenia 09/2013  . Coagulopathy 09/2013    due to hepatic dysfunction.   . Pneumonia 09/2013  . Chronic alcoholism   . Hepatitis C dx'd ~ 2000  . Anxiety   . Inguinal hernia     repaired in [place] 08/2013    Past Surgical History  Procedure Laterality Date  . Incision and drainage perirectal abscess  1980's  . Inguinal hernia repair Right 08/2013    at [facility] in [place].        History of present illness and  Hospital Course:     Kindly see H&P for history of present illness and admission details, please review complete Labs, Consult reports and Test reports for all details in brief  HPI  from the history and physical done on the day of admission   Xavier Valentine is a 53 y.o. male with PMH significant for cirrhosis of the liver, history of alcohol use, hepatitis C, moved from [place] 3 month ago, he is living with his sister who presents complaining of right side chest pain,  abdominal pain, SOB that started the night prior to admission. He describe pain as cramping. He relates he use inhale without relived of SOB. He think he had an allergic reaction to docusate. He was seen in the ED on the fifth with abdominal distension that improved after BM. He ran out of his oxycodone. He has been taking his lasix. He has been drinking fluids. He relates BM from lactulose. Denies cough, dysuria.     Hospital Course    1. Recurrent right pleural effusion - which is proven to be a transudate per recent thoracentesis, this is sympathetic pleural effusion due to cirrhosis and ascites. Discussed with pulmonologist Dr. Marchelle Gearing, no need for repeat thoracentesis, no chest tube, maximize diuretics and an outpatient GI followup for TIPS procedure. Request PCP to monitor BMP closely as diuretic dose has been increased with Lasix 80 mg twice a day and Aldactone 100 mg twice a day by pulmonary.   2. 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 Patient scheduled to  follow with [facility] next week, may be a candidate for liver transplant, will defer to GI. Claims that he has quit alcohol 2-3 months ago. Case management has also found him an appointment with [facility] on 20th of this month. He is been requested to follow with both [facility] and liver clinic promptly.   3. Hyponatremia. Due to third spacing of fluid from the site, continue diuretics and monitor BMP closely, ultrasound done by IR not enough fluid for paracentesis this admission.   4. History of alcohol abuse but quit 2-3 months ago. Counseled to continue abstaining. No signs of DTs, on thiamine and folic acid.     5. BPH. On Flomax.    6. Leukocytosis. Nonspecific. Remained afebrile trend improving, no clinical signs of SBP or pneumonia. Seen by pulmonary pneumonia parapneumonic effusion ruled out by them as well. Ultrasound done by IR with minimal to no fluid for drainage. He has no  abdominal pain and fever. Follow CBC in the outpatient setting post discharge.     Discharge Condition: stable   Follow UP  Follow-up Information   [facility] On 10[facility]/2013. (9:15am - [facility])    Specialty:  Pulmonary Disease   Contact information:   [address] [place] [address] [phone]       Follow up with Doris CheadleADVANI, DEEPAK, MD. Schedule an appointment as soon as possible for a visit in 3 days.   Specialty:  Internal Medicine   Contact information:   [address] [place] [address] [phone]       Follow up with [facility] liver specialist as instructed by case manager. (And  GI)         Discharge Instructions  and  Discharge Medications      Discharge Instructions   Discharge instructions    Complete by:  As directed   Follow with Primary MD Doris CheadleADVANI, DEEPAK, MD in 3 days   Get CBC, CMP, 2 view Chest X ray checked  by Primary MD next visit.    Activity: As tolerated with Full fall precautions use walker/cane & assistance as needed   Disposition Home     Diet: Heart Healthy   Check your Weight same time everyday, if you gain over 2 pounds, or you develop in leg swelling, experience more shortness of breath or chest pain, call your Primary MD immediately. Follow Cardiac Low Salt Diet and 1.2 lit/day fluid restriction.   On your next visit with her primary care physician please Get Medicines reviewed and adjusted.  Please request your Prim.MD to go over all Hospital Tests and Procedure/Radiological results at the follow up, please get all Hospital records sent to your Prim MD by signing hospital release before you go home.   If you experience worsening of your admission symptoms, develop shortness of breath, life threatening emergency, suicidal or homicidal thoughts you must seek medical attention immediately by calling 911 or calling your MD immediately  if symptoms less severe.  You Must read complete  instructions/literature along with all the possible adverse reactions/side effects for all the Medicines you take and that have been prescribed to you. Take any new Medicines after you have completely understood and accpet all the possible adverse reactions/side effects.   Do not drive, operating heavy machinery, perform activities at heights, swimming or participation in water activities or provide baby sitting services if your were admitted for syncope or siezures until you have seen by Primary MD  or a Neurologist and advised to do so again.  Do not drive when taking Pain medications.    Do not take more than prescribed Pain, Sleep and Anxiety Medications  Special Instructions: If you have smoked or chewed Tobacco  in the last 2 yrs please stop smoking, stop any regular Alcohol  and or any Recreational drug use.  Wear Seat belts while driving.   Please note  You were cared for by a hospitalist during your hospital stay. If you have any questions about your discharge medications or the care you received while you were in the hospital after you are discharged, you can call the unit and asked to speak with the hospitalist on call if the hospitalist that took care of you is not available. Once you are discharged, your primary care physician will handle any further medical issues. Please note that NO REFILLS for any discharge medications will be authorized once you are discharged, as it is imperative that you return to your primary care physician (or establish a relationship with a primary care physician if you do not have one) for your aftercare needs so that they can reassess your need for medications and monitor your lab values.     Increase activity slowly    Complete by:  As directed             Medication List         clonazePAM 0.5 MG tablet  Commonly known as:  KLONOPIN  Take 1 tablet (0.5 mg total) by mouth daily as needed for anxiety.     docusate sodium 100 MG capsule    Commonly known as:  COLACE  Take 1 capsule (100 mg total) by mouth 2 (two) times daily as needed for mild constipation. Hold if you have diarrhea.     folic acid 1 MG tablet  Commonly known as:  FOLVITE  Take 1 tablet (1 mg total) by mouth daily.     furosemide 40 MG tablet  Commonly known as:  LASIX  Take 2 tablets (80 mg total) by mouth 2 (two) times daily.     lactulose 10 GM/15ML solution  Commonly known as:  CHRONULAC  Take 45 mLs (30 g total) by mouth daily.     multivitamin with minerals Tabs tablet  Take 1 tablet by mouth daily.     oxyCODONE 5 MG immediate release tablet  Commonly known as:  ROXICODONE  Take 1 tablet (5 mg total) by mouth every 4 (four) hours as needed for severe pain.     spironolactone 100 MG tablet  Commonly known as:  ALDACTONE  Take 1 tablet (100 mg total) by mouth 2 (two) times daily.     tamsulosin 0.4 MG Caps capsule  Commonly known as:  FLOMAX  Take 1 capsule (0.4 mg total) by mouth daily.     thiamine 100 MG tablet  Take 1 tablet (100 mg total) by mouth daily.          Diet and Activity recommendation: See Discharge Instructions above   Consults obtained - pulmonary, IR   Major procedures and Radiology Reports - PLEASE review detailed and final reports for all details, in brief -       Dg Chest 2 View  10/07/2013   CLINICAL DATA:  Shortness of breath. Chest pain. Followup right pleural effusion. Heart palpitations and chest tightness approximately 30 min after taking a new unspecified medication. Current history hepatitis-C.  EXAM: CHEST  2 VIEW  COMPARISON:  Two-view chest x-rays yesterday dating back to 08/30/2013.  FINDINGS: Very large right pleural effusion with associated passive atelectasis involving much of the right lung, with aeration of the apex; the effusion has increased in size since yesterday. Left lung remains clear. Cardiac silhouette normal in size, unchanged. Thoracic aorta mildly atherosclerotic, unchanged. No  visible left pleural effusion.  IMPRESSION: Interval increase in size of the very large right pleural effusion. Associated dense passive atelectasis involving much of the right lung with only residual aeration of the apex. Left lung remains clear.   Electronically Signed   By: Hulan Saas M.D.   On: 10/07/2013 13:23   Dg Chest 2 View  10/06/2013   CLINICAL DATA:  8 lb of weight gain in the past 2 days. Known large right-sided pleural effusion. Abdominal bloating and constipation, of acute onset.  EXAM: CHEST  2 VIEW  COMPARISON:  Chest radiograph performed 09/29/2013  FINDINGS: A large right-sided pleural effusion is again noted, mildly increased from the prior study, with associated atelectasis. The left lung appears clear. No pneumothorax is seen.  The heart is not well characterized due to the large right-sided pleural effusion. No acute osseous abnormalities are seen.  IMPRESSION: Large right-sided pleural effusion noted, mildly increased from the prior study, with associated atelectasis.   Electronically Signed   By: Roanna Raider M.D.   On: 10/06/2013 03:22      US Abdomen Limited  10/09/2013   CLINICAL DATA:  Ascites, cirrhosis, hepatitis-C, assessment for paracentesis  EXAM: LIMITED ABDOMEN ULTRASOUND FOR ASCITES  TECHNIQUE: Limited ultrasound survey for ascites was performed in all four abdominal quadrants.  COMPARISON:  09/21/2013  FINDINGS: Minimal perihepatic and LEFT lower quadrant ascites identified.  Volume of ascites is insufficient to warrant paracentesis.  IMPRESSION: Insufficient ascites for paracentesis.   Electronically Signed   By: Ulyses Southward M.D.   On: 10/09/2013 14:44       Micro Results      Recent Results (from the past 240 hour(s))  URINE CULTURE     Status: None   Collection Time    10/07/13  3:02 PM      Result Value Ref Range Status   Specimen Description URINE, RANDOM   Final   Special Requests NONE   Final   Culture  Setup Time     Final   Value:  10/07/2013 20:54     Performed at [facility] Count     Final   Value: NO GROWTH     Performed at [facility]   Culture     Final   Value: NO GROWTH     Performed at [facility]   Report Status 10/08/2013 FINAL   Final  CULTURE, BLOOD (ROUTINE X 2)     Status: None   Collection Time    10/07/13  5:08 PM      Result Value Ref Range Status   Specimen Description BLOOD RIGHT ARM   Final   Special Requests BOTTLES DRAWN AEROBIC AND ANAEROBIC 10CC   Final   Culture  Setup Time     Final   Value: 10/07/2013 23:30     Performed at [facility]   Culture     Final   Value:        BLOOD CULTURE RECEIVED NO GROWTH TO DATE CULTURE WILL BE HELD FOR 5 DAYS BEFORE ISSUING A FINAL NEGATIVE REPORT     Performed at [facility]  Report Status PENDING   Incomplete  MRSA PCR SCREENING     Status: Abnormal   Collection Time    10/07/13  5:09 PM      Result Value Ref Range Status   MRSA by PCR POSITIVE (*) NEGATIVE Final   Comment:            The GeneXpert MRSA Assay (FDA     approved for NASAL specimens     only), is one component of a     comprehensive MRSA colonization     surveillance program. It is not     intended to diagnose MRSA     infection nor to guide or     monitor treatment for     MRSA infections.     RESULT CALLED TO, READ BACK BY AND VERIFIED WITH:     A.BRICKHOUSE,RN [id number] 10/07/13 M.CAMPBELL  CULTURE, BLOOD (ROUTINE X 2)     Status: None   Collection Time    10/07/13  5:14 PM      Result Value Ref Range Status   Specimen Description BLOOD LEFT ARM   Final   Special Requests     Final   Value: BOTTLES DRAWN AEROBIC AND ANAEROBIC AER 6CC,4CC ANA   Culture  Setup Time     Final   Value: 10/07/2013 23:27     Performed at [facility]   Culture     Final   Value:        BLOOD CULTURE RECEIVED NO GROWTH TO DATE CULTURE WILL BE HELD FOR 5 DAYS BEFORE ISSUING A FINAL NEGATIVE REPORT     Performed at [facility]   Report Status PENDING   Incomplete       Today   Subjective:   Anthoney Haradaichard Lem today has no headache,no chest abdominal pain,no new weakness tingling or numbness, feels much better wants to go home today.   Objective:   Blood pressure 132/80, pulse 95, temperature 97.3 F (36.3 C), temperature source Oral, resp. rate 16, height 5\' 11"  (1.803 m), weight 86.1 kg (189 lb 13.1 oz), SpO2 95.00%.   Intake/Output Summary (Last 24 hours) at 10/10/13 0939 Last data filed at 10/10/13 0938  Gross per 24 hour  Intake    480 ml  Output    450 ml  Net     30 ml    Exam Awake Alert, Oriented x 3, No new F.N deficits, Normal affect [place].AT,PERRAL Supple Neck,No JVD, No cervical lymphadenopathy appriciated.  Symmetrical Chest wall movement, Good air movement bilaterally, reduced right lower breath sounds RRR,No Gallops,Rubs or new Murmurs, No Parasternal Heave +ve B.Sounds, Abd Soft mildly distended, Non tender, No organomegaly appriciated, No rebound -guarding or rigidity. No Cyanosis, Clubbing or edema, No new Rash or bruise  Data Review   CBC w Diff: Lab Results  Component Value Date   WBC 14.4* 10/09/2013   HGB 12.2* 10/09/2013   HCT 33.8* 10/09/2013   PLT 105* 10/09/2013   LYMPHOPCT 25 10/06/2013   MONOPCT 10 10/06/2013   EOSPCT 3 10/06/2013   BASOPCT 0 10/06/2013    CMP: Lab Results  Component Value Date   NA 126* 10/10/2013   K 4.2 10/10/2013   CL 89* 10/10/2013   CO2 24 10/10/2013   BUN 26* 10/10/2013   CREATININE 0.84 10/10/2013   PROT 7.2 10/09/2013   ALBUMIN 2.0* 10/09/2013   BILITOT 2.8* 10/09/2013   ALKPHOS 87 10/09/2013   AST 54* 10/09/2013   ALT 33  10/09/2013  .   Total Time in preparing paper work, data evaluation and todays exam - 35 minutes  Leroy Sea M.D on 10/10/2013 at 9:39 AM  [facility] Office  [phone]

## 2013-10-11 ENCOUNTER — Encounter (HOSPITAL_COMMUNITY): Payer: Self-pay | Admitting: Emergency Medicine

## 2013-10-11 ENCOUNTER — Inpatient Hospital Stay (HOSPITAL_COMMUNITY)
Admission: EM | Admit: 2013-10-11 | Discharge: 2013-10-28 | DRG: 423 | Disposition: A | Discharge status: Home Health | Payer: Medicaid - Out of State | Attending: Internal Medicine | Admitting: Internal Medicine

## 2013-10-11 ENCOUNTER — Emergency Department (HOSPITAL_COMMUNITY): Payer: Medicaid - Out of State

## 2013-10-11 DIAGNOSIS — D65 Disseminated intravascular coagulation [defibrination syndrome]: Secondary | ICD-10-CM | POA: Diagnosis present

## 2013-10-11 DIAGNOSIS — F09 Unspecified mental disorder due to known physiological condition: Secondary | ICD-10-CM | POA: Diagnosis present

## 2013-10-11 DIAGNOSIS — F1021 Alcohol dependence, in remission: Secondary | ICD-10-CM | POA: Diagnosis present

## 2013-10-11 DIAGNOSIS — K7460 Unspecified cirrhosis of liver: Secondary | ICD-10-CM | POA: Diagnosis present

## 2013-10-11 DIAGNOSIS — K2210 Ulcer of esophagus without bleeding: Secondary | ICD-10-CM

## 2013-10-11 DIAGNOSIS — J9621 Acute and chronic respiratory failure with hypoxia: Secondary | ICD-10-CM | POA: Diagnosis present

## 2013-10-11 DIAGNOSIS — E871 Hypo-osmolality and hyponatremia: Secondary | ICD-10-CM | POA: Diagnosis present

## 2013-10-11 DIAGNOSIS — Z79891 Long term (current) use of opiate analgesic: Secondary | ICD-10-CM | POA: Diagnosis not present

## 2013-10-11 DIAGNOSIS — F1410 Cocaine abuse, uncomplicated: Secondary | ICD-10-CM | POA: Diagnosis present

## 2013-10-11 DIAGNOSIS — B182 Chronic viral hepatitis C: Secondary | ICD-10-CM

## 2013-10-11 DIAGNOSIS — A419 Sepsis, unspecified organism: Secondary | ICD-10-CM

## 2013-10-11 DIAGNOSIS — Z8614 Personal history of Methicillin resistant Staphylococcus aureus infection: Secondary | ICD-10-CM | POA: Diagnosis not present

## 2013-10-11 DIAGNOSIS — E875 Hyperkalemia: Secondary | ICD-10-CM | POA: Diagnosis not present

## 2013-10-11 DIAGNOSIS — Z515 Encounter for palliative care: Secondary | ICD-10-CM

## 2013-10-11 DIAGNOSIS — J948 Other specified pleural conditions: Secondary | ICD-10-CM | POA: Diagnosis present

## 2013-10-11 DIAGNOSIS — K7031 Alcoholic cirrhosis of liver with ascites: Principal | ICD-10-CM | POA: Diagnosis present

## 2013-10-11 DIAGNOSIS — D62 Acute posthemorrhagic anemia: Secondary | ICD-10-CM

## 2013-10-11 DIAGNOSIS — F419 Anxiety disorder, unspecified: Secondary | ICD-10-CM | POA: Diagnosis present

## 2013-10-11 DIAGNOSIS — J9601 Acute respiratory failure with hypoxia: Secondary | ICD-10-CM | POA: Diagnosis present

## 2013-10-11 DIAGNOSIS — R188 Other ascites: Secondary | ICD-10-CM

## 2013-10-11 DIAGNOSIS — J189 Pneumonia, unspecified organism: Secondary | ICD-10-CM

## 2013-10-11 DIAGNOSIS — K7290 Hepatic failure, unspecified without coma: Secondary | ICD-10-CM | POA: Diagnosis present

## 2013-10-11 DIAGNOSIS — R6520 Severe sepsis without septic shock: Secondary | ICD-10-CM

## 2013-10-11 DIAGNOSIS — N400 Enlarged prostate without lower urinary tract symptoms: Secondary | ICD-10-CM | POA: Diagnosis present

## 2013-10-11 DIAGNOSIS — K766 Portal hypertension: Secondary | ICD-10-CM | POA: Diagnosis present

## 2013-10-11 DIAGNOSIS — K7682 Hepatic encephalopathy: Secondary | ICD-10-CM | POA: Diagnosis present

## 2013-10-11 DIAGNOSIS — R54 Age-related physical debility: Secondary | ICD-10-CM | POA: Diagnosis present

## 2013-10-11 DIAGNOSIS — Z87891 Personal history of nicotine dependence: Secondary | ICD-10-CM | POA: Diagnosis not present

## 2013-10-11 DIAGNOSIS — Z79899 Other long term (current) drug therapy: Secondary | ICD-10-CM | POA: Diagnosis not present

## 2013-10-11 DIAGNOSIS — Z66 Do not resuscitate: Secondary | ICD-10-CM | POA: Diagnosis present

## 2013-10-11 DIAGNOSIS — J90 Pleural effusion, not elsewhere classified: Secondary | ICD-10-CM | POA: Diagnosis present

## 2013-10-11 DIAGNOSIS — E222 Syndrome of inappropriate secretion of antidiuretic hormone: Secondary | ICD-10-CM | POA: Diagnosis present

## 2013-10-11 DIAGNOSIS — F1290 Cannabis use, unspecified, uncomplicated: Secondary | ICD-10-CM | POA: Diagnosis present

## 2013-10-11 DIAGNOSIS — J918 Pleural effusion in other conditions classified elsewhere: Secondary | ICD-10-CM

## 2013-10-11 DIAGNOSIS — R0600 Dyspnea, unspecified: Secondary | ICD-10-CM

## 2013-10-11 DIAGNOSIS — R109 Unspecified abdominal pain: Secondary | ICD-10-CM

## 2013-10-11 DIAGNOSIS — R627 Adult failure to thrive: Secondary | ICD-10-CM | POA: Diagnosis present

## 2013-10-11 DIAGNOSIS — E46 Unspecified protein-calorie malnutrition: Secondary | ICD-10-CM | POA: Diagnosis present

## 2013-10-11 DIAGNOSIS — D689 Coagulation defect, unspecified: Secondary | ICD-10-CM

## 2013-10-11 DIAGNOSIS — K279 Peptic ulcer, site unspecified, unspecified as acute or chronic, without hemorrhage or perforation: Secondary | ICD-10-CM | POA: Diagnosis present

## 2013-10-11 DIAGNOSIS — F1011 Alcohol abuse, in remission: Secondary | ICD-10-CM | POA: Diagnosis present

## 2013-10-11 DIAGNOSIS — R531 Weakness: Secondary | ICD-10-CM

## 2013-10-11 DIAGNOSIS — S2242XA Multiple fractures of ribs, left side, initial encounter for closed fracture: Secondary | ICD-10-CM | POA: Diagnosis present

## 2013-10-11 DIAGNOSIS — Z9119 Patient's noncompliance with other medical treatment and regimen: Secondary | ICD-10-CM | POA: Diagnosis present

## 2013-10-11 DIAGNOSIS — N179 Acute kidney failure, unspecified: Secondary | ICD-10-CM | POA: Diagnosis present

## 2013-10-11 DIAGNOSIS — B1920 Unspecified viral hepatitis C without hepatic coma: Secondary | ICD-10-CM | POA: Diagnosis present

## 2013-10-11 DIAGNOSIS — Z833 Family history of diabetes mellitus: Secondary | ICD-10-CM

## 2013-10-11 DIAGNOSIS — K769 Liver disease, unspecified: Secondary | ICD-10-CM

## 2013-10-11 DIAGNOSIS — Z9114 Patient's other noncompliance with medication regimen: Secondary | ICD-10-CM

## 2013-10-11 DIAGNOSIS — D696 Thrombocytopenia, unspecified: Secondary | ICD-10-CM

## 2013-10-11 DIAGNOSIS — R64 Cachexia: Secondary | ICD-10-CM | POA: Diagnosis present

## 2013-10-11 DIAGNOSIS — Z91030 Bee allergy status: Secondary | ICD-10-CM

## 2013-10-11 DIAGNOSIS — R079 Chest pain, unspecified: Secondary | ICD-10-CM | POA: Diagnosis present

## 2013-10-11 DIAGNOSIS — T148XXA Other injury of unspecified body region, initial encounter: Secondary | ICD-10-CM

## 2013-10-11 LAB — CBC
HCT: 35.9 % — ABNORMAL LOW (ref 39.0–52.0)
Hemoglobin: 12.7 g/dL — ABNORMAL LOW (ref 13.0–17.0)
MCH: 31.7 pg (ref 26.0–34.0)
MCHC: 35.4 g/dL (ref 30.0–36.0)
MCV: 89.5 fL (ref 78.0–100.0)
PLATELETS: 132 10*3/uL — AB (ref 150–400)
RBC: 4.01 MIL/uL — ABNORMAL LOW (ref 4.22–5.81)
RDW: 14.8 % (ref 11.5–15.5)
WBC: 14.2 10*3/uL — ABNORMAL HIGH (ref 4.0–10.5)

## 2013-10-11 LAB — COMPREHENSIVE METABOLIC PANEL
ALT: 31 U/L (ref 0–53)
AST: 40 U/L — ABNORMAL HIGH (ref 0–37)
Albumin: 1.8 g/dL — ABNORMAL LOW (ref 3.5–5.2)
Alkaline Phosphatase: 99 U/L (ref 39–117)
Anion gap: 10 (ref 5–15)
BUN: 32 mg/dL — ABNORMAL HIGH (ref 6–23)
CALCIUM: 8.1 mg/dL — AB (ref 8.4–10.5)
CO2: 24 mEq/L (ref 19–32)
Chloride: 89 mEq/L — ABNORMAL LOW (ref 96–112)
Creatinine, Ser: 0.88 mg/dL (ref 0.50–1.35)
GFR calc non Af Amer: >90 mL/min (ref >90)
Glucose, Bld: 123 mg/dL — ABNORMAL HIGH (ref 70–99)
POTASSIUM: 4.3 meq/L (ref 3.7–5.3)
Sodium: 123 mEq/L — ABNORMAL LOW (ref 137–147)
TOTAL PROTEIN: 7 g/dL (ref 6.0–8.3)
Total Bilirubin: 3.2 mg/dL — ABNORMAL HIGH (ref 0.3–1.2)

## 2013-10-11 LAB — AMMONIA: Ammonia: 24 umol/L (ref 11–60)

## 2013-10-11 LAB — ABO/RH: ABO/RH(D): A POS

## 2013-10-11 MED ORDER — POLYETHYLENE GLYCOL 3350 17 G PO PACK
17.0000 g | PACK | Freq: Every day | ORAL | Status: DC | PRN
  Administered 2013-10-12: 17 g via ORAL
  Filled 2013-10-11: qty 1

## 2013-10-11 MED ORDER — ONDANSETRON HCL 4 MG/2ML IJ SOLN: 4.0000 mg | Freq: Four times a day (QID) | INTRAMUSCULAR | Status: DC | PRN

## 2013-10-11 MED ORDER — SPIRONOLACTONE 100 MG PO TABS
100.0000 mg | ORAL_TABLET | Freq: Two times a day (BID) | ORAL | Status: DC
  Administered 2013-10-11 – 2013-10-16 (×10): 100 mg via ORAL
  Filled 2013-10-11 (×12): qty 1

## 2013-10-11 MED ORDER — FUROSEMIDE 10 MG/ML IJ SOLN
40.0000 mg | Freq: Two times a day (BID) | INTRAMUSCULAR | Status: DC
  Administered 2013-10-11 – 2013-10-16 (×10): 40 mg via INTRAVENOUS
  Filled 2013-10-11 (×13): qty 4

## 2013-10-11 MED ORDER — SODIUM CHLORIDE 0.9 % IV SOLN: Freq: Once | INTRAVENOUS | Status: DC

## 2013-10-11 MED ORDER — HYDROMORPHONE HCL 1 MG/ML IJ SOLN
2.0000 mg | Freq: Once | INTRAMUSCULAR | Status: AC
  Administered 2013-10-11: 2 mg via INTRAVENOUS
  Filled 2013-10-11: qty 2

## 2013-10-11 MED ORDER — FUROSEMIDE 10 MG/ML IJ SOLN
40.0000 mg | Freq: Once | INTRAMUSCULAR | Status: AC
  Filled 2013-10-11: qty 4

## 2013-10-11 MED ORDER — VITAMIN B-1 100 MG PO TABS
100.0000 mg | ORAL_TABLET | Freq: Every day | ORAL | Status: DC
  Administered 2013-10-11 – 2013-10-28 (×17): 100 mg via ORAL
  Filled 2013-10-11 (×18): qty 1

## 2013-10-11 MED ORDER — MORPHINE SULFATE 2 MG/ML IJ SOLN
2.0000 mg | INTRAMUSCULAR | Status: DC | PRN
  Administered 2013-10-11 – 2013-10-16 (×25): 2 mg via INTRAVENOUS
  Filled 2013-10-11 (×26): qty 1

## 2013-10-11 MED ORDER — CLONAZEPAM 0.5 MG PO TABS
0.5000 mg | ORAL_TABLET | Freq: Every day | ORAL | Status: DC
  Administered 2013-10-11 – 2013-10-27 (×17): 0.5 mg via ORAL
  Filled 2013-10-11 (×17): qty 1

## 2013-10-11 MED ORDER — FOLIC ACID 1 MG PO TABS
1.0000 mg | ORAL_TABLET | Freq: Every day | ORAL | Status: DC
  Administered 2013-10-11 – 2013-10-28 (×16): 1 mg via ORAL
  Filled 2013-10-11 (×18): qty 1

## 2013-10-11 MED ORDER — SODIUM CHLORIDE 0.9 % IJ SOLN
3.0000 mL | Freq: Two times a day (BID) | INTRAMUSCULAR | Status: DC
  Administered 2013-10-11 – 2013-10-14 (×6): 3 mL via INTRAVENOUS
  Administered 2013-10-15: 21:00:00 via INTRAVENOUS
  Administered 2013-10-16 – 2013-10-25 (×16): 3 mL via INTRAVENOUS
  Administered 2013-10-26: 21:00:00 via INTRAVENOUS
  Administered 2013-10-26 – 2013-10-27 (×3): 3 mL via INTRAVENOUS

## 2013-10-11 MED ORDER — ALBUTEROL SULFATE (2.5 MG/3ML) 0.083% IN NEBU
3.0000 mL | INHALATION_SOLUTION | Freq: Four times a day (QID) | RESPIRATORY_TRACT | Status: DC | PRN
  Administered 2013-10-12: 3 mL via RESPIRATORY_TRACT
  Filled 2013-10-11: qty 3

## 2013-10-11 MED ORDER — GUAIFENESIN-DM 100-10 MG/5ML PO SYRP
5.0000 mL | ORAL_SOLUTION | ORAL | Status: DC | PRN
  Administered 2013-10-21: 5 mL via ORAL
  Filled 2013-10-11 (×2): qty 5

## 2013-10-11 MED ORDER — LACTULOSE 10 GM/15ML PO SOLN
30.0000 g | Freq: Three times a day (TID) | ORAL | Status: DC
  Administered 2013-10-11 – 2013-10-22 (×30): 30 g via ORAL
  Filled 2013-10-11 (×38): qty 45

## 2013-10-11 MED ORDER — TAMSULOSIN HCL 0.4 MG PO CAPS
0.4000 mg | ORAL_CAPSULE | Freq: Every day | ORAL | Status: DC
  Administered 2013-10-12 – 2013-10-28 (×16): 0.4 mg via ORAL
  Filled 2013-10-11 (×17): qty 1

## 2013-10-11 MED ORDER — ONDANSETRON HCL 4 MG PO TABS: 4.0000 mg | ORAL_TABLET | Freq: Four times a day (QID) | ORAL | Status: DC | PRN

## 2013-10-11 MED ORDER — DOCUSATE SODIUM 100 MG PO CAPS
100.0000 mg | ORAL_CAPSULE | Freq: Two times a day (BID) | ORAL | Status: DC | PRN
  Administered 2013-10-27: 100 mg via ORAL
  Filled 2013-10-11: qty 1

## 2013-10-11 MED ORDER — RIFAXIMIN 550 MG PO TABS
550.0000 mg | ORAL_TABLET | Freq: Two times a day (BID) | ORAL | Status: DC
  Administered 2013-10-11 – 2013-10-22 (×22): 550 mg via ORAL
  Filled 2013-10-11 (×26): qty 1

## 2013-10-11 MED ORDER — FENTANYL 25 MCG/HR TD PT72
25.0000 ug | MEDICATED_PATCH | TRANSDERMAL | Status: DC
  Administered 2013-10-11 – 2013-10-26 (×6): 25 ug via TRANSDERMAL
  Filled 2013-10-11 (×6): qty 1

## 2013-10-11 MED ORDER — CLONAZEPAM 0.5 MG PO TABS: 0.5000 mg | ORAL_TABLET | Freq: Every day | ORAL | Status: DC

## 2013-10-11 NOTE — ED Provider Notes (Signed)
CSN: [id number]     Arrival date & time 10/11/13  1223 History   First MD Initiated Contact with Patient 10/11/13 [phone]     Chief Complaint  Patient presents with  . Ascites  . Pain     (Consider location/radiation/quality/duration/timing/severity/associated sxs/prior Treatment) The history is provided by the patient.  Wyvonnia DuskyRichard L Bangerter is a 53 y.o. male hx of cirrhosis with ascites, known R pleural effusion here with worsening shortness of breath, chest pain. Was admitted to the hospital several days ago and was discharged yesterday. Had R pleural effusion and patient was seen by pulmonology was didn't think patient need another thoracentesis. Went home and had more pain and shortness of breath. He was coughing and felt more pain on the left side of his ribs. Took oxycodone with minimal relief.    Past Medical History  Diagnosis Date  . Cirrhosis 08/2013    ETOH and Hep C  . Ascites 09/2013  . Thrombocytopenia 09/2013  . Coagulopathy 09/2013    due to hepatic dysfunction.   . Pneumonia 09/2013  . Chronic alcoholism   . Hepatitis C dx'd ~ 2000  . Anxiety   . Inguinal hernia     repaired in [place] 08/2013   Past Surgical History  Procedure Laterality Date  . Incision and drainage perirectal abscess  1980's  . Inguinal hernia repair Right 08/2013    at [facility] in [place].    Family History  Problem Relation Age of Onset  . CAD    . Hypertension    . Diabetes Mother   . Hypertension Mother   . Cancer Father   . Cancer Maternal Grandfather    History  Substance Use Topics  . Smoking status: Former Smoker -- 10 years    Types: Cigarettes  . Smokeless tobacco: Never Used     Comment: "smoked 1/2 pack/wk when I did smoke"  . Alcohol Use: Yes     Comment: 10/07/2013 "I've had 2 drinks since 08/2013; I"ve quit"    Review of Systems  Respiratory: Positive for shortness of breath.   All other systems reviewed and are negative.     Allergies  Bee venom  Home Medications    Prior to Admission medications   Medication Sig Start Date End Date Taking? Authorizing Provider  clonazePAM (KLONOPIN) 0.5 MG tablet Take 1 tablet (0.5 mg total) by mouth daily as needed for anxiety. 10/09/13   Leroy SeaPrashant K Singh, MD  docusate sodium (COLACE) 100 MG capsule Take 1 capsule (100 mg total) by mouth 2 (two) times daily as needed for mild constipation. Hold if you have diarrhea. 10/01/13   Stephani PoliceMarianne L York, PA-C  folic acid (FOLVITE) 1 MG tablet Take 1 tablet (1 mg total) by mouth daily. 09/09/13   Clydia LlanoMutaz Elmahi, MD  furosemide (LASIX) 40 MG tablet Take 2 tablets (80 mg total) by mouth 2 (two) times daily. 10/10/13   Leroy SeaPrashant K Singh, MD  lactulose (CHRONULAC) 10 GM/15ML solution Take 45 mLs (30 g total) by mouth daily. 10/01/13   Stephani PoliceMarianne L York, PA-C  Multiple Vitamin (MULTIVITAMIN WITH MINERALS) TABS tablet Take 1 tablet by mouth daily. 09/09/13   Clydia LlanoMutaz Elmahi, MD  oxyCODONE (ROXICODONE) 5 MG immediate release tablet Take 1 tablet (5 mg total) by mouth every 4 (four) hours as needed for severe pain. 10/09/13   Leroy SeaPrashant K Singh, MD  spironolactone (ALDACTONE) 100 MG tablet Take 1 tablet (100 mg total) by mouth 2 (two) times daily. 10/10/13   Prashant K  Thedore MinsSingh, MD  tamsulosin (FLOMAX) 0.4 MG CAPS capsule Take 1 capsule (0.4 mg total) by mouth daily. 09/23/13   Tora KindredMarianne L York, PA-C  thiamine 100 MG tablet Take 1 tablet (100 mg total) by mouth daily. 09/09/13   Clydia LlanoMutaz Elmahi, MD   BP 106/66  Pulse 82  Temp(Src) 97.8 F (36.6 C) (Oral)  Resp 21  SpO2 92% Physical Exam  Nursing note and vitals reviewed. Constitutional: He is oriented to person, place, and time.  Chronically ill, uncomfortable   HENT:  Head: Normocephalic.  Mouth/Throat: Oropharynx is clear and moist.  Eyes: Conjunctivae are normal. Pupils are equal, round, and reactive to light.  Neck: Normal range of motion. Neck supple.  Cardiovascular: Regular rhythm and normal heart sounds.   Tachycardic   Pulmonary/Chest:  Tachypneic,  no breath sounds R side   Abdominal:  Distended, + fluid wave   Musculoskeletal:  2+ edema   Neurological: He is alert and oriented to person, place, and time.  Skin: Skin is warm and dry.  Psychiatric: He has a normal mood and affect. His behavior is normal. Judgment and thought content normal.    ED Course  Procedures (including critical care time) Labs Review Labs Reviewed  CBC - Abnormal; Notable for the following:    WBC 14.2 (*)    RBC 4.01 (*)    Hemoglobin 12.7 (*)    HCT 35.9 (*)    Platelets 132 (*)    All other components within normal limits  COMPREHENSIVE METABOLIC PANEL - Abnormal; Notable for the following:    Sodium 123 (*)    Chloride 89 (*)    Glucose, Bld 123 (*)    BUN 32 (*)    Calcium 8.1 (*)    Albumin 1.8 (*)    AST 40 (*)    Total Bilirubin 3.2 (*)    All other components within normal limits    Imaging Review Dg Ribs Unilateral W/chest Left  10/11/2013   CLINICAL DATA:  Shortness breath. Cough. Ascites. Hepatic insufficiency. Left rib and chest pain.  EXAM: LEFT RIBS AND CHEST - 3+ VIEW  COMPARISON:  10/07/2013  FINDINGS: Nondisplaced acute fractures are seen involving the left posterior lateral seventh and eighth ribs. There is no evidence of pneumothorax.  Large right pleural effusion is further increase in size now resulting complete opacification of the right hemithorax. Left lung remains grossly clear. Heart size is stable.  IMPRESSION: Nondisplaced fractures of the left posterior seventh and eighth ribs. No evidence of pneumothorax.  Increased size of large right pleural effusion causing complete opacification of right hemithorax.   Electronically Signed   By: Myles RosenthalJohn  Stahl M.D.   On: 10/11/2013 14:27     EKG Interpretation None      MDM   Final diagnoses:  None   Wyvonnia DuskyRichard L Doughtie is a 53 y.o. male here with worsening shortness of breath. He is 90% RA, about 94% on 2L. Patient has worsening R pleural effusion, likely from cirrhosis. I  called Dr. Sherene SiresWert from pulmonary, who will evaluate patient for possible chest tube or thoracentesis. Hyponatremic likely from aldactone and lasix use. He recommend fluid restriction. Will readmit.       Richardean Canalavid H Ellyse Rotolo, MD 10/11/13 [phone]

## 2013-10-11 NOTE — ED Notes (Signed)
Pt [facility] for [facility], ascites, sob. Pt reports coughing and feeling a pop to left ribs and needs more pain meds to keep his pain under control.

## 2013-10-11 NOTE — Consult Note (Addendum)
PULMONARY / CRITICAL CARE MEDICINE   Name: Xavier Valentine MRN: [id number] DOB: 07-09-1960    ADMISSION DATE:  10/11/2013 CONSULTATION DATE:  10/11/2013  REFERRING MD :  Thedore Mins  CHIEF COMPLAINT:  SOB  INITIAL PRESENTATION: dyspnea   STUDIES:  10/10  DG ribs Increased size of large right pleural effusion causing complete  opacification of right hemithorax/ multiple L rib fx   SIGNIFICANT EVENTS: 9/20 paracentesis with 3.6 liters yellow tinged fluid removed.  9/26 thoracentesis  X 1.4 liters- transudate, hepatic hydrothorax  10/5 to ED with abd distention, improved with BM and discharged  10/6 to ED with SOB and chest pain, admitted with effusion.   10/10 readmitted for acute dyspnea and reaccumulation of fluid  HISTORY OF PRESENT ILLNESS:  Xavier Valentine is a 53 y.o M with pmh of hepC/ETOH cirrhosis, thrombocytopenia, hx of right sided transudative pleural effusions who presents with left sided chest/rib cage pain. CXR in the ED demonstrated a reaccumulated right sided pleural effusion in addition to hyponatremia (both chronic in nature)  Pt recently underwent a thoracentesis 09/27/13  X 1.4 liters   and was discharged  10/9. During admission also had abdominal US which demonstrated minimal ascites. Decision at that time made to d/c on high dose of lasix and not repeat thora. Per report had difficultly getting diuretics and pain meds after discharge which prompted return to ED. Of note pt has not yet seen hep clinic here. Has upcoming appointment with [facility].   PAST MEDICAL HISTORY :   has a past medical history of Cirrhosis (08/2013); Ascites (09/2013); Thrombocytopenia (09/2013); Coagulopathy (09/2013); Pneumonia (09/2013); Chronic alcoholism; Hepatitis C (dx'd ~ 2000); Anxiety; and Inguinal hernia.  has past surgical history that includes Incision and drainage perirectal abscess (1980's) and Inguinal hernia repair (Right, 08/2013). Prior to Admission medications   Medication Sig Start Date  End Date Taking? Authorizing Provider  albuterol (PROVENTIL HFA;VENTOLIN HFA) 108 (90 BASE) MCG/ACT inhaler Inhale 2 puffs into the lungs daily as needed for wheezing or shortness of breath.   Yes Historical Provider, MD  clonazePAM (KLONOPIN) 0.5 MG tablet Take 1 tablet (0.5 mg total) by mouth daily as needed for anxiety. 10/09/13  Yes Leroy Sea, MD  docusate sodium (COLACE) 100 MG capsule Take 1 capsule (100 mg total) by mouth 2 (two) times daily as needed for mild constipation. Hold if you have diarrhea. 10/01/13  Yes Tora Kindred York, PA-C  folic acid (FOLVITE) 1 MG tablet Take 1 tablet (1 mg total) by mouth daily. 09/09/13  Yes Clydia Llano, MD  furosemide (LASIX) 40 MG tablet Take 2 tablets (80 mg total) by mouth 2 (two) times daily. 10/10/13  Yes Leroy Sea, MD  lactulose (CHRONULAC) 10 GM/15ML solution Take 45 mLs (30 g total) by mouth daily. 10/01/13  Yes Marianne L York, PA-C  Multiple Vitamin (MULTIVITAMIN WITH MINERALS) TABS tablet Take 1 tablet by mouth daily. 09/09/13  Yes Clydia Llano, MD  oxyCODONE (ROXICODONE) 5 MG immediate release tablet Take 1 tablet (5 mg total) by mouth every 4 (four) hours as needed for severe pain. 10/09/13  Yes Leroy Sea, MD  spironolactone (ALDACTONE) 100 MG tablet Take 1 tablet (100 mg total) by mouth 2 (two) times daily. 10/10/13  Yes Leroy Sea, MD  tamsulosin (FLOMAX) 0.4 MG CAPS capsule Take 1 capsule (0.4 mg total) by mouth daily. 09/23/13  Yes Tora Kindred York, PA-C  thiamine 100 MG tablet Take 1 tablet (100 mg total) by mouth daily.  09/09/13  Yes Clydia LlanoMutaz Elmahi, MD   Allergies  Allergen Reactions  . Bee Venom Swelling    FAMILY HISTORY:  indicated that his mother is alive. He indicated that his father is deceased. He indicated that both of his sisters are alive.  SOCIAL HISTORY:  reports that he has quit smoking. His smoking use included Cigarettes. He smoked 0.00 packs per day for 10 years. He has never used smokeless tobacco. He  reports that he drinks alcohol. He reports that he uses illicit drugs (Marijuana and Cocaine).  REVIEW OF SYSTEMS:  See above; no gross bleeding episodes; left sided chest pain   SUBJECTIVE: SOB, in pain but comfortable   VITAL SIGNS: Temp:  [97.7 F (36.5 C)-97.8 F (36.6 C)] 97.8 F (36.6 C) (10/10 1601) Pulse Rate:  [70-92] 84 (10/10 1739) Resp:  [18-22] 18 (10/10 1739) BP: (90-123)/(50-71) 118/71 mmHg (10/10 1739) SpO2:  [87 %-95 %] 89 % (10/10 1739) HEMODYNAMICS:   VENTILATOR SETTINGS:   INTAKE / OUTPUT: No intake or output data in the 24 hours ending 10/11/13 1743  PHYSICAL EXAMINATION: General:  Ill appearing; sickly  Neuro: awake alert, tangential but oriented X3 HEENT:  PERRL (pinpoint); scleral icterus  Cardiovascular:  RRR, s1/2  Lungs:  Decreased breath sounds on right Abdomen:  Full, distended, hypoactive BS Musculoskeletal:  Pain on palpation of rib cage; full ROM  Skin: jaundice   LABS:  CBC  Recent Labs Lab 10/08/13 0704 10/09/13 1000 10/11/13 1300  WBC 15.8* 14.4* 14.2*  HGB 12.6* 12.2* 12.7*  HCT 36.4* 33.8* 35.9*  PLT 77* 105* 132*   Coag's  Recent Labs Lab 10/06/13 0220 10/07/13 1714  INR 1.95* 1.95*   BMET  Recent Labs Lab 10/09/13 1000 10/10/13 0432 10/11/13 1300  NA 125* 126* 123*  K 3.9 4.2 4.3  CL 91* 89* 89*  CO2 25 24 24   BUN 25* 26* 32*  CREATININE 0.81 0.84 0.88  GLUCOSE 140* 111* 123*   Electrolytes  Recent Labs Lab 10/07/13 1714  10/09/13 1000 10/10/13 0432 10/11/13 1300  CALCIUM  --   < > 8.0* 8.0* 8.1*  MG 1.8  --  1.7  --   --   < > = values in this interval not displayed. Sepsis Markers No results found for this basename: LATICACIDVEN, PROCALCITON, O2SATVEN,  in the last 168 hours ABG No results found for this basename: PHART, PCO2ART, PO2ART,  in the last 168 hours Liver Enzymes  Recent Labs Lab 10/08/13 0704 10/09/13 1000 10/11/13 1300  AST 50* 54* 40*  ALT 40 33 31  ALKPHOS 88 87 99   BILITOT 6.0* 2.8* 3.2*  ALBUMIN 2.2* 2.0* 1.8*   Cardiac Enzymes  Recent Labs Lab 10/07/13 1029  PROBNP 241.7*   Glucose No results found for this basename: GLUCAP,  in the last 168 hours  Imaging No results found.   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 Did not take P:   Potential thora if acute dyspnea, no acute indication  O2 to maintain sats Continued aggressive diuresis  Accurate as possible  i/os  Consider TIPS procedure to relieve pressure and reaccumulation  No chest tube Nebs prn  CARDIOVASCULAR A: No acute process P:  Cont monitoring on tele BP monitoring in light of diuresis   RENAL A:  Chronic hyponatremia  Third spacing  BPH P:   Trend  BMETs Correct sodium slowly given chronicity by diuresis with lasix plus water restriction/ consider samsca H2O restriction   GASTROINTESTINAL A:  ETOH/hepC cirrhosis Hepatic encephalopathy MELD score: 18 P:   Spiro at least 200mg , titrate up to 400 Cont lasix 80 BID  Lactulose titrate to BMs Need Rifaximin  CMET to trend bili/NH3 Needs to f[facility] Potential transplant needed in near future   HEMATOLOGIC A:  Thrombocytopenia INR 1.9 P:  Will give FFP tonight Repeat INR in the morning  SCDs  INFECTIOUS A:  No signs of SBP Leukocytosis (chronic)  P:   Consider BCx2 if spikes fever   ENDOCRINE A:  No acute issues  P:   Monitor on BMET  NEUROLOGIC A:  Would monitor for DTs P:   CIWA   Charlane FerrettiMelanie C Marsh, MD Family Medicine PGY-2 Please page or call with questions   Chart and xrays reviewd pt seen and examined.  He did improved sob p last tap but gradually worse since ? Related to not taking meds and now with sob at rest and new hypoxemia with exam c/w decreased bs on R and recurrent ascites as well.    agree  with plan as outlined > admit on 02 and correct coagulopathy with FFP then re-thoracentesis in am  - note this pt will be very difficult to take care of as an outpatient if can't assure that he's taking the meds he's supposed to be and will need family's help to assure this happens or consider some form or chronic care unit if pt would agree to go   Sandrea HughsMichael Naheem Mosco, MD Pulmonary and Critical Care Medicine [facility] Cell [phone] After 5:30 PM or weekends, call [phone]

## 2013-10-11 NOTE — H&P (Signed)
Patient Demographics  Xavier Valentine, is a 53 y.o. male  MRN: [id number]   DOB - [birth year]  Admit Date - 10/11/2013  Outpatient Primary MD for the patient is [name], MD   With History of -  Past Medical History  Diagnosis Date  . Cirrhosis 08/2013    ETOH and Hep C  . Ascites 09/2013  . Thrombocytopenia 09/2013  . Coagulopathy 09/2013    due to hepatic dysfunction.   . Pneumonia 09/2013  . Chronic alcoholism   . Hepatitis C dx'd ~ 2000  . Anxiety   . Inguinal hernia     repaired in [place] 08/2013      Past Surgical History  Procedure Laterality Date  . Incision and drainage perirectal abscess  1980's  . Inguinal hernia repair Right 08/2013    at [facility] in [place].     in for   Chief Complaint  Patient presents with  . Ascites  . Pain     HPI  Xavier Valentine  is a 53 y.o. male, who has recently relocated here from [place] few weeks ago with history of hepatitis C and alcoholic cirrhosis which is advanced, thrombocytopenia, recurrent right-sided transudative sympathetic pleural effusion for which she underwent thoracentesis few weeks ago by pulmonary group here, quiet lipids he due to cirrhosis, ascites, who was discharged yesterday for right-sided pleural effusion comes back with chief complaints of left-sided rib cage pain which is chronic and he initially came to get more pain medications for his chronic left rib cage pain, however x-ray in the ER was done to evaluate for left-sided rib fractures however this showed worsening of his right-sided pleural effusion with near complete whiteout.   Patient apparently could not get his diuretic prescriptions refilled yesterday apparently his prescriptions were sent to [facility] pharmacy which he was informed about but he showed up at [facility] pharmacy,  says that he is mostly bothered with his left-sided chest wall/rib cage pain. Note last admission he underwent an abdominal ultrasound 2 days ago which showed minimal ascites, he was seen by pulmonary physicians and case was discussed in detail with them, per pulmonary he was to be discharged on higher dose Lasix which was done, they decided not to do a repeat thoracentesis last admission as oral diuretics were thought to be mainstay of his treatment. His case was discussed with pulmonary physician Dr. Marchelle Gearing yesterday who at wife that he could be discharged with outpatient followup with him in the office. However he now presents with left-sided rib cage pain which actually is chronic with an incidental finding of worsening right-sided pleural effusion. I was called to admit the patient for worsening pleural effusion with hyponatremia which also is chronic.    Patient currently denies any headache fever chills, no cough phlegm, no abdominal pain, he also forgot to take his lactulose and morning diuretic, denies any focal weakness. No blood in stool or urine. He denies taking any treatment for  hep C so far, he has a pending appointment [place] hepat[place] clinic [place] and with [facility] as well.    Review of Systems    In addition to the HPI above,  No Fever-chills, No Headache, No changes with Vision or hearing, No problems swallowing food or Liquids, +ve L sided rib cage pain, Cough or Shortness of Breath, No Abdominal pain, No Nausea or Vommitting, Bowel movements are regular, No Blood in stool or Urine, No dysuria, No new skin rashes or bruises, No new joints pains-aches,  No new weakness, tingling, numbness in any extremity, No recent weight gain or loss, No polyuria, polydypsia or polyphagia, No significant Mental Stressors.  A full 10 point Review of Systems was done, except as stated above, all other Review of Systems were negative.   Social History History  Substance Use  Topics  . Smoking status: Former Smoker -- 10 years    Types: Cigarettes  . Smokeless tobacco: Never Used     Comment: "smoked 1/2 pack/wk when I did smoke"  . Alcohol Use: Yes     Comment: 10/07/2013 "I've had 2 drinks since 08/2013; I"ve quit"      Family History Family History  Problem Relation Age of Onset  . CAD    . Hypertension    . Diabetes Mother   . Hypertension Mother   . Cancer Father   . Cancer Maternal Grandfather       Prior to Admission medications   Medication Sig Start Date End Date Taking? Authorizing Provider  clonazePAM (KLONOPIN) 0.5 MG tablet Take 1 tablet (0.5 mg total) by mouth daily as needed for anxiety. 10/09/13   Leroy SeaPrashant K Uri Turnbough, MD  docusate sodium (COLACE) 100 MG capsule Take 1 capsule (100 mg total) by mouth 2 (two) times daily as needed for mild constipation. Hold if you have diarrhea. 10/01/13   Stephani PoliceMarianne L York, PA-C  folic acid (FOLVITE) 1 MG tablet Take 1 tablet (1 mg total) by mouth daily. 09/09/13   Clydia LlanoMutaz Elmahi, MD  furosemide (LASIX) 40 MG tablet Take 2 tablets (80 mg total) by mouth 2 (two) times daily. 10/10/13   Leroy SeaPrashant K Finas Delone, MD  lactulose (CHRONULAC) 10 GM/15ML solution Take 45 mLs (30 g total) by mouth daily. 10/01/13   Stephani PoliceMarianne L York, PA-C  Multiple Vitamin (MULTIVITAMIN WITH MINERALS) TABS tablet Take 1 tablet by mouth daily. 09/09/13   Clydia LlanoMutaz Elmahi, MD  oxyCODONE (ROXICODONE) 5 MG immediate release tablet Take 1 tablet (5 mg total) by mouth every 4 (four) hours as needed for severe pain. 10/09/13   Leroy SeaPrashant K Subrina Vecchiarelli, MD  spironolactone (ALDACTONE) 100 MG tablet Take 1 tablet (100 mg total) by mouth 2 (two) times daily. 10/10/13   Leroy SeaPrashant K Dusty Raczkowski, MD  tamsulosin (FLOMAX) 0.4 MG CAPS capsule Take 1 capsule (0.4 mg total) by mouth daily. 09/23/13   Tora KindredMarianne L York, PA-C  thiamine 100 MG tablet Take 1 tablet (100 mg total) by mouth daily. 09/09/13   Clydia LlanoMutaz Elmahi, MD    Allergies  Allergen Reactions  . Bee Venom Swelling    Physical  Exam  Vitals  Blood pressure 106/66, pulse 82, temperature 97.8 F (36.6 C), temperature source Oral, resp. rate 21, SpO2 92.00%.   1. General middle aged white male lying in bed in NAD,    2. Normal affect and insight, Not Suicidal or Homicidal, Awake   Oriented X 2  3. No F.N deficits, ALL C.Nerves Intact, Strength 5/5 all 4 extremities, Sensation intact  all 4 extremities, Plantars down going.  4. Ears and Eyes appear Normal, Conjunctivae clear, PERRLA. Moist Oral Mucosa.  5. Supple Neck, No JVD, No cervical lymphadenopathy appriciated, No Carotid Bruits.  6. Symmetrical Chest wall movement, reduced R sided breath sounds  7. RRR, No Gallops, Rubs or Murmurs, No Parasternal Heave.  8. Positive Bowel Sounds, Abdomen Soft but ++ distended, No tenderness, No organomegaly appriciated,No rebound -guarding or rigidity.  9.  No Cyanosis, Normal Skin Turgor, No Skin Rash or Bruise. +ve trace edema  10. Good muscle tone,  joints appear normal , no effusions, Normal ROM.  11. No Palpable Lymph Nodes in Neck or Axillae     Data Review  CBC  Recent Labs Lab 10/06/13 0220 10/07/13 1029 10/08/13 0704 10/09/13 1000 10/11/13 1300  WBC 8.4 21.1* 15.8* 14.4* 14.2*  HGB 12.7* 13.1 12.6* 12.2* 12.7*  HCT 36.6* 37.0* 36.4* 33.8* 35.9*  PLT 91* 84* 77* 105* 132*  MCV 91.5 89.8 91.7 89.2 89.5  MCH 31.8 31.8 31.7 32.2 31.7  MCHC 34.7 35.4 34.6 36.1* 35.4  RDW 14.8 14.9 14.9 14.4 14.8  LYMPHSABS 2.1  --   --   --   --   MONOABS 0.8  --   --   --   --   EOSABS 0.3  --   --   --   --   BASOSABS 0.0  --   --   --   --    ------------------------------------------------------------------------------------------------------------------  Chemistries   Recent Labs Lab 10/06/13 0220 10/07/13 1029 10/07/13 1714 10/08/13 0704 10/09/13 1000 10/10/13 0432 10/11/13 1300  NA 129* 125*  --  127* 125* 126* 123*  K 4.3 4.1  --  3.9 3.9 4.2 4.3  CL 94* 91*  --  93* 91* 89* 89*  CO2 26  22  --  25 25 24 24   GLUCOSE 88 104*  --  111* 140* 111* 123*  BUN 21 21  --  25* 25* 26* 32*  CREATININE 0.84 0.90  --  0.77 0.81 0.84 0.88  CALCIUM 8.2* 8.4  --  8.6 8.0* 8.0* 8.1*  MG  --   --  1.8  --  1.7  --   --   AST 63*  --   --  50* 54*  --  40*  ALT 44  --   --  40 33  --  31  ALKPHOS 99  --   --  88 87  --  99  BILITOT 3.8*  --   --  6.0* 2.8*  --  3.2*   ------------------------------------------------------------------------------------------------------------------ CrCl is unknown because both a height and weight (above a minimum accepted value) are required for this calculation. ------------------------------------------------------------------------------------------------------------------ No results found for this basename: TSH, T4TOTAL, FREET3, T3FREE, THYROIDAB,  in the last 72 hours   Coagulation profile  Recent Labs Lab 10/06/13 0220 10/07/13 1714  INR 1.95* 1.95*   ------------------------------------------------------------------------------------------------------------------- No results found for this basename: DDIMER,  in the last 72 hours -------------------------------------------------------------------------------------------------------------------  Cardiac Enzymes No results found for this basename: CK, CKMB, TROPONINI, MYOGLOBIN,  in the last 168 hours ------------------------------------------------------------------------------------------------------------------ No components found with this basename: POCBNP,    ---------------------------------------------------------------------------------------------------------------  Urinalysis    Component Value Date/Time   COLORURINE AMBER* 10/07/2013 1502   APPEARANCEUR CLEAR 10/07/2013 1502   LABSPEC 1.018 10/07/2013 1502   PHURINE 5.5 10/07/2013 1502   GLUCOSEU NEGATIVE 10/07/2013 1502   HGBUR NEGATIVE 10/07/2013 1502   BILIRUBINUR NEGATIVE 10/07/2013 1502   KETONESUR  NEGATIVE 10/07/2013 1502    PROTEINUR NEGATIVE 10/07/2013 1502   UROBILINOGEN 0.2 10/07/2013 1502   NITRITE NEGATIVE 10/07/2013 1502   LEUKOCYTESUR NEGATIVE 10/07/2013 1502    ----------------------------------------------------------------------------------------------------------------  Imaging results:   Dg Ribs Unilateral W/chest Left  10/11/2013   CLINICAL DATA:  Shortness breath. Cough. Ascites. Hepatic insufficiency. Left rib and chest pain.  EXAM: LEFT RIBS AND CHEST - 3+ VIEW  COMPARISON:  10/07/2013  FINDINGS: Nondisplaced acute fractures are seen involving the left posterior lateral seventh and eighth ribs. There is no evidence of pneumothorax.  Large right pleural effusion is further increase in size now resulting complete opacification of the right hemithorax. Left lung remains grossly clear. Heart size is stable.  IMPRESSION: Nondisplaced fractures of the left posterior seventh and eighth ribs. No evidence of pneumothorax.  Increased size of large right pleural effusion causing complete opacification of right hemithorax.   Electronically Signed   By: Myles RosenthalJohn  Stahl M.D.   On: 10/11/2013 14:27         Assessment & Plan   1. Recurrent transudative sympathetic right-sided pleural effusion due to underlying ascites - missed one dose of Lasix and Aldactone as he went to the wrong pharmacy to pick prescriptions, however certainly missed one dose of diuretic cannot explain the rapid worsening of his pleural effusion. He was seen by pulmonary 2 days ago, I have requested them to see him again, per pulmonary no thoracentesis last admission, although thoracentesis is limited value is chances of recurrence are extremely high will request him to consider thoracentesis in the light of complete white out and now oxygen requirement. Will switch his Lasix to IV, continue oral Aldactone, will place him on fluid restriction, apply condom catheter for strict intake and output monitoring. Oxygen nebulizer treatments as needed and  close monitoring on telemetry.    2. Hepatitis C and alcoholic cirrhosis. This is quite advanced, he appears quite cachectic and frail, does have thrombocytopenia with INR of 1.95, prognosis is poor, he has been set up with local hepatology clinic along with lower GI. Post discharge she will follow with them. He reports he quit drinking 2 months ago. Once is past or close to 6 month time frame he could be placed on transplant list. Also needs to follow with ID for hep C treatment which he has never taken so far.    3. Hyponatremia. Secondary to ascites. On the IV Lasix and oral Aldactone, free water restriction, monitor closely.     4. Hepatic encephalopathy. He missed his lactulose dose yesterday and today, will check ammonia, start lactulose immediately along with Xifaxan, for now clear liquid diet with aspiration precautions and monitor.     5. BPH. Place on Flomax.     DVT Prophylaxis  SCDs (auto anticoagulated with INR 1.9 due to cirrhosis)    AM Labs Ordered, also please review Full Orders  Family Communication: Admission, patients condition and plan of care including tests being ordered have been discussed with the patient and mother who indicate understanding and agree with the plan and Code Status.  Code Status DNR Likely DC to     Condition GUARDED    Time spent in minutes : 35    Myrian Botello K M.D on 10/11/2013 at 5:14 PM  Between 7am to 7pm - Pager - [phone]  After 7pm go to [email or web address] - password [email or web address]  And look for the night coverage person covering me after hours  [facility] Office  [phone]

## 2013-10-11 NOTE — ED Notes (Signed)
Pt states he has pain in left sided ribs 10/10.  Pt abdomen ascites.  Pt states he seperated his ribs when coughing 8 am.

## 2013-10-12 ENCOUNTER — Inpatient Hospital Stay (HOSPITAL_COMMUNITY): Payer: Medicaid - Out of State

## 2013-10-12 LAB — CBC
HEMATOCRIT: 34.1 % — AB (ref 39.0–52.0)
HEMOGLOBIN: 12.1 g/dL — AB (ref 13.0–17.0)
MCH: 32 pg (ref 26.0–34.0)
MCHC: 35.5 g/dL (ref 30.0–36.0)
MCV: 90.2 fL (ref 78.0–100.0)
Platelets: 141 10*3/uL — ABNORMAL LOW (ref 150–400)
RBC: 3.78 MIL/uL — ABNORMAL LOW (ref 4.22–5.81)
RDW: 15 % (ref 11.5–15.5)
WBC: 13.8 10*3/uL — AB (ref 4.0–10.5)

## 2013-10-12 LAB — COMPREHENSIVE METABOLIC PANEL
ALK PHOS: 96 U/L (ref 39–117)
ALT: 31 U/L (ref 0–53)
ANION GAP: 9 (ref 5–15)
AST: 42 U/L — ABNORMAL HIGH (ref 0–37)
Albumin: 2.1 g/dL — ABNORMAL LOW (ref 3.5–5.2)
BILIRUBIN TOTAL: 4.1 mg/dL — AB (ref 0.3–1.2)
BUN: 36 mg/dL — AB (ref 6–23)
CHLORIDE: 86 meq/L — AB (ref 96–112)
CO2: 26 meq/L (ref 19–32)
CREATININE: 0.89 mg/dL (ref 0.50–1.35)
Calcium: 8.8 mg/dL (ref 8.4–10.5)
GFR calc Af Amer: >90 mL/min (ref >90)
GFR calc non Af Amer: >90 mL/min (ref >90)
Glucose, Bld: 125 mg/dL — ABNORMAL HIGH (ref 70–99)
Potassium: 4.5 mEq/L (ref 3.7–5.3)
Sodium: 121 mEq/L — CL (ref 137–147)
Total Protein: 7 g/dL (ref 6.0–8.3)

## 2013-10-12 LAB — BLOOD GAS, ARTERIAL
Acid-base deficit: 0.1 mmol/L (ref 0.0–2.0)
BICARBONATE: 24 meq/L (ref 20.0–24.0)
Drawn by: 13898
O2 Content: 4 L/min
O2 Saturation: 92.3 %
PCO2 ART: 38.1 mmHg (ref 35.0–45.0)
PO2 ART: 64.7 mmHg — AB (ref 80.0–100.0)
Patient temperature: 97.7
TCO2: 25.2 mmol/L (ref 0–100)
pH, Arterial: 7.413 (ref 7.350–7.450)

## 2013-10-12 LAB — BODY FLUID CELL COUNT WITH DIFFERENTIAL
Eos, Fluid: 0 %
Lymphs, Fluid: 39 %
Monocyte-Macrophage-Serous Fluid: 7 % — ABNORMAL LOW (ref 50–90)
NEUTROPHIL FLUID: 54 % — AB (ref 0–25)
WBC FLUID: 2164 uL — AB (ref 0–1000)

## 2013-10-12 LAB — GLUCOSE, SEROUS FLUID: GLUCOSE FL: 102 mg/dL

## 2013-10-12 LAB — LACTATE DEHYDROGENASE, PLEURAL OR PERITONEAL FLUID: LD, Fluid: 408 U/L — ABNORMAL HIGH (ref 3–23)

## 2013-10-12 LAB — PROTEIN, BODY FLUID: Total protein, fluid: 3.2 g/dL

## 2013-10-12 MED ORDER — SODIUM CHLORIDE 1 G PO TABS
1.0000 g | ORAL_TABLET | Freq: Three times a day (TID) | ORAL | Status: DC
Start: 2013-10-12 — End: 2013-10-17
  Administered 2013-10-12 – 2013-10-17 (×15): 1 g via ORAL
  Filled 2013-10-12 (×19): qty 1

## 2013-10-12 MED ORDER — HYDROMORPHONE HCL 1 MG/ML IJ SOLN
0.5000 mg | INTRAMUSCULAR | Status: DC | PRN
  Administered 2013-10-12 (×2): 0.5 mg via INTRAVENOUS
  Filled 2013-10-12 (×2): qty 1

## 2013-10-12 NOTE — Progress Notes (Signed)
CRITICAL VALUE ALERT  Critical value received:  Na 121  Date of notification:  10/12/13  Time of notification:  1000  Critical value read back:Yes.    Nurse who received alert:  Lajuana Matteina Damonte Frieson, RN  MD notified (1st page):  Dr. Waymon AmatoHongalgi  Time of first page:  [phone]  Responding MD:  Waymon AmatoHongalgi, MD  Time MD responded:  [phone]

## 2013-10-12 NOTE — Procedures (Signed)
Informed consent/time out performed  Sitting position/ prep drape in sterile fashion  1% lidocaine for topical anesthesia  # 18 g thoracentesis needle placed easily  by percussion R base >  1 .5 liters orange slt turbid fluid obtained  Pt tol well   Sandrea HughsMichael Syaire Saber, MD Pulmonary and Critical Care Medicine [facility] Cell [phone] After 5:30 PM or weekends, call [phone]

## 2013-10-12 NOTE — Progress Notes (Signed)
Pt given IV 40mg  Lasix and condom cath applied yesterday 10/10 at 10pm.  Pt has had not urine output since.  Bladder scan indicates 239mL in bladder at this time. Pt has no complaints of discomfort and RN will continue to monitor. Thane EduKimberly Hecker, RN

## 2013-10-12 NOTE — Progress Notes (Signed)
PROGRESS NOTE    Xavier Valentine ZOX:[id number] DOB: 08-30-1960 DOA: 10/11/2013 PCP: Doris CheadleADVANI, DEEPAK, MD  HPI/Brief narrative 53 year old male with history of hep C/EtOH cirrhosis, coagulopathy, thrombocytopenia, right-sided transudative pleural effusions s/p thoracentesis x1 on 09/27/13, discharged from hospital on 10/10/13, are admitted on 10/11/13 secondary to worsening left-sided chest wall pain and noted to have large right-sided pleural effusion. During previous admission, pulmonology had recommended treating his pleural effusion with Lasix and holding of thoracentesis. Pulmonology was consulted again and patient underwent therapeutic right thoracentesis on 10/11.  Assessment/Plan:  1. 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 Pulmonology consulted and initially placed him on IV Lasix and oral Aldactone, without significant improvement. He underwent >1.5 L right thoracentesis on 10/11 with significant symptomatic improvement. As per pulmonology, consider TIPSS procedure. 2. Left sided chest pain/ 7 & 8 rib fractures: This was made worse by dyspnea from problem #1. Pain management and monitor. 3. 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 Patient already on Lasix. Will add sodium chloride tabs. Fluid restriction. Follow BMP closely. 4. Hep C/EtOH cirrhosis/coagulopathy/thrombocytopenia/? Encephalopathy: INR 1.9. Ammonia normal. MELD score 18. As per discussion with family, unable to clearly tell when he last drank alcohol but was drinking at least a month ago. Continue her Rifaximin and lactulose. Patient apparently has followup appointment with local hepatology clinic and GI. Will also need outpatient followup with ID for hep C treatment. 5. History of alcohol abuse: Ativan protocol. 6. Ascites: recent ultrasound apparently had not enough ascites to tap. Denies abdominal pain. No features suggestive of SBP. Monitor.   Code Status: DO NOT  RESUSCITATE Family Communication: Discussed with patient's sister Ms. Evern CoreVicki Iddings Disposition Plan: Home when medically stable.   Consultants:  Pulmonology  Procedures:  Right therapeutic thoracentesis 10/11  Antibiotics:  Rifaximin   Subjective: Patient seen this morning prior to thoracentesis-was complaining of significant left sided musculoskeletal type chest pain and some dyspnea.  Objective: Filed Vitals:   10/12/13 0432 10/12/13 0454 10/12/13 0515 10/12/13 0638  BP: 125/80  124/68 120/70  Pulse: 80  80 80  Temp: 97.5 F (36.4 C)  97.5 F (36.4 C) 97.3 F (36.3 C)  TempSrc: Oral  Oral Oral  Resp: 18  18 18   Height:      Weight:   83.19 kg (183 lb 6.4 oz)   SpO2: 91% 92% 94% 90%    Intake/Output Summary (Last 24 hours) at 10/12/13 1555 Last data filed at 10/12/13 [id number]  Gross per 24 hour  Intake    716 ml  Output    500 ml  Net    216 ml   Filed Weights   10/11/13 1806 10/12/13 0515  Weight: 83.3 kg (183 lb 10.3 oz) 83.19 kg (183 lb 6.4 oz)     Exam:  General exam: Younger, chronically ill-looking male was laying propped up in bed with mild respiratory distress and painful distress Respiratory system: Reduced breath sounds right lung fields. Reproducible lower left sided chest wall tenderness. Mild increased work of breathing. Cardiovascular system: S1 & S2 heard, RRR. No JVD, murmurs, gallops, clicks or pedal edema. Telemetry: Sinus rhythm. Gastrointestinal system: Abdomen is mildly distended, soft and nontender. Normal bowel sounds heard. Central nervous system: Alert and oriented. No focal neurological deficits. Extremities: Symmetric 5 x 5 power.   Data Reviewed: Basic Metabolic Panel:  Recent Labs Lab 10/07/13 1714 10/08/13 0704 10/09/13 1000 10/10/13 0432 10/11/13 1300 10/12/13 0845  NA  --  127* 125* 126* 123* 121*  K  --  3.9 3.9 4.2 4.3 4.5  CL  --  93* 91* 89* 89* 86*  CO2  --  25 25 24 24 26   GLUCOSE  --  111* 140* 111* 123*  125*  BUN  --  25* 25* 26* 32* 36*  CREATININE  --  0.77 0.81 0.84 0.88 0.89  CALCIUM  --  8.6 8.0* 8.0* 8.1* 8.8  MG 1.8  --  1.7  --   --   --    Liver Function Tests:  Recent Labs Lab 10/06/13 0220 10/08/13 0704 10/09/13 1000 10/11/13 1300 10/12/13 0845  AST 63* 50* 54* 40* 42*  ALT 44 40 33 31 31  ALKPHOS 99 88 87 99 96  BILITOT 3.8* 6.0* 2.8* 3.2* 4.1*  PROT 7.7 7.6 7.2 7.0 7.0  ALBUMIN 2.2* 2.2* 2.0* 1.8* 2.1*   No results found for this basename: LIPASE, AMYLASE,  in the last 168 hours  Recent Labs Lab 10/11/13 2020  AMMONIA 24   CBC:  Recent Labs Lab 10/06/13 0220 10/07/13 1029 10/08/13 0704 10/09/13 1000 10/11/13 1300 10/12/13 0845  WBC 8.4 21.1* 15.8* 14.4* 14.2* 13.8*  NEUTROABS 5.2  --   --   --   --   --   HGB 12.7* 13.1 12.6* 12.2* 12.7* 12.1*  HCT 36.6* 37.0* 36.4* 33.8* 35.9* 34.1*  MCV 91.5 89.8 91.7 89.2 89.5 90.2  PLT 91* 84* 77* 105* 132* 141*   Cardiac Enzymes: No results found for this basename: CKTOTAL, CKMB, CKMBINDEX, TROPONINI,  in the last 168 hours BNP (last 3 results)  Recent Labs  09/02/13 0520 09/09/13 0430 10/07/13 1029  PROBNP 348.5* 383.9* 241.7*   CBG: No results found for this basename: GLUCAP,  in the last 168 hours  Recent Results (from the past 240 hour(s))  URINE CULTURE     Status: None   Collection Time    10/07/13  3:02 PM      Result Value Ref Range Status   Specimen Description URINE, RANDOM   Final   Special Requests NONE   Final   Culture  Setup Time     Final   Value: 10/07/2013 20:54     Performed at [facility]   Colony Count     Final   Value: NO GROWTH     [facility]formed [facility]   Culture     Final   Value: NO GROWTH     Performed at [facility]   Report Status 10/08/2013 FINAL   Final  CULTURE, BLOOD (ROUTINE X 2)     Status: None   Collection Time    10/07/13  5:08 PM      Result Value Ref Range Status   Specimen Description BLOOD RIGHT ARM   Final    Special Requests BOTTLES DRAWN AEROBIC AND ANAEROBIC 10CC   Final   Culture  Setup Time     Final   Value: 10/07/2013 23:30     Performed at [facility]   Culture     Final   Value:        BLOOD CULTURE RECEIVED NO GROWTH TO DATE CULTURE WILL BE HELD FOR 5 DAYS BEFORE ISSUING A FINAL NEGATIVE REPORT     Performed at [facility]   Report Status PENDING   Incomplete  MRSA PCR SCREENING     Status: Abnormal   Collection Time    10/07/13  5:09 PM      Result Value Ref  Range Status   MRSA by PCR POSITIVE (*) NEGATIVE Final   Comment:            The GeneXpert MRSA Assay (FDA     approved for NASAL specimens     only), is one component of a     comprehensive MRSA colonization     surveillance program. It is not     intended to diagnose MRSA     infection nor to guide or     monitor treatment for     MRSA infections.     RESULT CALLED TO, READ BACK BY AND VERIFIED WITH:     A.BRICKHOUSE,RN 2307 10/07/13 M.CAMPBELL  CULTURE, BLOOD (ROUTINE X 2)     Status: None   Collection Time    10/07/13  5:14 PM      Result Value Ref Range Status   Specimen Description BLOOD LEFT ARM   Final   Special Requests     Final   Value: BOTTLES DRAWN AEROBIC AND ANAEROBIC AER 6CC,4CC ANA   Culture  Setup Time     Final   Value: 10/07/2013 23:27     Performed at [facility]   Culture     Final   Value:        BLOOD CULTURE RECEIVED NO GROWTH TO DATE CULTURE WILL BE HELD FOR 5 DAYS BEFORE ISSUING A FINAL NEGATIVE REPORT     Performed at [facility]   Report Status PENDING   Incomplete          Studies: Dg Chest 2 View  10/12/2013   CLINICAL DATA:  Persistent dyspnea  EXAM: CHEST  2 VIEW  COMPARISON:  October 11, 2013  FINDINGS: There remains complete opacification of the right hemithorax, presumably due to effusion. If fusion appears to displace the right main bronchus inferiorly.  The left lung is clear except for some cyst minimal left base atelectatic change.  The heart size is normal. Pulmonary vascularity on the left is normal. There is no demonstrable pneumothorax.  IMPRESSION: Persistent complete opacification of the right hemi thorax, presumably due to effusion. There may well be superimposed consolidation on the right, obscured by the effusion. Left lung is clear except for mild left base atelectatic change.   Electronically Signed   By: Bretta Bang M.D.   On: 10/12/2013 14:59   Dg Ribs Unilateral W/chest Left  10/11/2013   CLINICAL DATA:  Shortness breath. Cough. Ascites. Hepatic insufficiency. Left rib and chest pain.  EXAM: LEFT RIBS AND CHEST - 3+ VIEW  COMPARISON:  10/07/2013  FINDINGS: Nondisplaced acute fractures are seen involving the left posterior lateral seventh and eighth ribs. There is no evidence of pneumothorax.  Large right pleural effusion is further increase in size now resulting complete opacification of the right hemithorax. Left lung remains grossly clear. Heart size is stable.  IMPRESSION: Nondisplaced fractures of the left posterior seventh and eighth ribs. No evidence of pneumothorax.  Increased size of large right pleural effusion causing complete opacification of right hemithorax.   Electronically Signed   By: Myles Rosenthal M.D.   On: 10/11/2013 14:27        Scheduled Meds: . sodium chloride   Intravenous Once  . clonazePAM  0.5 mg Oral QHS  . fentaNYL  25 mcg Transdermal Q72H  . folic acid  1 mg Oral Daily  . furosemide  40 mg Intravenous BID  . lactulose  30 g Oral TID  . rifaximin  550 mg  Oral Q12H  . sodium chloride  3 mL Intravenous Q12H  . spironolactone  100 mg Oral BID  . tamsulosin  0.4 mg Oral Daily  . thiamine  100 mg Oral Daily   Continuous Infusions:   Principal Problem:   Recurrent right pleural effusion Active Problems:   Hyponatremia   possible encephalopathy, hepatic   Ascites due to alcoholic cirrhosis   BPH (benign prostatic hyperplasia)   Liver cirrhosis   Alcohol abuse, in  remission   Hepatitis C   Acute respiratory failure with hypoxia    Time spent: 40 mins    Kalen Neidert, MD, FACP, FHM. [facility] Pager [phone]  If 7PM-7AM, please contact night-coverage [email or web address] Password [email or web address] 10/12/2013, 3:55 PM    LOS: 1 day

## 2013-10-13 ENCOUNTER — Inpatient Hospital Stay (HOSPITAL_COMMUNITY): Payer: Medicaid - Out of State

## 2013-10-13 DIAGNOSIS — J90 Pleural effusion, not elsewhere classified: Secondary | ICD-10-CM

## 2013-10-13 DIAGNOSIS — J9601 Acute respiratory failure with hypoxia: Secondary | ICD-10-CM

## 2013-10-13 DIAGNOSIS — K7031 Alcoholic cirrhosis of liver with ascites: Principal | ICD-10-CM

## 2013-10-13 LAB — PATHOLOGIST SMEAR REVIEW

## 2013-10-13 LAB — COMPREHENSIVE METABOLIC PANEL
ALT: 33 U/L (ref 0–53)
ANION GAP: 10 (ref 5–15)
AST: 44 U/L — ABNORMAL HIGH (ref 0–37)
Albumin: 2.1 g/dL — ABNORMAL LOW (ref 3.5–5.2)
Alkaline Phosphatase: 99 U/L (ref 39–117)
BILIRUBIN TOTAL: 4.3 mg/dL — AB (ref 0.3–1.2)
BUN: 33 mg/dL — AB (ref 6–23)
CALCIUM: 8.6 mg/dL (ref 8.4–10.5)
CHLORIDE: 89 meq/L — AB (ref 96–112)
CO2: 26 mEq/L (ref 19–32)
CREATININE: 0.92 mg/dL (ref 0.50–1.35)
GFR calc Af Amer: >90 mL/min (ref >90)
GFR calc non Af Amer: >90 mL/min (ref >90)
Glucose, Bld: 86 mg/dL (ref 70–99)
Potassium: 5 mEq/L (ref 3.7–5.3)
Sodium: 125 mEq/L — ABNORMAL LOW (ref 137–147)
TOTAL PROTEIN: 7.1 g/dL (ref 6.0–8.3)

## 2013-10-13 LAB — PREPARE FRESH FROZEN PLASMA
Unit division: 0
Unit division: 0
Unit division: 0

## 2013-10-13 LAB — CULTURE, BLOOD (ROUTINE X 2)
Culture: NO GROWTH
Culture: NO GROWTH

## 2013-10-13 MED ORDER — LIDOCAINE HCL (PF) 1 % IJ SOLN
INTRAMUSCULAR | Status: AC
  Filled 2013-10-13: qty 10

## 2013-10-13 MED ORDER — CHLORHEXIDINE GLUCONATE CLOTH 2 % EX PADS
6.0000 | MEDICATED_PAD | Freq: Every day | CUTANEOUS | Status: AC
  Administered 2013-10-13 – 2013-10-17 (×5): 6 via TOPICAL

## 2013-10-13 MED ORDER — MUPIROCIN 2 % EX OINT
1.0000 "application " | TOPICAL_OINTMENT | Freq: Two times a day (BID) | CUTANEOUS | Status: AC
  Administered 2013-10-13 – 2013-10-17 (×9): 1 via NASAL
  Filled 2013-10-13 (×2): qty 22

## 2013-10-13 NOTE — Progress Notes (Signed)
Responded to page to assist patient is completing advance directives. Directives completed, notarized and copy given to staff for patients chart. Original and two copies given to patient.  Patient is alert and experiencing some anxiety. I  listened empathetically  to patient and explored with him his  fears and hope. Patient sister at bedside.  Will follow as needed.

## 2013-10-13 NOTE — Progress Notes (Signed)
PROGRESS NOTE    Xavier Valentine ZOX:[id number] DOB: 1960-09-03 DOA: 10/11/2013 PCP: Doris CheadleADVANI, DEEPAK, MD  HPI/Brief narrative 53 year old male with history of hep C/EtOH cirrhosis, coagulopathy, thrombocytopenia, right-sided transudative pleural effusions s/p thoracentesis x1 on 09/27/13, discharged from hospital on 10/10/13, are admitted on 10/11/13 secondary to worsening left-sided chest wall pain and noted to have large right-sided pleural effusion. During previous admission, pulmonology had recommended treating his pleural effusion with Lasix and holding of thoracentesis. Pulmonology was consulted again and patient underwent therapeutic right thoracentesis on 10/11.  Assessment/Plan:  1. 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 Pulmonology consulted and initially placed him on IV Lasix and oral Aldactone, without significant improvement. He underwent >1.5 L right thoracentesis on 10/11 with significant symptomatic improvement. Pleural fluid with some exudative features. As per pulmonary, patient to undergo multiple thoracentesis until dry, advised no chest tube due to difficulty removing (hepatic hydrothorax) and consider antibiotics if declines. Pleural fluid culture negative to date. 2. Left sided chest pain/ 7 & 8 rib fractures: This was made worse by dyspnea from problem #1. Pain management and monitor. Improved. 3. 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 Patient already on Lasix. Will add sodium chloride tabs. Fluid restriction. Follow BMP closely. Sodium improved to 125. 4. Hep C/EtOH cirrhosis/coagulopathy/thrombocytopenia/? Encephalopathy: INR 1.9. Ammonia normal. MELD score 18. As per discussion with family, unable to clearly tell when he last drank alcohol but was drinking at least a month ago. Continue her Rifaximin and lactulose. Patient apparently has followup appointment with local hepatology clinic and GI. Will also need  outpatient followup with ID for hep C treatment. 5. History of alcohol abuse: Ativan protocol. 6. Ascites: recent ultrasound apparently had not enough ascites to tap. Denies abdominal pain. No features suggestive of SBP. Monitor. 7. Failure to thrive:? Palliative care consultation   Code Status: DO NOT RESUSCITATE Family Communication: Discussed with patient's sister Ms. Evern CoreVicki Iddings 10/11 Disposition Plan: Home when medically stable.   Consultants:  Pulmonology  Procedures:  Right therapeutic thoracentesis 10/11, 10/12  Antibiotics:  Rifaximin   Subjective: Patient seen this morning. Dyspnea improved. Left-sided chest pain controlled. Stated that he had 3 BMs this morning.  Objective: Filed Vitals:   10/13/13 1450 10/13/13 1452 10/13/13 1500 10/13/13 1700  BP: 107/49 97/51 92/49    Pulse:      Temp:    97.7 F (36.5 C)  TempSrc:    Oral  Resp:      Height:      Weight:      SpO2:    93%  pulse 91 per minute regular, respirations 16 per minute  Intake/Output Summary (Last 24 hours) at 10/13/13 1910 Last data filed at 10/13/13 1700  Gross per 24 hour  Intake    360 ml  Output   1300 ml  Net   -940 ml   Filed Weights   10/11/13 1806 10/12/13 0515 10/13/13 0500  Weight: 83.3 kg (183 lb 10.3 oz) 83.19 kg (183 lb 6.4 oz) 82.691 kg (182 lb 4.8 oz)     Exam:  General exam: Younger, chronically ill-looking male was laying propped up in bed with mild respiratory distress-but improved compared to yesterday and does not appear in pain Respiratory system: Reduced breath sounds right lung fields-slightly improved compared to yesterday. Reproducible lower left sided chest wall tenderness. Mild increased work of breathing. Cardiovascular system: S1 & S2 heard, RRR. No JVD, murmurs, gallops, clicks or pedal edema. Telemetry: Sinus rhythm. Gastrointestinal system: Abdomen is mildly distended,  soft and nontender. Normal bowel sounds heard. Central nervous system: Alert and  oriented. No focal neurological deficits. Extremities: Symmetric 5 x 5 power.   Data Reviewed: Basic Metabolic Panel:  Recent Labs Lab 10/07/13 1714  10/09/13 1000 10/10/13 0432 10/11/13 1300 10/12/13 0845 10/13/13 0548  NA  --   < > 125* 126* 123* 121* 125*  K  --   < > 3.9 4.2 4.3 4.5 5.0  CL  --   < > 91* 89* 89* 86* 89*  CO2  --   < > 25 24 24 26 26   GLUCOSE  --   < > 140* 111* 123* 125* 86  BUN  --   < > 25* 26* 32* 36* 33*  CREATININE  --   < > 0.81 0.84 0.88 0.89 0.92  CALCIUM  --   < > 8.0* 8.0* 8.1* 8.8 8.6  MG 1.8  --  1.7  --   --   --   --   < > = values in this interval not displayed. Liver Function Tests:  Recent Labs Lab 10/08/13 0704 10/09/13 1000 10/11/13 1300 10/12/13 0845 10/13/13 0548  AST 50* 54* 40* 42* 44*  ALT 40 33 31 31 33  ALKPHOS 88 87 99 96 99  BILITOT 6.0* 2.8* 3.2* 4.1* 4.3*  PROT 7.6 7.2 7.0 7.0 7.1  ALBUMIN 2.2* 2.0* 1.8* 2.1* 2.1*   No results found for this basename: LIPASE, AMYLASE,  in the last 168 hours  Recent Labs Lab 10/11/13 2020  AMMONIA 24   CBC:  Recent Labs Lab 10/07/13 1029 10/08/13 0704 10/09/13 1000 10/11/13 1300 10/12/13 0845  WBC 21.1* 15.8* 14.4* 14.2* 13.8*  HGB 13.1 12.6* 12.2* 12.7* 12.1*  HCT 37.0* 36.4* 33.8* 35.9* 34.1*  MCV 89.8 91.7 89.2 89.5 90.2  PLT 84* 77* 105* 132* 141*   Cardiac Enzymes: No results found for this basename: CKTOTAL, CKMB, CKMBINDEX, TROPONINI,  in the last 168 hours BNP (last 3 results)  Recent Labs  09/02/13 0520 09/09/13 0430 10/07/13 1029  PROBNP 348.5* 383.9* 241.7*   CBG: No results found for this basename: GLUCAP,  in the last 168 hours  Recent Results (from the past 240 hour(s))  URINE CULTURE     Status: None   Collection Time    10/07/13  3:02 PM      Result Value Ref Range Status   Specimen Description URINE, RANDOM   Final   Special Requests NONE   Final   Culture  Setup Time     Final   Value: 10/07/2013 20:54     Performed at [facility]   Colony Count     Final   Value: NO GROWTH     [facility]formed at [facility]   Culture     Final   Value: NO GROWTH     Performed at [facility]   Report Status 10/08/2013 FINAL   Final  CULTURE, BLOOD (ROUTINE X 2)     Status: None   Collection Time    10/07/13  5:08 PM      Result Value Ref Range Status   Specimen Description BLOOD RIGHT ARM   Final   Special Requests BOTTLES DRAWN AEROBIC AND ANAEROBIC 10CC   Final   Culture  Setup Time     Final   Value: 10/07/2013 23:30     Performed at [facility]   Culture     Final   Value: NO  GROWTH 5 DAYS     Performed at [facility]   Report Status 10/13/2013 FINAL   Final  MRSA PCR SCREENING     Status: Abnormal   Collection Time    10/07/13  5:09 PM      Result Value Ref Range Status   MRSA by PCR POSITIVE (*) NEGATIVE Final   Comment:            The GeneXpert MRSA Assay (FDA     approved for NASAL specimens     only), is one component of a     comprehensive MRSA colonization     surveillance program. It is not     intended to diagnose MRSA     infection nor to guide or     monitor treatment for     MRSA infections.     RESULT CALLED TO, READ BACK BY AND VERIFIED WITH:     A.BRICKHOUSE,RN 2307 10/07/13 M.CAMPBELL  CULTURE, BLOOD (ROUTINE X 2)     Status: None   Collection Time    10/07/13  5:14 PM      Result Value Ref Range Status   Specimen Description BLOOD LEFT ARM   Final   Special Requests     Final   Value: BOTTLES DRAWN AEROBIC AND ANAEROBIC AER 6CC,4CC ANA   Culture  Setup Time     Final   Value: 10/07/2013 23:27     Performed at [facility]   Culture     Final   Value: NO GROWTH 5 DAYS     Performed at [facility]   Report Status 10/13/2013 FINAL   Final  BODY FLUID CULTURE     Status: None   Collection Time    10/12/13 11:54 AM      Result Value Ref Range Status   Specimen Description PLEURAL FLUID   Final   Special Requests NONE   Final   Gram  Stain     Final   Value: FEW WBC PRESENT, PREDOMINANTLY PMN     NO ORGANISMS SEEN     Performed at [facility]   Culture     Final   Value: NO GROWTH 1 DAY     Performed at [facility]   Report Status PENDING   Incomplete          Studies: Dg Chest 1 View  10/13/2013   CLINICAL DATA:  Pleural effusion.  Status post RIGHT thoracentesis.  EXAM: CHEST - 1 VIEW  COMPARISON:  10/13/2013 at 11/1932 priors.  FINDINGS: There is considerable, near complete opacity at the RIGHT hemi thorax despite removal of what is reportedly a large amount of fluid. There is no visible pneumothorax. No osseous lesions.  There may be mild mediastinal shift from LEFT to RIGHT implying a component of this opacity relates to volume loss.  IMPRESSION: No visible pneumothorax following RIGHT thoracentesis. See discussion above.   Electronically Signed   By: Davonna Belling M.D.   On: 10/13/2013 15:30   Dg Chest 2 View  10/12/2013   CLINICAL DATA:  Persistent dyspnea  EXAM: CHEST  2 VIEW  COMPARISON:  October 11, 2013  FINDINGS: There remains complete opacification of the right hemithorax, presumably due to effusion. If fusion appears to displace the right main bronchus inferiorly.  The left lung is clear except for some cyst minimal left base atelectatic change. The heart size is normal. Pulmonary vascularity on the left is normal. There is  no demonstrable pneumothorax.  IMPRESSION: Persistent complete opacification of the right hemi thorax, presumably due to effusion. There may well be superimposed consolidation on the right, obscured by the effusion. Left lung is clear except for mild left base atelectatic change.   Electronically Signed   By: Bretta Bang M.D.   On: 10/12/2013 14:59   Dg Chest Portable 1 View  10/13/2013   CLINICAL DATA:  Pleural effusion.  EXAM: PORTABLE CHEST - 1 VIEW  COMPARISON:  10/12/2013 .  FINDINGS: Opacification the right hemithorax again noted. This is consistent with  large pleural effusion. Underlying pulmonary consolidation and/or atelectasis may be present. Left lung is clear. Heart size normal. No acute bony abnormality.  IMPRESSION: Unchanged complete opacification of the right hemithorax, most consistent with large right pleural effusion. Underlying pulmonary consolidation and or atelectasis may be present. Left lung is clear .   Electronically Signed   By: Maisie Fus  Register   On: 10/13/2013 12:19   US Thoracentesis Asp Pleural Space W/img Guide  10/13/2013   CLINICAL DATA:  Right pleural effusion  EXAM: ULTRASOUND GUIDED right THORACENTESIS  COMPARISON:  Previous thoracentesis  PROCEDURE: An ultrasound guided thoracentesis was thoroughly discussed with the patient and questions answered. The benefits, risks, alternatives and complications were also discussed. The patient understands and wishes to proceed with the procedure. Written consent was obtained.  Ultrasound was performed to localize and mark an adequate pocket of fluid in the right chest. The area was then prepped and draped in the normal sterile fashion. 1% Lidocaine was used for local anesthesia. Under ultrasound guidance a 19 gauge Yueh catheter was introduced. Thoracentesis was performed. The catheter was removed and a dressing applied.  Complications:  None  FINDINGS: A total of approximately 1.5 L of orange reddish fluid was removed. A fluid sample was notsent for laboratory analysis.  IMPRESSION: Successful ultrasound guided right thoracentesis yielding 1.5 L of pleural fluid.  Read by:  Robet Leu   Electronically Signed   By: Malachy Moan M.D.   On: 10/13/2013 15:59        Scheduled Meds: . Chlorhexidine Gluconate Cloth  6 each Topical Q0600  . clonazePAM  0.5 mg Oral QHS  . fentaNYL  25 mcg Transdermal Q72H  . folic acid  1 mg Oral Daily  . furosemide  40 mg Intravenous BID  . lactulose  30 g Oral TID  . mupirocin ointment  1 application Nasal BID  . rifaximin  550 mg Oral Q12H   . sodium chloride  3 mL Intravenous Q12H  . sodium chloride  1 g Oral TID WC  . spironolactone  100 mg Oral BID  . tamsulosin  0.4 mg Oral Daily  . thiamine  100 mg Oral Daily   Continuous Infusions:   Principal Problem:   Recurrent right pleural effusion Active Problems:   Hyponatremia   possible encephalopathy, hepatic   Ascites due to alcoholic cirrhosis   BPH (benign prostatic hyperplasia)   Liver cirrhosis   Alcohol abuse, in remission   Hepatitis C   Acute respiratory failure with hypoxia    Time spent: 40 mins    Sherrice Creekmore, MD, FACP, FHM. [facility] Pager [phone]  If 7PM-7AM, please contact night-coverage [email or web address] Password [email or web address] 10/13/2013, 7:10 PM    LOS: 2 days

## 2013-10-13 NOTE — Progress Notes (Addendum)
Nutrition Brief Note  Patient identified on the Malnutrition Screening Tool (MST) Report for recent weight lost without trying and eating poorly because of a decreased appetite.  Per wt readings, patient's weight has had some fluctuations (likely due to fluid), however, mostly stable.  Wt Readings from Last 15 Encounters:  10/13/13 182 lb 4.8 oz (82.691 kg)  10/10/13 189 lb 13.1 oz (86.1 kg)  10/01/13 167 lb 15.9 oz (76.2 kg)  09/26/13 177 lb 3.2 oz (80.377 kg)  09/21/13 186 lb 9.6 oz (84.641 kg)  09/12/13 184 lb (83.462 kg)  09/05/13 196 lb 6.9 oz (89.1 kg)    Body mass index is 25.44 kg/(m^2). Patient meets criteria for Overweight based on current BMI.   Current diet order is Heart, patient's average meal consumption is approximately 75% at this time. Labs and medications reviewed.   No nutrition interventions warranted at this time. If nutrition issues arise, please consult RD.   Maureen ChattersKatie Oaklee Esther, RD, LDN Pager #: [phone] After-Hours Pager #: [phone]

## 2013-10-13 NOTE — Procedures (Signed)
   US guided Rt thora 1.5 liters; orange reddish fluid  cxr pending Pt tolerated well

## 2013-10-13 NOTE — Evaluation (Signed)
Physical Therapy Evaluation Patient Details Name: Xavier Valentine MRN: [id number] DOB: 1960-04-14 Today's Date: 10/13/2013   History of Present Illness  Right-sided chest pain or shortness of breath and cough. 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 Thoracentesis on 9/26 and 10/11  Clinical Impression  Pt pleasant with decreased safety awareness, balance and cardiopulmonary status. Pt 93% on 2L at rest with drop to 85% on RA with gait with standing recovery 3min on 3L to maintain sats 93% on 3L with gait. Pt educated for pursed lip breathing, awareness of energy conservation and desaturation. Pt with significant balance deficits as well who will benefit from acute therapy to maximize safety, function, balance and gait to decrease burden of care and fall risk.      Follow Up Recommendations Home health PT;Supervision/Assistance - 24 hour    Equipment Recommendations  None recommended by PT    Recommendations for Other Services       Precautions / Restrictions Precautions Precautions: Fall Precaution Comments: watch sats      Mobility  Bed Mobility Overal bed mobility: Modified Independent                Transfers Overall transfer level: Modified independent   Transfers: Sit to/from Stand              Ambulation/Gait Ambulation/Gait assistance: Supervision Ambulation Distance (Feet): 350 Feet Assistive device: None Gait Pattern/deviations: Step-through pattern;Decreased stride length;Wide base of support;Decreased dorsiflexion - left;Decreased  dorsiflexion - right   Gait velocity interpretation: Below normal speed for age/gender General Gait Details: cues for pursed lip breathing, safety and to stop talking to maintain sats  Stairs            Wheelchair Mobility    Modified Rankin (Stroke Patients Only)       Balance     Sitting balance-Leahy Scale: Good       Standing balance-Leahy Scale: Fair Standing balance comment: Pt unable to stand on one leg >1 sec without LOB, sharpened Romberg 10 sec eyes closed with increased sway before LOB, supervision for turning 360degrees                             Pertinent Vitals/Pain Pain Score: 4  Pain Location: rib cage bil  Pain Descriptors / Indicators: Aching Pain Intervention(s): Repositioned    Home Living Family/patient expects to be discharged to:: Private residence Living Arrangements: Other relatives Available Help at Discharge: Family;Available PRN/intermittently Type of Home: Other(Comment) (pt lives in RV on sister's property) Home Access: Stairs to enter Entrance Stairs-Rails: Right;Left;Can reach both Entrance Stairs-Number of Steps: 4 Home Layout: One level Home Equipment: None Additional Comments: Sister present to confirm PLOF. Pt can live in the house without steps if needed.     Prior Function Level of Independence: Independent               Hand Dominance        Extremity/Trunk Assessment   Upper Extremity Assessment: Generalized weakness           Lower Extremity Assessment: Generalized weakness  Cervical / Trunk Assessment: Normal  Communication   Communication: No difficulties  Cognition Arousal/Alertness: Awake/alert Behavior During Therapy: Restless Overall Cognitive Status: Impaired/Different from baseline Area of Impairment: Safety/judgement         Safety/Judgement: Decreased awareness of deficits     General Comments: pt very talkative, difficult to maintain focus and topic. pt was able  to count backward from 20 and state how to call 911    General Comments      Exercises        Assessment/Plan    PT Assessment Patient needs continued PT services  PT Diagnosis Altered mental status;Abnormality of gait   PT Problem List Decreased mobility;Decreased cognition;Decreased balance;Cardiopulmonary status limiting activity  PT Treatment Interventions Gait training;Functional mobility training;Therapeutic activities;Patient/family education;Balance training   PT Goals (Current goals can be found in the Care Plan section) Acute Rehab PT Goals Patient Stated Goal: wants to be on the road in RV PT Goal Formulation: With patient Time For Goal Achievement: 10/27/13 Potential to Achieve Goals: Fair    Frequency Min 3X/week   Barriers to discharge Decreased caregiver support      Co-evaluation               End of Session Equipment Utilized During Treatment: Oxygen Activity Tolerance: Patient tolerated treatment well Patient left: in chair;with call bell/phone within reach;Other (comment) (pt preparing to transfer for procedure) Nurse Communication: Mobility status         Time: [id number] PT Time Calculation (min): 23 min   Charges:   PT Evaluation $Initial PT Evaluation Tier I: 1 Procedure PT Treatments $Gait Training: 8-22 mins   PT G Codes:          Delorse Lekabor, Angelicia Lessner Beth 10/13/2013, 12:34 PM Delaney MeigsMaija Tabor Graylin Sperling, PT [phone]

## 2013-10-13 NOTE — Progress Notes (Signed)
PULMONARY / CRITICAL CARE MEDICINE   Name: Xavier DuskyRichard L Traynor MRN: [id number] DOB: [birth year]    ADMISSION DATE:  10/11/2013 CONSULTATION DATE:  10/11/2013  REFERRING MD :  Thedore MinsSingh  CHIEF COMPLAINT:  SOB  INITIAL PRESENTATION:   53yo male with pmh of hepC/ETOH cirrhosis, thrombocytopenia, hx of right sided transudative pleural effusions with recent thoracentesis (9/26 with 1.4L off), just d/c 10/9 (Decision at that time made to d/c on high dose of lasix and not repeat thora).  Returned 10/10 with left sided chest/rib cage pain. CXR in the ED demonstrated a reaccumulated right sided pleural effusion in addition to hyponatremia (both chronic in nature)  STUDIES:  10/10  DG ribs Increased size of large right pleural effusion causing complete  opacification of right hemithorax/ multiple L rib fx   SIGNIFICANT EVENTS: 9/20 paracentesis with 3.6 liters yellow tinged fluid removed.  9/26 thoracentesis  X 1.4 liters- transudate, hepatic hydrothorax  10/5 to ED with abd distention, improved with BM and discharged  10/6 to ED with SOB and chest pain, admitted with effusion.   10/10 readmitted for acute dyspnea and reaccumulation of fluid 10/10 Thoracentesis 1.5L orange, slightly turbid fluid   SUBJECTIVE:  SOB slightly improved.  Still with L pleuritic chest pain.   VITAL SIGNS: Temp:  [97.8 F (36.6 C)-98.3 F (36.8 C)] 98.3 F (36.8 C) (10/12 0439) Pulse Rate:  [85-91] 91 (10/12 0439) Resp:  [16-18] 16 (10/12 0439) BP: (113-139)/(60-85) 113/60 mmHg (10/12 0439) SpO2:  [92 %-100 %] 100 % (10/12 0439) Weight:  [182 lb 4.8 oz (82.691 kg)] 182 lb 4.8 oz (82.691 kg) (10/12 0500) HEMODYNAMICS:   VENTILATOR SETTINGS:   INTAKE / OUTPUT:  Intake/Output Summary (Last 24 hours) at 10/13/13 1103 Last data filed at 10/13/13 0900  Gross per 24 hour  Intake    480 ml  Output    850 ml  Net   -370 ml    PHYSICAL EXAMINATION: General:  Ill appearing; sickly  Neuro: awake alert, tangential  but oriented X3 HEENT:  PERRL (pinpoint); scleral icterus  Cardiovascular:  RRR, s1/2  Lungs:  Decreased breath sounds on right Abdomen:  Full, distended, hypoactive BS Musculoskeletal:  Pain on palpation of rib cage; full ROM  Skin: jaundice   LABS:  CBC  Recent Labs Lab 10/09/13 1000 10/11/13 1300 10/12/13 0845  WBC 14.4* 14.2* 13.8*  HGB 12.2* 12.7* 12.1*  HCT 33.8* 35.9* 34.1*  PLT 105* 132* 141*   Coag's  Recent Labs Lab 10/07/13 1714  INR 1.95*   BMET  Recent Labs Lab 10/11/13 1300 10/12/13 0845 10/13/13 0548  NA 123* 121* 125*  K 4.3 4.5 5.0  CL 89* 86* 89*  CO2 24 26 26   BUN 32* 36* 33*  CREATININE 0.88 0.89 0.92  GLUCOSE 123* 125* 86   Electrolytes  Recent Labs Lab 10/07/13 1714  10/09/13 1000  10/11/13 1300 10/12/13 0845 10/13/13 0548  CALCIUM  --   < > 8.0*  < > 8.1* 8.8 8.6  MG 1.8  --  1.7  --   --   --   --   < > = values in this interval not displayed. Sepsis Markers No results found for this basename: LATICACIDVEN, PROCALCITON, O2SATVEN,  in the last 168 hours ABG  Recent Labs Lab 10/12/13 0530  PHART 7.413  PCO2ART 38.1  PO2ART 64.7*   Liver Enzymes  Recent Labs Lab 10/11/13 1300 10/12/13 0845 10/13/13 0548  AST 40* 42* 44*  ALT 31 31 33  ALKPHOS 99 96 99  BILITOT 3.2* 4.1* 4.3*  ALBUMIN 1.8* 2.1* 2.1*   Cardiac Enzymes  Recent Labs Lab 10/07/13 1029  PROBNP 241.7*   Glucose No results found for this basename: GLUCAP,  in the last 168 hours  Imaging Dg Chest 2 View  10/12/2013   CLINICAL DATA:  Persistent dyspnea  EXAM: CHEST  2 VIEW  COMPARISON:  October 11, 2013  FINDINGS: There remains complete opacification of the right hemithorax, presumably due to effusion. If fusion appears to displace the right main bronchus inferiorly.  The left lung is clear except for some cyst minimal left base atelectatic change. The heart size is normal. Pulmonary vascularity on the left is normal. There is no demonstrable  pneumothorax.  IMPRESSION: Persistent complete opacification of the right hemi thorax, presumably due to effusion. There may well be superimposed consolidation on the right, obscured by the effusion. Left lung is clear except for mild left base atelectatic change.   Electronically Signed   By: Bretta BangWilliam  Woodruff M.D.   On: 10/12/2013 14:59     ASSESSMENT / PLAN:    Recurrent R pleural effusion -- Previously transudative on multiple recent taps, but 10/11 meets Light's criteria for exudate by LDH as well as increased WBC (>2000 10/11, previously 400-700).  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 At this point, best plan is to completely drain by thoracentesis (will need to be done slowly given large volume) and then assess recurrent fluid. Suspect the reaccumulation will be transudate.     Dirk DressKaty Whiteheart, NP 10/13/2013  11:03 AM Pager: [phone] or [phone]  *Care during the described time interval was provided by me and/or other providers on the critical care team. I have reviewed this patient's available data, including medical history, events of note, physical examination and test results as part of my evaluation.  Levy Pupaobert Alisse Tuite, MD, PhD 10/13/2013, 12:32 PM [facility] [phone] or if no answer [phone]

## 2013-10-14 LAB — COMPREHENSIVE METABOLIC PANEL
ALBUMIN: 1.9 g/dL — AB (ref 3.5–5.2)
ALK PHOS: 112 U/L (ref 39–117)
ALT: 31 U/L (ref 0–53)
AST: 45 U/L — ABNORMAL HIGH (ref 0–37)
Anion gap: 10 (ref 5–15)
BUN: 30 mg/dL — ABNORMAL HIGH (ref 6–23)
CO2: 26 mEq/L (ref 19–32)
Calcium: 8.1 mg/dL — ABNORMAL LOW (ref 8.4–10.5)
Chloride: 89 mEq/L — ABNORMAL LOW (ref 96–112)
Creatinine, Ser: 0.92 mg/dL (ref 0.50–1.35)
GFR calc Af Amer: >90 mL/min (ref >90)
GFR calc non Af Amer: >90 mL/min (ref >90)
Glucose, Bld: 67 mg/dL — ABNORMAL LOW (ref 70–99)
POTASSIUM: 4.7 meq/L (ref 3.7–5.3)
SODIUM: 125 meq/L — AB (ref 137–147)
TOTAL PROTEIN: 7.2 g/dL (ref 6.0–8.3)
Total Bilirubin: 2.9 mg/dL — ABNORMAL HIGH (ref 0.3–1.2)

## 2013-10-14 LAB — GLUCOSE, CAPILLARY
GLUCOSE-CAPILLARY: 118 mg/dL — AB (ref 70–99)
Glucose-Capillary: 104 mg/dL — ABNORMAL HIGH (ref 70–99)

## 2013-10-14 LAB — CBC
HCT: 35.9 % — ABNORMAL LOW (ref 39.0–52.0)
HEMOGLOBIN: 12.5 g/dL — AB (ref 13.0–17.0)
MCH: 32 pg (ref 26.0–34.0)
MCHC: 34.8 g/dL (ref 30.0–36.0)
MCV: 91.8 fL (ref 78.0–100.0)
Platelets: 142 10*3/uL — ABNORMAL LOW (ref 150–400)
RBC: 3.91 MIL/uL — ABNORMAL LOW (ref 4.22–5.81)
RDW: 15.2 % (ref 11.5–15.5)
WBC: 11.1 10*3/uL — ABNORMAL HIGH (ref 4.0–10.5)

## 2013-10-14 NOTE — Progress Notes (Signed)
PULMONARY / CRITICAL CARE MEDICINE   Name: Xavier DuskyRichard L Bastin MRN: [id number] DOB: Sep 02, 1960    ADMISSION DATE:  10/11/2013 CONSULTATION DATE:  10/11/2013  REFERRING MD :  Thedore MinsSingh  CHIEF COMPLAINT:  SOB  INITIAL PRESENTATION:   53 y/o male with PMH of hepC/ETOH cirrhosis, thrombocytopenia, hx of right sided transudative pleural effusions with recent thoracentesis (9/26 with 1.4L off), just d/c 10/9 (Decision at that time made to d/c on high dose of lasix and not repeat thora).  Returned 10/10 with left sided chest/rib cage pain. CXR in the ED demonstrated a reaccumulated right sided pleural effusion in addition to hyponatremia (both chronic in nature)  STUDIES:  10/10  DG ribs > Increased size of large right pleural effusion causing complete opacification of right hemithorax/ multiple L rib fx   SIGNIFICANT EVENTS: 9/20  paracentesis with 3.6 liters yellow tinged fluid removed.  9/26  thoracentesis  X 1.4 liters- transudate, hepatic hydrothorax  10/5  to ED with abd distention, improved with BM and discharged  10/6  to ED with SOB and chest pain, admitted with effusion.   10/10 readmitted for acute dyspnea and reaccumulation of fluid 10/10 Thoracentesis 1.5L orange, slightly turbid fluid  10/12  Thora by IR, 1.5 L fluid removed SUBJECTIVE:  SOB improved, continues to c/o rib pain, anxiety.   VITAL SIGNS: Temp:  [97.6 F (36.4 C)-98.1 F (36.7 C)] 97.6 F (36.4 C) (10/13 0422) Pulse Rate:  [79-88] 79 (10/13 0422) Resp:  [18] 18 (10/13 0422) BP: (92-124)/(49-74) 117/58 mmHg (10/13 0422) SpO2:  [93 %-96 %] 96 % (10/13 0422) Weight:  [178 lb 12.8 oz (81.103 kg)] 178 lb 12.8 oz (81.103 kg) (10/13 0422)  INTAKE / OUTPUT:  Intake/Output Summary (Last 24 hours) at 10/14/13 1017 Last data filed at 10/14/13 0700  Gross per 24 hour  Intake    730 ml  Output   1400 ml  Net   -670 ml    PHYSICAL EXAMINATION: General:  Chronically ill appearing Neuro: awake alert, tangential but  oriented X3 HEENT:  PERRL (pinpoint); scleral icterus  Cardiovascular:  RRR, s1/2  Lungs:  Decreased breath sounds on right Abdomen:  Full, distended, hypoactive BS Musculoskeletal:  Pain on palpation of rib cage; full ROM  Skin: jaundice   LABS:  CBC  Recent Labs Lab 10/11/13 1300 10/12/13 0845 10/14/13 0551  WBC 14.2* 13.8* 11.1*  HGB 12.7* 12.1* 12.5*  HCT 35.9* 34.1* 35.9*  PLT 132* 141* 142*   Coag's  Recent Labs Lab 10/07/13 1714  INR 1.95*   BMET  Recent Labs Lab 10/12/13 0845 10/13/13 0548 10/14/13 0551  NA 121* 125* 125*  K 4.5 5.0 4.7  CL 86* 89* 89*  CO2 26 26 26   BUN 36* 33* 30*  CREATININE 0.89 0.92 0.92  GLUCOSE 125* 86 67*   Electrolytes  Recent Labs Lab 10/07/13 1714  10/09/13 1000  10/12/13 0845 10/13/13 0548 10/14/13 0551  CALCIUM  --   < > 8.0*  < > 8.8 8.6 8.1*  MG 1.8  --  1.7  --   --   --   --   < > = values in this interval not displayed.  Liver Enzymes  Recent Labs Lab 10/12/13 0845 10/13/13 0548 10/14/13 0551  AST 42* 44* 45*  ALT 31 33 31  ALKPHOS 96 99 112  BILITOT 4.1* 4.3* 2.9*  ALBUMIN 2.1* 2.1* 1.9*   Imaging Dg Chest 1 View  10/13/2013   CLINICAL DATA:  Pleural effusion.  Status post RIGHT thoracentesis.  EXAM: CHEST - 1 VIEW  COMPARISON:  10/13/2013 at 11/1932 priors.  FINDINGS: There is considerable, near complete opacity at the RIGHT hemi thorax despite removal of what is reportedly a large amount of fluid. There is no visible pneumothorax. No osseous lesions.  There may be mild mediastinal shift from LEFT to RIGHT implying a component of this opacity relates to volume loss.  IMPRESSION: No visible pneumothorax following RIGHT thoracentesis. See discussion above.   Electronically Signed   By: Davonna BellingJohn  Curnes M.D.   On: 10/13/2013 15:30   Dg Chest Portable 1 View  10/13/2013   CLINICAL DATA:  Pleural effusion.  EXAM: PORTABLE CHEST - 1 VIEW  COMPARISON:  10/12/2013 .  FINDINGS: Opacification the right  hemithorax again noted. This is consistent with large pleural effusion. Underlying pulmonary consolidation and/or atelectasis may be present. Left lung is clear. Heart size normal. No acute bony abnormality.  IMPRESSION: Unchanged complete opacification of the right hemithorax, most consistent with large right pleural effusion. Underlying pulmonary consolidation and or atelectasis may be present. Left lung is clear .   Electronically Signed   By: Maisie Fushomas  Register   On: 10/13/2013 12:19   [place] Thoracentesis Asp Pleural Space W/img Guide  10/13/2013   CLINICAL DATA:  Right pleural effusion  EXAM: ULTRASOUND GUIDED right THORACENTESIS  COMPARISON:  Previous thoracentesis  PROCEDURE: An ultrasound guided thoracentesis was thoroughly discussed with the patient and questions answered. The benefits, risks, alternatives and complications were also discussed. The patient understands and wishes to proceed with the procedure. Written consent was obtained.  Ultrasound was performed to localize and mark an adequate pocket of fluid in the right chest. The area was then prepped and draped in the normal sterile fashion. 1% Lidocaine was used for local anesthesia. Under ultrasound guidance a 19 gauge Yueh catheter was introduced. Thoracentesis was performed. The catheter was removed and a dressing applied.  Complications:  None  FINDINGS: A total of approximately 1.5 L of orange reddish fluid was removed. A fluid sample was notsent for laboratory analysis.  IMPRESSION: Successful ultrasound guided right thoracentesis yielding 1.5 L of pleural fluid.  Read by:  Robet LeuPamela A Turpin   Electronically Signed   By: Malachy MoanHeath  McCullough M.D.   On: 10/13/2013 15:59     ASSESSMENT / PLAN:    Recurrent R pleural effusion -  Previously transudative on multiple recent taps, but 10/11 meets Light's criteria for exudate by LDH as well as increased WBC (>2000 10/11, previously 400-700).  ??seeded infection with multiple recent instrumentation  pleural space vs pseudoexudate.   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  He clinically does not look like he has an empyema -Strict i/o -O2 to maintain sats > 92% -Consider TIPS at some point given recurrent nature of effusion -Nebs prn -F/u CXR now  -Would avoid chest tube in setting hepatic hydrothorax (would be extremely difficult to remove), but may need to consider if high suspicion for infection or if deteriorates clinically.    -Repeat large volume thora 10/14;  -Consider CT assessment if no change in CXR post thora 10/14 -If able to drain dry, reassess pleural fluid if re-accumulated.   -f/u cxr in am  -recommend palliative care consult   Canary BrimBrandi Ollis, NP-C [facility] Pgr: [phone] or [phone]  Levy Pupaobert Kaikoa Magro, MD, PhD 10/14/2013, 12:09 PM [facility]  [phone] or if no answer [phone]

## 2013-10-14 NOTE — Progress Notes (Signed)
PT Cancellation Note  Patient Details Name: Xavier Valentine MRN: [id number] DOB: August 29, 1960   Cancelled Treatment:    Reason Eval/Treat Not Completed: Patient at procedure or test/unavailable   Toney Sangabor, Ramzi Brathwaite Beth 10/14/2013, 11:26 AM Delaney MeigsMaija Tabor Yassmine Tamm, PT [phone]

## 2013-10-14 NOTE — ED Provider Notes (Addendum)
Medical screening examination/treatment/procedure(s) were conducted as a shared visit with non-physician practitioner(s) and myself.  I personally evaluated the patient during the encounter.   EKG Interpretation   Date/Time:  Tuesday October 07 2013 10:13:33 EDT Ventricular Rate:  98 PR Interval:  170 QRS Duration: 90 QT Interval:  392 QTC Calculation: 500 R Axis:   -20 Text Interpretation:  Normal sinus rhythm Septal infarct , age  undetermined Prolonged QT Abnormal ECG ED PHYSICIAN INTERPRETATION  AVAILABLE IN [facility] Confirmed by TEST, Record ([id number]) on  10/09/2013 7:39:44 AM       Arby BarretteMarcy Tehya Leath, MD 10/14/13 0126 The patient presents with increasing chest pain and shortness of breath. He has multiple medical comorbidities. On examination he is not in acute distress although has stigmata of chronic cirrhosis and illness. The patient's chest x-ray does show significantly increasing right pleural effusion. Leukocytosis is identified. At this point time the patient will be admitted for further treatment.  Arby BarretteMarcy Aloysuis Ribaudo, MD 11/22/13 [phone]

## 2013-10-14 NOTE — Progress Notes (Signed)
Patient WG:NFAOZHY:[name]      DOB: 19-Nov-1960      QMV:[id number]  Palliative Medicine consult received . Will see asap tomorrow.    Joselynne Killam L. Ladona Ridgelaylor, MD MBA [facility] at [facility] Team Phone: [phone] Pager: [phone] ( Use team phone after hours)

## 2013-10-14 NOTE — Progress Notes (Signed)
PROGRESS NOTE    Xavier DuskyRichard L Valentine YNW:[id number] DOB: 06-01-1960 DOA: 10/11/2013 PCP: Doris CheadleADVANI, DEEPAK, MD  HPI/Brief narrative [age] year old male with history of hep C/EtOH cirrhosis, coagulopathy, thrombocytopenia, right-sided transudative pleural effusions s/p thoracentesis x1 on 09/27/13, discharged from hospital on 10/10/13, are admitted on 10/11/13 secondary to worsening left-sided chest wall pain and noted to have large right-sided pleural effusion. During previous admission, pulmonology had recommended treating his pleural effusion with Lasix and holding of thoracentesis. Pulmonology was consulted again patient undergoing repeated right thoracentesis by IR. Palliative care consulted for goals of care on 10/13.  Assessment/Plan:  1. 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 Pulmonology consulted and initially placed him on IV Lasix and oral Aldactone, without significant improvement. He underwent >1.5 L right thoracentesis on 10/11 with significant symptomatic improvement. Pleural fluid with some exudative features. Chest x-ray with significant residual pleural effusion. As per pulmonary, patient to undergo multiple thoracentesis until dry, advised no chest tube due to difficulty removing (hepatic hydrothorax) and consider antibiotics if declines (fever, leukocytosis). Pleural fluid culture negative to date. Next 10/14 and a chest x-ray does not show improvement then consider CT chest. Management per pulmonology. 2. Left sided chest pain/ 7 & 8 rib fractures: This was made worse by dyspnea from problem #1. Pain management and monitor. Improved. 3. 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 Patient already on Lasix. Added sodium chloride tabs. Fluid restriction. Follow BMP closely. Sodium improved to 125. 4. Hep C/EtOH cirrhosis/coagulopathy/thrombocytopenia/? Encephalopathy: INR 1.9. Ammonia normal. MELD score 18. As per patient, has not had any  alcohol for 2.5 months. Continue Rifaximin and lactulose. Patient apparently has followup appointment with local hepatology clinic and GI. Will also need outpatient followup with ID for hep C treatment. 5. History of alcohol abuse: Ativan protocol. No overt withdrawal. 6. Ascites: recent ultrasound apparently had not enough ascites to tap. Denies abdominal pain. No features suggestive of SBP. Monitor. 7. Failure to thrive: Discussed extensively with patient and patient's sister/health care power of attorney [name]-agreeable to palliative care consult for goals of care.    Code Status: DO NOT RESUSCITATE Family Communication: Discussed with patient's sister/health care power of attorney [name] on 10/13 Disposition Plan: Home when medically stable.   Consultants:  Pulmonology  Palliative care team-pending  Procedures:  Right therapeutic thoracentesis 10/11, 10/12  Antibiotics:  Rifaximin   Subjective: Dyspnea improved. Intermittent left-sided chest wall pain.  Objective: Filed Vitals:   10/13/13 1700 10/13/13 2003 10/14/13 0422 10/14/13 1518  BP:  124/74 117/58 127/76  Pulse:  88 79 86  Temp: 97.7 F (36.5 C) 98.1 F (36.7 C) 97.6 F (36.4 C) 98.1 F (36.7 C)  TempSrc: Oral Axillary Oral Oral  Resp:  18 18 18   Height:      Weight:   81.103 kg (178 lb 12.8 oz)   SpO2: 93% 93% 96% 93%    Intake/Output Summary (Last 24 hours) at 10/14/13 1527 Last data filed at 10/14/13 1300  Gross per 24 hour  Intake    730 ml  Output   1150 ml  Net   -420 ml   Filed Weights   10/12/13 0515 10/13/13 0500 10/14/13 0422  Weight: 83.19 kg (183 lb 6.4 oz) 82.691 kg (182 lb 4.8 oz) 81.103 kg (178 lb 12.8 oz)     Exam:  General exam: Younger, chronically ill-looking male lying comfortably propped up in bed.  Respiratory system: Reduced breath sounds right lung fields-much improved compared to 2 days  ago. Reproducible lower left sided chest wall tenderness. No  increased work of breathing. Cardiovascular system: S1 & S2 heard, RRR. No JVD, murmurs, gallops, clicks or pedal edema. Telemetry: Sinus rhythm. Gastrointestinal system: Abdomen is mildly distended, soft and nontender. Normal bowel sounds heard. Central nervous system: Alert and oriented. No focal neurological deficits. Extremities: Symmetric 5 x 5 power.   Data Reviewed: Basic Metabolic Panel:  Recent Labs Lab 10/07/13 1714  10/09/13 1000 10/10/13 0432 10/11/13 1300 10/12/13 0845 10/13/13 0548 10/14/13 0551  NA  --   < > 125* 126* 123* 121* 125* 125*  K  --   < > 3.9 4.2 4.3 4.5 5.0 4.7  CL  --   < > 91* 89* 89* 86* 89* 89*  CO2  --   < > 25 24 24 26 26 26   GLUCOSE  --   < > 140* 111* 123* 125* 86 67*  BUN  --   < > 25* 26* 32* 36* 33* 30*  CREATININE  --   < > 0.81 0.84 0.88 0.89 0.92 0.92  CALCIUM  --   < > 8.0* 8.0* 8.1* 8.8 8.6 8.1*  MG 1.8  --  1.7  --   --   --   --   --   < > = values in this interval not displayed. Liver Function Tests:  Recent Labs Lab 10/09/13 1000 10/11/13 1300 10/12/13 0845 10/13/13 0548 10/14/13 0551  AST 54* 40* 42* 44* 45*  ALT 33 31 31 33 31  ALKPHOS 87 99 96 99 112  BILITOT 2.8* 3.2* 4.1* 4.3* 2.9*  PROT 7.2 7.0 7.0 7.1 7.2  ALBUMIN 2.0* 1.8* 2.1* 2.1* 1.9*   No results found for this basename: LIPASE, AMYLASE,  in the last 168 hours  Recent Labs Lab 10/11/13 2020  AMMONIA 24   CBC:  Recent Labs Lab 10/08/13 0704 10/09/13 1000 10/11/13 1300 10/12/13 0845 10/14/13 0551  WBC 15.8* 14.4* 14.2* 13.8* 11.1*  HGB 12.6* 12.2* 12.7* 12.1* 12.5*  HCT 36.4* 33.8* 35.9* 34.1* 35.9*  MCV 91.7 89.2 89.5 90.2 91.8  PLT 77* 105* 132* 141* 142*   Cardiac Enzymes: No results found for this basename: CKTOTAL, CKMB, CKMBINDEX, TROPONINI,  in the last 168 hours BNP (last 3 results)  Recent Labs  09/02/13 0520 09/09/13 0430 10/07/13 1029  PROBNP 348.5* 383.9* 241.7*   CBG: No results found for this basename: GLUCAP,  in the  last 168 hours  Recent Results (from the past 240 hour(s))  URINE CULTURE     Status: None   Collection Time    10/07/13  3:02 PM      Result Value Ref Range Status   Specimen Description URINE, RANDOM   Final   Special Requests NONE   Final   Culture  Setup Time     Final   Value: 10/07/2013 20:54     Performed at [facility]   Colony Count     Final   Value: NO GROWTH     Performed at [facility]   Culture     Final   Value: NO GROWTH     Performed at [facility]   Report Status 10/08/2013 FINAL   Final  CULTURE, BLOOD (ROUTINE X 2)     Status: None   Collection Time    10/07/13  5:08 PM      Result Value Ref Range Status   Specimen Description BLOOD RIGHT ARM   Final  Special Requests BOTTLES DRAWN AEROBIC AND ANAEROBIC 10CC   Final   Culture  Setup Time     Final   Value: 10/07/2013 23:30     Performed at [facility]   Culture     Final   Value: NO GROWTH 5 DAYS     Performed at [facility]   Report Status 10/13/2013 FINAL   Final  MRSA PCR SCREENING     Status: Abnormal   Collection Time    10/07/13  5:09 PM      Result Value Ref Range Status   MRSA by PCR POSITIVE (*) NEGATIVE Final   Comment:            The GeneXpert MRSA Assay (FDA     approved for NASAL specimens     only), is one component of a     comprehensive MRSA colonization     surveillance program. It is not     intended to diagnose MRSA     infection nor to guide or     monitor treatment for     MRSA infections.     RESULT CALLED TO, READ BACK BY AND VERIFIED WITH:     A.BRICKHOUSE,RN 2307 10/07/13 M.CAMPBELL  CULTURE, BLOOD (ROUTINE X 2)     Status: None   Collection Time    10/07/13  5:14 PM      Result Value Ref Range Status   Specimen Description BLOOD LEFT ARM   Final   Special Requests     Final   Value: BOTTLES DRAWN AEROBIC AND ANAEROBIC AER 6CC,4CC ANA   Culture  Setup Time     Final   Value: 10/07/2013 23:27     Performed at [facility]   Culture     Final   Value: NO GROWTH 5 DAYS     Performed at [facility]   Report Status 10/13/2013 FINAL   Final  BODY FLUID CULTURE     Status: None   Collection Time    10/12/13 11:54 AM      Result Value Ref Range Status   Specimen Description PLEURAL FLUID   Final   Special Requests NONE   Final   Gram Stain     Final   Value: FEW WBC PRESENT, PREDOMINANTLY PMN     NO ORGANISMS SEEN     Performed at [facility]   Culture     Final   Value: NO GROWTH 2 DAYS     Performed at [facility]   Report Status PENDING   Incomplete          Studies: Dg Chest 1 View  10/13/2013   CLINICAL DATA:  Pleural effusion.  Status post RIGHT thoracentesis.  EXAM: CHEST - 1 VIEW  COMPARISON:  10/13/2013 at 11/1932 priors.  FINDINGS: There is considerable, near complete opacity at the RIGHT hemi thorax despite removal of what is reportedly a large amount of fluid. There is no visible pneumothorax. No osseous lesions.  There may be mild mediastinal shift from LEFT to RIGHT implying a component of this opacity relates to volume loss.  IMPRESSION: No visible pneumothorax following RIGHT thoracentesis. See discussion above.   Electronically Signed   By: Davonna Belling M.D.   On: 10/13/2013 15:30   Dg Chest Portable 1 View  10/13/2013   CLINICAL DATA:  Pleural effusion.  EXAM: PORTABLE CHEST - 1 VIEW  COMPARISON:  10/12/2013 .  FINDINGS: Opacification the  right hemithorax again noted. This is consistent with large pleural effusion. Underlying pulmonary consolidation and/or atelectasis may be present. Left lung is clear. Heart size normal. No acute bony abnormality.  IMPRESSION: Unchanged complete opacification of the right hemithorax, most consistent with large right pleural effusion. Underlying pulmonary consolidation and or atelectasis may be present. Left lung is clear .   Electronically Signed   By: Maisie Fushomas  Register   On: 10/13/2013 12:19   [place] Thoracentesis Asp  Pleural Space W/img Guide  10/13/2013   CLINICAL DATA:  Right pleural effusion  EXAM: ULTRASOUND GUIDED right THORACENTESIS  COMPARISON:  Previous thoracentesis  PROCEDURE: An ultrasound guided thoracentesis was thoroughly discussed with the patient and questions answered. The benefits, risks, alternatives and complications were also discussed. The patient understands and wishes to proceed with the procedure. Written consent was obtained.  Ultrasound was performed to localize and mark an adequate pocket of fluid in the right chest. The area was then prepped and draped in the normal sterile fashion. 1% Lidocaine was used for local anesthesia. Under ultrasound guidance a 19 gauge Yueh catheter was introduced. Thoracentesis was performed. The catheter was removed and a dressing applied.  Complications:  None  FINDINGS: A total of approximately 1.5 L of orange reddish fluid was removed. A fluid sample was notsent for laboratory analysis.  IMPRESSION: Successful ultrasound guided right thoracentesis yielding 1.5 L of pleural fluid.  Read by:  Robet LeuPamela A Turpin   Electronically Signed   By: Malachy MoanHeath  McCullough M.D.   On: 10/13/2013 15:59        Scheduled Meds: . Chlorhexidine Gluconate Cloth  6 each Topical Q0600  . clonazePAM  0.5 mg Oral QHS  . fentaNYL  25 mcg Transdermal Q72H  . folic acid  1 mg Oral Daily  . furosemide  40 mg Intravenous BID  . lactulose  30 g Oral TID  . mupirocin ointment  1 application Nasal BID  . rifaximin  550 mg Oral Q12H  . sodium chloride  3 mL Intravenous Q12H  . sodium chloride  1 g Oral TID WC  . spironolactone  100 mg Oral BID  . tamsulosin  0.4 mg Oral Daily  . thiamine  100 mg Oral Daily   Continuous Infusions:   Principal Problem:   Recurrent right pleural effusion Active Problems:   Hyponatremia   possible encephalopathy, hepatic   Ascites due to alcoholic cirrhosis   BPH (benign prostatic hyperplasia)   Liver cirrhosis   Alcohol abuse, in remission    Hepatitis C   Acute respiratory failure with hypoxia    Time spent: 20 mins    HONGALGI,ANAND, MD, FACP, FHM. [facility] Pager [phone]  If 7PM-7AM, please contact night-coverage [email or web address] Password [email or web address] 10/14/2013, 3:26 PM    LOS: 3 days

## 2013-10-15 ENCOUNTER — Inpatient Hospital Stay (HOSPITAL_COMMUNITY): Payer: Medicaid - Out of State

## 2013-10-15 LAB — BASIC METABOLIC PANEL
ANION GAP: 8 (ref 5–15)
BUN: 30 mg/dL — ABNORMAL HIGH (ref 6–23)
CHLORIDE: 87 meq/L — AB (ref 96–112)
CO2: 26 meq/L (ref 19–32)
CREATININE: 0.97 mg/dL (ref 0.50–1.35)
Calcium: 8.1 mg/dL — ABNORMAL LOW (ref 8.4–10.5)
GFR calc Af Amer: >90 mL/min (ref >90)
GFR calc non Af Amer: >90 mL/min (ref >90)
Glucose, Bld: 87 mg/dL (ref 70–99)
Potassium: 5.2 mEq/L (ref 3.7–5.3)
SODIUM: 121 meq/L — AB (ref 137–147)

## 2013-10-15 LAB — GLUCOSE, CAPILLARY
GLUCOSE-CAPILLARY: 70 mg/dL (ref 70–99)
GLUCOSE-CAPILLARY: 86 mg/dL (ref 70–99)
Glucose-Capillary: 94 mg/dL (ref 70–99)
Glucose-Capillary: 104 mg/dL — ABNORMAL HIGH (ref 70–99)

## 2013-10-15 MED ORDER — LIDOCAINE HCL (PF) 1 % IJ SOLN
INTRAMUSCULAR | Status: AC
  Filled 2013-10-15: qty 10

## 2013-10-15 NOTE — Progress Notes (Signed)
Patient ZO:XWRUEAV:Xavier Valentine      DOB: 26-Apr-1960      WUJ:[id number]  Patient down this am for Thoracocentesis. Spoke via phone with POA sister Theresia LoLisa Panell.  Updated on role and service of Palliative Medicine team ( not hospice team).  Answered some general questions related to TIPS and Pleurx.  Will need GI input as stated by Dr. Delton CoombesByrum.  Sister would like to meet at 830 am Thursday 10/15.   GOC 10/15 at 830 am   Marquet Faircloth L. Ladona Ridgelaylor, MD MBA The Palliative Medicine Team at [facility] Team Phone: [phone] Pager: [phone] ( Use team phone after hours)

## 2013-10-15 NOTE — Progress Notes (Signed)
Physical Therapy Treatment Patient Details Name: Xavier DuskyRichard L Swaim MRN: [id number] DOB: Jun 29, 1960 Today's Date: 10/15/2013    History of Present Illness Right-sided chest pain or shortness of breath and cough. with a past medical history of liver cirrhosis secondary to alcohol as well as hepatitis C, recent admissions for ascites and effusion requiring paracentesis, [facility] with a wound infection and sepsis, recent healthcare associated pneumonia, thrombocytopenia due to his liver cirrhosis, coagulopathy due to his liver disease, who was discharged from the hospital on September 22 Readmitted 9/26 with D/C 10/9, readmit 10/10. Thoracentesis on 9/26 and 10/11    PT Comments    Pt continues to be at S level for gait with max verbal cues for pursed lip breathing and energy conservation techniques.  Note that during last PT session, they had gone over energy conservation techniques, however pt unable to recall any of them.  Discussed doing short bursts of activity at home with rest breaks in between.  Performing single task at a time and limiting amount of conversation during activity.  Also discussed that pt needed to have supplemental O2 on when performing all mobility and even with sitting/at rest due to amount of conversation.  Pt reluctant and states "I'll be okay, I'm just going to the bathroom."  Pt continues to need max education but feel that he will likely be un-receptive to most education.    Follow Up Recommendations  Home health PT;Supervision/Assistance - 24 hour     Equipment Recommendations  None recommended by PT    Recommendations for Other Services       Precautions / Restrictions Precautions Precautions: Fall Precaution Comments: watch sats Restrictions Weight Bearing Restrictions: No    Mobility  Bed Mobility Overal bed mobility:  (Pt sitting in arm chair when PT arrived)                Transfers Overall transfer  level: Modified independent Equipment used: None Transfers: Sit to/from Stand Sit to Stand: Modified independent (Device/Increase time)            Ambulation/Gait Ambulation/Gait assistance: Supervision Ambulation Distance (Feet): 300 Feet Assistive device: None Gait Pattern/deviations: Step-through pattern;Drifts right/left;Narrow base of support     General Gait Details: Pt requires MAX verbal cues for pursed lip breathing, energy conservation and decrease amount of talking during gait.  Pt overall steady,  but with narrow BOS and some staggering but no overt LOB.    Stairs            Wheelchair Mobility    Modified Rankin (Stroke Patients Only)       Balance                                    Cognition Arousal/Alertness: Awake/alert Behavior During Therapy: Restless Overall Cognitive Status: No family/caregiver present to determine baseline cognitive functioning Area of Impairment: Safety/judgement     Memory: Decreased recall of precautions   Safety/Judgement: Decreased awareness of deficits     General Comments: Pt continues to be very talkative and at times refuses to wear O2 stating "I'm only going to the restroom"    Exercises      General Comments        Pertinent Vitals/Pain Pain Assessment: 0-10 Pain Score: 0-No pain    Home Living  Prior Function            PT Goals (current goals can now be found in the care plan section) Acute Rehab PT Goals Patient Stated Goal: wants to be on the road in RV PT Goal Formulation: With patient Time For Goal Achievement: 10/27/13 Potential to Achieve Goals: Fair Progress towards PT goals: Progressing toward goals    Frequency  Min 3X/week    PT Plan Current plan remains appropriate    Co-evaluation             End of Session Equipment Utilized During Treatment: Oxygen Activity Tolerance: Patient limited by fatigue Patient left: in chair  (leaving unit for CT Scan)     Time: [id number] PT Time Calculation (min): 33 min  Charges:  $Gait Training: 8-22 mins $Self Care/Home Management: 8-22                    G Codes:      Vista Deckarcell, Latoyna Hird Ann 10/15/2013, 10:30 AM

## 2013-10-15 NOTE — Procedures (Signed)
  US guided R thora  2 liters Orange/red fluid  Pt tolerated well cxr pending

## 2013-10-15 NOTE — Progress Notes (Signed)
PULMONARY / CRITICAL CARE MEDICINE   Name: Xavier Valentine MRN: [id number] DOB: 1960-07-31    ADMISSION DATE:  10/11/2013 CONSULTATION DATE:  10/11/2013  REFERRING MD :  Thedore MinsSingh  CHIEF COMPLAINT:  SOB  INITIAL PRESENTATION:   53 y/o male with PMH of hepC/ETOH cirrhosis, thrombocytopenia, hx of right sided transudative pleural effusions with recent thoracentesis (9/26 with 1.4L off), just d/c 10/9 (Decision at that time made to d/c on high dose of lasix and not repeat thora).  Returned 10/10 with left sided chest/rib cage pain. CXR in the ED demonstrated a reaccumulated right sided pleural effusion in addition to hyponatremia (both chronic in nature)  STUDIES:  10/10  DG ribs > Increased size of large right pleural effusion causing complete opacification of right hemithorax/ multiple L rib fx   SIGNIFICANT EVENTS: 9/20  paracentesis with 3.6 liters yellow tinged fluid removed.  9/26  thoracentesis  X 1.4 liters- transudate, hepatic hydrothorax  10/5  to ED with abd distention, improved with BM and discharged  10/6  to ED with SOB and chest pain, admitted with effusion.   10/10 readmitted for acute dyspnea and reaccumulation of fluid 10/10 Thoracentesis 1.5L orange, slightly turbid fluid  10/12  Thora by IR, 1.5 L orange fluid removed  SUBJECTIVE:  Awaiting repeat thoracentesis.   VITAL SIGNS: Temp:  [98.1 F (36.7 C)-98.4 F (36.9 C)] 98.2 F (36.8 C) (10/14 0450) Pulse Rate:  [77-86] 77 (10/14 0450) Resp:  [18] 18 (10/14 0450) BP: (109-127)/(61-76) 111/61 mmHg (10/14 0450) SpO2:  [93 %-98 %] 95 % (10/14 0450) Weight:  [182 lb 8 oz (82.781 kg)] 182 lb 8 oz (82.781 kg) (10/14 0450)  INTAKE / OUTPUT:  Intake/Output Summary (Last 24 hours) at 10/15/13 0902 Last data filed at 10/15/13 0451  Gross per 24 hour  Intake    120 ml  Output   1600 ml  Net  -1480 ml    PHYSICAL EXAMINATION: General:  Chronically ill appearing  Neuro: awake alert, tangential but oriented  X3 HEENT:  PERRL (pinpoint); scleral icterus  Cardiovascular:  RRR, s1/2  Lungs:  Decreased breath sounds on right Abdomen:  Full, distended, hypoactive BS Musculoskeletal:  Pain on palpation of rib cage; full ROM  Skin: jaundice   LABS:  CBC  Recent Labs Lab 10/11/13 1300 10/12/13 0845 10/14/13 0551  WBC 14.2* 13.8* 11.1*  HGB 12.7* 12.1* 12.5*  HCT 35.9* 34.1* 35.9*  PLT 132* 141* 142*   Coag's No results found for this basename: APTT, INR,  in the last 168 hours BMET  Recent Labs Lab 10/13/13 0548 10/14/13 0551 10/15/13 0500  NA 125* 125* 121*  K 5.0 4.7 5.2  CL 89* 89* 87*  CO2 26 26 26   BUN 33* 30* 30*  CREATININE 0.92 0.92 0.97  GLUCOSE 86 67* 87   Electrolytes  Recent Labs Lab 10/09/13 1000  10/13/13 0548 10/14/13 0551 10/15/13 0500  CALCIUM 8.0*  < > 8.6 8.1* 8.1*  MG 1.7  --   --   --   --   < > = values in this interval not displayed.  Liver Enzymes  Recent Labs Lab 10/12/13 0845 10/13/13 0548 10/14/13 0551  AST 42* 44* 45*  ALT 31 33 31  ALKPHOS 96 99 112  BILITOT 4.1* 4.3* 2.9*  ALBUMIN 2.1* 2.1* 1.9*   Imaging No results found.   ASSESSMENT / PLAN:    Recurrent R pleural effusion -  Previously transudative on multiple recent taps, but 10/11 meets  Light's criteria for exudate by LDH as well as increased WBC (>2000 10/11, previously 400-700).  ??seeded infection with multiple recent instrumentation pleural space vs pseudoexudate.   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  He clinically does not look like he has an empyema -Strict i/o -O2 to maintain sats > 92% -Consider TIPS at some point given recurrent nature of effusion -Nebs prn -Would avoid chest tube in setting hepatic hydrothorax (would be extremely difficult to remove), but may need to consider if high suspicion for infection or if deteriorates clinically.    -Repeat large volume thora 10/14;   -Consider CT assessment if no change in CXR post thora 10/14 -If able to drain dry, reassess pleural fluid if re-accumulated.   -palliative care to see    Dirk DressKaty Whiteheart, NP 10/15/2013  9:02 AM Pager: [phone] or [phone]  *Care during the described time interval was provided by me and/or other providers on the critical care team. I have reviewed this patient's available data, including medical history, events of note, physical examination and test results as part of my evaluation.   Levy Pupaobert Byrum, MD, PhD 10/15/2013, 12:40 PM [facility] [phone] or if no answer [phone]

## 2013-10-15 NOTE — Progress Notes (Signed)
Pt request to speak with pulmonology-has some questions; notified the PA; PA will come and speak with pt.  Hermina BartersBOWMAN, Dezmen Alcock M, RN

## 2013-10-15 NOTE — ED Provider Notes (Signed)
CSN: [id number]     Arrival date & time 10/06/13  0105 History   First MD Initiated Contact with Patient 10/06/13 0147     Chief Complaint  Patient presents with  . Bloated     (Consider location/radiation/quality/duration/timing/severity/associated sxs/prior Treatment) HPI  53 y.o. male with a past medical history of liver cirrhosis, recent admission for significant ascites, requiring paracentesis, recent [facility] with a wound infection and sepsis, recent healthcare associated pneumonia, and coagulopathy presenting with abdominal bloating and 8lbs weight gain. Feels like his abdomen has become increasingly distended. He feels bloated. Crampy pain/discomfort. Doesn't particularly localize. No fevers or chills. No acute urinary complaints. Has been having bowel movements. Some mild shortness of breath. No fevers or chills. No change in cough. Reports compliance with his medications.   Past Medical History  Diagnosis Date  . Cirrhosis 08/2013    ETOH and Hep C  . Ascites 09/2013  . Thrombocytopenia 09/2013  . Coagulopathy 09/2013    due to hepatic dysfunction.   . Pneumonia 09/2013  . Chronic alcoholism   . Hepatitis C dx'd ~ 2000  . Anxiety   . Inguinal hernia     repaired in [place] 08/2013   Past Surgical History  Procedure Laterality Date  . Incision and drainage perirectal abscess  1980's  . Inguinal hernia repair Right 08/2013    at [facility] [place].    Family History  Problem Relation Age of Onset  . CAD    . Hypertension    . Diabetes Mother   . Hypertension Mother   . Cancer Father   . Cancer Maternal Grandfather    History  Substance Use Topics  . Smoking status: Former Smoker -- 10 years    Types: Cigarettes  . Smokeless tobacco: Never Used     Comment: "smoked 1/2 pack/wk when I did smoke"  . Alcohol Use: Yes     Comment: 10/07/2013 "I've had 2 drinks since 08/2013; I"ve quit"    Review of Systems  All systems reviewed  and negative, other than as noted in HPI.   Allergies  Bee venom  Home Medications   Prior to Admission medications   Medication Sig Start Date End Date Taking? Authorizing Provider  docusate sodium (COLACE) 100 MG capsule Take 1 capsule (100 mg total) by mouth 2 (two) times daily as needed for mild constipation. Hold if you have diarrhea. 10/01/13  Yes Tora KindredMarianne L York, PA-C  folic acid (FOLVITE) 1 MG tablet Take 1 tablet (1 mg total) by mouth daily. 09/09/13  Yes Clydia LlanoMutaz Elmahi, MD  lactulose (CHRONULAC) 10 GM/15ML solution Take 45 mLs (30 g total) by mouth daily. 10/01/13  Yes Marianne L York, PA-C  Multiple Vitamin (MULTIVITAMIN WITH MINERALS) TABS tablet Take 1 tablet by mouth daily. 09/09/13  Yes Clydia LlanoMutaz Elmahi, MD  tamsulosin (FLOMAX) 0.4 MG CAPS capsule Take 1 capsule (0.4 mg total) by mouth daily. 09/23/13  Yes Tora KindredMarianne L York, PA-C  thiamine 100 MG tablet Take 1 tablet (100 mg total) by mouth daily. 09/09/13  Yes Clydia LlanoMutaz Elmahi, MD  albuterol (PROVENTIL HFA;VENTOLIN HFA) 108 (90 BASE) MCG/ACT inhaler Inhale 2 puffs into the lungs daily as needed for wheezing or shortness of breath.    Historical Provider, MD  clonazePAM (KLONOPIN) 0.5 MG tablet Take 1 tablet (0.5 mg total) by mouth daily as needed for anxiety. 10/09/13   Leroy SeaPrashant K Singh, MD  furosemide (LASIX) 40 MG tablet Take 2 tablets (80 mg total) by  mouth 2 (two) times daily. 10/10/13   Leroy Sea, MD  oxyCODONE (ROXICODONE) 5 MG immediate release tablet Take 1 tablet (5 mg total) by mouth every 4 (four) hours as needed for severe pain. 10/09/13   Leroy Sea, MD  spironolactone (ALDACTONE) 100 MG tablet Take 1 tablet (100 mg total) by mouth 2 (two) times daily. 10/10/13   Leroy Sea, MD   BP 123/84  Pulse 82  Temp(Src) 97.9 F (36.6 C) (Oral)  Resp 20  SpO2 93% Physical Exam  Nursing note and vitals reviewed.  Constitutional: He appears well-developed and well-nourished.  Pt lying in exam bed, NAD. HENT:  Head:  Normocephalic and atraumatic.  Eyes: Conjunctivae are normal. No scleral icterus.  Neck: Normal range of motion.  Cardiovascular: Normal rate, regular rhythm and normal heart sounds.  Pulmonary/Chest: Effort normal. No respiratory distress. He has no wheezes. He has no rales. He exhibits no tenderness.  No respiratory distress, able to speak in full sentences, however, decreased breath sounds in R side.  Abdominal: Soft. Bowel sounds are normal. He exhibits no distension and no mass. There is no tenderness. There is no rebound and no guarding.  Sof, non-tender, no rebounding, masses or guarding.  Musculoskeletal: Normal range of motion.  Neurological: He is alert.  Skin: Skin is warm and dry.    ED Course  Procedures (including critical care time) Labs Review Labs Reviewed  COMPREHENSIVE METABOLIC PANEL - Abnormal; Notable for the following:    Sodium 129 (*)    Chloride 94 (*)    Calcium 8.2 (*)    Albumin 2.2 (*)    AST 63 (*)    Total Bilirubin 3.8 (*)    All other components within normal limits  CBC WITH DIFFERENTIAL - Abnormal; Notable for the following:    RBC 4.00 (*)    Hemoglobin 12.7 (*)    HCT 36.6 (*)    Platelets 91 (*)    All other components within normal limits  PROTIME-INR - Abnormal; Notable for the following:    Prothrombin Time 22.2 (*)    INR 1.95 (*)    All other components within normal limits  URINALYSIS, ROUTINE W REFLEX MICROSCOPIC - Abnormal; Notable for the following:    Color, Urine AMBER (*)    APPearance CLOUDY (*)    All other components within normal limits    Imaging Review    Dg Chest 2 View  10/06/2013   CLINICAL DATA:  8 lb of weight gain in the past 2 days. Known large right-sided pleural effusion. Abdominal bloating and constipation, of acute onset.  EXAM: CHEST  2 VIEW  COMPARISON:  Chest radiograph performed 09/29/2013  FINDINGS: A large right-sided pleural effusion is again noted, mildly increased from the prior study, with  associated atelectasis. The left lung appears clear. No pneumothorax is seen.  The heart is not well characterized due to the large right-sided pleural effusion. No acute osseous abnormalities are seen.  IMPRESSION: Large right-sided pleural effusion noted, mildly increased from the prior study, with associated atelectasis.   Electronically Signed   By: Roanna Raider M.D.   On: 10/06/2013 03:22     EKG Interpretation None      MDM   Final diagnoses:  Abdominal bloating    53 year old male with abdominal bloating/distention and abdominal discomfort. Abdomen is soft and nontender. He is no distress. He does have decreased breath sounds on the right, but has been steadily increased work of breathing. His  oxygen saturations are adequate on room air. He has numerous chronic underlying medical issues and is going to continually have problems with fluid overload/ascites/pleural effusions. He's not to the point that I feel he needs emergent admission or emergent paracentesis/thoracentesis. Discussed importance of compliance with his medications. Return precautions were discussed.  Raeford RazorStephen Sarahanne Novakowski, MD 10/15/13 1426

## 2013-10-15 NOTE — Care Management Note (Addendum)
    Page 1 of 2   10/28/2013     4:27:57 PM CARE MANAGEMENT NOTE 10/28/2013  Patient:  Xavier Valentine,Xavier Valentine   Account Number:  [id number]  Date Initiated:  10/15/2013  Documentation initiated by:  Amica Harron  Subjective/Objective Assessment:   Pt adm on 10/11/13 with recurrent pleural effusion.  PTA, pt lives alone.     Action/Plan:   Pt may be able to dc home with sister, per report. Palliative care to follow for goals of care.   Anticipated DC Date:  10/28/2013   Anticipated DC Plan:  HOME W HOME HEALTH SERVICES      DC Planning Services  CM consult  MATCH Program  Medication Assistance  Follow-up appt scheduled      [facility]  [facility]   Choice offered to / List presented to:  C-1 Patient   DME arranged  OXYGEN      DME agency  [facility]     [facility] arranged  HH[facility] RN      [facility]  [facility]   Status of service:  Completed, signed off Medicare Important Message given?   (If response is "NO", the following Medicare IM given date fields will be blank) Date Medicare IM given:   Medicare IM given by:   Date Additional Medicare IM given:   Additional Medicare IM given by:    Discharge Disposition:  HOME W HOME HEALTH SERVICES  Per UR Regulation:  Reviewed for med. necessity/level of care/duration of stay  If discussed at Long Length of Stay Meetings, dates discussed:   10/21/2013    Comments:  10/28/13 Xavier AceJulie Angeles Zehner, RN, BSN [phone] Pt for dc home today with sister.  Address [place] [address] [place], [place] [address]; phone # [phone].  Notified [facility] of dc date today.  Portable O2 tank delivered to pt room prior to dc.  Pt states he cannot afford his medications.  He is eligible for med assist with [facility]ATCH program.  [facility] given with explanation of program benefits.  Override provided for narcotics.  [facility].. follow up appt made with pt's PCP.  Appt info put in EPIC  on AVS.   10/27/13 1608 Xavier Mayo RN MSN BSN CCM [facility] will provide home health RN under their charity care program.  PT and OT would not be provided under [facility] so they will not provide home therapies. Discussed home O2 with pt, cost is $276/month.  Pt states his [employer] plan will pay for both home health services and home O2, he is preparing information so that [facility] can bill them.  Pt plans to stay with sister when discharged.  10/24/13 Xavier AceJulie Lizzy Hamre, RN, BSN [phone] Pt will need HH follow up at dc; will need orders.  10/22/13 Xavier AceJulie Towanda Hornstein, RN, BSN [phone] Pt for EGD today; [place] [facility].  Will cont to follow progress.

## 2013-10-15 NOTE — Progress Notes (Signed)
PROGRESS NOTE    Xavier Valentine:[id number] DOB: 10/06/1960 DOA: 10/11/2013 PCP: [name], MD  HPI/Brief narrative 53 year old male with history of hep C/EtOH cirrhosis, coagulopathy, thrombocytopenia, right-sided transudative pleural effusions s/p thoracentesis x1 on 09/27/13, discharged from hospital on 10/10/13, are admitted on 10/11/13 secondary to worsening left-sided chest wall pain and noted to have large right-sided pleural effusion. During previous admission, pulmonology had recommended treating his pleural effusion with Lasix and holding of thoracentesis. Pulmonology was consulted again patient undergoing repeated right thoracentesis by IR. Palliative care consulted for goals of care on 10/13.  Assessment/Plan:  1. 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 Pulmonology consulted and initially placed him on IV Lasix and oral Aldactone, without significant improvement. He underwent >1.5 L right thoracentesis on 10/11 with significant symptomatic improvement. Pleural fluid with some exudative features. Chest x-ray with significant residual pleural effusion. As per pulmonary, patient to undergo multiple thoracentesis until dry, advised no chest tube due to difficulty removing (hepatic hydrothorax) and consider antibiotics if declines (fever, leukocytosis). Pleural fluid culture negative to date. Underwent left thoracocentesis today. Plan for repeat CXRin am. 2. Left sided chest pain/ 7 & 8 rib fractures: This was made worse by dyspnea from problem #1. Pain management and monitor. Improved. 3. 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 Patient already on Lasix. Added sodium chloride tabs. Fluid restriction. Follow BMP closely. Sodium improved to 125. 4. Hep C/EtOH cirrhosis/coagulopathy/thrombocytopenia/? Encephalopathy: INR 1.9. Ammonia normal. MELD score 18. As per patient, has not had any alcohol for 2.5 months. Continue Rifaximin  and lactulose. Patient apparently has followup appointment with local hepatology clinic and GI. Will also need outpatient followup with ID for hep C treatment. 5. History of alcohol abuse: Ativan protocol. No overt withdrawal. 6. Ascites: recent ultrasound apparently had not enough ascites to tap. Denies abdominal pain. No features suggestive of SBP. Monitor. 7. Failure to thrive: Discussed extensively with patient and patient's sister/health care power of attorney [name]-agreeable to palliative care consult for goals of care.    Code Status: DO NOT RESUSCITATE Family Communication: Discussed with patient's sister/health care power of attorney Ms. Theresia Lo on 10/13 Disposition Plan: Home when medically stable.   Consultants:  Pulmonology  Palliative care team-pending  Procedures:  Right therapeutic thoracentesis 10/11, 10/12  Antibiotics:  Rifaximin   Subjective: Dyspnea improved. Intermittent left-sided chest wall pain.  Objective: Filed Vitals:   10/15/13 1038 10/15/13 1045 10/15/13 1055 10/15/13 1105  BP: 88/50 90/46 92/45  93/49  Pulse:      Temp:      TempSrc:      Resp:      Height:      Weight:      SpO2:        Intake/Output Summary (Last 24 hours) at 10/15/13 1248 Last data filed at 10/15/13 1100  Gross per 24 hour  Intake    120 ml  Output   2250 ml  Net  -2130 ml   Filed Weights   10/13/13 0500 10/14/13 0422 10/15/13 0450  Weight: 82.691 kg (182 lb 4.8 oz) 81.103 kg (178 lb 12.8 oz) 82.781 kg (182 lb 8 oz)     Exam:  General exam: Younger, chronically ill-looking male lying comfortably propped up in bed.  Respiratory system: Reduced breath sounds right lung fields-much improved compared to 2 days ago. Reproducible lower left sided chest wall tenderness. No increased work of breathing. Cardiovascular system: S1 & S2 heard, RRR. No JVD, murmurs, gallops, clicks or  pedal edema. Telemetry: Sinus rhythm. Gastrointestinal system: Abdomen is  mildly distended, soft and nontender. Normal bowel sounds heard. Central nervous system: Alert and oriented. No focal neurological deficits. Extremities: Symmetric 5 x 5 power.   Data Reviewed: Basic Metabolic Panel:  Recent Labs Lab 10/09/13 1000  10/11/13 1300 10/12/13 0845 10/13/13 0548 10/14/13 0551 10/15/13 0500  NA 125*  < > 123* 121* 125* 125* 121*  K 3.9  < > 4.3 4.5 5.0 4.7 5.2  CL 91*  < > 89* 86* 89* 89* 87*  CO2 25  < > 24 26 26 26 26   GLUCOSE 140*  < > 123* 125* 86 67* 87  BUN 25*  < > 32* 36* 33* 30* 30*  CREATININE 0.81  < > 0.88 0.89 0.92 0.92 0.97  CALCIUM 8.0*  < > 8.1* 8.8 8.6 8.1* 8.1*  MG 1.7  --   --   --   --   --   --   < > = values in this interval not displayed. Liver Function Tests:  Recent Labs Lab 10/09/13 1000 10/11/13 1300 10/12/13 0845 10/13/13 0548 10/14/13 0551  AST 54* 40* 42* 44* 45*  ALT 33 31 31 33 31  ALKPHOS 87 99 96 99 112  BILITOT 2.8* 3.2* 4.1* 4.3* 2.9*  PROT 7.2 7.0 7.0 7.1 7.2  ALBUMIN 2.0* 1.8* 2.1* 2.1* 1.9*   No results found for this basename: LIPASE, AMYLASE,  in the last 168 hours  Recent Labs Lab 10/11/13 2020  AMMONIA 24   CBC:  Recent Labs Lab 10/09/13 1000 10/11/13 1300 10/12/13 0845 10/14/13 0551  WBC 14.4* 14.2* 13.8* 11.1*  HGB 12.2* 12.7* 12.1* 12.5*  HCT 33.8* 35.9* 34.1* 35.9*  MCV 89.2 89.5 90.2 91.8  PLT 105* 132* 141* 142*   Cardiac Enzymes: No results found for this basename: CKTOTAL, CKMB, CKMBINDEX, TROPONINI,  in the last 168 hours BNP (last 3 results)  Recent Labs  09/02/13 0520 09/09/13 0430 10/07/13 1029  PROBNP 348.5* 383.9* 241.7*   CBG:  Recent Labs Lab 10/14/13 1621 10/14/13 2127 10/15/13 0638 10/15/13 0704  GLUCAP 104* 118* 70 86    Recent Results (from the past 240 hour(s))  URINE CULTURE     Status: None   Collection Time    10/07/13  3:02 PM      Result Value Ref Range Status   Specimen Description URINE, RANDOM   Final   Special Requests NONE    Final   Culture  Setup Time     Final   Value: 10/07/2013 20:54     Performed at [facility] Count     Final   Value: NO GROWTH     Performed at [facility]   Culture     Final   Value: NO GROWTH     Performed at [facility]   Report Status 10/08/2013 FINAL   Final  CULTURE, BLOOD (ROUTINE X 2)     Status: None   Collection Time    10/07/13  5:08 PM      Result Value Ref Range Status   Specimen Description BLOOD RIGHT ARM   Final   Special Requests BOTTLES DRAWN AEROBIC AND ANAEROBIC 10CC   Final   Culture  Setup Time     Final   Value: 10/07/2013 23:30     Performed at [facility]   Culture     Final   Value: NO GROWTH 5 DAYS  [facility]formed at [facility]   Report Status 10/13/2013 FINAL   Final  MRSA PCR SCREENING     Status: Abnormal   Collection Time    10/07/13  5:09 PM      Result Value Ref Range Status   MRSA by PCR POSITIVE (*) NEGATIVE Final   Comment:            The GeneXpert MRSA Assay (FDA     approved for NASAL specimens     only), is one component of a     comprehensive MRSA colonization     surveillance program. It is not     intended to diagnose MRSA     infection nor to guide or     monitor treatment for     MRSA infections.     RESULT CALLED TO, READ BACK BY AND VERIFIED WITH:     A.BRICKHOUSE,RN [id number] 10/07/13 M.CAMPBELL  CULTURE, BLOOD (ROUTINE X 2)     Status: None   Collection Time    10/07/13  5:14 PM      Result Value Ref Range Status   Specimen Description BLOOD LEFT ARM   Final   Special Requests     Final   Value: BOTTLES DRAWN AEROBIC AND ANAEROBIC AER 6CC,4CC ANA   Culture  Setup Time     Final   Value: 10/07/2013 23:27     Performed at [facility]   Culture     Final   Value: NO GROWTH 5 DAYS     Performed at [facility]   Report Status 10/13/2013 FINAL   Final  BODY FLUID CULTURE     Status: None   Collection Time    10/12/13 11:54 AM      Result Value Ref  Range Status   Specimen Description PLEURAL FLUID   Final   Special Requests NONE   Final   Gram Stain     Final   Value: FEW WBC PRESENT, PREDOMINANTLY PMN     NO ORGANISMS SEEN     Performed at [facility]   Culture     Final   Value: NO GROWTH 2 DAYS     Performed at [facility]   Report Status PENDING   Incomplete          Studies: Dg Chest 1 View  10/15/2013   CLINICAL DATA:  Right thoracentesis today, 2 L removed  EXAM: CHEST - 1 VIEW  COMPARISON:  10/13/2013  FINDINGS: Large right effusion and collapse of the right lung with complete opacification right hemi thorax. No pneumothorax.  Left lung remains clear.  IMPRESSION: Complete opacification right hemithorax due to fluid and collapse. No pneumothorax post right thoracentesis.   Electronically Signed   By: Marlan Palauharles  Clark M.D.   On: 10/15/2013 11:29   Dg Chest 1 View  10/13/2013   CLINICAL DATA:  Pleural effusion.  Status post RIGHT thoracentesis.  EXAM: CHEST - 1 VIEW  COMPARISON:  10/13/2013 at 11/1932 priors.  FINDINGS: There is considerable, near complete opacity at the RIGHT hemi thorax despite removal of what is reportedly a large amount of fluid. There is no visible pneumothorax. No osseous lesions.  There may be mild mediastinal shift from LEFT to RIGHT implying a component of this opacity relates to volume loss.  IMPRESSION: No visible pneumothorax following RIGHT thoracentesis. See discussion above.   Electronically Signed   By: Davonna BellingJohn  Curnes M.D.   On: 10/13/2013 15:30  [place] Thoracentesis Asp Pleural Space W/img Guide  10/13/2013   CLINICAL DATA:  Right pleural effusion  EXAM: ULTRASOUND GUIDED right THORACENTESIS  COMPARISON:  Previous thoracentesis  PROCEDURE: An ultrasound guided thoracentesis was thoroughly discussed with the patient and questions answered. The benefits, risks, alternatives and complications were also discussed. The patient understands and wishes to proceed with the procedure.  Written consent was obtained.  Ultrasound was performed to localize and mark an adequate pocket of fluid in the right chest. The area was then prepped and draped in the normal sterile fashion. 1% Lidocaine was used for local anesthesia. Under ultrasound guidance a 19 gauge Yueh catheter was introduced. Thoracentesis was performed. The catheter was removed and a dressing applied.  Complications:  None  FINDINGS: A total of approximately 1.5 L of orange reddish fluid was removed. A fluid sample was notsent for laboratory analysis.  IMPRESSION: Successful ultrasound guided right thoracentesis yielding 1.5 L of pleural fluid.  Read by:  Robet LeuPamela A Turpin   Electronically Signed   By: Malachy MoanHeath  McCullough M.D.   On: 10/13/2013 15:59        Scheduled Meds: . Chlorhexidine Gluconate Cloth  6 each Topical Q0600  . clonazePAM  0.5 mg Oral QHS  . fentaNYL  25 mcg Transdermal Q72H  . folic acid  1 mg Oral Daily  . furosemide  40 mg Intravenous BID  . lactulose  30 g Oral TID  . lidocaine (PF)      . mupirocin ointment  1 application Nasal BID  . rifaximin  550 mg Oral Q12H  . sodium chloride  3 mL Intravenous Q12H  . sodium chloride  1 g Oral TID WC  . spironolactone  100 mg Oral BID  . tamsulosin  0.4 mg Oral Daily  . thiamine  100 mg Oral Daily   Continuous Infusions:   Principal Problem:   Recurrent right pleural effusion Active Problems:   Hyponatremia   possible encephalopathy, hepatic   Ascites due to alcoholic cirrhosis   BPH (benign prostatic hyperplasia)   Liver cirrhosis   Alcohol abuse, in remission   Hepatitis C   Acute respiratory failure with hypoxia    Time spent: 20 mins    Charde Macfarlane, MD, [facility] Pager [phone]  If 7PM-7AM, please contact night-coverage [email or web address] Password [email or web address] 10/15/2013, 12:48 PM    LOS: 4 days

## 2013-10-16 ENCOUNTER — Inpatient Hospital Stay (HOSPITAL_COMMUNITY): Payer: Medicaid - Out of State

## 2013-10-16 DIAGNOSIS — B182 Chronic viral hepatitis C: Secondary | ICD-10-CM

## 2013-10-16 DIAGNOSIS — R188 Other ascites: Secondary | ICD-10-CM

## 2013-10-16 DIAGNOSIS — Z515 Encounter for palliative care: Secondary | ICD-10-CM

## 2013-10-16 DIAGNOSIS — F1010 Alcohol abuse, uncomplicated: Secondary | ICD-10-CM

## 2013-10-16 DIAGNOSIS — R531 Weakness: Secondary | ICD-10-CM

## 2013-10-16 LAB — BASIC METABOLIC PANEL
ANION GAP: 6 (ref 5–15)
BUN: 25 mg/dL — AB (ref 6–23)
CHLORIDE: 87 meq/L — AB (ref 96–112)
CO2: 26 meq/L (ref 19–32)
Calcium: 7.7 mg/dL — ABNORMAL LOW (ref 8.4–10.5)
Creatinine, Ser: 0.8 mg/dL (ref 0.50–1.35)
GFR calc Af Amer: >90 mL/min (ref >90)
GFR calc non Af Amer: >90 mL/min (ref >90)
Glucose, Bld: 97 mg/dL (ref 70–99)
Potassium: 5.2 mEq/L (ref 3.7–5.3)
Sodium: 119 mEq/L — CL (ref 137–147)

## 2013-10-16 LAB — BODY FLUID CULTURE: Culture: NO GROWTH

## 2013-10-16 LAB — GLUCOSE, CAPILLARY
GLUCOSE-CAPILLARY: 108 mg/dL — AB (ref 70–99)
Glucose-Capillary: 94 mg/dL (ref 70–99)
Glucose-Capillary: 127 mg/dL — ABNORMAL HIGH (ref 70–99)
Glucose-Capillary: 89 mg/dL (ref 70–99)

## 2013-10-16 MED ORDER — SPIRONOLACTONE 50 MG PO TABS
50.0000 mg | ORAL_TABLET | Freq: Two times a day (BID) | ORAL | Status: DC
  Administered 2013-10-16 – 2013-10-17 (×2): 50 mg via ORAL
  Filled 2013-10-16 (×4): qty 1

## 2013-10-16 MED ORDER — OXYCODONE HCL 5 MG PO TABS
5.0000 mg | ORAL_TABLET | Freq: Four times a day (QID) | ORAL | Status: DC | PRN
  Administered 2013-10-16 – 2013-10-28 (×30): 5 mg via ORAL
  Filled 2013-10-16 (×30): qty 1

## 2013-10-16 MED ORDER — MORPHINE SULFATE 2 MG/ML IJ SOLN
0.5000 mg | INTRAMUSCULAR | Status: DC | PRN
  Administered 2013-10-16 – 2013-10-17 (×3): 0.5 mg via INTRAVENOUS
  Filled 2013-10-16 (×3): qty 1

## 2013-10-16 NOTE — Consult Note (Signed)
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  Patient is hopeful for medical interventions to prolong life and possible liver transplant.    Patient and family faced with advanced directive decisions and anticipatory care needs  Consult is for review of medical treatment options, clarification of goals of care and end of life issues, disposition and options, and symptom recommendation.  This NP Lorinda Creed reviewed medical records, received report from team, assessed the patient and then meet at the patient's bedside along with his two sisters, Theresia Lo and Vickie Iddings to discuss diagnosis,  prognosis, GOC,  disposition and options.  A detailed discussion was had today regarding advanced directives.  Concepts specific to code status, artifical feeding and hydration, continued IV antibiotics and rehospitalization was had.  The difference between a aggressive medical intervention path  and a palliative comfort care path for this patient at this time was had.  Values and goals of care important to patient and family were attempted to be elicited.  Concept of Hospice and Palliative Care were discussed and its role in holistic patient centered care   Questions and concerns addressed.  Family encouraged to call with questions or concerns.  PMT will continue to support holistically.   Goals of Care: 1.  Code Status:  DNR/DNI- previously documented   2. Scope of Treatment:  Patient is open to all offered and available medical interventions to prolong life.  1. Consults: GI consult to offer possible treatment options and necessary coarse to  pursue liver transplant.   3. Disposition:  Likely home with home health services pursueing treatment to prolong life.   4. Symptom Management:  1.  Weakness:  Continued medical management of chronic diseases.  PT on discharge as tolerated    5. Psychosocial:  Emotional support offered to pateint and family.  This is a difficult situation for all.  6. Spiritual: Declined chaplain consult   Patient Documents Completed or Given: Document Given Completed  Advanced Directives Pkt    MOST X   DNR    Gone from My Sight    Hard Choices X     Brief HPI: Xavier Valentine is a 53 y.o. male.  known to Dr Christella Hartigan during unassigned call hospital consult on 09/22/13. Hx chronic EOTH/Hep C (dx ~ 2000), R Hernia repair at [facility] in mid to late 8/20-15. Told at that time he had cirrhosis but wasn't having issues with ascites.. Admission to [facility] hospital 8/27-09/09/13 for sepsis due to HCAP, hypoxic resp failure, ascites, bleeding from incision site at right groin. Coagulopathic and thrombocytopenic, anemic. tox screen + for cocaine, opiates. Treated for ETOH withdrawal and hepatic encephalopathy. It was after his surgery and during the [facility] admission that ascites/anasarca became apparent. Also has coagulopathy. Numerous admissions starting with the 08/2013 [facility] admission. 3 admissions in the last 25 days.   ROS: weakness, anasarca, weakness and fatigue   PMH:  Past Medical History  Diagnosis Date  . Cirrhosis 08/2013    ETOH and Hep C  . Ascites 09/2013  . Thrombocytopenia 09/2013  . Coagulopathy 09/2013  due to hepatic dysfunction.   . Pneumonia 09/2013  . Chronic alcoholism   . Hepatitis C dx'd ~ 2000  . Anxiety   . Inguinal hernia     repaired in [place] 08/2013     PSH: Past Surgical History  Procedure Laterality Date  . Incision and drainage perirectal abscess  1980's  . Inguinal hernia repair Right 08/2013    at [facility] [place].    I have reviewed the FH and SH and  If  appropriate update it with new information. Allergies  Allergen Reactions  . Bee Venom Swelling   Scheduled Meds: . Chlorhexidine Gluconate Cloth  6 each Topical Q0600  . clonazePAM  0.5 mg Oral QHS  . fentaNYL  25 mcg Transdermal Q72H  . folic acid  1 mg Oral Daily  . furosemide  40 mg Intravenous BID  . lactulose  30 g Oral TID  . mupirocin ointment  1 application Nasal BID  . rifaximin  550 mg Oral Q12H  . sodium chloride  3 mL Intravenous Q12H  . sodium chloride  1 g Oral TID WC  . spironolactone  50 mg Oral BID  . tamsulosin  0.4 mg Oral Daily  . thiamine  100 mg Oral Daily   Continuous Infusions:  PRN Meds:.albuterol, docusate sodium, guaiFENesin-dextromethorphan, morphine injection, ondansetron (ZOFRAN) IV, ondansetron, polyethylene glycol    BP 117/70  Pulse 74  Temp(Src) 97.8 F (36.6 C) (Oral)  Resp 18  Ht 5\' 11"  (1.803 m)  Wt 82.192 kg (181 lb 3.2 oz)  BMI 25.28 kg/m2  SpO2 96%   PPS: 40 %   Intake/Output Summary (Last 24 hours) at 10/16/13 0940 Last data filed at 10/16/13 0930  Gross per 24 hour  Intake   1080 ml  Output   1550 ml  Net   -470 ml    Physical Exam:  General: chronically ill appearing HEENT:  + temporal muscle wasting, juandice sclera Chest:  Decreased in bases,  CVS: RRR Abdomen:distended, tympanic, distant+  BS Ext: BLE +3 edema Neuro:  Alert and oriented X3 Pstch: limited insight into his situation    Labs: CBC    Component Value Date/Time   WBC 11.1* 10/14/2013 0551   RBC 3.91* 10/14/2013 0551   HGB 12.5* 10/14/2013 0551   HCT 35.9* 10/14/2013 0551   PLT 142* 10/14/2013 0551   MCV 91.8 10/14/2013 0551   MCH 32.0 10/14/2013 0551   MCHC 34.8 10/14/2013 0551   RDW 15.2 10/14/2013 0551   LYMPHSABS 2.1 10/06/2013 0220   MONOABS 0.8 10/06/2013 0220   EOSABS 0.3 10/06/2013 0220   BASOSABS 0.0 10/06/2013 0220    BMET    Component Value Date/Time   NA 121* 10/15/2013 0500   K 5.2 10/15/2013 0500   CL 87* 10/15/2013 0500    CO2 26 10/15/2013 0500   GLUCOSE 87 10/15/2013 0500   BUN 30* 10/15/2013 0500   CREATININE 0.97 10/15/2013 0500   CALCIUM 8.1* 10/15/2013 0500   GFRNONAA >90 10/15/2013 0500   GFRAA >90 10/15/2013 0500    CMP     Component Value Date/Time   NA 121* 10/15/2013 0500   K 5.2 10/15/2013 0500   CL 87* 10/15/2013 0500   CO2 26 10/15/2013 0500   GLUCOSE 87 10/15/2013 0500   BUN 30* 10/15/2013 0500   CREATININE 0.97 10/15/2013 0500   CALCIUM 8.1* 10/15/2013 0500   PROT 7.2 10/14/2013 0551   ALBUMIN 1.9* 10/14/2013 0551   AST 45* 10/14/2013 0551  ALT 31 10/14/2013 0551   ALKPHOS 112 10/14/2013 0551   BILITOT 2.9* 10/14/2013 0551   GFRNONAA >90 10/15/2013 0500   GFRAA >90 10/15/2013 0500     Time In Time Out Total Time Spent with Patient Total Overall Time   0830 1030 100 min  120 min    Greater than 50%  of this time was spent counseling and coordinating care related to the above assessment and plan.   Lorinda CreedMary Jocelynne Duquette NP  Palliative Medicine Team Team Phone # [phone] Pager [phone]  Discussed with Dr Blake DivineAkula

## 2013-10-16 NOTE — Clinical Social Work Note (Signed)
CSW spoke with patient who reports plan to apply for Medicaid in [place] as he is living here on his sisters property- he has Medicaid in [place] and at this time is planning to remain here and wants to get [place] Medicaid. Chiropodistinancial Counselor messaged and asked to f/u with patient regarding [place] Medicaid. No further CSW needs at this time. Reece LevyJanet Darrly Loberg, MSW, Theresia MajorsLCSWA [phone]

## 2013-10-16 NOTE — Consult Note (Signed)
[facility] Consult: 10:28 AM 10/16/2013  LOS: 5 days    Referring Provider: Dr Blake DivineAkula of [facility]  Primary Care Physician:  Doris CheadleADVANI, DEEPAK, MD Primary Gastroenterologist:  Dr. Christella HartiganJacobs, not yet seen back in GI office.      Reason for Consultation:  Prognosis of his liver disease and suggestions [facility] to [facility]usions.    HPI: Xavier DuskyRichard L Valentine is a 53 y.o. male. Became known to Dr Christella HartiganJacobs during unassigned call hospital consult on 09/22/13. Hx chronic EOTH/Hep C (dx ~ 2000), [facility] in mid to late 8/20-15. Told at that time he had cirrhosis but wasn't having issues with ascites. Non compliant with abx after this. [facility]09[facility]13 for sepsis due to HCAP, hypoxic resp failure, ascites, bleeding from incision site at right groin. Coagulopathic and thrombocytopenic, anemic. tox screen + for cocaine, opiates. Treated for ETOH withdrawal and hepatic encephalopathy. It was after his surgery and during [facility] that ascites/anasarca became apparent. Also has coagulopathy.  Numerous admissions starting with the [facility] admission.  3 admissions in the last 25 days.  Relocated to [place] 08/2013 and living with sister, was homeless in [place].   Pt claims he is on liver transplant list but this is not correct.  Has not even been seen in follow up by GI;  having cancelled the 10/8 GI ROV as he was inpt that day.  [facility] and [facility] fail to confirm any future GI/liver clinic appts.    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  No SBP.  10/7 abdominal ultrasound: not enough ascites to tap.  9/26 thoracentesis of 1.4 liters: transudate, hepatic hydrothorax.  10/10 thoracentesis of 1.5  liters 10/12 thoracentesis of 1.5 liters.  10/14 thoracentesis of 2 liters.  Had rib fractures on 10/10 films, felt secondary to vigorous coughing.   Has had issues with hyponatremia and renal recommended cessation of spironolactone, low during 9/26 - 9/30 admission. But as of most recent 10/6 - 10/9 admission diuretics at discharge were: Aldactone 100 mg BID, Furosemide 80 mg BID.   This current admission, occuring one day post discharge 10/9, was prompted by worsening pleural effusion and left rib pain.  He had been unable to fill diuretic Rx, went to wrong pharmacy.  Hydrothorax re accumulated on xray. Pulmonary suggests avoiding chest tube, palliative care consult (which will happen today but he is DNR), ? if TIPS might be helpful in managing hydrothorax.   He has had thora x 2 during this admission.   Sodium as low as 121 on 10/11.  125 today.  Dose of aldactone now at 50 mg BID, lasix at 40 mg BID.  He is not oliguric.  LFTs minimally elevated. t bili is down from 4.3 to 2.9.  Albumin is low.   Had become more confused after no BMs for 2 or 3 days.  Rifaximin initiated 10/10 had several BMs yesterday and feeling less confused.  Feels like his abdomen  and limbs are swelling but weight is down 8# in last 6 days.      Past Medical History  Diagnosis Date  . Cirrhosis 08/2013    ETOH and Hep C  . Ascites 09/2013  . Thrombocytopenia 09/2013  . Coagulopathy 09/2013    due to hepatic dysfunction.   . Pneumonia 09/2013  . Chronic alcoholism   . Hepatitis C dx'd ~ 2000  . Anxiety   . Inguinal hernia     repaired in [place] 08/2013    Past Surgical History  Procedure Laterality Date  . Incision and drainage perirectal abscess  1980's  . Inguinal hernia repair Right 08/2013    at [facility] in [place].     Prior to Admission medications   Medication Sig Start Date End Date Taking? Authorizing Provider  albuterol (PROVENTIL HFA;VENTOLIN HFA) 108 (90 BASE) MCG/ACT inhaler Inhale 2 puffs  into the lungs daily as needed for wheezing or shortness of breath.   Yes Historical Provider, MD  clonazePAM (KLONOPIN) 0.5 MG tablet Take 1 tablet (0.5 mg total) by mouth daily as needed for anxiety. 10/09/13  Yes Leroy Sea, MD  docusate sodium (COLACE) 100 MG capsule Take 1 capsule (100 mg total) by mouth 2 (two) times daily as needed for mild constipation. Hold if you have diarrhea. 10/01/13  Yes Tora Kindred York, PA-C  folic acid (FOLVITE) 1 MG tablet Take 1 tablet (1 mg total) by mouth daily. 09/09/13  Yes Clydia Llano, MD  furosemide (LASIX) 40 MG tablet Take 2 tablets (80 mg total) by mouth 2 (two) times daily. 10/10/13  Yes Leroy Sea, MD  lactulose (CHRONULAC) 10 GM/15ML solution Take 45 mLs (30 g total) by mouth daily. 10/01/13  Yes Marianne L York, PA-C  Multiple Vitamin (MULTIVITAMIN WITH MINERALS) TABS tablet Take 1 tablet by mouth daily. 09/09/13  Yes Clydia Llano, MD  oxyCODONE (ROXICODONE) 5 MG immediate release tablet Take 1 tablet (5 mg total) by mouth every 4 (four) hours as needed for severe pain. 10/09/13  Yes Leroy Sea, MD  spironolactone (ALDACTONE) 100 MG tablet Take 1 tablet (100 mg total) by mouth 2 (two) times daily. 10/10/13  Yes Leroy Sea, MD  tamsulosin (FLOMAX) 0.4 MG CAPS capsule Take 1 capsule (0.4 mg total) by mouth daily. 09/23/13  Yes Tora Kindred York, PA-C  thiamine 100 MG tablet Take 1 tablet (100 mg total) by mouth daily. 09/09/13  Yes Clydia Llano, MD    Scheduled Meds: . Chlorhexidine Gluconate Cloth  6 each Topical Q0600  . clonazePAM  0.5 mg Oral QHS  . fentaNYL  25 mcg Transdermal Q72H  . folic acid  1 mg Oral Daily  . furosemide  40 mg Intravenous BID  . lactulose  30 g Oral TID  . mupirocin ointment  1 application Nasal BID  . rifaximin  550 mg Oral Q12H  . sodium chloride  3 mL Intravenous Q12H  . sodium chloride  1 g Oral TID WC  . spironolactone  50 mg Oral BID  . tamsulosin  0.4 mg Oral Daily  . thiamine  100 mg Oral Daily    Infusions:   PRN Meds: albuterol, docusate sodium, guaiFENesin-dextromethorphan, ondansetron (ZOFRAN) IV, ondansetron, oxyCODONE, polyethylene glycol   Allergies as of 10/11/2013 - Review Complete 10/11/2013  Allergen Reaction Noted  . Bee venom Swelling 08/28/2013    Family History  Problem Relation Age of Onset  . CAD    . Hypertension    . Diabetes  Mother   . Hypertension Mother   . Cancer Father   . Cancer Maternal Grandfather     History   Social History  . Marital Status: Single    Spouse Name: N/A    Number of Children: N/A  . Years of Education: N/A   Occupational History  . Not on file.   Social History Main Topics  . Smoking status: Former Smoker -- 10 years    Types: Cigarettes  . Smokeless tobacco: Never Used     Comment: "smoked 1/2 pack/wk when I did smoke"  . Alcohol Use: Yes     Comment: 10/07/2013 "I've had 2 drinks since 08/2013; I"ve quit"  . Drug Use: 0.20 per week    Special: Marijuana, Cocaine     Comment: 10/07/2013 "last cocaine 08/2013; last marijuana was a couple days ago"  . Sexual Activity: Yes    Birth Control/ Protection: Condom   Other Topics Concern  . Not on file   Social History Narrative  . No narrative on file    REVIEW OF SYSTEMS: Constitutional:  Per HPI. ENT:  No nose bleeds Pulm:  SOB better CV:  No palpitations, no LE edema.  GU:  No hematuria, no frequency GI:  Per HPI.  Eating well.  No nausea.  Heme:  No unusual bleeding or bruising   Transfusions:  none Neuro:  No headaches, no peripheral tingling or numbness Derm:  No itching, no rash or sores.  Endocrine:  No sweats or chills.  No polyuria or dysuria Immunization:  Not queried  Travel:  None beyond local counties in last few months.    PHYSICAL EXAM: Vital signs in last 24 hours: Filed Vitals:   10/16/13 0444  BP: 117/70  Pulse: 74  Temp: 97.8 F (36.6 C)  Resp: 18   Wt Readings from Last 3 Encounters:  10/16/13 82.192 kg (181 lb 3.2 oz)   10/10/13 86.1 kg (189 lb 13.1 oz)  10/01/13 76.2 kg (167 lb 15.9 oz)   General: chronically ill and malnourished looking WM.  Comfortable and NAD.  Mildly jaundiced Head:  No swelling or asymmetry.  + temporal wasting  Eyes:  Slight icterus Ears:  Not HOH  Nose:  No congestion Mouth:  Clear moist.  Poor dentition Neck:  No mass, no TMG Lungs:  Absent throughout on left.  No cough, no SOB Heart: RRR.  No mrg Abdomen:  Soft, protuberant, no fluid wave.  Not tender.  BS present and tympanitic.   Rectal: deferred   Musc/Skeltl: no joint redness.  + kyphosis.  Extremities:  2 to 3 + pedal and shin edema  Neurologic:  Oriented  X 3.  Slight tremor vs asterixis in hands.  Moves all 4 limbs Skin:  Sallow, jaundiced Nodes:  No cervical adenopathy.    Psych:  Cooperative, anxious, pressured speech.   Intake/Output from previous day: 10/14 0701 - 10/15 0700 In: 840 [P.O.:840] Out: 1900 [Urine:1900] Intake/Output this shift: Total I/O In: 240 [P.O.:240] Out: -   LAB RESULTS:  Recent Labs  10/14/13 0551  WBC 11.1*  HGB 12.5*  HCT 35.9*  PLT 142*   BMET Lab Results  Component Value Date   NA 121* 10/15/2013   NA 125* 10/14/2013   NA 125* 10/13/2013   K 5.2 10/15/2013   K 4.7 10/14/2013   K 5.0 10/13/2013   CL 87* 10/15/2013   CL 89* 10/14/2013   CL 89* 10/13/2013   CO2 26 10/15/2013   CO2 26  10/14/2013   CO2 26 10/13/2013   GLUCOSE 87 10/15/2013   GLUCOSE 67* 10/14/2013   GLUCOSE 86 10/13/2013   BUN 30* 10/15/2013   BUN 30* 10/14/2013   BUN 33* 10/13/2013   CREATININE 0.97 10/15/2013   CREATININE 0.92 10/14/2013   CREATININE 0.92 10/13/2013   CALCIUM 8.1* 10/15/2013   CALCIUM 8.1* 10/14/2013   CALCIUM 8.6 10/13/2013   LFT  Recent Labs  10/14/13 0551  PROT 7.2  ALBUMIN 1.9*  AST 45*  ALT 31  ALKPHOS 112  BILITOT 2.9*   PT/INR Lab Results  Component Value Date   INR 1.95* 10/07/2013   INR 1.95* 10/06/2013   INR 2.17* 09/27/2013   Hepatitis  Panel No results found for this basename: HEPBSAG, HCVAB, HEPAIGM, HEPBIGM,  in the last 72 hours C-Diff No components found with this basename: cdiff   Lipase     Component Value Date/Time   LIPASE 55 09/21/2013 0810    Drugs of Abuse     Component Value Date/Time   LABOPIA POSITIVE* 08/29/2013 0022   COCAINSCRNUR POSITIVE* 08/29/2013 0022   LABBENZ NONE DETECTED 08/29/2013 0022   AMPHETMU NONE DETECTED 08/29/2013 0022   THCU NONE DETECTED 08/29/2013 0022   LABBARB NONE DETECTED 08/29/2013 0022     RADIOLOGY STUDIES: Dg Chest 1 View  10/15/2013   CLINICAL DATA:  Right thoracentesis today, 2 L removed  EXAM: CHEST - 1 VIEW  COMPARISON:  10/13/2013  FINDINGS: Large right effusion and collapse of the right lung with complete opacification right hemi thorax. No pneumothorax.  Left lung remains clear.  IMPRESSION: Complete opacification right hemithorax due to fluid and collapse. No pneumothorax post right thoracentesis.   Electronically Signed   By: Marlan Palau M.D.   On: 10/15/2013 11:29   US Thoracentesis Asp Pleural Space W/img Guide  10/15/2013   CLINICAL DATA:  Right pleural effusion  EXAM: ULTRASOUND GUIDED right THORACENTESIS  COMPARISON:  Previous right thoracentesis  PROCEDURE: An ultrasound guided thoracentesis was thoroughly discussed with the patient and questions answered. The benefits, risks, alternatives and complications were also discussed. The patient understands and wishes to proceed with the procedure. Written consent was obtained.  Ultrasound was performed to localize and mark an adequate pocket of fluid in the row chest. The area was then prepped and draped in the normal sterile fashion. 1% Lidocaine was used for local anesthesia. Under ultrasound guidance a 19 gauge Yueh catheter was introduced. Thoracentesis was performed. The catheter was removed and a dressing applied.  Complications:  None  FINDINGS: A total of approximately 2 L of orange reddish fluid was removed. A  fluid sample was notsent for laboratory analysis.  IMPRESSION: Successful ultrasound guided right thoracentesis yielding 2 L of pleural fluid.  Read by:  Robet Leu [facility]   Electronically Signed   By: Gilmer Mor D.O.   On: 10/15/2013 13:51    ENDOSCOPIC STUDIES: No colon or EGD in [place].   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  As of 10/6 (last check of coags)  Child's-Pugh score was 13, MELD was 40..  *  Hepatic hydrothorax.  S/p multiple thoracentesis. Diuretic therapy constrained by hyponatremia.   *  Ascites.  No SBP on paracentesis.   *  Coagulopathy.   *  Hepatic encephalopathy.  On lactulose at home but ? Compliance.   MS improved after multiple BMs 10/14.  Still not a reliable historian but is "with it" enough to consent  to procedures.  *  Alcoholism and drug abuse.  Claims abstinence since late 08/2013.  No tox screens since 08/29/13.     PLAN:     *  Per Dr Arlyce DiceKaplan.  *  Await goals of care consult with [facility].    Jennye MoccasinSarah Gribbin  10/16/2013, 10:28 AM Pager: [phone]  GI Attending Note   Chart was reviewed and patient was examined. X-rays and lab were reviewed.   Pt is likely going to reaccumulate ascites and develop a hydrothorax soon after any theraputic procedures to remove fluid from abdomen or chest.  Accordingly I think he should undergo either a TIPS procedure or ventriculoperitoneal shunt.  If hepatic encephalopathy is a major concern would lean in favor of a shunt procedure.  Barbette Hairobert D. Arlyce DiceKaplan, M.D., [facility] Cell [phone]

## 2013-10-16 NOTE — Progress Notes (Signed)
PULMONARY / CRITICAL CARE MEDICINE   Name: Xavier DuskyRichard L Valentine MRN: [id number] DOB: 1960-03-12    ADMISSION DATE:  10/11/2013 CONSULTATION DATE:  10/11/2013  REFERRING MD :  Thedore MinsSingh  CHIEF COMPLAINT:  SOB  INITIAL PRESENTATION:   53 y/o male with PMH of hepC/ETOH cirrhosis, thrombocytopenia, hx of right sided transudative pleural effusions with recent thoracentesis (9/26 with 1.4L off), just d/c 10/9 (Decision at that time made to d/c on high dose of lasix and not repeat thora).  Returned 10/10 with left sided chest/rib cage pain. CXR in the ED demonstrated a reaccumulated right sided pleural effusion in addition to hyponatremia (both chronic in nature)  STUDIES:  10/10  DG ribs > Increased size of large right pleural effusion causing complete opacification of right hemithorax/ multiple L rib fx   SIGNIFICANT EVENTS: 9/20  paracentesis with 3.6 liters yellow tinged fluid removed.  9/26  thoracentesis  X 1.4 liters- transudate, hepatic hydrothorax  10/5  to ED with abd distention, improved with BM and discharged  10/6  to ED with SOB and chest pain, admitted with effusion.   10/10 readmitted for acute dyspnea and reaccumulation of fluid 10/10 Thoracentesis 1.5L orange, slightly turbid fluid  10/12  Thora by IR, 1.5 L fluid removed SUBJECTIVE:  SOB improved, continues to c/o rib pain, anxiety.   VITAL SIGNS: Temp:  [97.5 F (36.4 C)-98 F (36.7 C)] 97.8 F (36.6 C) (10/15 0444) Pulse Rate:  [74-84] 74 (10/15 0444) Resp:  [18] 18 (10/15 0444) BP: (88-131)/(45-72) 117/70 mmHg (10/15 0444) SpO2:  [96 %-100 %] 96 % (10/15 0444) Weight:  [181 lb 3.2 oz (82.192 kg)] 181 lb 3.2 oz (82.192 kg) (10/15 0444)  INTAKE / OUTPUT:  Intake/Output Summary (Last 24 hours) at 10/16/13 0927 Last data filed at 10/16/13 0558  Gross per 24 hour  Intake    840 ml  Output   1550 ml  Net   -710 ml    PHYSICAL EXAMINATION: General:  Chronically ill appearing Neuro: awake alert, tangential but  oriented X3 HEENT:  PERRL (pinpoint); scleral icterus  Cardiovascular:  RRR, s1/2  Lungs:  Decreased breath sounds on right Abdomen:  Full, distended, hypoactive BS Musculoskeletal:  Pain on palpation of rib cage; full ROM  Skin: jaundice   LABS:  CBC  Recent Labs Lab 10/11/13 1300 10/12/13 0845 10/14/13 0551  WBC 14.2* 13.8* 11.1*  HGB 12.7* 12.1* 12.5*  HCT 35.9* 34.1* 35.9*  PLT 132* 141* 142*   Coag's No results found for this basename: APTT, INR,  in the last 168 hours BMET  Recent Labs Lab 10/13/13 0548 10/14/13 0551 10/15/13 0500  NA 125* 125* 121*  K 5.0 4.7 5.2  CL 89* 89* 87*  CO2 26 26 26   BUN 33* 30* 30*  CREATININE 0.92 0.92 0.97  GLUCOSE 86 67* 87   Electrolytes  Recent Labs Lab 10/09/13 1000  10/13/13 0548 10/14/13 0551 10/15/13 0500  CALCIUM 8.0*  < > 8.6 8.1* 8.1*  MG 1.7  --   --   --   --   < > = values in this interval not displayed.  Liver Enzymes  Recent Labs Lab 10/12/13 0845 10/13/13 0548 10/14/13 0551  AST 42* 44* 45*  ALT 31 33 31  ALKPHOS 96 99 112  BILITOT 4.1* 4.3* 2.9*  ALBUMIN 2.1* 2.1* 1.9*   Imaging Dg Chest 1 View  10/15/2013   CLINICAL DATA:  Right thoracentesis today, 2 L removed  EXAM: CHEST - 1 VIEW  COMPARISON:  10/13/2013  FINDINGS: Large right effusion and collapse of the right lung with complete opacification right hemi thorax. No pneumothorax.  Left lung remains clear.  IMPRESSION: Complete opacification right hemithorax due to fluid and collapse. No pneumothorax post right thoracentesis.   Electronically Signed   By: Marlan Palauharles  Clark M.D.   On: 10/15/2013 11:29   [place] Thoracentesis Asp Pleural Space W/img Guide  10/15/2013   CLINICAL DATA:  Right pleural effusion  EXAM: ULTRASOUND GUIDED right THORACENTESIS  COMPARISON:  Previous right thoracentesis  PROCEDURE: An ultrasound guided thoracentesis was thoroughly discussed with the patient and questions answered. The benefits, risks, alternatives and  complications were also discussed. The patient understands and wishes to proceed with the procedure. Written consent was obtained.  Ultrasound was performed to localize and mark an adequate pocket of fluid in the row chest. The area was then prepped and draped in the normal sterile fashion. 1% Lidocaine was used for local anesthesia. Under ultrasound guidance a 19 gauge Yueh catheter was introduced. Thoracentesis was performed. The catheter was removed and a dressing applied.  Complications:  None  FINDINGS: A total of approximately 2 L of orange reddish fluid was removed. A fluid sample was notsent for laboratory analysis.  IMPRESSION: Successful ultrasound guided right thoracentesis yielding 2 L of pleural fluid.  Read by:  Robet LeuPamela A Turpin [facility]   Electronically Signed   By: Gilmer MorJaime  Wagner D.O.   On: 10/15/2013 13:51     ASSESSMENT / PLAN:    Recurrent R pleural effusion -  Previously transudative on multiple recent taps, but 10/11 meets Light's criteria for exudate by LDH as well as increased WBC (>2000 10/11, previously 400-700).  ??seeded infection with multiple recent instrumentation pleural space vs pseudoexudate.   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  He clinically does not look like he has an empyema -Strict i/o -O2 to maintain sats > 92% -Consider TIPS at some point given recurrent nature of effusion -Nebs prn -Would avoid chest tube in setting hepatic hydrothorax (would be extremely difficult to remove), but may need to consider if high suspicion for infection or if deteriorates clinically.    -no sig improvement CXR despite repeated thoracentesis for total ~5L off over last 3 days, will CT chest 10/15 -f/u cxr in am  -f/u chem  -palliative meeting scheduled 10/15 - will hold CT chest until goals of care established this am   Dirk DressKaty  Whiteheart, NP 10/16/2013  9:31 AM Pager: [phone] or [phone]  *Care during the described time interval was provided by me and/or other providers on the critical care team. I have reviewed this patient's available data, including medical history, events of note, physical examination and test results as part of my evaluation.  Levy Pupaobert Ephrem Carrick, MD, PhD 10/16/2013, 12:01 PM [facility] [phone] or if no answer [phone]

## 2013-10-16 NOTE — Progress Notes (Addendum)
CRITICAL VALUE ALERT  Critical value received:  NA 119  Date of notification:  10/16/2013  Time of notification:  1239  Critical value read back:Yes.    Nurse who received alert:  Shelbie Hutchingominique Wrenna Saks  MD notified (1st page):  Dr. Blake DivineAkula  Time of first page:  1249  MD notified (2nd page):  Time of second page:  Responding MD: Dr.  Blake DivineAkula  Time MD responded:  [phone]

## 2013-10-16 NOTE — Progress Notes (Signed)
PROGRESS NOTE    Xavier Valentine ZOX:[id number] DOB: 1960/10/29 DOA: 10/11/2013 PCP: [name], MD  HPI/Brief narrative 53 year old male with history of hep C/EtOH cirrhosis, coagulopathy, thrombocytopenia, right-sided transudative pleural effusions s/p thoracentesis x1 on 09/27/13, discharged from hospital on 10/10/13, are admitted on 10/11/13 secondary to worsening left-sided chest wall pain and noted to have large right-sided pleural effusion. During previous admission, pulmonology had recommended treating his pleural effusion with Lasix and holding of thoracentesis. Pulmonology was consulted again patient undergoing repeated right thoracentesis by IR. Palliative care consulted for goals of care on 10/13.  Assessment/Plan:  1. 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 Pulmonology consulted and initially placed him on IV Lasix and oral Aldactone, without significant improvement. Lasix stopped for chronic hyponatremia. As per pulmonary, patient to undergo multiple thoracentesis until dry, advised no chest tube due to difficulty removing (hepatic hydrothorax) and consider antibiotics if declines (fever, leukocytosis). Pleural fluid culture negative to date. Underwent left thoracocentesis today. Plan for repeat CXR today. 2. Left sided chest pain/ 7 & 8 rib fractures: This was made worse by dyspnea from problem #1. Pain management and monitor. Improved. 3. 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 Added sodium chloride tabs. Fluid restriction. Follow BMP closely.  4. Hep C/EtOH cirrhosis/coagulopathy/thrombocytopenia/? Encephalopathy: Ammonia normal.  As per patient, has not had any alcohol for 2.5 months. Continue Rifaximin and lactulose. Patient apparently has followup appointment with local hepatology clinic and GI and never followed up. GI Consulted for recommendations.  5. History of alcohol abuse: Ativan protocol. No overt  withdrawal. 6. Ascites: recent ultrasound apparently had not enough ascites to tap. Denies abdominal pain. No features suggestive of SBP. Monitor. 7. Failure to thrive: Discussed extensively with patient and patient's sister/health care power of attorney [name]-agreeable to palliative care consult for goals of care.    Code Status: DO NOT RESUSCITATE Family Communication: none at bedside Disposition Plan: Home when medically stable.   Consultants:  Pulmonology  Palliative care team-pending  gastroenterology  Procedures:  Right therapeutic thoracentesis 10/11, 10/12, 10/14  Antibiotics:  Rifaximin   Subjective: Dyspnea improved. No chest pain, sob.  Objective: Filed Vitals:   10/15/13 1105 10/15/13 1450 10/15/13 2021 10/16/13 0444  BP: 93/49 124/69 112/72 117/70  Pulse:  80 84 74  Temp:  98 F (36.7 C) 97.5 F (36.4 C) 97.8 F (36.6 C)  TempSrc:  Oral Oral Oral  Resp:  18 18 18   Height:      Weight:    82.192 kg (181 lb 3.2 oz)  SpO2:  100% 97% 96%    Intake/Output Summary (Last 24 hours) at 10/16/13 1439 Last data filed at 10/16/13 0930  Gross per 24 hour  Intake   1080 ml  Output   1250 ml  Net   -170 ml   Filed Weights   10/14/13 0422 10/15/13 0450 10/16/13 0444  Weight: 81.103 kg (178 lb 12.8 oz) 82.781 kg (182 lb 8 oz) 82.192 kg (181 lb 3.2 oz)     Exam:  General exam: Younger, chronically ill-looking male lying comfortably propped up in bed.  Respiratory system: -much improved air entry. Reproducible lower left sided chest wall tenderness. No increased work of breathing. Cardiovascular system: S1 & S2 heard, RRR. No JVD, murmurs, gallops, clicks or pedal edema. Telemetry: Sinus rhythm. Gastrointestinal system: Abdomen is mildly distended, soft and nontender. Normal bowel sounds heard. Central nervous system: Alert and oriented. No focal neurological deficits. Extremities: Symmetric 5 x 5 power.  Data Reviewed: Basic Metabolic  Panel:  Recent Labs Lab 10/12/13 0845 10/13/13 0548 10/14/13 0551 10/15/13 0500 10/16/13 1145  NA 121* 125* 125* 121* 119*  K 4.5 5.0 4.7 5.2 5.2  CL 86* 89* 89* 87* 87*  CO2 26 26 26 26 26   GLUCOSE 125* 86 67* 87 97  BUN 36* 33* 30* 30* 25*  CREATININE 0.89 0.92 0.92 0.97 0.80  CALCIUM 8.8 8.6 8.1* 8.1* 7.7*   Liver Function Tests:  Recent Labs Lab 10/11/13 1300 10/12/13 0845 10/13/13 0548 10/14/13 0551  AST 40* 42* 44* 45*  ALT 31 31 33 31  ALKPHOS 99 96 99 112  BILITOT 3.2* 4.1* 4.3* 2.9*  PROT 7.0 7.0 7.1 7.2  ALBUMIN 1.8* 2.1* 2.1* 1.9*   No results found for this basename: LIPASE, AMYLASE,  in the last 168 hours  Recent Labs Lab 10/11/13 2020  AMMONIA 24   CBC:  Recent Labs Lab 10/11/13 1300 10/12/13 0845 10/14/13 0551  WBC 14.2* 13.8* 11.1*  HGB 12.7* 12.1* 12.5*  HCT 35.9* 34.1* 35.9*  MCV 89.5 90.2 91.8  PLT 132* 141* 142*   Cardiac Enzymes: No results found for this basename: CKTOTAL, CKMB, CKMBINDEX, TROPONINI,  in the last 168 hours BNP (last 3 results)  Recent Labs  09/02/13 0520 09/09/13 0430 10/07/13 1029  PROBNP 348.5* 383.9* 241.7*   CBG:  Recent Labs Lab 10/15/13 0704 10/15/13 1715 10/15/13 2116 10/16/13 0609 10/16/13 1114  GLUCAP 86 94 104* 94 127*    Recent Results (from the past 240 hour(s))  URINE CULTURE     Status: None   Collection Time    10/07/13  3:02 PM      Result Value Ref Range Status   Specimen Description URINE, RANDOM   Final   Special Requests NONE   Final   Culture  Setup Time     Final   Value: 10/07/2013 20:54     [facility]   Colony Count     Final   Value: NO GROWTH     [facility]   Culture     Final   Value: NO GROWTH     [facility]formed at [facility]   Report Status 10/08/2013 FINAL   Final  CULTURE, BLOOD (ROUTINE X 2)     Status: None   Collection Time    10/07/13  5:08 PM      Result Value Ref Range Status   Specimen  Description BLOOD RIGHT ARM   Final   Special Requests BOTTLES DRAWN AEROBIC AND ANAEROBIC 10CC   Final   Culture  Setup Time     Final   Value: 10/07/2013 23:30     [facility]formed at [facility]   Culture     Final   Value: NO GROWTH 5 DAYS     Performed at [facility]   Report Status 10/13/2013 FINAL   Final  MRSA PCR SCREENING     Status: Abnormal   Collection Time    10/07/13  5:09 PM      Result Value Ref Range Status   MRSA by PCR POSITIVE (*) NEGATIVE Final   Comment:            The GeneXpert MRSA Assay (FDA     approved for NASAL specimens     only), is one component of a     comprehensive MRSA colonization     surveillance program. It is not  intended to diagnose MRSA     infection nor to guide or     monitor treatment for     MRSA infections.     RESULT CALLED TO, READ BACK BY AND VERIFIED WITH:     A.BRICKHOUSE,RN [id number] 10/07/13 M.CAMPBELL  CULTURE, BLOOD (ROUTINE X 2)     Status: None   Collection Time    10/07/13  5:14 PM      Result Value Ref Range Status   Specimen Description BLOOD LEFT ARM   Final   Special Requests     Final   Value: BOTTLES DRAWN AEROBIC AND ANAEROBIC AER 6CC,4CC ANA   Culture  Setup Time     Final   Value: 10/07/2013 23:27     Performed at [facility]   Culture     Final   Value: NO GROWTH 5 DAYS     Performed at [facility]   Report Status 10/13/2013 FINAL   Final  BODY FLUID CULTURE     Status: None   Collection Time    10/12/13 11:54 AM      Result Value Ref Range Status   Specimen Description PLEURAL FLUID   Final   Special Requests NONE   Final   Gram Stain     Final   Value: FEW WBC PRESENT, PREDOMINANTLY PMN     NO ORGANISMS SEEN     Performed at [facility]   Culture     Final   Value: NO GROWTH 3 DAYS     Performed at [facility]   Report Status 10/16/2013 FINAL   Final          Studies: Dg Chest 1 View  10/15/2013   CLINICAL DATA:  Right thoracentesis  today, 2 L removed  EXAM: CHEST - 1 VIEW  COMPARISON:  10/13/2013  FINDINGS: Large right effusion and collapse of the right lung with complete opacification right hemi thorax. No pneumothorax.  Left lung remains clear.  IMPRESSION: Complete opacification right hemithorax due to fluid and collapse. No pneumothorax post right thoracentesis.   Electronically Signed   By: Marlan Palauharles  Clark M.D.   On: 10/15/2013 11:29   [place] Thoracentesis Asp Pleural Space W/img Guide  10/15/2013   CLINICAL DATA:  Right pleural effusion  EXAM: ULTRASOUND GUIDED right THORACENTESIS  COMPARISON:  Previous right thoracentesis  PROCEDURE: An ultrasound guided thoracentesis was thoroughly discussed with the patient and questions answered. The benefits, risks, alternatives and complications were also discussed. The patient understands and wishes to proceed with the procedure. Written consent was obtained.  Ultrasound was performed to localize and mark an adequate pocket of fluid in the row chest. The area was then prepped and draped in the normal sterile fashion. 1% Lidocaine was used for local anesthesia. Under ultrasound guidance a 19 gauge Yueh catheter was introduced. Thoracentesis was performed. The catheter was removed and a dressing applied.  Complications:  None  FINDINGS: A total of approximately 2 L of orange reddish fluid was removed. A fluid sample was notsent for laboratory analysis.  IMPRESSION: Successful ultrasound guided right thoracentesis yielding 2 L of pleural fluid.  Read by:  Robet Le[facility]Pamela A [facility]pin [facility]   Electronically Signed   By: Gilmer MorJaime  Wagner D.O.   On: 10/15/2013 13:51        Scheduled Meds: . Chlorhexidine Gluconate Cloth  6 each Topical Q0600  . clonazePAM  0.5 mg Oral QHS  . fentaNYL  25 mcg Transdermal Q72H  .  folic acid  1 mg Oral Daily  . lactulose  30 g Oral TID  . mupirocin ointment  1 application Nasal BID  . rifaximin  550 mg Oral Q12H  . sodium chloride  3 mL Intravenous Q12H  . sodium chloride  1  g Oral TID WC  . spironolactone  50 mg Oral BID  . tamsulosin  0.4 mg Oral Daily  . thiamine  100 mg Oral Daily   Continuous Infusions:   Principal Problem:   Recurrent right pleural effusion Active Problems:   Hyponatremia   possible encephalopathy, hepatic   Ascites due to alcoholic cirrhosis   BPH (benign prostatic hyperplasia)   Liver cirrhosis   Alcohol abuse, in remission   Hepatitis C   Acute respiratory failure with hypoxia    Time spent: 20 mins    Alesi Zachery, MD, [facility] Pager [phone]  If 7PM-7AM, please contact night-coverage [email or web address] Password [email or web address] 10/16/2013, 2:39 PM    LOS: 5 days

## 2013-10-17 ENCOUNTER — Inpatient Hospital Stay (HOSPITAL_COMMUNITY): Payer: Medicaid - Out of State

## 2013-10-17 LAB — BASIC METABOLIC PANEL
Anion gap: 6 (ref 5–15)
Anion gap: 8 (ref 5–15)
BUN: 26 mg/dL — ABNORMAL HIGH (ref 6–23)
BUN: 26 mg/dL — AB (ref 6–23)
CALCIUM: 8.5 mg/dL (ref 8.4–10.5)
CO2: 25 meq/L (ref 19–32)
CO2: 27 mEq/L (ref 19–32)
CREATININE: 0.87 mg/dL (ref 0.50–1.35)
Calcium: 7.9 mg/dL — ABNORMAL LOW (ref 8.4–10.5)
Chloride: 88 mEq/L — ABNORMAL LOW (ref 96–112)
Chloride: 85 mEq/L — ABNORMAL LOW (ref 96–112)
Creatinine, Ser: 0.85 mg/dL (ref 0.50–1.35)
GFR calc Af Amer: >90 mL/min (ref >90)
GFR calc non Af Amer: >90 mL/min (ref >90)
GLUCOSE: 89 mg/dL (ref 70–99)
Glucose, Bld: 73 mg/dL (ref 70–99)
POTASSIUM: 5.1 meq/L (ref 3.7–5.3)
Potassium: 6.1 mEq/L — ABNORMAL HIGH (ref 3.7–5.3)
SODIUM: 119 meq/L — AB (ref 137–147)
Sodium: 120 mEq/L — CL (ref 137–147)

## 2013-10-17 LAB — GLUCOSE, CAPILLARY
GLUCOSE-CAPILLARY: 94 mg/dL (ref 70–99)
Glucose-Capillary: 99 mg/dL (ref 70–99)
Glucose-Capillary: 83 mg/dL (ref 70–99)
Glucose-Capillary: 103 mg/dL — ABNORMAL HIGH (ref 70–99)

## 2013-10-17 LAB — SODIUM, URINE, RANDOM: Sodium, Ur: 69 mEq/L

## 2013-10-17 LAB — CREATININE, URINE, RANDOM: Creatinine, Urine: 55.27 mg/dL

## 2013-10-17 LAB — OSMOLALITY, URINE: Osmolality, Ur: 427 mOsm/kg (ref 390–1090)

## 2013-10-17 LAB — TSH: TSH: 5.07 u[IU]/mL — ABNORMAL HIGH (ref 0.350–4.500)

## 2013-10-17 MED ORDER — FUROSEMIDE 10 MG/ML IJ SOLN
40.0000 mg | Freq: Two times a day (BID) | INTRAMUSCULAR | Status: DC
  Administered 2013-10-17 – 2013-10-19 (×5): 40 mg via INTRAVENOUS
  Filled 2013-10-17 (×7): qty 4

## 2013-10-17 MED ORDER — SODIUM POLYSTYRENE SULFONATE 15 GM/60ML PO SUSP
30.0000 g | Freq: Once | ORAL | Status: AC
  Administered 2013-10-17: 30 g via ORAL
  Filled 2013-10-17: qty 120

## 2013-10-17 MED ORDER — SODIUM CHLORIDE 0.9 % IV SOLN
1.0000 g | Freq: Once | INTRAVENOUS | Status: AC
  Administered 2013-10-17: 1 g via INTRAVENOUS
  Filled 2013-10-17: qty 10

## 2013-10-17 MED ORDER — IOHEXOL 300 MG/ML  SOLN
80.0000 mL | Freq: Once | INTRAMUSCULAR | Status: AC | PRN
  Administered 2013-10-17: 80 mL via INTRAVENOUS

## 2013-10-17 MED ORDER — MORPHINE SULFATE 2 MG/ML IJ SOLN
1.0000 mg | INTRAMUSCULAR | Status: DC | PRN
  Administered 2013-10-17 – 2013-10-24 (×24): 1 mg via INTRAVENOUS
  Filled 2013-10-17 (×28): qty 1

## 2013-10-17 NOTE — Consult Note (Signed)
Reason for Consult:Hyponatremia Referring Physician: Blake Divine, MD  Xavier Valentine is an 53 y.o. male.  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  He returned on 10/11/13 with chest discomfort and was noted to have recurrence of his pleural effusion and hyponatremia (both are chronic).  We were asked to see the patient due to worsening of his hyponatremia.  The trend in Serum sodium is shown below.  Of note, he had also been on aldactone and was placed on sodium chloride tabs but without a fluid restriction.  His weight has increased by almost 1kg since admission despite 2 thoracenteses of over 1 liter each.  Trend in Serum Sodium:  Sodium  Date/Time Value Ref Range Status  10/17/2013  4:27 AM 119* 137 - 147 mEq/L Final  10/16/2013 11:45 AM 119* 137 - 147 mEq/L Final  10/15/2013  5:00 AM 121* 137 - 147 mEq/L Final  10/14/2013  5:51 AM 125* 137 - 147 mEq/L Final  10/13/2013  5:48 AM 125* 137 - 147 mEq/L Final  10/12/2013  8:45 AM 121* 137 - 147 mEq/L Final  10/11/2013  1:00 PM 123* 137 - 147 mEq/L Final  10/10/2013  4:32 AM 126* 137 - 147 mEq/L Final  10/09/2013 10:00 AM 125* 137 - 147 mEq/L Final  10/08/2013  7:04 AM 127* 137 - 147 mEq/L Final  10/07/2013 10:29 AM 125* 137 - 147 mEq/L Final  10/06/2013  2:20 AM 129* 137 - 147 mEq/L Final  10/01/2013  7:01 AM 126* 137 - 147 mEq/L Final  09/30/2013  7:10 AM 123* 137 - 147 mEq/L Final  09/29/2013  7:55 PM 119* 137 - 147 mEq/L Final  09/29/2013  1:01 PM 123* 137 - 147 mEq/L Final  09/29/2013  9:06 AM 120* 137 - 147 mEq/L Final  09/29/2013  6:55 AM 119* 137 - 147 mEq/L Final  09/28/2013  6:26 AM 127* 137 - 147 mEq/L Final  09/27/2013  5:21 AM 129* 137 - 147 mEq/L Final  09/23/2013  8:25 AM 134* 137 - 147 mEq/L Final  09/22/2013 11:39 AM 137  137 - 147 mEq/L Final  09/21/2013  8:10 AM 137  137 - 147 mEq/L Final   09/12/2013  6:29 PM 133* 137 - 147 mEq/L Final  09/10/2013  5:00 AM 128* 137 - 147 mEq/L Final  09/09/2013  4:31 AM 134* 137 - 147 mEq/L Final  09/08/2013  4:36 AM 135* 137 - 147 mEq/L Final  09/07/2013  5:20 AM 134* 137 - 147 mEq/L Final  09/06/2013  4:48 AM 133* 137 - 147 mEq/L Final  09/05/2013  3:35 AM 131* 137 - 147 mEq/L Final  09/04/2013  3:33 AM 133* 137 - 147 mEq/L Final  09/03/2013  2:45 AM 135* 137 - 147 mEq/L Final  09/02/2013  3:33 AM 132* 137 - 147 mEq/L Final  09/01/2013  3:49 AM 130* 137 - 147 mEq/L Final  08/31/2013  3:34 AM 127* 137 - 147 mEq/L Final  08/30/2013  7:45 AM 127* 137 - 147 mEq/L Final  08/29/2013  4:20 AM 127* 137 - 147 mEq/L Final  08/28/2013  1:57 PM 127* 137 - 147 mEq/L Final   PMH:   Past Medical History  Diagnosis Date  . Cirrhosis 08/2013    ETOH and Hep C  . Ascites 09/2013  . Thrombocytopenia 09/2013  . Coagulopathy 09/2013  due to hepatic dysfunction.   . Pneumonia 09/2013  . Chronic alcoholism   . Hepatitis C dx'd ~ 2000  . Anxiety   . Inguinal hernia     repaired in [place] 08/2013    PSH:   Past Surgical History  Procedure Laterality Date  . Incision and drainage perirectal abscess  1980's  . Inguinal hernia repair Right 08/2013    [facility] [place]     Allergies:  Allergies  Allergen Reactions  . Bee Venom Swelling    Medications:   Prior to Admission medications   Medication Sig Start Date End Date Taking? Authorizing Provider  albuterol (PROVENTIL HFA;VENTOLIN HFA) 108 (90 BASE) MCG/ACT inhaler Inhale 2 puffs into the lungs daily as needed for wheezing or shortness of breath.   Yes Historical Provider, MD  clonazePAM (KLONOPIN) 0.5 MG tablet Take 1 tablet (0.5 mg total) by mouth daily as needed for anxiety. 10/09/13  Yes Leroy SeaPrashant K Singh, MD  docusate sodium (COLACE) 100 MG capsule Take 1 capsule (100 mg total) by mouth 2 (two) times daily as needed for mild constipation. Hold if you have diarrhea. 10/01/13  Yes Tora KindredMarianne L York, PA-C   folic acid (FOLVITE) 1 MG tablet Take 1 tablet (1 mg total) by mouth daily. 09/09/13  Yes Clydia LlanoMutaz Elmahi, MD  furosemide (LASIX) 40 MG tablet Take 2 tablets (80 mg total) by mouth 2 (two) times daily. 10/10/13  Yes Leroy SeaPrashant K Singh, MD  lactulose (CHRONULAC) 10 GM/15ML solution Take 45 mLs (30 g total) by mouth daily. 10/01/13  Yes Marianne L York, PA-C  Multiple Vitamin (MULTIVITAMIN WITH MINERALS) TABS tablet Take 1 tablet by mouth daily. 09/09/13  Yes Clydia LlanoMutaz Elmahi, MD  oxyCODONE (ROXICODONE) 5 MG immediate release tablet Take 1 tablet (5 mg total) by mouth every 4 (four) hours as needed for severe pain. 10/09/13  Yes Leroy SeaPrashant K Singh, MD  spironolactone (ALDACTONE) 100 MG tablet Take 1 tablet (100 mg total) by mouth 2 (two) times daily. 10/10/13  Yes Leroy SeaPrashant K Singh, MD  tamsulosin (FLOMAX) 0.4 MG CAPS capsule Take 1 capsule (0.4 mg total) by mouth daily. 09/23/13  Yes Tora KindredMarianne L York, PA-C  thiamine 100 MG tablet Take 1 tablet (100 mg total) by mouth daily. 09/09/13  Yes Clydia LlanoMutaz Elmahi, MD    Discontinued Meds:   Medications Discontinued During This Encounter  Medication Reason  . clonazePAM (KLONOPIN) tablet 0.5 mg   . HYDROmorphone (DILAUDID) injection 0.5 mg   . 0.9 %  sodium chloride infusion   . spironolactone (ALDACTONE) tablet 100 mg   . morphine 2 MG/ML injection 2 mg   . furosemide (LASIX) injection 40 mg   . morphine 2 MG/ML injection 0.5 mg   . spironolactone (ALDACTONE) tablet 50 mg   . sodium chloride tablet 1 g     Social History:  reports that he has quit smoking. His smoking use included Cigarettes. He smoked 0.00 packs per day for 10 years. He has never used smokeless tobacco. He reports that he drinks alcohol. He reports that he uses illicit drugs (Marijuana and Cocaine).  Family History:   Family History  Problem Relation Age of Onset  . CAD    . Hypertension    . Diabetes Mother   . Hypertension Mother   . Cancer Father   . Cancer Maternal Grandfather     Pertinent  items are noted in HPI.  Blood pressure 116/66, pulse 73, temperature 97.7 F (36.5 C), temperature source Oral, resp. rate  18, height 5\' 11"  (1.803 m), weight 82.872 kg (182 lb 11.2 oz), SpO2 96.00%. General appearance: alert, cooperative and no distress Head: Normocephalic, without obvious abnormality, atraumatic Eyes: negative findings: lids and lashes normal, conjunctivae and sclerae normal and corneas clear Neck: no adenopathy, no carotid bruit, supple, symmetrical, trachea midline and thyroid not enlarged, symmetric, no tenderness/mass/nodules Resp: diminished breath sounds bibasilar Cardio: regular rate and rhythm, S1, S2 normal, no murmur, click, rub or gallop GI: distended, +BS, soft, +fluid wave Extremities: edema 1+  Labs: Basic Metabolic Panel:  Recent Labs Lab 10/11/13 1300 10/12/13 0845 10/13/13 0548 10/14/13 0551 10/15/13 0500 10/16/13 1145 10/17/13 0427  NA 123* 121* 125* 125* 121* 119* 119*  K 4.3 4.5 5.0 4.7 5.2 5.2 6.1*  CL 89* 86* 89* 89* 87* 87* 88*  CO2 24 26 26 26 26 26 25   GLUCOSE 123* 125* 86 67* 87 97 89  BUN 32* 36* 33* 30* 30* 25* 26*  CREATININE 0.88 0.89 0.92 0.92 0.97 0.80 0.85  ALBUMIN 1.8* 2.1* 2.1* 1.9*  --   --   --   CALCIUM 8.1* 8.8 8.6 8.1* 8.1* 7.7* 7.9*   Liver Function Tests:  Recent Labs Lab 10/12/13 0845 10/13/13 0548 10/14/13 0551  AST 42* 44* 45*  ALT 31 33 31  ALKPHOS 96 99 112  BILITOT 4.1* 4.3* 2.9*  PROT 7.0 7.1 7.2  ALBUMIN 2.1* 2.1* 1.9*   No results found for this basename: LIPASE, AMYLASE,  in the last 168 hours  Recent Labs Lab 10/11/13 2020  AMMONIA 24   CBC:  Recent Labs Lab 10/11/13 1300 10/12/13 0845 10/14/13 0551  WBC 14.2* 13.8* 11.1*  HGB 12.7* 12.1* 12.5*  HCT 35.9* 34.1* 35.9*  MCV 89.5 90.2 91.8  PLT 132* 141* 142*   PT/INR: @labrcntip (inr:5) Cardiac Enzymes: No results found for this basename: CKTOTAL, CKMB, CKMBINDEX, TROPONINI,  in the last 168 hours CBG:  Recent Labs Lab  10/16/13 1114 10/16/13 1624 10/16/13 2157 10/17/13 0439 10/17/13 1125  GLUCAP 127* 89 108* 94 99    Iron Studies: No results found for this basename: IRON, TIBC, TRANSFERRIN, FERRITIN,  in the last 168 hours  Xrays/Other Studies: Dg Chest 2 View  10/16/2013   CLINICAL DATA:  Recurrent effusion.  Evaluate for left effusion.  EXAM: CHEST  2 VIEW  COMPARISON:  10/15/2013.  FINDINGS: Mediastinum and hilar structures normal. Heart size normal. Previous identified complete opacification the right hemithorax has partially cleared. A large right pleural effusions present. Underlying infiltrate and/or atelectasis cannot be excluded. No significant left pleural effusion. No acute bony abnormality.  IMPRESSION: 1. Interim partial clearing of right pleural effusion. Large right effusion remains. Underlying right lower lobe atelectasis and/or consolidation may be present. 2. No evidence of left-sided pleural effusion.   Electronically Signed   By: Maisie Fushomas  Register   On: 10/16/2013 15:07     Assessment/Plan: 1. Hyponatremia- in setting of cirrhosis/ascites/anasarca. 1. Fluid restrict to 1 liter/day 2. Change po lasix to IV lasix 3. Stop aldactone 4. Stop NaCl tabs 5. Consider samsca if not responding to lasix 6. Increase protein intake   2. Hyperkalemia- discontinued aldactone 1. IV lasix and repeat 3. Cirrhosis- only stopped drinking 60 days ago.  Not eligible for transplant for at least 6 months 1. Cont with lactulose 4. Ascites/anasarca- considering [place] shunt per GI and IR.   Mariela Rex A 10/17/2013, 12:16 PM

## 2013-10-17 NOTE — Progress Notes (Signed)
Daily Rounding Note  10/17/2013, 8:26 AM  LOS: 6 days   SUBJECTIVE:   Complains of chest wall/rib pain triggered by cough.  Not as dyspneic as he was a few days ago.  Small stool this AM.  No abdominal pain. Urinating ok.   OBJECTIVE:         Vital signs in last 24 hours:    Temp:  [97.7 F (36.5 C)-98.5 F (36.9 C)] 97.7 F (36.5 C) (10/16 0507) Pulse Rate:  [73-81] 73 (10/16 0507) Resp:  [17-18] 18 (10/16 0507) BP: (108-126)/(58-66) 116/66 mmHg (10/16 0507) SpO2:  [96 %-100 %] 96 % (10/16 0507) Weight:  [82.872 kg (182 lb 11.2 oz)] 82.872 kg (182 lb 11.2 oz) (10/16 0502) Last BM Date: 10/16/13 Filed Weights   10/15/13 0450 10/16/13 0444 10/17/13 0502  Weight: 82.781 kg (182 lb 8 oz) 82.192 kg (181 lb 3.2 oz) 82.872 kg (182 lb 11.2 oz)   General: jaundiced, ill appearing.    Heart: RRR Chest: absent BS on left.  Abdomen: protuberant, soft, NT  Extremities: 2 to 3+ LE edema.  Neuro/Psych:  Pleasant, talking non-stop, do not feel he is hearing what people.  No asterixis.  Oriented x 3.    Intake/Output from previous day: 10/15 0701 - 10/16 0700 In: 960 [P.O.:960] Out: 1275 [Urine:1275]  Intake/Output this shift:    Lab Results: No results found for this basename: WBC, HGB, HCT, PLT,  in the last 72 hours BMET  Recent Labs  10/15/13 0500 10/16/13 1145 10/17/13 0427  NA 121* 119* 119*  K 5.2 5.2 6.1*  CL 87* 87* 88*  CO2 26 26 25   GLUCOSE 87 97 89  BUN 30* 25* 26*  CREATININE 0.97 0.80 0.85  CALCIUM 8.1* 7.7* 7.9*   LFT No results found for this basename: PROT, ALBUMIN, AST, ALT, ALKPHOS, BILITOT, BILIDIR, IBILI,  in the last 72 hours PT/INR No results found for this basename: LABPROT, INR,  in the last 72 hours  Studies/Results: Dg Chest 2 View 10/16/2013   .  FINDINGS: Mediastinum and hilar structures normal. Heart size normal. Previous identified complete opacification the right hemithorax  has partially cleared. A large right pleural effusions present. Underlying infiltrate and/or atelectasis cannot be excluded. No significant left pleural effusion. No acute bony abnormality.  IMPRESSION: 1. Interim partial clearing of right pleural effusion. Large right effusion remains. Underlying right lower lobe atelectasis and/or consolidation may be present. 2. No evidence of left-sided pleural effusion.   Electronically Signed   By: Maisie Fushomas  Register   On: 10/16/2013 15:07   Scheduled Meds: . clonazePAM  0.5 mg Oral QHS  . fentaNYL  25 mcg Transdermal Q72H  . folic acid  1 mg Oral Daily  . lactulose  30 g Oral TID  . mupirocin ointment  1 application Nasal BID  . rifaximin  550 mg Oral Q12H  . sodium chloride  3 mL Intravenous Q12H  . sodium chloride  1 g Oral TID WC  . spironolactone  50 mg Oral BID  . tamsulosin  0.4 mg Oral Daily  . thiamine  100 mg Oral Daily   Continuous Infusions:  PRN Meds:.albuterol, docusate sodium, guaiFENesin-dextromethorphan, morphine injection, ondansetron (ZOFRAN) IV, ondansetron, oxyCODONE, polyethylene glycol   ASSESMENT:   * Late stage cirrhosis from hep C and ETOH. As of 10/6 (last check of coags) Child's-Pugh score was 13, MELD was 40.  * Hepatic hydrothorax. S/p multiple thoracentesis but fluid rapidly  reaccumulates. Diuretic therapy constrained by hyponatremia. On 100 aldactone daily and no furosemide  *  Rib fractures: continues to have cough induced chest wall pain requiring narcotics.   *  Hyponatremia.  Dr Blake DivineAkula added tid oral sodium 10/15.  On 1200 fluid restricted diet.   May want to have renal weigh in on mgt of this problem.    * Ascites. No SBP on 9/20 3.6 liter paracentesis.  10/7 abdominal ultrasousnd: not enough ascites to tap.  Looks as though he may have acculmulated    * Coagulopathy.   * Hepatic encephalopathy. On lactulose at home but ? Compliance, now this is a prn med.  Started Rifaximin 10/10  MS improved after multiple  BMs 10/14. He continues to be oriented x 3 but he is still encephalopathic (vs underlying mental health disorder) and not sure he grasps all that providers are telling him.   * Alcoholism and drug abuse. Claims abstinence since late 08/2013. No tox screens since 08/29/13.     PLAN   *  TIPS vs VP shunt.  Consider this for mgt of hepatic hydrothorax.  Called consult to IR. *  Ordered Hep C quant and genotype in AM.  Has not been done here in [place].   *  Stop telemetry, pt is DNR and has no hx heart problems.  *  Consider renal consult for the low sodium.    Jennye MoccasinSarah Gribbin  10/17/2013, 8:26 AM Pager: [phone]  I do not think medical therapy will be successful in controlling hydrothorax or ascites.  In view of his history of encephalopathy I am leaning towards doing a VP shunt vs TIPS procedure.  Will discuss with IR after they have assessed the patient.

## 2013-10-17 NOTE — Consult Note (Signed)
Agree with above note. Long discussion with a risk/benefit analysis was held with the patient and with the patient's sister, the POA Theda Sers(Lisa Pannell.  [phone]). Impression: I believe the patient is at risk for progressive hepatic encephalopathy with a TIPS shunt, thus is not a good candidate.  He may receive benefit from a [place] Shunt.  We will have Ms Annamarie Majorawn Drazek NP, a specialist with liver transplant, initiate a conversation regarding options.  Also appreciate her input regarding maintaining candidacy.   Possible elective [place] shunt next week.    Thank you very much for the consult and the opportunity to participate in this patient's care.   Xavier MorJaime Alyia Lacerte, DO [facility]

## 2013-10-17 NOTE — Progress Notes (Signed)
Physical Therapy Treatment Patient Details Name: Wyvonnia DuskyRichard L Sheu MRN: [id number] DOB: 1960-03-16 Today's Date: 10/17/2013    History of Present Illness      PT Comments    Pt continues to demo decreased safety awareness and impulsivity during gait.  Continue per POC.  Follow Up Recommendations  Home health PT;Supervision/Assistance - 24 hour     Equipment Recommendations  None recommended by PT    Recommendations for Other Services       Precautions / Restrictions Precautions Precautions: Fall Precaution Comments: watch sats    Mobility  Bed Mobility               General bed mobility comments: Pt up in recliner.  Transfers Overall transfer level: Modified independent Equipment used: None   Sit to Stand: Modified independent (Device/Increase time) Stand pivot transfers: Supervision          Ambulation/Gait Ambulation/Gait assistance: Supervision Ambulation Distance (Feet): 300 Feet Assistive device: None Gait Pattern/deviations: Step-through pattern;Narrow base of support;Drifts right/left Gait velocity: fast   General Gait Details: Pt very impulsive with decreased safety awareness during gait.  Encouragement needed for pt to agree to port O2.  Sats were 85% after mobility in room on RA.  O2 at 2 L/min used during gait.  SaO2 90% upon return to room.  Pt placed on O2 in room.   Stairs            Wheelchair Mobility    Modified Rankin (Stroke Patients Only)       Balance     Sitting balance-Leahy Scale: Good     Standing balance support: No upper extremity supported Standing balance-Leahy Scale: Fair                      Cognition Arousal/Alertness: Awake/alert Behavior During Therapy: Restless Overall Cognitive Status: History of cognitive impairments - at baseline Area of Impairment: Safety/judgement     Memory: Decreased recall of precautions   Safety/Judgement: Decreased awareness of deficits     General  Comments: Pt very talkative and brushes off any safety concerns.    Exercises      General Comments        Pertinent Vitals/Pain Pain Assessment: 0-10 Pain Score: 7  Pain Location: ribs bilat Pain Intervention(s): RN gave pain meds during session;Monitored during session    Home Living                      Prior Function            PT Goals (current goals can now be found in the care plan section) Progress towards PT goals: Progressing toward goals    Frequency  Min 3X/week    PT Plan Current plan remains appropriate    Co-evaluation             End of Session Equipment Utilized During Treatment: Gait belt;Oxygen Activity Tolerance: Patient limited by fatigue Patient left: in chair;with call bell/phone within reach     Time: 1110-1139 PT Time Calculation (min): 29 min  Charges:  $Gait Training: 23-37 mins                    G Codes:      Ilda FoilGarrow, Lupita Rosales Rene 10/17/2013, 12:09 PM

## 2013-10-17 NOTE — Progress Notes (Signed)
10/17/2013 CRITICAL VALUE ALERT  Critical value received:  NA 120   Date of notification:  10/17/2013   Time of notification:  1520 Critical value read back: YES  Nurse who received alert:  Athenia Rys, Blanchard KelchStephanie Ingold   MD notified (1st page):  AKULA   Time of first page:  ON FLOOR AND MADE AWARE. NO NEW ORDERS   MD notified (2nd page):  Time of second page:  Responding MD:  Blake DivineAKULA   Time MD responded:  1525

## 2013-10-17 NOTE — Progress Notes (Signed)
Pt came to CT to have a chest scan done. Pt was very vocal in expressing his frustration over what he termed "lack of care". Pt feels that he is being taken care of by "incompetent" staff who have no idea what they're doing"; (mainly physicians). Pt states that he is being seen by PAs so "he can be a [place]inea pig" Pt is  very frustrated because his pain is not being completely taken away. Pt states that he has numerous broken ribs that move every time he does, and "nobody seems to care".

## 2013-10-17 NOTE — Progress Notes (Signed)
Progress Note from the Palliative Medicine Team at [facility]  Subjective:   -patient is alert and oriented presetnly, with intermittent confusion and  little insight to his situation   -detailed conversation with Misty StanleyLisa his sister/HPOA-family is open to all available and offered medical interventions hoping for improvement and stabilization    Objective: Allergies  Allergen Reactions  . Bee Venom Swelling   Scheduled Meds: . calcium gluconate  1 g Intravenous Once  . clonazePAM  0.5 mg Oral QHS  . fentaNYL  25 mcg Transdermal Q72H  . folic acid  1 mg Oral Daily  . furosemide  40 mg Intravenous BID  . lactulose  30 g Oral TID  . mupirocin ointment  1 application Nasal BID  . rifaximin  550 mg Oral Q12H  . sodium chloride  3 mL Intravenous Q12H  . sodium chloride  1 g Oral TID WC  . tamsulosin  0.4 mg Oral Daily  . thiamine  100 mg Oral Daily   Continuous Infusions:  PRN Meds:.albuterol, docusate sodium, guaiFENesin-dextromethorphan, morphine injection, ondansetron (ZOFRAN) IV, ondansetron, oxyCODONE, polyethylene glycol  BP 116/66  Pulse 73  Temp(Src) 97.7 F (36.5 C) (Oral)  Resp 18  Ht 5\' 11"  (1.803 m)  Wt 82.872 kg (182 lb 11.2 oz)  BMI 25.49 kg/m2  SpO2 96%   PPS:50 %  Pain Score:denies presetnly Pain Location left    Intake/Output Summary (Last 24 hours) at 10/17/13 1156 Last data filed at 10/17/13 1139  Gross per 24 hour  Intake    840 ml  Output   1625 ml  Net   -785 ml       Physical Exam:  General: chronically ill appearing  HEENT: + temporal muscle wasting, juandice sclera  Chest: Decreased in bases,  CVS: RRR  Abdomen:distended, tympanic, distant+ BS  Ext: BLE +3 edema  Neuro: Alert and oriented X3  Pstch: limited insight into his situation   Labs: CBC    Component Value Date/Time   WBC 11.1* 10/14/2013 0551   RBC 3.91* 10/14/2013 0551   HGB 12.5* 10/14/2013 0551   HCT 35.9* 10/14/2013 0551   PLT 142* 10/14/2013 0551   MCV 91.8  10/14/2013 0551   MCH 32.0 10/14/2013 0551   MCHC 34.8 10/14/2013 0551   RDW 15.2 10/14/2013 0551   LYMPHSABS 2.1 10/06/2013 0220   MONOABS 0.8 10/06/2013 0220   EOSABS 0.3 10/06/2013 0220   BASOSABS 0.0 10/06/2013 0220    BMET    Component Value Date/Time   NA 119* 10/17/2013 0427   K 6.1* 10/17/2013 0427   CL 88* 10/17/2013 0427   CO2 25 10/17/2013 0427   GLUCOSE 89 10/17/2013 0427   BUN 26* 10/17/2013 0427   CREATININE 0.85 10/17/2013 0427   CALCIUM 7.9* 10/17/2013 0427   GFRNONAA >90 10/17/2013 0427   GFRAA >90 10/17/2013 0427    CMP     Component Value Date/Time   NA 119* 10/17/2013 0427   K 6.1* 10/17/2013 0427   CL 88* 10/17/2013 0427   CO2 25 10/17/2013 0427   GLUCOSE 89 10/17/2013 0427   BUN 26* 10/17/2013 0427   CREATININE 0.85 10/17/2013 0427   CALCIUM 7.9* 10/17/2013 0427   PROT 7.2 10/14/2013 0551   ALBUMIN 1.9* 10/14/2013 0551   AST 45* 10/14/2013 0551   ALT 31 10/14/2013 0551   ALKPHOS 112 10/14/2013 0551   BILITOT 2.9* 10/14/2013 0551   GFRNONAA >90 10/17/2013 0427   GFRAA >90 10/17/2013 [id number]  Assessment and Plan: 1. Code Status:  DNR/DNI 2. Symptom Control:       - fentanyl patch 25 mcg/Morphine 1 mg IV every 4 hrs prn/Oxycodoen mg po every 6hrs prn 3. Psycho/Social:  Emotional support offered to patient and family.  This is a difficult situation for all.  Family is supportive. 4. Spiritual:  Declined spiritual care consult 5. Disposition:  Likely home to live with sister Misty StanleyLisa.  (patient lives in an RV in her driveway)  Patient Documents Completed or Given: Document Given Completed  Advanced Directives Pkt    MOST X   DNR  X  Gone from My Sight    Hard Choices      Time In Time Out Total Time Spent with Patient Total Overall Time  1125 1200 35 min 35 min    Greater than 50%  of this time was spent counseling and coordinating care related to the above assessment and plan.  Lorinda CreedMary Yasmeen Manka NP  Palliative Medicine Team Team Phone #  [phone] Pager [phone]  Discussed with Dr Blake DivineAkula 1

## 2013-10-17 NOTE — Consult Note (Signed)
Chief Complaint: Chief Complaint  Patient presents with  . Ascites  . Pain  Alcoholic cirrhosis Hepatitis C Recurrent ascites Hepatic hydrothorax  Referring Physician(s):  Dr Arlyce Dice  History of Present Illness: Xavier Valentine is a 53 y.o. male  Pt with R hepatic hydrothorax with 2 thoracentesis just this week Mon: 1.5 liters Wed: 2 liters Does feel some better Mild encephalopathy  Abdomen with definite distension Dr Arlyce Dice has asked IR to consult/evaluate for poss TIPS vs [place] shunt placement MELD 18 Dr Loreta Ave has reviewed imaging; chart and seen and examined pt. He has discussed with Dr Arlyce Dice And he has discussed options with Niece in room See note of Dr Loreta Ave  I have and examined pt Will await further discussion per Dr Loreta Ave with Dr Arlyce Dice as to plan    Past Medical History  Diagnosis Date  . Cirrhosis 08/2013    ETOH and Hep C  . Ascites 09/2013  . Thrombocytopenia 09/2013  . Coagulopathy 09/2013    due to hepatic dysfunction.   . Pneumonia 09/2013  . Chronic alcoholism   . Hepatitis C dx'd ~ 2000  . Anxiety   . Inguinal hernia     repaired in [place] 08/2013    Past Surgical History  Procedure Laterality Date  . Incision and drainage perirectal abscess  1980's  . Inguinal hernia repair Right 08/2013    at [facility] in [place].     Allergies: Bee venom  Medications: Prior to Admission medications   Medication Sig Start Date End Date Taking? Authorizing Provider  albuterol (PROVENTIL HFA;VENTOLIN HFA) 108 (90 BASE) MCG/ACT inhaler Inhale 2 puffs into the lungs daily as needed for wheezing or shortness of breath.   Yes Historical Provider, MD  clonazePAM (KLONOPIN) 0.5 MG tablet Take 1 tablet (0.5 mg total) by mouth daily as needed for anxiety. 10/09/13  Yes Leroy Sea, MD  docusate sodium (COLACE) 100 MG capsule Take 1 capsule (100 mg total) by mouth 2 (two) times daily as needed for mild constipation. Hold if you have diarrhea. 10/01/13   Yes Tora Kindred York, PA-C  folic acid (FOLVITE) 1 MG tablet Take 1 tablet (1 mg total) by mouth daily. 09/09/13  Yes Clydia Llano, MD  furosemide (LASIX) 40 MG tablet Take 2 tablets (80 mg total) by mouth 2 (two) times daily. 10/10/13  Yes Leroy Sea, MD  lactulose (CHRONULAC) 10 GM/15ML solution Take 45 mLs (30 g total) by mouth daily. 10/01/13  Yes Marianne L York, PA-C  Multiple Vitamin (MULTIVITAMIN WITH MINERALS) TABS tablet Take 1 tablet by mouth daily. 09/09/13  Yes Clydia Llano, MD  oxyCODONE (ROXICODONE) 5 MG immediate release tablet Take 1 tablet (5 mg total) by mouth every 4 (four) hours as needed for severe pain. 10/09/13  Yes Leroy Sea, MD  spironolactone (ALDACTONE) 100 MG tablet Take 1 tablet (100 mg total) by mouth 2 (two) times daily. 10/10/13  Yes Leroy Sea, MD  tamsulosin (FLOMAX) 0.4 MG CAPS capsule Take 1 capsule (0.4 mg total) by mouth daily. 09/23/13  Yes Tora Kindred York, PA-C  thiamine 100 MG tablet Take 1 tablet (100 mg total) by mouth daily. 09/09/13  Yes Clydia Llano, MD    Family History  Problem Relation Age of Onset  . CAD    . Hypertension    . Diabetes Mother   . Hypertension Mother   . Cancer Father   . Cancer Maternal Grandfather     History   Social  History  . Marital Status: Single    Spouse Name: N/A    Number of Children: N/A  . Years of Education: N/A   Social History Main Topics  . Smoking status: Former Smoker -- 10 years    Types: Cigarettes  . Smokeless tobacco: Never Used     Comment: "smoked 1/2 pack/wk when I did smoke"  . Alcohol Use: Yes     Comment: 10/07/2013 "I've had 2 drinks since 08/2013; I"ve quit"  . Drug Use: 0.20 per week    Special: Marijuana, Cocaine     Comment: 10/07/2013 "last cocaine 08/2013; last marijuana was a couple days ago"  . Sexual Activity: Yes    Birth Control/ Protection: Condom   Other Topics Concern  . None   Social History Narrative  . None    Review of Systems: A 12 point ROS discussed  and pertinent positives are indicated in the HPI above.  All other systems are negative.  Review of Systems  Constitutional: Positive for activity change and fatigue. Negative for fever, diaphoresis and unexpected weight change.  Respiratory: Positive for cough, chest tightness, shortness of breath and wheezing.   Cardiovascular: Positive for leg swelling. Negative for chest pain.  Gastrointestinal: Positive for diarrhea and abdominal distention. Negative for abdominal pain and constipation.  Genitourinary: Negative for difficulty urinating.  Skin: Negative for color change.  Neurological: Negative for dizziness, speech difficulty, numbness and headaches.  Psychiatric/Behavioral: Positive for confusion.    Vital Signs: BP 116/66  Pulse 73  Temp(Src) 97.7 F (36.5 C) (Oral)  Resp 18  Ht 5\' 11"  (1.803 m)  Wt 82.872 kg (182 lb 11.2 oz)  BMI 25.49 kg/m2  SpO2 96%  Physical Exam  Constitutional: He is oriented to person, place, and time.  Thin; ill appearing  Cardiovascular: Normal rate and regular rhythm.   No murmur heard. Pulmonary/Chest: Effort normal. He has wheezes.  Rt sided sounds minimal  Abdominal: Soft. He exhibits distension. There is no tenderness.  Musculoskeletal: Normal range of motion.  Neurological: He is alert and oriented to person, place, and time. Coordination normal.  Skin: Skin is warm and dry.  Psychiatric: He has a normal mood and affect. His behavior is normal. Thought content normal.  Seems appropriate with an occasional thought is is confused; mild encephalopathy    Imaging: Dg Chest 1 View  10/15/2013   CLINICAL DATA:  Right thoracentesis today, 2 L removed  EXAM: CHEST - 1 VIEW  COMPARISON:  10/13/2013  FINDINGS: Large right effusion and collapse of the right lung with complete opacification right hemi thorax. No pneumothorax.  Left lung remains clear.  IMPRESSION: Complete opacification right hemithorax due to fluid and collapse. No pneumothorax  post right thoracentesis.   Electronically Signed   By: Marlan Palau M.D.   On: 10/15/2013 11:29   Dg Chest 1 View  10/13/2013   CLINICAL DATA:  Pleural effusion.  Status post RIGHT thoracentesis.  EXAM: CHEST - 1 VIEW  COMPARISON:  10/13/2013 at 11/1932 priors.  FINDINGS: There is considerable, near complete opacity at the RIGHT hemi thorax despite removal of what is reportedly a large amount of fluid. There is no visible pneumothorax. No osseous lesions.  There may be mild mediastinal shift from LEFT to RIGHT implying a component of this opacity relates to volume loss.  IMPRESSION: No visible pneumothorax following RIGHT thoracentesis. See discussion above.   Electronically Signed   By: Davonna Belling M.D.   On: 10/13/2013 15:30   Dg  Chest 2 View  10/16/2013   CLINICAL DATA:  Recurrent effusion.  Evaluate for left effusion.  EXAM: CHEST  2 VIEW  COMPARISON:  10/15/2013.  FINDINGS: Mediastinum and hilar structures normal. Heart size normal. Previous identified complete opacification the right hemithorax has partially cleared. A large right pleural effusions present. Underlying infiltrate and/or atelectasis cannot be excluded. No significant left pleural effusion. No acute bony abnormality.  IMPRESSION: 1. Interim partial clearing of right pleural effusion. Large right effusion remains. Underlying right lower lobe atelectasis and/or consolidation may be present. 2. No evidence of left-sided pleural effusion.   Electronically Signed   By: Maisie Fus  Register   On: 10/16/2013 15:07   Dg Chest 2 View  10/12/2013   CLINICAL DATA:  Persistent dyspnea  EXAM: CHEST  2 VIEW  COMPARISON:  October 11, 2013  FINDINGS: There remains complete opacification of the right hemithorax, presumably due to effusion. If fusion appears to displace the right main bronchus inferiorly.  The left lung is clear except for some cyst minimal left base atelectatic change. The heart size is normal. Pulmonary vascularity on the left is  normal. There is no demonstrable pneumothorax.  IMPRESSION: Persistent complete opacification of the right hemi thorax, presumably due to effusion. There may well be superimposed consolidation on the right, obscured by the effusion. Left lung is clear except for mild left base atelectatic change.   Electronically Signed   By: Bretta Bang M.D.   On: 10/12/2013 14:59   Dg Chest 2 View  10/07/2013   CLINICAL DATA:  Shortness of breath. Chest pain. Followup right pleural effusion. Heart palpitations and chest tightness approximately 30 min after taking a new unspecified medication. Current history hepatitis-C.  EXAM: CHEST  2 VIEW  COMPARISON:  Two-view chest x-rays yesterday dating back to 08/30/2013.  FINDINGS: Very large right pleural effusion with associated passive atelectasis involving much of the right lung, with aeration of the apex; the effusion has increased in size since yesterday. Left lung remains clear. Cardiac silhouette normal in size, unchanged. Thoracic aorta mildly atherosclerotic, unchanged. No visible left pleural effusion.  IMPRESSION: Interval increase in size of the very large right pleural effusion. Associated dense passive atelectasis involving much of the right lung with only residual aeration of the apex. Left lung remains clear.   Electronically Signed   By: Hulan Saas M.D.   On: 10/07/2013 13:23   Dg Chest 2 View  10/06/2013   CLINICAL DATA:  8 lb of weight gain in the past 2 days. Known large right-sided pleural effusion. Abdominal bloating and constipation, of acute onset.  EXAM: CHEST  2 VIEW  COMPARISON:  Chest radiograph performed 09/29/2013  FINDINGS: A large right-sided pleural effusion is again noted, mildly increased from the prior study, with associated atelectasis. The left lung appears clear. No pneumothorax is seen.  The heart is not well characterized due to the large right-sided pleural effusion. No acute osseous abnormalities are seen.  IMPRESSION: Large  right-sided pleural effusion noted, mildly increased from the prior study, with associated atelectasis.   Electronically Signed   By: Roanna Raider M.D.   On: 10/06/2013 03:22   Dg Chest 2 View  09/29/2013   CLINICAL DATA:  Right pleural effusion  EXAM: CHEST  2 VIEW  COMPARISON:  09/27/2013  FINDINGS: There is a large right pleural effusion unchanged from the prior exam. There is no left pleural effusion. There is no focal parenchymal opacity. There is no pneumothorax. The heart and mediastinal contours are  unremarkable.  The osseous structures are unremarkable.  IMPRESSION: Large right pleural effusion unchanged from 09/27/2013.   Electronically Signed   By: Elige Ko   On: 09/29/2013 13:17   Dg Chest 2 View  09/27/2013   CLINICAL DATA:  Cough, congestion, fever, shortness of breath, right-sided chest pain. Pneumonia last week.  EXAM: CHEST  2 VIEW  COMPARISON:  09/21/2013  FINDINGS: Significant increase in right pleural effusion and basilar atelectasis/ consolidation since previous study. Now also blunting of left costophrenic angle. Normal heart size and pulmonary vascularity. Left lung is clear.  IMPRESSION: Increasing size of right pleural effusion and right basilar atelectasis/ consolidation since previous study.   Electronically Signed   By: Burman Nieves M.D.   On: 09/27/2013 05:11   Dg Ribs Unilateral W/chest Left  10/11/2013   CLINICAL DATA:  Shortness breath. Cough. Ascites. Hepatic insufficiency. Left rib and chest pain.  EXAM: LEFT RIBS AND CHEST - 3+ VIEW  COMPARISON:  10/07/2013  FINDINGS: Nondisplaced acute fractures are seen involving the left posterior lateral seventh and eighth ribs. There is no evidence of pneumothorax.  Large right pleural effusion is further increase in size now resulting complete opacification of the right hemithorax. Left lung remains grossly clear. Heart size is stable.  IMPRESSION: Nondisplaced fractures of the left posterior seventh and eighth ribs. No  evidence of pneumothorax.  Increased size of large right pleural effusion causing complete opacification of right hemithorax.   Electronically Signed   By: Myles Rosenthal M.D.   On: 10/11/2013 14:27   US Abdomen Limited  10/09/2013   CLINICAL DATA:  Ascites, cirrhosis, hepatitis-C, assessment for paracentesis  EXAM: LIMITED ABDOMEN ULTRASOUND FOR ASCITES  TECHNIQUE: Limited ultrasound survey for ascites was performed in all four abdominal quadrants.  COMPARISON:  09/21/2013  FINDINGS: Minimal perihepatic and LEFT lower quadrant ascites identified.  Volume of ascites is insufficient to warrant paracentesis.  IMPRESSION: Insufficient ascites for paracentesis.   Electronically Signed   By: Ulyses Southward M.D.   On: 10/09/2013 14:44   US Paracentesis  09/21/2013   INDICATION: Symptomatic abdominal ascites. History of cirrhosis. Please perform diagnostic and therapeutic paracentesis.  EXAM: ULTRASOUND-GUIDED PARACENTESIS  COMPARISON:  CT the abdomen pelvis - 08/28/2013; pelvic CT -09/08/2013  MEDICATIONS: None.  COMPLICATIONS: None immediate  TECHNIQUE: Informed written consent was obtained from the patient after a discussion of the risks, benefits and alternatives to treatment. A timeout was performed prior to the initiation of the procedure.  Initial ultrasound scanning demonstrates a moderate amount of ascites within the right lower abdominal quadrant. The right lower abdomen was prepped and draped in the usual sterile fashion. 1% lidocaine with epinephrine was used for local anesthesia. Under direct ultrasound guidance, a 19 gauge, 7-cm, Yueh catheter was introduced. An ultrasound image was saved for documentation purposed. The paracentesis was performed. The catheter was removed and a dressing was applied. The patient tolerated the procedure well without immediate post procedural complication.  FINDINGS: A total of approximately 3.6 liters of serous, yellow tinged fluid was removed. Samples were sent to the  laboratory as requested by the clinical team.  IMPRESSION: Successful ultrasound-guided paracentesis yielding 3.6 liters of peritoneal fluid.   Electronically Signed   By: Simonne Come M.D.   On: 09/21/2013 12:24   Dg Chest Portable 1 View  10/13/2013   CLINICAL DATA:  Pleural effusion.  EXAM: PORTABLE CHEST - 1 VIEW  COMPARISON:  10/12/2013 .  FINDINGS: Opacification the right hemithorax again noted. This is consistent  with large pleural effusion. Underlying pulmonary consolidation and/or atelectasis may be present. Left lung is clear. Heart size normal. No acute bony abnormality.  IMPRESSION: Unchanged complete opacification of the right hemithorax, most consistent with large right pleural effusion. Underlying pulmonary consolidation and or atelectasis may be present. Left lung is clear .   Electronically Signed   By: Maisie Fushomas  Register   On: 10/13/2013 12:19   Dg Chest Port 1 View  09/27/2013   CLINICAL DATA:  Status post thoracentesis  EXAM: PORTABLE CHEST - 1 VIEW  COMPARISON:  09/21/2013  FINDINGS: Large right pleural effusion. No left pleural effusion. No pneumothorax. Stable cardiomediastinal silhouette. Unremarkable osseous structures.  IMPRESSION: Large right pleural effusion.  No pneumothorax.   Electronically Signed   By: Elige KoHetal  Patel   On: 09/27/2013 19:07   Dg Abd Acute W/chest  09/21/2013   CLINICAL DATA:  [facility] Persistent infections since. Abdominal pain and distention. History of cirrhosis.  EXAM: ACUTE ABDOMEN SERIES (ABDOMEN 2 VIEW & CHEST 1 VIEW)  COMPARISON:  CT abdomen pelvis - 09/08/2013 ; CT abdomen pelvis - 08/28/2013 chest radiograph - 09/08/2013; 09/02/2013  FINDINGS: Grossly unchanged borderline enlarged cardiac silhouette and mediastinal contours. Interval increase and now likely moderate size right-sided effusion. Unchanged small left-sided effusion. Mild cephalization of flow without frank evidence of edema.  Paucity of bowel gas  without definite evidence of obstruction. There is nonspecific mild gaseous distention of a rather featureless loop of small bowel within the mid abdomen measuring approximately 3.7 cm in diameter, nonspecific in the setting of patient's known ascites. There is a minimal amount of gas seen within the descending colon. No pneumoperitoneum, pneumatosis or portal venous gas.  No acute osseus abnormalities.  IMPRESSION: 1. Interval increase in suspected moderate sized right-sided effusion. 2. Unchanged small left-sided effusion. 3. Pulmonary venous congestion without frank evidence of edema. 4. Paucity of bowel gas without definite evidence of obstruction.   Electronically Signed   By: Simonne ComeJohn  Watts M.D.   On: 09/21/2013 09:02   [place] Thoracentesis Asp Pleural Space W/img Guide  10/15/2013   CLINICAL DATA:  Right pleural effusion  EXAM: ULTRASOUND GUIDED right THORACENTESIS  COMPARISON:  Previous right thoracentesis  PROCEDURE: An ultrasound guided thoracentesis was thoroughly discussed with the patient and questions answered. The benefits, risks, alternatives and complications were also discussed. The patient understands and wishes to proceed with the procedure. Written consent was obtained.  Ultrasound was performed to localize and mark an adequate pocket of fluid in the row chest. The area was then prepped and draped in the normal sterile fashion. 1% Lidocaine was used for local anesthesia. Under ultrasound guidance a 19 gauge Yueh catheter was introduced. Thoracentesis was performed. The catheter was removed and a dressing applied.  Complications:  None  FINDINGS: A total of approximately 2 L of orange reddish fluid was removed. A fluid sample was notsent for laboratory analysis.  IMPRESSION: Successful ultrasound guided right thoracentesis yielding 2 L of pleural fluid.  Read by:  Robet LeuPamela A Shaunna Rosetti [name]   Electronically Signed   By: Gilmer MorJaime  Wagner D.O.   On: 10/15/2013 13:51   [place] Thoracentesis Asp Pleural Space W/img  Guide  10/13/2013   CLINICAL DATA:  Right pleural effusion  EXAM: ULTRASOUND GUIDED right THORACENTESIS  COMPARISON:  Previous thoracentesis  PROCEDURE: An ultrasound guided thoracentesis was thoroughly discussed with the patient and questions answered. The benefits, risks, alternatives and complications were also discussed. The patient understands  and wishes to proceed with the procedure. Written consent was obtained.  Ultrasound was performed to localize and mark an adequate pocket of fluid in the right chest. The area was then prepped and draped in the normal sterile fashion. 1% Lidocaine was used for local anesthesia. Under ultrasound guidance a 19 gauge Yueh catheter was introduced. Thoracentesis was performed. The catheter was removed and a dressing applied.  Complications:  None  FINDINGS: A total of approximately 1.5 L of orange reddish fluid was removed. A fluid sample was notsent for laboratory analysis.  IMPRESSION: Successful ultrasound guided right thoracentesis yielding 1.5 L of pleural fluid.  Read by:  Robet LeuPamela A Chidera Thivierge   Electronically Signed   By: Malachy MoanHeath  McCullough M.D.   On: 10/13/2013 15:59    Labs:  CBC:  Recent Labs  10/09/13 1000 10/11/13 1300 10/12/13 0845 10/14/13 0551  WBC 14.4* 14.2* 13.8* 11.1*  HGB 12.2* 12.7* 12.1* 12.5*  HCT 33.8* 35.9* 34.1* 35.9*  PLT 105* 132* 141* 142*    COAGS:  Recent Labs  08/28/13 1357 08/28/13 1808  09/22/13 1139 09/27/13 0521 10/06/13 0220 10/07/13 1714  INR 2.16* 2.36*  < > 2.40* 2.17* 1.95* 1.95*  APTT 35 35  --   --   --   --   --   < > = values in this interval not displayed.  BMP:  Recent Labs  10/14/13 0551 10/15/13 0500 10/16/13 1145 10/17/13 0427  NA 125* 121* 119* 119*  K 4.7 5.2 5.2 6.1*  CL 89* 87* 87* 88*  CO2 26 26 26 25   GLUCOSE 67* 87 97 89  BUN 30* 30* 25* 26*  CALCIUM 8.1* 8.1* 7.7* 7.9*  CREATININE 0.92 0.97 0.80 0.85  GFRNONAA >90 >90 >90 >90  GFRAA >90 >90 >90 >90    LIVER FUNCTION  TESTS:  Recent Labs  10/11/13 1300 10/12/13 0845 10/13/13 0548 10/14/13 0551  BILITOT 3.2* 4.1* 4.3* 2.9*  AST 40* 42* 44* 45*  ALT 31 31 33 31  ALKPHOS 99 96 99 112  PROT 7.0 7.0 7.1 7.2  ALBUMIN 1.8* 2.1* 2.1* 1.9*    TUMOR MARKERS: No results found for this basename: AFPTM, CEA, CA199, CHROMGRNA,  in the last 8760 hours  Assessment and Plan:  Alcohol cirrhosis Hepatitis C Hepatic hydrothorax R Encephalopathy- mild GI Dr Arlyce DiceKaplan has asked for consult for poss TIPs vs [place] shunt placement Dr Loreta AveWagner has had long discussion with pt and niece See note  Will await Dr Loreta AveWagner and Dr Arlyce DiceKaplan to formulate plan   Thank you for this interesting consult.  I greatly enjoyed meeting Wyvonnia Duskyichard L Fellows and look forward to participating in their care.    I spent a total of 40 minutes face to face in clinical consultation, greater than 50% of which was counseling/coordinating care for consult for TIPs vs [place] shunt placement  Signed: Jase Himmelberger A 10/17/2013, 2:17 PM

## 2013-10-17 NOTE — Progress Notes (Signed)
PULMONARY / CRITICAL CARE MEDICINE   Name: Xavier Valentine MRN: [id number] DOB: 1960/05/22    ADMISSION DATE:  10/11/2013 CONSULTATION DATE:  10/11/2013  REFERRING MD :  Thedore MinsSingh  CHIEF COMPLAINT:  SOB  INITIAL PRESENTATION:   53 y/o male with PMH of hepC/ETOH cirrhosis, thrombocytopenia, hx of right sided transudative pleural effusions with recent thoracentesis (9/26 with 1.4L off), just d/c 10/9 (Decision at that time made to d/c on high dose of lasix and not repeat thora).  Returned 10/10 with left sided chest/rib cage pain. CXR in the ED demonstrated a reaccumulated right sided pleural effusion in addition to hyponatremia (both chronic in nature)  STUDIES:  10/10  DG ribs > Increased size of large right pleural effusion causing complete opacification of right hemithorax/ multiple L rib fx   SIGNIFICANT EVENTS: 9/20  paracentesis with 3.6 liters yellow tinged fluid removed.  9/26  thoracentesis  X 1.4 liters- transudate, hepatic hydrothorax  10/5  to ED with abd distention, improved with BM and discharged  10/6  to ED with SOB and chest pain, admitted with effusion.   10/10 readmitted for acute dyspnea and reaccumulation of fluid 10/10 Thoracentesis 1.5L orange, slightly turbid fluid  10/12  Thora by IR, 1.5 L fluid removed  SUBJECTIVE:   Improved dyspnea after 10/15 repeat R thora  VITAL SIGNS: Temp:  [97.7 F (36.5 C)-98.5 F (36.9 C)] 97.7 F (36.5 C) (10/16 0507) Pulse Rate:  [73-81] 73 (10/16 0507) Resp:  [17-18] 18 (10/16 0507) BP: (108-126)/(58-66) 116/66 mmHg (10/16 0507) SpO2:  [96 %-100 %] 96 % (10/16 0507) Weight:  [82.872 kg (182 lb 11.2 oz)] 82.872 kg (182 lb 11.2 oz) (10/16 0502)  INTAKE / OUTPUT:  Intake/Output Summary (Last 24 hours) at 10/17/13 1002 Last data filed at 10/17/13 0659  Gross per 24 hour  Intake    720 ml  Output   1275 ml  Net   -555 ml    PHYSICAL EXAMINATION: General:  Chronically ill appearing Neuro: awake alert, tangential but  oriented X3 HEENT:  PERRL (pinpoint); scleral icterus  Cardiovascular:  RRR, s1/2  Lungs:  Decreased breath sounds on right Abdomen:  Full, distended, hypoactive BS Musculoskeletal:  Pain on palpation of rib cage; full ROM  Skin: jaundice   LABS:  CBC  Recent Labs Lab 10/11/13 1300 10/12/13 0845 10/14/13 0551  WBC 14.2* 13.8* 11.1*  HGB 12.7* 12.1* 12.5*  HCT 35.9* 34.1* 35.9*  PLT 132* 141* 142*   Coag's No results found for this basename: APTT, INR,  in the last 168 hours BMET  Recent Labs Lab 10/15/13 0500 10/16/13 1145 10/17/13 0427  NA 121* 119* 119*  K 5.2 5.2 6.1*  CL 87* 87* 88*  CO2 26 26 25   BUN 30* 25* 26*  CREATININE 0.97 0.80 0.85  GLUCOSE 87 97 89   Electrolytes  Recent Labs Lab 10/15/13 0500 10/16/13 1145 10/17/13 0427  CALCIUM 8.1* 7.7* 7.9*    Liver Enzymes  Recent Labs Lab 10/12/13 0845 10/13/13 0548 10/14/13 0551  AST 42* 44* 45*  ALT 31 33 31  ALKPHOS 96 99 112  BILITOT 4.1* 4.3* 2.9*  ALBUMIN 2.1* 2.1* 1.9*   Imaging Dg Chest 2 View  10/16/2013   CLINICAL DATA:  Recurrent effusion.  Evaluate for left effusion.  EXAM: CHEST  2 VIEW  COMPARISON:  10/15/2013.  FINDINGS: Mediastinum and hilar structures normal. Heart size normal. Previous identified complete opacification the right hemithorax has partially cleared. A large right pleural effusions  present. Underlying infiltrate and/or atelectasis cannot be excluded. No significant left pleural effusion. No acute bony abnormality.  IMPRESSION: 1. Interim partial clearing of right pleural effusion. Large right effusion remains. Underlying right lower lobe atelectasis and/or consolidation may be present. 2. No evidence of left-sided pleural effusion.   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  Note that hyponatremia will be a limiting factor -Strict i/o -O2 to maintain sats > 92% -Consider TIPS vs VP shunt at some point; appreciate Dr Marzetta BoardKaplan's input here. Pt is not (has not been) on the liver transplant list. This may be a future goal if he can stabilize and prove abstinence from illicit drugs -Nebs prn -Would avoid therapeutic chest tube in setting hepatic hydrothorax (would be extremely difficult to remove) unless this is for palliation (ie a pleurex that he can drain himself) or if he develops evidence for empyema.  -consider CT chest, although suspicion for endobronchial lesion or loculated fluid is low -f/u cxr  -f/u chem  -note palliate care meeting results, appreciate their input.   *Care during the described time interval was provided by me and/or other providers on the critical care team. I have reviewed this patient's available data, including medical history, events of note, physical examination and test results as part of my evaluation.  Levy Pupaobert Brittani Purdum, MD, PhD 10/17/2013, 10:02 AM [facility] [phone] or if no answer [phone]

## 2013-10-17 NOTE — Consult Note (Signed)
I have reviewed and discussed case with Nurse Practitioner And agree with documentation and plan as noted above   Amarissa Koerner J. Jodel Mayhall D.O.  Palliative Medicine Team at [facility]  Team Phone: [phone]    

## 2013-10-18 LAB — COMPREHENSIVE METABOLIC PANEL
ALK PHOS: 97 U/L (ref 39–117)
ALT: 36 U/L (ref 0–53)
AST: 55 U/L — ABNORMAL HIGH (ref 0–37)
Albumin: 1.8 g/dL — ABNORMAL LOW (ref 3.5–5.2)
Anion gap: 7 (ref 5–15)
BILIRUBIN TOTAL: 3.1 mg/dL — AB (ref 0.3–1.2)
BUN: 27 mg/dL — AB (ref 6–23)
CHLORIDE: 89 meq/L — AB (ref 96–112)
CO2: 27 meq/L (ref 19–32)
Calcium: 8.4 mg/dL (ref 8.4–10.5)
Creatinine, Ser: 0.89 mg/dL (ref 0.50–1.35)
GFR calc non Af Amer: >90 mL/min (ref >90)
GLUCOSE: 87 mg/dL (ref 70–99)
POTASSIUM: 5.9 meq/L — AB (ref 3.7–5.3)
Sodium: 123 mEq/L — ABNORMAL LOW (ref 137–147)
Total Protein: 6.9 g/dL (ref 6.0–8.3)

## 2013-10-18 LAB — GLUCOSE, CAPILLARY
Glucose-Capillary: 71 mg/dL (ref 70–99)
Glucose-Capillary: 86 mg/dL (ref 70–99)
Glucose-Capillary: 94 mg/dL (ref 70–99)
Glucose-Capillary: 91 mg/dL (ref 70–99)

## 2013-10-18 LAB — CORTISOL: CORTISOL PLASMA: 16.8 ug/dL

## 2013-10-18 LAB — OSMOLALITY: OSMOLALITY: 263 mosm/kg — AB (ref 275–300)

## 2013-10-18 MED ORDER — SODIUM POLYSTYRENE SULFONATE 15 GM/60ML PO SUSP
15.0000 g | Freq: Once | ORAL | Status: AC
  Administered 2013-10-18: 15 g via ORAL
  Filled 2013-10-18 (×2): qty 60

## 2013-10-18 NOTE — Progress Notes (Signed)
Daily Rounding Note  10/18/2013, 8:32 AM  LOS: 7 days   SUBJECTIVE:       Large BM yest AM, some smaller BMs later in day.  Eating well.  Still with some pain in ribcage, especially with deep breathing. Pt tells me he had a one liter thoracentesis "yesterday" 10/16.  However no notes from staff or radiology support his having had thora since 10/14 when 2 liters removed from right.  Note Tresa EndoKelly Revels documentation of pt's perception of his "lack of care".  Apparently associates lack of care with staff inability to control his pain.   OBJECTIVE:         Vital signs in last 24 hours:    Temp:  [97.5 F (36.4 C)-98 F (36.7 C)] 97.5 F (36.4 C) (10/17 16100632) Pulse Rate:  [75-83] 83 (10/17 0632) Resp:  [20] 20 (10/17 0632) BP: (115-144)/(67-87) 144/87 mmHg (10/17 0632) SpO2:  [98 %] 98 % (10/17 0632) Last BM Date: 10/17/13 Filed Weights   10/15/13 0450 10/16/13 0444 10/17/13 0502  Weight: 182 lb 8 oz (82.781 kg) 181 lb 3.2 oz (82.192 kg) 182 lb 11.2 oz (82.872 kg)   General: jaundiced, ill appearing.  Alert, comfortable.  Talking non stop   Heart: RRR Chest: clear bil. But  Absent BS on the right.  Abdomen: soft, protuberant with ascites.  Not tender  Extremities: + LE edema Neuro/Psych:  Oriented x 3.  No tremor or asterixis.  Speech is non-stop, even when asked to stay quiet for lung auscultation.   Intake/Output from previous day: 10/16 0701 - 10/17 0700 In: 960 [P.O.:960] Out: 1850 [Urine:1850]  Intake/Output this shift: Total I/O In: 240 [P.O.:240] Out: -   Lab Results: No results found for this basename: WBC, HGB, HCT, PLT,  in the last 72 hours BMET  Recent Labs  10/17/13 0427 10/17/13 1410 10/18/13 0441  NA 119* 120* 123*  K 6.1* 5.1 5.9*  CL 88* 85* 89*  CO2 25 27 27   GLUCOSE 89 73 87  BUN 26* 26* 27*  CREATININE 0.85 0.87 0.89  CALCIUM 7.9* 8.5 8.4   LFT  Recent Labs  10/18/13 0441    PROT 6.9  ALBUMIN 1.8*  AST 55*  ALT 36  ALKPHOS 97  BILITOT 3.1*    Studies/Results: Dg Chest 2 View 10/16/2013     IMPRESSION: 1. Interim partial clearing of right pleural effusion. Large right effusion remains. Underlying right lower lobe atelectasis and/or consolidation may be present. 2. No evidence of left-sided pleural effusion.   Electronically Signed   By: Maisie Fushomas  Register   On: 10/16/2013 15:07   Ct Chest W Contrast 10/17/2013 FINDINGS: Examination demonstrates a large right pleural effusion with associated collapse of the right lower lobe and middle lobe. The aerated portion of the right upper lobe is within normal. Left lung demonstrates minimal focal hazy airspace density over the posterior and lower anterior left upper lobe as cannot exclude developing infection. Heart is normal size. There is a small pericardial effusion there is minimal calcified plaque over the thoracic aorta. Remaining mediastinal structures are unremarkable. There is a displaced posterior right eighth rib fracture  Images through the upper abdomen demonstrate continued moderate ascites with nodular liver suggesting cirrhosis. Stable cyst over the dome of the right lobe of the liver. Minimal loss of vertebral body height of a couple thoracic vertebrae likely due predominately to Schmorl's node formation a as cannot exclude in the following  compression fractures.  IMPRESSION: Large right pleural effusion with collapse of the right middle and lower lobes. Displaced posterior right eighth rib fracture.  Minimal focal hazy opacification over the anterior as well as posterior left upper lobe as cannot exclude developing infection.  Small pericardial effusion.  Findings suggesting cirrhosis with ascites unchanged. Right lobe liver cyst unchanged.  Loss of height of 2 thoracic vertebral bodies due mostly to Schmorl's node formation although cannot exclude evolving compression fractures.   Electronically Signed   By: Elberta Fortisaniel   Boyle M.D.   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 As of 10/6 (last check of coags) Child's-Pugh score was 13, MELD was 40.  Hepatitis C viral count and genotype labs in process.  Dr Loreta AveWagner of IR plans to contact Ms Annamarie Majorawn Drazek NP, a specialist [facility] liver [facility], to initiate a conversation regarding transplant options. * Hepatic hydrothorax. S/p multiple thoracentesis but fluid rapidly reaccumulates. Diuretic therapy constrained by hyponatremia. On 100 aldactone daily and no furosemide. Last thora 10/14, 2 liters removed from right.  IR Dr Gilmer MorJaime Wagner feels TIPS has too high risk for encephalopathy, possible elective [place] shunt next week.   * Rib fractures: continues to have cough induced chest wall pain requiring narcotics. Also possibel evolving thoracic compression fractures.  Requiring oxycodone, Fentanyl patch for pain control *  Small pericardial effusion.  * Hyponatremia.  Renal Dr Abel Prestoolodonato implemented: 1 liter fluid restriction, IV Lasix, discontinued aldactone 10/16.  The Na level is improved but still low. .   * Ascites. No SBP on 9/20 3.6 liter paracentesis. 10/7 abdominal ultrasousnd: not enough ascites to tap. Looks as though he may have accumulated.  Moderate volume ascites by CT of 10/16.  Weight stable. * Coagulopathy.  * Hepatic encephalopathy. On lactulose at home but ? Compliance, now this is a prn med. Started Rifaximin 10/10. MS improved after multiple BMs 10/14. He continues to be oriented x 3 but he is still encephalopathic (vs underlying mental health disorder) and not sure he grasps or  retains all that providers are telling him. Impulsive behavior noted by PT, inaccurate recall of dates and occurrences surrounding his health, ? Does he have Wernicke's encephalopathy?.  * Alcoholism and drug abuse. Claims abstinence since late 08/2013. No tox screens since 08/29/13.      PLAN   *  Possible [place] shunt next week.    *  coags in AM.    Jennye MoccasinSarah Gribbin  10/18/2013, 8:32 AM Pager: [phone]  GI Attending Note  I have personally taken an interval history, reviewed the chart, and examined the patient.  I agree with the extender's note, impression and recommendations.  Barbette Hairobert D. Arlyce DiceKaplan, MD, [facility] [phone]

## 2013-10-18 NOTE — Progress Notes (Signed)
Patient ID: Xavier Valentine, male   DOB: 10/11/1960, 53 y.o.   MRN: [id number] S:no new complaints  O:BP 144/87  Pulse 83  Temp(Src) 97.5 F (36.4 C) (Oral)  Resp 20  Ht 5\' 11"  (1.803 m)  Wt 82.736 kg (182 lb 6.4 oz)  BMI 25.45 kg/m2  SpO2 98%  Intake/Output Summary (Last 24 hours) at 10/18/13 1013 Last data filed at 10/18/13 [id number]  Gross per 24 hour  Intake   1080 ml  Output   1650 ml  Net   -570 ml   Intake/Output: I/O last 3 completed shifts: In: 1200 [P.O.:1200] Out: 2500 [Urine:2500]  Intake/Output this shift:  Total I/O In: 240 [P.O.:240] Out: -  Weight change:  Gen:WD WM with ascites in NAD CVS:no rub Resp:decreased BS at bases FAO:ZHYQMVHQIAbd:distended, +fluid wave Ext:+ edema   Recent Labs Lab 10/11/13 1300 10/12/13 0845 10/13/13 0548 10/14/13 0551 10/15/13 0500 10/16/13 1145 10/17/13 0427 10/17/13 1410 10/18/13 0441  NA 123* 121* 125* 125* 121* 119* 119* 120* 123*  K 4.3 4.5 5.0 4.7 5.2 5.2 6.1* 5.1 5.9*  CL 89* 86* 89* 89* 87* 87* 88* 85* 89*  CO2 24 26 26 26 26 26 25 27 27   GLUCOSE 123* 125* 86 67* 87 97 89 73 87  BUN 32* 36* 33* 30* 30* 25* 26* 26* 27*  CREATININE 0.88 0.89 0.92 0.92 0.97 0.80 0.85 0.87 0.89  ALBUMIN 1.8* 2.1* 2.1* 1.9*  --   --   --   --  1.8*  CALCIUM 8.1* 8.8 8.6 8.1* 8.1* 7.7* 7.9* 8.5 8.4  AST 40* 42* 44* 45*  --   --   --   --  55*  ALT 31 31 33 31  --   --   --   --  36   Liver Function Tests:  Recent Labs Lab 10/13/13 0548 10/14/13 0551 10/18/13 0441  AST 44* 45* 55*  ALT 33 31 36  ALKPHOS 99 112 97  BILITOT 4.3* 2.9* 3.1*  PROT 7.1 7.2 6.9  ALBUMIN 2.1* 1.9* 1.8*   No results found for this basename: LIPASE, AMYLASE,  in the last 168 hours  Recent Labs Lab 10/11/13 2020  AMMONIA 24   CBC:  Recent Labs Lab 10/11/13 1300 10/12/13 0845 10/14/13 0551  WBC 14.2* 13.8* 11.1*  HGB 12.7* 12.1* 12.5*  HCT 35.9* 34.1* 35.9*  MCV 89.5 90.2 91.8  PLT 132* 141* 142*   Cardiac Enzymes: No results found for this  basename: CKTOTAL, CKMB, CKMBINDEX, TROPONINI,  in the last 168 hours CBG:  Recent Labs Lab 10/17/13 0439 10/17/13 1125 10/17/13 1615 10/17/13 2043 10/18/13 0631  GLUCAP 94 99 83 103* 71    Iron Studies: No results found for this basename: IRON, TIBC, TRANSFERRIN, FERRITIN,  in the last 72 hours Studies/Results: Dg Chest 2 View  10/16/2013   CLINICAL DATA:  Recurrent effusion.  Evaluate for left effusion.  EXAM: CHEST  2 VIEW  COMPARISON:  10/15/2013.  FINDINGS: Mediastinum and hilar structures normal. Heart size normal. Previous identified complete opacification the right hemithorax has partially cleared. A large right pleural effusions present. Underlying infiltrate and/or atelectasis cannot be excluded. No significant left pleural effusion. No acute bony abnormality.  IMPRESSION: 1. Interim partial clearing of right pleural effusion. Large right effusion remains. Underlying right lower lobe atelectasis and/or consolidation may be present. 2. No evidence of left-sided pleural effusion.   Electronically Signed   By: Maisie Fushomas  Register   On: 10/16/2013 15:07  Ct Chest W Contrast  10/17/2013   CLINICAL DATA:  Recurrent right pleural effusion.  EXAM: CT CHEST WITH CONTRAST  TECHNIQUE: Multidetector CT imaging of the chest was performed during intravenous contrast administration.  CONTRAST:  80mL OMNIPAQUE IOHEXOL 300 MG/ML  SOLN  COMPARISON:  Chest x-ray 10/16/2013 and 10/13/2013 as well as CT abdomen 08/28/2013.  FINDINGS: Examination demonstrates a large right pleural effusion with associated collapse of the right lower lobe and middle lobe. The aerated portion of the right upper lobe is within normal. Left lung demonstrates minimal focal hazy airspace density over the posterior and lower anterior left upper lobe as cannot exclude developing infection. Heart is normal size. There is a small pericardial effusion there is minimal calcified plaque over the thoracic aorta. Remaining mediastinal  structures are unremarkable. There is a displaced posterior right eighth rib fracture  Images through the upper abdomen demonstrate continued moderate ascites with nodular liver suggesting cirrhosis. Stable cyst over the dome of the right lobe of the liver. Minimal loss of vertebral body height of a couple thoracic vertebrae likely due predominately to Schmorl's node formation a as cannot exclude in the following compression fractures.  IMPRESSION: Large right pleural effusion with collapse of the right middle and lower lobes. Displaced posterior right eighth rib fracture.  Minimal focal hazy opacification over the anterior as well as posterior left upper lobe as cannot exclude developing infection.  Small pericardial effusion.  Findings suggesting cirrhosis with ascites unchanged. Right lobe liver cyst unchanged.  Loss of height of 2 thoracic vertebral bodies due mostly to Schmorl's node formation although cannot exclude evolving compression fractures.   Electronically Signed   By: Elberta Fortis M.D.   On: 10/17/2013 15:24   . clonazePAM  0.5 mg Oral QHS  . fentaNYL  25 mcg Transdermal Q72H  . folic acid  1 mg Oral Daily  . furosemide  40 mg Intravenous BID  . lactulose  30 g Oral TID  . rifaximin  550 mg Oral Q12H  . sodium chloride  3 mL Intravenous Q12H  . tamsulosin  0.4 mg Oral Daily  . thiamine  100 mg Oral Daily    BMET    Component Value Date/Time   NA 123* 10/18/2013 0441   K 5.9* 10/18/2013 0441   CL 89* 10/18/2013 0441   CO2 27 10/18/2013 0441   GLUCOSE 87 10/18/2013 0441   BUN 27* 10/18/2013 0441   CREATININE 0.89 10/18/2013 0441   CALCIUM 8.4 10/18/2013 0441   GFRNONAA >90 10/18/2013 0441   GFRAA >90 10/18/2013 0441   CBC    Component Value Date/Time   WBC 11.1* 10/14/2013 0551   RBC 3.91* 10/14/2013 0551   HGB 12.5* 10/14/2013 0551   HCT 35.9* 10/14/2013 0551   PLT 142* 10/14/2013 0551   MCV 91.8 10/14/2013 0551   MCH 32.0 10/14/2013 0551   MCHC 34.8 10/14/2013  0551   RDW 15.2 10/14/2013 0551   LYMPHSABS 2.1 10/06/2013 0220   MONOABS 0.8 10/06/2013 0220   EOSABS 0.3 10/06/2013 0220   BASOSABS 0.0 10/06/2013 0220     Assessment/Plan:  1. Hyponatremia- in setting of cirrhosis/ascites/anasarca. Improving with changes made yesterday 1. Cont with Fluid restrict to 1 liter/day 2. Continue with IV lasix 3. Stop aldactone 4. Stop NaCl tabs 5. Increase protein intake  2. Hyperkalemia- discontinued aldactone  1. Cont IV lasix and repeat 2. Limit K in diet 3. Cirrhosis- only stopped drinking 60 days ago. Not eligible for transplant for at least  6 months  1. Cont with lactulose 4. Ascites/anasarca- considering [place] shunt per GI and IR. 5.   Jatavia Keltner A

## 2013-10-18 NOTE — Progress Notes (Signed)
PROGRESS NOTE    Xavier Valentine NWG:[id number] DOB: 05-Oct-1960 DOA: 10/11/2013 PCP: Doris CheadleADVANI, DEEPAK, MD  HPI/Brief narrative 53 year old male with history of hep C/EtOH cirrhosis, coagulopathy, thrombocytopenia, right-sided transudative pleural effusions s/p thoracentesis x1 on 09/27/13, discharged from hospital on 10/10/13, are admitted on 10/11/13 secondary to worsening left-sided chest wall pain and noted to have large right-sided pleural effusion. During previous admission, pulmonology had recommended treating his pleural effusion with Lasix and holding of thoracentesis. Pulmonology was consulted again patient undergoing repeated right thoracentesis by IR. Palliative care consulted for goals of care on 10/13.  Assessment/Plan:  1. 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 Pulmonology consulted and initially placed him on IV Lasix and oral Aldactone, without significant improvement. Lasix stopped for chronic hyponatremia. As per pulmonary, patient to undergo multiple thoracentesis until dry, advised no chest tube due to difficulty removing (hepatic hydrothorax) and consider antibiotics if declines (fever, leukocytosis). Pleural fluid culture negative to date. Underwent left thoracocentesis today. Plan for repeat CXR today. 2. Left sided chest pain/ 7 & 8 rib fractures: This was made worse by dyspnea from problem #1. Pain management and monitor. Improved. 3. 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 Added sodium chloride tabs. Fluid restriction. Follow BMP closely.  4. Hep C/EtOH cirrhosis/coagulopathy/thrombocytopenia/? Encephalopathy: Ammonia normal.  As per patient, has not had any alcohol for 2.5 months. Continue Rifaximin and lactulose. Patient apparently has followup appointment with local hepatology clinic and GI and never followed up. GI Consulted for recommendations.  5. History of alcohol abuse: Ativan protocol. No overt  withdrawal. 6. Ascites: recent ultrasound apparently had not enough ascites to tap. Denies abdominal pain. No features suggestive of SBP. Monitor. 7. Failure to thrive: Discussed extensively with patient and patient's sister/health care power of attorney Ms. Misty StanleyLisa Panell-agreeable to palliative care consult for goals of care.  8. Hyperkalemia: stop spironolactone. Repeat is normal 9. Hyponatremia: renal consulted .   Code Status: DO NOT RESUSCITATE Family Communication: none at bedside Disposition Plan: Home when medically stable.   Consultants:  Pulmonology  Palliative care team-pending  gastroenterology  Procedures:  Right therapeutic thoracentesis 10/11, 10/12, 10/14  Antibiotics:  Rifaximin   Subjective: Dyspnea improved. No chest pain, sob.  Objective: Filed Vitals:   10/17/13 2044 10/18/13 0632 10/18/13 0926 10/18/13 1428  BP: 122/67 144/87  126/66  Pulse: 79 83  83  Temp: 98 F (36.7 C) 97.5 F (36.4 C)  98 F (36.7 C)  TempSrc: Oral Oral  Oral  Resp: 20 20  18   Height:      Weight:   82.736 kg (182 lb 6.4 oz)   SpO2: 98% 98%  95%    Intake/Output Summary (Last 24 hours) at 10/18/13 1603 Last data filed at 10/18/13 1428  Gross per 24 hour  Intake   1320 ml  Output   1650 ml  Net   -330 ml   Filed Weights   10/16/13 0444 10/17/13 0502 10/18/13 0926  Weight: 82.192 kg (181 lb 3.2 oz) 82.872 kg (182 lb 11.2 oz) 82.736 kg (182 lb 6.4 oz)     Exam:  General exam: Younger, chronically ill-looking male lying comfortably propped up in bed.  Respiratory system: -much improved air entry. Reproducible lower left sided chest wall tenderness. No increased work of breathing. Cardiovascular system: S1 & S2 heard, RRR. No JVD, murmurs, gallops, clicks or pedal edema. Telemetry: Sinus rhythm. Gastrointestinal system: Abdomen is mildly distended, soft and nontender. Normal bowel sounds heard. Central nervous system: Alert  and oriented. No focal neurological  deficits. Extremities: Symmetric 5 x 5 power.   Data Reviewed: Basic Metabolic Panel:  Recent Labs Lab 10/15/13 0500 10/16/13 1145 10/17/13 0427 10/17/13 1410 10/18/13 0441  NA 121* 119* 119* 120* 123*  K 5.2 5.2 6.1* 5.1 5.9*  CL 87* 87* 88* 85* 89*  CO2 26 26 25 27 27   GLUCOSE 87 97 89 73 87  BUN 30* 25* 26* 26* 27*  CREATININE 0.97 0.80 0.85 0.87 0.89  CALCIUM 8.1* 7.7* 7.9* 8.5 8.4   Liver Function Tests:  Recent Labs Lab 10/12/13 0845 10/13/13 0548 10/14/13 0551 10/18/13 0441  AST 42* 44* 45* 55*  ALT 31 33 31 36  ALKPHOS 96 99 112 97  BILITOT 4.1* 4.3* 2.9* 3.1*  PROT 7.0 7.1 7.2 6.9  ALBUMIN 2.1* 2.1* 1.9* 1.8*   No results found for this basename: LIPASE, AMYLASE,  in the last 168 hours  Recent Labs Lab 10/11/13 2020  AMMONIA 24   CBC:  Recent Labs Lab 10/12/13 0845 10/14/13 0551  WBC 13.8* 11.1*  HGB 12.1* 12.5*  HCT 34.1* 35.9*  MCV 90.2 91.8  PLT 141* 142*   Cardiac Enzymes: No results found for this basename: CKTOTAL, CKMB, CKMBINDEX, TROPONINI,  in the last 168 hours BNP (last 3 results)  Recent Labs  09/02/13 0520 09/09/13 0430 10/07/13 1029  PROBNP 348.5* 383.9* 241.7*   CBG:  Recent Labs Lab 10/17/13 1125 10/17/13 1615 10/17/13 2043 10/18/13 0631 10/18/13 1135  GLUCAP 99 83 103* 71 86    Recent Results (from the past 240 hour(s))  BODY FLUID CULTURE     Status: None   Collection Time    10/12/13 11:54 AM      Result Value Ref Range Status   Specimen Description PLEURAL FLUID   Final   Special Requests NONE   Final   Gram Stain     Final   Value: FEW WBC PRESENT, PREDOMINANTLY PMN     NO ORGANISMS SEEN     Performed at [facility]   Culture     Final   Value: NO GROWTH 3 DAYS     Performed at [facility]   Report Status 10/16/2013 FINAL   Final          Studies: Ct Chest W Contrast  10/17/2013   CLINICAL DATA:  Recurrent right pleural effusion.  EXAM: CT CHEST WITH CONTRAST   TECHNIQUE: Multidetector CT imaging of the chest was performed during intravenous contrast administration.  CONTRAST:  80mL OMNIPAQUE IOHEXOL 300 MG/ML  SOLN  COMPARISON:  Chest x-ray 10/16/2013 and 10/13/2013 as well as CT abdomen 08/28/2013.  FINDINGS: Examination demonstrates a large right pleural effusion with associated collapse of the right lower lobe and middle lobe. The aerated portion of the right upper lobe is within normal. Left lung demonstrates minimal focal hazy airspace density over the posterior and lower anterior left upper lobe as cannot exclude developing infection. Heart is normal size. There is a small pericardial effusion there is minimal calcified plaque over the thoracic aorta. Remaining mediastinal structures are unremarkable. There is a displaced posterior right eighth rib fracture  Images through the upper abdomen demonstrate continued moderate ascites with nodular liver suggesting cirrhosis. Stable cyst over the dome of the right lobe of the liver. Minimal loss of vertebral body height of a couple thoracic vertebrae likely due predominately to Schmorl's node formation a as cannot exclude in the following compression fractures.  IMPRESSION: Large right pleural  effusion with collapse of the right middle and lower lobes. Displaced posterior right eighth rib fracture.  Minimal focal hazy opacification over the anterior as well as posterior left upper lobe as cannot exclude developing infection.  Small pericardial effusion.  Findings suggesting cirrhosis with ascites unchanged. Right lobe liver cyst unchanged.  Loss of height of 2 thoracic vertebral bodies due mostly to Schmorl's node formation although cannot exclude evolving compression fractures.   Electronically Signed   By: Elberta Fortis M.D.   On: 10/17/2013 15:24        Scheduled Meds: . clonazePAM  0.5 mg Oral QHS  . fentaNYL  25 mcg Transdermal Q72H  . folic acid  1 mg Oral Daily  . furosemide  40 mg Intravenous BID  .  lactulose  30 g Oral TID  . rifaximin  550 mg Oral Q12H  . sodium chloride  3 mL Intravenous Q12H  . tamsulosin  0.4 mg Oral Daily  . thiamine  100 mg Oral Daily   Continuous Infusions:   Principal Problem:   Recurrent right pleural effusion Active Problems:   Hyponatremia   possible encephalopathy, hepatic   Ascites due to alcoholic cirrhosis   BPH (benign prostatic hyperplasia)   Liver cirrhosis   Alcohol abuse, in remission   Hepatitis C   Acute respiratory failure with hypoxia   Weakness generalized   Palliative care encounter    Time spent: 20 mins    Yuktha Kerchner, MD, [facility] Pager [phone]  If 7PM-7AM, please contact night-coverage [email or web address] Password [email or web address] 10/18/2013, 4:03 PM    LOS: 7 days

## 2013-10-18 NOTE — Progress Notes (Signed)
PROGRESS NOTE    Xavier Valentine:[id number] DOB: January 23, 1960 DOA: 10/11/2013 PCP: [name], MD  HPI/Brief narrative 53 year old male with history of hep C/EtOH cirrhosis, coagulopathy, thrombocytopenia, right-sided transudative pleural effusions s/p thoracentesis x1 on 09/27/13, discharged from hospital on 10/10/13, are admitted on 10/11/13 secondary to worsening left-sided chest wall pain and noted to have large right-sided pleural effusion. During previous admission, pulmonology had recommended treating his pleural effusion with Lasix and holding of thoracentesis. Pulmonology was consulted again patient undergoing repeated right thoracentesis by IR. Palliative care consulted for goals of care on 10/13.  Assessment/Plan:  1. 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 Pulmonology consulted and initially placed him on IV Lasix and oral Aldactone, without significant improvement. Lasix stopped for chronic hyponatremia. As per pulmonary, patient to undergo multiple thoracentesis until dry, advised no chest tube due to difficulty removing (hepatic hydrothorax) and consider antibiotics if declines (fever, leukocytosis). Pleural fluid culture negative to date. Underwent left thoracocentesis on 10/14. Repeat cxr shows  2. Left sided chest pain/ 7 & 8 rib fractures: This was made worse by dyspnea from problem #1. Pain management and monitor. Improved. 3. 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 Added sodium chloride tabs. Fluid restriction. Follow BMP closely.  4. Hep C/EtOH cirrhosis/coagulopathy/thrombocytopenia/? Encephalopathy: Ammonia normal.  As per patient, has not had any alcohol for 2.5 months. Continue Rifaximin and lactulose. Patient apparently has followup appointment with local hepatology clinic and GI and never followed up. GI Consulted for recommendations.  GI has asked IR to consult/evaluate for poss TIPS vs [place] shunt  placement  5. History of alcohol abuse: Ativan protocol. No overt withdrawal. 6. Ascites: recent ultrasound apparently had not enough ascites to tap. Denies abdominal pain. No features suggestive of SBP. Monitor. 7. Failure to thrive: Discussed extensively with patient and patient's sister/health care power of attorney [name]-agreeable to palliative care consult for goals of care.  8. Hyperkalemia: stop spironolactone. Kayexalate given, and repeat in am.  9. Hyponatremia: renal consulted, improving.   Code Status: DO NOT RESUSCITATE Family Communication: none at bedside Disposition Plan: Home when medically stable.   Consultants:  Pulmonology  Palliative care team-pending  gastroenterology  Procedures:  Right therapeutic thoracentesis 10/11, 10/12, 10/14  Antibiotics:  Rifaximin   Subjective: Dyspnea improved. No chest pain, sob.  Objective: Filed Vitals:   10/17/13 2044 10/18/13 0632 10/18/13 0926 10/18/13 1428  BP: 122/67 144/87  126/66  Pulse: 79 83  83  Temp: 98 F (36.7 C) 97.5 F (36.4 C)  98 F (36.7 C)  TempSrc: Oral Oral  Oral  Resp: 20 20  18   Height:      Weight:   82.736 kg (182 lb 6.4 oz)   SpO2: 98% 98%  95%    Intake/Output Summary (Last 24 hours) at 10/18/13 1604 Last data filed at 10/18/13 1428  Gross per 24 hour  Intake   1320 ml  Output   1650 ml  Net   -330 ml   Filed Weights   10/16/13 0444 10/17/13 0502 10/18/13 0926  Weight: 82.192 kg (181 lb 3.2 oz) 82.872 kg (182 lb 11.2 oz) 82.736 kg (182 lb 6.4 oz)     Exam:  General exam: Younger, chronically ill-looking male lying comfortably propped up in bed.  Respiratory system: -much improved air entry. Reproducible lower left sided chest wall tenderness. No increased work of breathing. Cardiovascular system: S1 & S2 heard, RRR. No JVD, murmurs, gallops, clicks or pedal edema. Telemetry: Sinus  rhythm. Gastrointestinal system: Abdomen is mildly distended, soft and nontender.  Normal bowel sounds heard. Central nervous system: Alert and oriented. No focal neurological deficits. Extremities: Symmetric 5 x 5 power.   Data Reviewed: Basic Metabolic Panel:  Recent Labs Lab 10/15/13 0500 10/16/13 1145 10/17/13 0427 10/17/13 1410 10/18/13 0441  NA 121* 119* 119* 120* 123*  K 5.2 5.2 6.1* 5.1 5.9*  CL 87* 87* 88* 85* 89*  CO2 26 26 25 27 27   GLUCOSE 87 97 89 73 87  BUN 30* 25* 26* 26* 27*  CREATININE 0.97 0.80 0.85 0.87 0.89  CALCIUM 8.1* 7.7* 7.9* 8.5 8.4   Liver Function Tests:  Recent Labs Lab 10/12/13 0845 10/13/13 0548 10/14/13 0551 10/18/13 0441  AST 42* 44* 45* 55*  ALT 31 33 31 36  ALKPHOS 96 99 112 97  BILITOT 4.1* 4.3* 2.9* 3.1*  PROT 7.0 7.1 7.2 6.9  ALBUMIN 2.1* 2.1* 1.9* 1.8*   No results found for this basename: LIPASE, AMYLASE,  in the last 168 hours  Recent Labs Lab 10/11/13 2020  AMMONIA 24   CBC:  Recent Labs Lab 10/12/13 0845 10/14/13 0551  WBC 13.8* 11.1*  HGB 12.1* 12.5*  HCT 34.1* 35.9*  MCV 90.2 91.8  PLT 141* 142*   Cardiac Enzymes: No results found for this basename: CKTOTAL, CKMB, CKMBINDEX, TROPONINI,  in the last 168 hours BNP (last 3 results)  Recent Labs  09/02/13 0520 09/09/13 0430 10/07/13 1029  PROBNP 348.5* 383.9* 241.7*   CBG:  Recent Labs Lab 10/17/13 1125 10/17/13 1615 10/17/13 2043 10/18/13 0631 10/18/13 1135  GLUCAP 99 83 103* 71 86    Recent Results (from the past 240 hour(s))  BODY FLUID CULTURE     Status: None   Collection Time    10/12/13 11:54 AM      Result Value Ref Range Status   Specimen Description PLEURAL FLUID   Final   Special Requests NONE   Final   Gram Stain     Final   Value: FEW WBC PRESENT, PREDOMINANTLY PMN     NO ORGANISMS SEEN     Performed at [facility]   Culture     Final   Value: NO GROWTH 3 DAYS     Performed at [facility]   Report Status 10/16/2013 FINAL   Final          Studies: Ct Chest W  Contrast  10/17/2013   CLINICAL DATA:  Recurrent right pleural effusion.  EXAM: CT CHEST WITH CONTRAST  TECHNIQUE: Multidetector CT imaging of the chest was performed during intravenous contrast administration.  CONTRAST:  80mL OMNIPAQUE IOHEXOL 300 MG/ML  SOLN  COMPARISON:  Chest x-ray 10/16/2013 and 10/13/2013 as well as CT abdomen 08/28/2013.  FINDINGS: Examination demonstrates a large right pleural effusion with associated collapse of the right lower lobe and middle lobe. The aerated portion of the right upper lobe is within normal. Left lung demonstrates minimal focal hazy airspace density over the posterior and lower anterior left upper lobe as cannot exclude developing infection. Heart is normal size. There is a small pericardial effusion there is minimal calcified plaque over the thoracic aorta. Remaining mediastinal structures are unremarkable. There is a displaced posterior right eighth rib fracture  Images through the upper abdomen demonstrate continued moderate ascites with nodular liver suggesting cirrhosis. Stable cyst over the dome of the right lobe of the liver. Minimal loss of vertebral body height of a couple thoracic vertebrae likely due predominately  to Schmorl's node formation a as cannot exclude in the following compression fractures.  IMPRESSION: Large right pleural effusion with collapse of the right middle and lower lobes. Displaced posterior right eighth rib fracture.  Minimal focal hazy opacification over the anterior as well as posterior left upper lobe as cannot exclude developing infection.  Small pericardial effusion.  Findings suggesting cirrhosis with ascites unchanged. Right lobe liver cyst unchanged.  Loss of height of 2 thoracic vertebral bodies due mostly to Schmorl's node formation although cannot exclude evolving compression fractures.   Electronically Signed   By: Elberta Fortisaniel  Boyle M.D.   On: 10/17/2013 15:24        Scheduled Meds: . clonazePAM  0.5 mg Oral QHS  .  fentaNYL  25 mcg Transdermal Q72H  . folic acid  1 mg Oral Daily  . furosemide  40 mg Intravenous BID  . lactulose  30 g Oral TID  . rifaximin  550 mg Oral Q12H  . sodium chloride  3 mL Intravenous Q12H  . tamsulosin  0.4 mg Oral Daily  . thiamine  100 mg Oral Daily   Continuous Infusions:   Principal Problem:   Recurrent right pleural effusion Active Problems:   Hyponatremia   possible encephalopathy, hepatic   Ascites due to alcoholic cirrhosis   BPH (benign prostatic hyperplasia)   Liver cirrhosis   Alcohol abuse, in remission   Hepatitis C   Acute respiratory failure with hypoxia   Weakness generalized   Palliative care encounter    Time spent: 20 mins    Trampas Stettner, MD, [facility] Pager [phone]  If 7PM-7AM, please contact night-coverage [email or web address] Password [email or web address] 10/18/2013, 4:04 PM    LOS: 7 days

## 2013-10-19 LAB — COMPREHENSIVE METABOLIC PANEL
ALK PHOS: 94 U/L (ref 39–117)
ALT: 38 U/L (ref 0–53)
AST: 58 U/L — AB (ref 0–37)
Albumin: 1.7 g/dL — ABNORMAL LOW (ref 3.5–5.2)
Anion gap: 8 (ref 5–15)
BILIRUBIN TOTAL: 2.8 mg/dL — AB (ref 0.3–1.2)
BUN: 30 mg/dL — ABNORMAL HIGH (ref 6–23)
CHLORIDE: 87 meq/L — AB (ref 96–112)
CO2: 26 meq/L (ref 19–32)
CREATININE: 0.87 mg/dL (ref 0.50–1.35)
Calcium: 8.1 mg/dL — ABNORMAL LOW (ref 8.4–10.5)
GFR calc Af Amer: >90 mL/min (ref >90)
Glucose, Bld: 97 mg/dL (ref 70–99)
POTASSIUM: 5.3 meq/L (ref 3.7–5.3)
Sodium: 121 mEq/L — CL (ref 137–147)
Total Protein: 6.8 g/dL (ref 6.0–8.3)

## 2013-10-19 LAB — PROTIME-INR
INR: 1.96 — ABNORMAL HIGH (ref 0.00–1.49)
PROTHROMBIN TIME: 22.5 s — AB (ref 11.6–15.2)

## 2013-10-19 LAB — GLUCOSE, CAPILLARY
GLUCOSE-CAPILLARY: 96 mg/dL (ref 70–99)
Glucose-Capillary: 103 mg/dL — ABNORMAL HIGH (ref 70–99)
Glucose-Capillary: 107 mg/dL — ABNORMAL HIGH (ref 70–99)
Glucose-Capillary: 108 mg/dL — ABNORMAL HIGH (ref 70–99)

## 2013-10-19 MED ORDER — POLYETHYLENE GLYCOL 3350 17 G PO PACK
17.0000 g | PACK | Freq: Every day | ORAL | Status: DC | PRN
  Filled 2013-10-19: qty 1

## 2013-10-19 MED ORDER — FUROSEMIDE 10 MG/ML IJ SOLN
80.0000 mg | Freq: Two times a day (BID) | INTRAMUSCULAR | Status: DC
  Administered 2013-10-19 – 2013-10-23 (×8): 80 mg via INTRAVENOUS
  Filled 2013-10-19 (×10): qty 8

## 2013-10-19 NOTE — Progress Notes (Signed)
Daily Rounding Note  10/19/2013, 12:22 PM  LOS: 8 days   SUBJECTIVE:       Pt concerned he has a bowel obstruction because after several loose BMs, he felt like he needed to poop but couldn't and felt like there was something retained in the rectum.  No abdominal pain.  Abd distention is stable.  Eating very well, no N/V.  Continues with non stop verbal diarrhea.   Worried that he is missing the GI appt on 10/20 for a "procedure" but this conflicts with what he thinks is plan for IR procedure the same day.  However do not [facility] yet.   OBJECTIVE:         Vital signs in last 24 hours:    Temp:  [98 F (36.7 C)-98.2 F (36.8 C)] 98.2 F (36.8 C) (10/18 0615) Pulse Rate:  [83-84] 84 (10/18 0615) Resp:  [18-20] 19 (10/18 0615) BP: (118-134)/(66-81) 118/67 mmHg (10/18 0615) SpO2:  [95 %-100 %] 95 % (10/18 0615) Weight:  [180 lb 1.9 oz (81.7 kg)] 180 lb 1.9 oz (81.7 kg) (10/18 0615) Last BM Date: 10/18/13 Filed Weights   10/17/13 0502 10/18/13 0926 10/19/13 0615  Weight: 182 lb 11.2 oz (82.872 kg) 182 lb 6.4 oz (82.736 kg) 180 lb 1.9 oz (81.7 kg)   General: comfortable, rapid speech.    Heart: RRR Chest: clear bil Abdomen: distended, soft, NT, active BS  Extremities: pedal edema is nearly resolved Neuro/Psych:  Rapid, pressured speech.  Oriented x 3.    Intake/Output from previous day: 10/17 0701 - 10/18 0700 In: 1020 [P.O.:1020] Out: 1150 [Urine:1150]  Intake/Output this shift: Total I/O In: 240 [P.O.:240] Out: 700 [Urine:700]  Lab Results: No results found for this basename: WBC, HGB, HCT, PLT,  in the last 72 hours BMET  Recent Labs  10/17/13 1410 10/18/13 0441 10/19/13 0358  NA 120* 123* 121*  K 5.1 5.9* 5.3  CL 85* 89* 87*  CO2 27 27 26   GLUCOSE 73 87 97  BUN 26* 27* 30*  CREATININE 0.87 0.89 0.87  CALCIUM 8.5 8.4 8.1*   LFT  Recent Labs   10/18/13 0441 10/19/13 0358  PROT 6.9 6.8  ALBUMIN 1.8* 1.7*  AST 55* 58*  ALT 36 38  ALKPHOS 97 94  BILITOT 3.1* 2.8*   PT/INR  Recent Labs  10/19/13 0358  LABPROT 22.5*  INR 1.96*   Hep C  Quant and genotype studies in process.   Scheduled Meds: . clonazePAM  0.5 mg Oral QHS  . fentaNYL  25 mcg Transdermal Q72H  . folic acid  1 mg Oral Daily  . furosemide  40 mg Intravenous BID  . lactulose  30 g Oral TID  . rifaximin  550 mg Oral Q12H  . sodium chloride  3 mL Intravenous Q12H  . tamsulosin  0.4 mg Oral Daily  . thiamine  100 mg Oral Daily   Continuous Infusions:  PRN Meds:.albuterol, docusate sodium, guaiFENesin-dextromethorphan, morphine injection, ondansetron (ZOFRAN) IV, ondansetron, oxyCODONE, polyethylene glycol   ASSESMENT:   * Late stage cirrhosis from hep C and ETOH. As of 10/6 (last check of coags) Child's-Pugh score was 13, MELD was 40. Hepatitis C viral count and genotype labs in process. Dr Loreta AveWagner of IR plans to contact Ms Annamarie Majorawn Drazek NP, a specialist [facility] liver [facility], to initiate a conversation regarding transplant options.  * Hepatic hydrothorax.  S/p multiple thoracentesis but fluid rapidly reaccumulates. Diuretic therapy constrained by hyponatremia. On 100 aldactone daily and no furosemide. Last thora 10/14, 2 liters removed from right.  IR Dr Gilmer MorJaime Wagner feels TIPS has too high risk for encephalopathy, possible elective [place] shunt next week.  * Rib fractures: continues to have cough induced chest wall pain requiring narcotics. Also possibel evolving thoracic compression fractures. Requiring oxycodone, Fentanyl patch for pain control  * Small pericardial effusion.  * Hyponatremia. Renal Dr Abel Prestoolodonato implemented: 1 liter fluid restriction, IV Lasix, discontinued aldactone 10/16. The Na level is fluctuating and overall minimally improved .  * Ascites. No SBP on 9/20 3.6 liter paracentesis. 10/7 abdominal ultrasound: not enough ascites to tap.  Looks as though he may have re accumulated since then as there is moderate volume ascites by CT of 10/16. Weight drop of 2 # in 24 hours noted. .  * Coagulopathy.  * Hepatic encephalopathy. On lactulose at home but ? Compliance, now this is a prn med. Started Rifaximin 10/10. MS improved after multiple BMs 10/14. He continues to be oriented x 3 but he is still encephalopathic (vs underlying mental health disorder) and not sure he grasps or retains all that providers are telling him. Impulsive behavior noted by PT, inaccurate recall of dates and occurrences surrounding his health, ? Does he have Wernicke's encephalopathy?.  * Alcoholism and drug abuse. Claims abstinence since late 08/2013. No tox screens since 08/29/13.      PLAN   *  ? abdominal ultrasound to reassess for ascites, however given minimal ascites on 10/8 US, resolving peripheral edema and weight dropping, unlikely to have reaccumulated significant ascites.  *  Wonder about psych consult to pin down a diagnoses and possibly add meds for his OCD features and non-stop talking.  *  IR will define plans for shunt in next few days. *  Viral hep c quant and genotype studies pending.     Jennye MoccasinSarah Gribbin  10/19/2013, 12:22 PM Pager: [phone]  GI Attending Note  I have personally taken an interval history, reviewed the chart, and examined the patient.  I agree with the extender's note, impression and recommendations. Would continue current medical Rx.  No need for ultrasound.  Barbette Hairobert D. Arlyce DiceKaplan, MD, [facility] [phone]

## 2013-10-19 NOTE — Progress Notes (Signed)
PROGRESS NOTE    Xavier DuskyRichard L Mcwright AVW:[id number] DOB: [birth year] DOA: 10/11/2013 PCP: Doris CheadleADVANI, DEEPAK, MD  HPI/Brief narrative 53 year old male with history of hep C/EtOH cirrhosis, coagulopathy, thrombocytopenia, right-sided transudative pleural effusions s/p thoracentesis x1 on 09/27/13, discharged from hospital on 10/10/13, are admitted on 10/11/13 secondary to worsening left-sided chest wall pain and noted to have large right-sided pleural effusion. During previous admission, pulmonology had recommended treating his pleural effusion with Lasix and holding of thoracentesis. Pulmonology was consulted again patient undergoing repeated right thoracentesis by IR. Palliative care consulted for goals of care on 10/13.   Assessment/Plan:  1. 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 Pulmonology consulted and initially placed him on IV Lasix and oral Aldactone, without significant improvement. Lasix stopped for chronic hyponatremia. As per pulmonary, patient to undergo multiple thoracentesis until dry, advised no chest tube due to difficulty removing (hepatic hydrothorax) and consider antibiotics if declines (fever, leukocytosis). Pleural fluid culture negative to date. Underwent left thoracocentesis on 10/14. Repeat cxr shows no left sided effusion. 2. Left sided chest pain/ 7 & 8 rib fractures: This was made worse by dyspnea from problem #1. Pain management and monitor. Improved. 3. 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 Fluid restriction of 1 liter. Follow BMP closely.  4. Hep C/EtOH cirrhosis/coagulopathy/thrombocytopenia/? Encephalopathy: Ammonia normal.  As per patient, has not had any alcohol for 2.5 months. Continue Rifaximin and lactulose. Patient apparently has followup appointment with local hepatology clinic and GI and never followed up. GI Consulted for recommendations.  GI has asked IR to consult/evaluate for poss TIPS vs [place]  shunt placement the next week.   5. History of alcohol abuse: Ativan protocol. No overt withdrawal. 6. Ascites: recent ultrasound apparently had not enough ascites to tap. Denies abdominal pain. No features suggestive of SBP. Monitor. 7. Failure to thrive: Discussed extensively with patient and patient's sister/health care power of attorney Ms. Misty StanleyLisa Panell-agreeable to palliative care consult for goals of care.  8. Hyperkalemia: stop spironolactone. Kayexalate given, and repeat in am show improvement.  9. Hyponatremia: renal consulted, improving.   Code Status: DO NOT RESUSCITATE Family Communication: none at bedside Disposition Plan: Home when medically stable.   Consultants:  Pulmonology  Palliative care team-pending  gastroenterology  Procedures:  Right therapeutic thoracentesis 10/11, 10/12, 10/14  Antibiotics:  Rifaximin   Subjective: Dyspnea improved. No chest pain, sob. Wanting to know when we are going to remove more fluid from his lungs and abdomen.   Objective: Filed Vitals:   10/18/13 1428 10/18/13 1956 10/19/13 0615 10/19/13 1508  BP: 126/66 134/81 118/67 117/66  Pulse: 83 84 84 84  Temp: 98 F (36.7 C) 98.2 F (36.8 C) 98.2 F (36.8 C) 97.8 F (36.6 C)  TempSrc: Oral Oral Oral Oral  Resp: 18 20 19 18   Height:      Weight:   81.7 kg (180 lb 1.9 oz)   SpO2: 95% 100% 95% 92%    Intake/Output Summary (Last 24 hours) at 10/19/13 1555 Last data filed at 10/19/13 1509  Gross per 24 hour  Intake    720 ml  Output   1500 ml  Net   -780 ml   Filed Weights   10/17/13 0502 10/18/13 0926 10/19/13 0615  Weight: 82.872 kg (182 lb 11.2 oz) 82.736 kg (182 lb 6.4 oz) 81.7 kg (180 lb 1.9 oz)     Exam:  General exam: Younger, chronically ill-looking male lying comfortably propped up in bed.  Respiratory system: -much improved air  entry. Reproducible lower left sided chest wall tenderness. No increased work of breathing. Cardiovascular system: S1 & S2 heard,  RRR. No JVD, murmurs, gallops, clicks or pedal edema. Telemetry: Sinus rhythm. Gastrointestinal system: Abdomen is mildly distended, soft and nontender. Normal bowel sounds heard. Central nervous system: Alert and oriented. No focal neurological deficits. Extremities: Symmetric 5 x 5 power.   Data Reviewed: Basic Metabolic Panel:  Recent Labs Lab 10/16/13 1145 10/17/13 0427 10/17/13 1410 10/18/13 0441 10/19/13 0358  NA 119* 119* 120* 123* 121*  K 5.2 6.1* 5.1 5.9* 5.3  CL 87* 88* 85* 89* 87*  CO2 26 25 27 27 26   GLUCOSE 97 89 73 87 97  BUN 25* 26* 26* 27* 30*  CREATININE 0.80 0.85 0.87 0.89 0.87  CALCIUM 7.7* 7.9* 8.5 8.4 8.1*   Liver Function Tests:  Recent Labs Lab 10/13/13 0548 10/14/13 0551 10/18/13 0441 10/19/13 0358  AST 44* 45* 55* 58*  ALT 33 31 36 38  ALKPHOS 99 112 97 94  BILITOT 4.3* 2.9* 3.1* 2.8*  PROT 7.1 7.2 6.9 6.8  ALBUMIN 2.1* 1.9* 1.8* 1.7*   No results found for this basename: LIPASE, AMYLASE,  in the last 168 hours No results found for this basename: AMMONIA,  in the last 168 hours CBC:  Recent Labs Lab 10/14/13 0551  WBC 11.1*  HGB 12.5*  HCT 35.9*  MCV 91.8  PLT 142*   Cardiac Enzymes: No results found for this basename: CKTOTAL, CKMB, CKMBINDEX, TROPONINI,  in the last 168 hours BNP (last 3 results)  Recent Labs  09/02/13 0520 09/09/13 0430 10/07/13 1029  PROBNP 348.5* 383.9* 241.7*   CBG:  Recent Labs Lab 10/18/13 1135 10/18/13 1632 10/18/13 2114 10/19/13 0638 10/19/13 1104  GLUCAP 86 94 91 96 103*    Recent Results (from the past 240 hour(s))  BODY FLUID CULTURE     Status: None   Collection Time    10/12/13 11:54 AM      Result Value Ref Range Status   Specimen Description PLEURAL FLUID   Final   Special Requests NONE   Final   Gram Stain     Final   Value: FEW WBC PRESENT, PREDOMINANTLY PMN     NO ORGANISMS SEEN     Performed at [facility]   Culture     Final   Value: NO GROWTH 3 DAYS      Performed at [facility]   Report Status 10/16/2013 FINAL   Final          Studies: No results found.      Scheduled Meds: . clonazePAM  0.5 mg Oral QHS  . fentaNYL  25 mcg Transdermal Q72H  . folic acid  1 mg Oral Daily  . furosemide  80 mg Intravenous BID  . lactulose  30 g Oral TID  . rifaximin  550 mg Oral Q12H  . sodium chloride  3 mL Intravenous Q12H  . tamsulosin  0.4 mg Oral Daily  . thiamine  100 mg Oral Daily   Continuous Infusions:   Principal Problem:   Recurrent right pleural effusion Active Problems:   Hyponatremia   possible encephalopathy, hepatic   Ascites due to alcoholic cirrhosis   BPH (benign prostatic hyperplasia)   Liver cirrhosis   Alcohol abuse, in remission   Hepatitis C   Acute respiratory failure with hypoxia   Weakness generalized   Palliative care encounter    Time spent: 20 mins    Halleigh Comes,  MD, [facility] Pager [phone]  If 7PM-7AM, please contact night-coverage [email or web address] Password [email or web address] 10/19/2013, 3:55 PM    LOS: 8 days

## 2013-10-19 NOTE — Progress Notes (Signed)
Patient ID: Xavier Valentine, male   DOB: 1960/12/07, 53 y.o.   MRN: [id number] S:no new complaints  O:BP 118/67  Pulse 84  Temp(Src) 98.2 F (36.8 C) (Oral)  Resp 19  Ht 5\' 11"  (1.803 m)  Wt 81.7 kg (180 lb 1.9 oz)  BMI 25.13 kg/m2  SpO2 95%  Intake/Output Summary (Last 24 hours) at 10/19/13 1235 Last data filed at 10/19/13 1018  Gross per 24 hour  Intake   1020 ml  Output   1700 ml  Net   -680 ml   Intake/Output: I/O last 3 completed shifts: In: 1860 [P.O.:1860] Out: 2250 [Urine:2250]  Intake/Output this shift:  Total I/O In: 240 [P.O.:240] Out: 700 [Urine:700] Weight change:  Gen:WM with ascites in NAD CVS:no rub Resp:decreased BS at bases WUJ:[id number]:distended, +fluid wave Ext:+edema   Recent Labs Lab 10/13/13 0548 10/14/13 0551 10/15/13 0500 10/16/13 1145 10/17/13 0427 10/17/13 1410 10/18/13 0441 10/19/13 0358  NA 125* 125* 121* 119* 119* 120* 123* 121*  K 5.0 4.7 5.2 5.2 6.1* 5.1 5.9* 5.3  CL 89* 89* 87* 87* 88* 85* 89* 87*  CO2 26 26 26 26 25 27 27 26   GLUCOSE 86 67* 87 97 89 73 87 97  BUN 33* 30* 30* 25* 26* 26* 27* 30*  CREATININE 0.92 0.92 0.97 0.80 0.85 0.87 0.89 0.87  ALBUMIN 2.1* 1.9*  --   --   --   --  1.8* 1.7*  CALCIUM 8.6 8.1* 8.1* 7.7* 7.9* 8.5 8.4 8.1*  AST 44* 45*  --   --   --   --  55* 58*  ALT 33 31  --   --   --   --  36 38   Liver Function Tests:  Recent Labs Lab 10/14/13 0551 10/18/13 0441 10/19/13 0358  AST 45* 55* 58*  ALT 31 36 38  ALKPHOS 112 97 94  BILITOT 2.9* 3.1* 2.8*  PROT 7.2 6.9 6.8  ALBUMIN 1.9* 1.8* 1.7*   No results found for this basename: LIPASE, AMYLASE,  in the last 168 hours No results found for this basename: AMMONIA,  in the last 168 hours CBC:  Recent Labs Lab 10/14/13 0551  WBC 11.1*  HGB 12.5*  HCT 35.9*  MCV 91.8  PLT 142*   Cardiac Enzymes: No results found for this basename: CKTOTAL, CKMB, CKMBINDEX, TROPONINI,  in the last 168 hours CBG:  Recent Labs Lab 10/18/13 1135 10/18/13 1632  10/18/13 2114 10/19/13 0638 10/19/13 1104  GLUCAP 86 94 91 96 103*    Iron Studies: No results found for this basename: IRON, TIBC, TRANSFERRIN, FERRITIN,  in the last 72 hours Studies/Results: Ct Chest W Contrast  10/17/2013   CLINICAL DATA:  Recurrent right pleural effusion.  EXAM: CT CHEST WITH CONTRAST  TECHNIQUE: Multidetector CT imaging of the chest was performed during intravenous contrast administration.  CONTRAST:  80mL OMNIPAQUE IOHEXOL 300 MG/ML  SOLN  COMPARISON:  Chest x-ray 10/16/2013 and 10/13/2013 as well as CT abdomen 08/28/2013.  FINDINGS: Examination demonstrates Valentine large right pleural effusion with associated collapse of the right lower lobe and middle lobe. The aerated portion of the right upper lobe is within normal. Left lung demonstrates minimal focal hazy airspace density over the posterior and lower anterior left upper lobe as cannot exclude developing infection. Heart is normal size. There is Valentine small pericardial effusion there is minimal calcified plaque over the thoracic aorta. Remaining mediastinal structures are unremarkable. There is Valentine displaced posterior right eighth rib fracture  Images through the upper abdomen demonstrate continued moderate ascites with nodular liver suggesting cirrhosis. Stable cyst over the dome of the right lobe of the liver. Minimal loss of vertebral body height of Valentine couple thoracic vertebrae likely due predominately to Schmorl's node formation Valentine as cannot exclude in the following compression fractures.  IMPRESSION: Large right pleural effusion with collapse of the right middle and lower lobes. Displaced posterior right eighth rib fracture.  Minimal focal hazy opacification over the anterior as well as posterior left upper lobe as cannot exclude developing infection.  Small pericardial effusion.  Findings suggesting cirrhosis with ascites unchanged. Right lobe liver cyst unchanged.  Loss of height of 2 thoracic vertebral bodies due mostly to Schmorl's  node formation although cannot exclude evolving compression fractures.   Electronically Signed   By: Elberta Fortisaniel  Boyle M.D.   On: 10/17/2013 15:24   . clonazePAM  0.5 mg Oral QHS  . fentaNYL  25 mcg Transdermal Q72H  . folic acid  1 mg Oral Daily  . furosemide  40 mg Intravenous BID  . lactulose  30 g Oral TID  . rifaximin  550 mg Oral Q12H  . sodium chloride  3 mL Intravenous Q12H  . tamsulosin  0.4 mg Oral Daily  . thiamine  100 mg Oral Daily    BMET    Component Value Date/Time   NA 121* 10/19/2013 0358   K 5.3 10/19/2013 0358   CL 87* 10/19/2013 0358   CO2 26 10/19/2013 0358   GLUCOSE 97 10/19/2013 0358   BUN 30* 10/19/2013 0358   CREATININE 0.87 10/19/2013 0358   CALCIUM 8.1* 10/19/2013 0358   GFRNONAA >90 10/19/2013 0358   GFRAA >90 10/19/2013 0358   CBC    Component Value Date/Time   WBC 11.1* 10/14/2013 0551   RBC 3.91* 10/14/2013 0551   HGB 12.5* 10/14/2013 0551   HCT 35.9* 10/14/2013 0551   PLT 142* 10/14/2013 0551   MCV 91.8 10/14/2013 0551   MCH 32.0 10/14/2013 0551   MCHC 34.8 10/14/2013 0551   RDW 15.2 10/14/2013 0551   LYMPHSABS 2.1 10/06/2013 0220   MONOABS 0.8 10/06/2013 0220   EOSABS 0.3 10/06/2013 0220   BASOSABS 0.0 10/06/2013 0220     Assessment/Plan:  1. Hyponatremia- in setting of cirrhosis/ascites/anasarca. No significant improvement from yesterday  1. Cont with Fluid restrict to 1 liter/day 2. Increase IV lasix to 80mg  bid and consider albumin IV 3. Cont to hold aldactone 4. No NaCl tabs 5. Increase protein intake  2. Hyperkalemia- discontinued aldactone  1. Cont IV lasix as above and repeat 2. Limit K in diet 3. Consider IV albumin to help facilitate diuresis  3. Cirrhosis- only stopped drinking 60 days ago. Not eligible for transplant for at least 6 months  1. Cont with lactulose 4. Ascites/anasarca- considering [place] shunt per GI and IR.  Awaiting the arrival of the shunt. 5.   Xavier Valentine

## 2013-10-20 DIAGNOSIS — K7290 Hepatic failure, unspecified without coma: Secondary | ICD-10-CM

## 2013-10-20 LAB — COMPREHENSIVE METABOLIC PANEL
ALK PHOS: 95 U/L (ref 39–117)
ALT: 41 U/L (ref 0–53)
ANION GAP: 9 (ref 5–15)
AST: 62 U/L — ABNORMAL HIGH (ref 0–37)
Albumin: 1.8 g/dL — ABNORMAL LOW (ref 3.5–5.2)
BILIRUBIN TOTAL: 3.4 mg/dL — AB (ref 0.3–1.2)
BUN: 36 mg/dL — AB (ref 6–23)
CHLORIDE: 86 meq/L — AB (ref 96–112)
CO2: 28 mEq/L (ref 19–32)
Calcium: 8.3 mg/dL — ABNORMAL LOW (ref 8.4–10.5)
Creatinine, Ser: 1.1 mg/dL (ref 0.50–1.35)
GFR calc Af Amer: 86 mL/min — ABNORMAL LOW (ref >90)
GFR calc non Af Amer: 74 mL/min — ABNORMAL LOW (ref >90)
Glucose, Bld: 113 mg/dL — ABNORMAL HIGH (ref 70–99)
Potassium: 5.1 mEq/L (ref 3.7–5.3)
SODIUM: 123 meq/L — AB (ref 137–147)
Total Protein: 6.9 g/dL (ref 6.0–8.3)

## 2013-10-20 LAB — GLUCOSE, CAPILLARY
GLUCOSE-CAPILLARY: 86 mg/dL (ref 70–99)
GLUCOSE-CAPILLARY: 126 mg/dL — AB (ref 70–99)
Glucose-Capillary: 75 mg/dL (ref 70–99)
Glucose-Capillary: 145 mg/dL — ABNORMAL HIGH (ref 70–99)

## 2013-10-20 LAB — BASIC METABOLIC PANEL
Anion gap: 8 (ref 5–15)
BUN: 35 mg/dL — AB (ref 6–23)
CHLORIDE: 86 meq/L — AB (ref 96–112)
CO2: 28 mEq/L (ref 19–32)
Calcium: 8.3 mg/dL — ABNORMAL LOW (ref 8.4–10.5)
Creatinine, Ser: 1.01 mg/dL (ref 0.50–1.35)
GFR calc Af Amer: >90 mL/min (ref >90)
GFR calc non Af Amer: 82 mL/min — ABNORMAL LOW (ref >90)
GLUCOSE: 67 mg/dL — AB (ref 70–99)
Potassium: 5.1 mEq/L (ref 3.7–5.3)
SODIUM: 122 meq/L — AB (ref 137–147)

## 2013-10-20 LAB — HCV RNA QUANT
HCV Quantitative Log: 4.97 {Log} — ABNORMAL HIGH (ref <1.18)
HCV Quantitative: 92510 IU/mL — ABNORMAL HIGH (ref <15)

## 2013-10-20 NOTE — Progress Notes (Signed)
Assessment/Plan:  1. Chronic Hyponatremia- in setting of cirrhosis/ascites  2. Cirrhosis/Ascites- considering [place] shunt per GI and IR. Awaiting the arrival of the shunt. 3. Resistant pleural effusion on R  Please see note by Resident Physician. This is challenging but need to aggressively limit free water.  I wonder if pleurodesis is an option to manage the pleural fluid. Xavier Valentine C

## 2013-10-20 NOTE — Progress Notes (Signed)
PROGRESS NOTE    Xavier Valentine:[id number] DOB: Apr 17, 1960 DOA: 10/11/2013 PCP: [name], MD  HPI/Brief narrative 53 year old male with history of hep C/EtOH cirrhosis, coagulopathy, thrombocytopenia, right-sided transudative pleural effusions s/p thoracentesis x1 on 09/27/13, discharged from hospital on 10/10/13, are admitted on 10/11/13 secondary to worsening left-sided chest wall pain and noted to have large right-sided pleural effusion. During previous admission, pulmonology had recommended treating his pleural effusion with Lasix and holding of thoracentesis. Pulmonology was consulted again patient undergoing repeated right thoracentesis by IR. Palliative care consulted for goals of care on 10/13.   Assessment/Plan:  1. 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 Pulmonology consulted and initially placed him on IV Lasix and oral Aldactone, without significant improvement. Lasix stopped for chronic hyponatremia. As per pulmonary, patient to undergo multiple thoracentesis until dry, advised no chest tube due to difficulty removing (hepatic hydrothorax) and consider antibiotics if declines (fever, leukocytosis). Pleural fluid culture negative to date. Underwent left thoracocentesis on 10/14. Repeat cxr shows no left sided effusion. 2. Left sided chest pain/ 7 & 8 rib fractures: This was made worse by dyspnea from problem #1. Pain management and monitor. Improved. 3. 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 Fluid restriction of 1 liter. Follow BMP closely.  4. Hep C/EtOH cirrhosis/coagulopathy/thrombocytopenia/? Encephalopathy: Ammonia normal.  As per patient, has not had any alcohol for 2.5 months. Continue Rifaximin and lactulose. Patient apparently has followup appointment with local hepatology clinic and GI and never followed up. GI Consulted for recommendations.  GI has asked IR to consult/evaluate for poss TIPS vs [place]  shunt placement the next week.   5. History of alcohol abuse: Ativan protocol. No overt withdrawal. 6. Ascites: recent ultrasound apparently had not enough ascites to tap. Denies abdominal pain. No features suggestive of SBP. Monitor. 7. Failure to thrive: Discussed extensively with patient and patient's sister/health care power of attorney [name]-agreeable to palliative care consult for goals of care.  8. Hyperkalemia: stop spironolactone. Kayexalate given, and repeat in am show improvement.  9. Hyponatremia: renal consulted, improving.   Code Status: DO NOT RESUSCITATE Family Communication: none at bedside Disposition Plan: Home when medically stable.   Consultants:  Pulmonology  Palliative care team-pending  gastroenterology  Procedures:  Right therapeutic thoracentesis 10/11, 10/12, 10/14  Antibiotics:  Rifaximin   Subjective: Dyspnea improved. No chest pain, sob. Wanting to know when we are going to remove more fluid from his lungs and abdomen.   Objective: Filed Vitals:   10/19/13 0615 10/19/13 1508 10/19/13 2106 10/20/13 0455  BP: 118/67 117/66 120/60 113/60  Pulse: 84 84 84 77  Temp: 98.2 F (36.8 C) 97.8 F (36.6 C) 98 F (36.7 C) 97.3 F (36.3 C)  TempSrc: Oral Oral Oral Oral  Resp: 19 18 18 18   Height:      Weight: 81.7 kg (180 lb 1.9 oz)   82.8 kg (182 lb 8.7 oz)  SpO2: 95% 92% 97% 100%    Intake/Output Summary (Last 24 hours) at 10/20/13 1848 Last data filed at 10/20/13 1500  Gross per 24 hour  Intake    240 ml  Output    900 ml  Net   -660 ml   Filed Weights   10/18/13 0926 10/19/13 0615 10/20/13 0455  Weight: 82.736 kg (182 lb 6.4 oz) 81.7 kg (180 lb 1.9 oz) 82.8 kg (182 lb 8.7 oz)     Exam:  General exam: Younger, chronically ill-looking male lying comfortably propped up in bed.  Respiratory system: -much improved air entry. Reproducible lower left sided chest wall tenderness. No increased work of breathing. Cardiovascular  system: S1 & S2 heard, RRR. No JVD, murmurs, gallops, clicks or pedal edema. Telemetry: Sinus rhythm. Gastrointestinal system: Abdomen is mildly distended, soft and nontender. Normal bowel sounds heard. Central nervous system: Alert and oriented. No focal neurological deficits. Extremities: Symmetric 5 x 5 power.   Data Reviewed: Basic Metabolic Panel:  Recent Labs Lab 10/17/13 1410 10/18/13 0441 10/19/13 0358 10/20/13 0415 10/20/13 1105  NA 120* 123* 121* 123* 122*  K 5.1 5.9* 5.3 5.1 5.1  CL 85* 89* 87* 86* 86*  CO2 27 27 26 28 28   GLUCOSE 73 87 97 113* 67*  BUN 26* 27* 30* 36* 35*  CREATININE 0.87 0.89 0.87 1.10 1.01  CALCIUM 8.5 8.4 8.1* 8.3* 8.3*   Liver Function Tests:  Recent Labs Lab 10/14/13 0551 10/18/13 0441 10/19/13 0358 10/20/13 0415  AST 45* 55* 58* 62*  ALT 31 36 38 41  ALKPHOS 112 97 94 95  BILITOT 2.9* 3.1* 2.8* 3.4*  PROT 7.2 6.9 6.8 6.9  ALBUMIN 1.9* 1.8* 1.7* 1.8*   No results found for this basename: LIPASE, AMYLASE,  in the last 168 hours No results found for this basename: AMMONIA,  in the last 168 hours CBC:  Recent Labs Lab 10/14/13 0551  WBC 11.1*  HGB 12.5*  HCT 35.9*  MCV 91.8  PLT 142*   Cardiac Enzymes: No results found for this basename: CKTOTAL, CKMB, CKMBINDEX, TROPONINI,  in the last 168 hours BNP (last 3 results)  Recent Labs  09/02/13 0520 09/09/13 0430 10/07/13 1029  PROBNP 348.5* 383.9* 241.7*   CBG:  Recent Labs Lab 10/19/13 1620 10/19/13 2109 10/20/13 0608 10/20/13 1124 10/20/13 1633  GLUCAP 107* 108* 75 86 126*    Recent Results (from the past 240 hour(s))  BODY FLUID CULTURE     Status: None   Collection Time    10/12/13 11:54 AM      Result Value Ref Range Status   Specimen Description PLEURAL FLUID   Final   Special Requests NONE   Final   Gram Stain     Final   Value: FEW WBC PRESENT, PREDOMINANTLY PMN     NO ORGANISMS SEEN     [facility]formed at [facility]   Culture     Final    Value: NO GROWTH 3 DAYS     Performed at [facility]   Report Status 10/16/2013 FINAL   Final          Studies: No results found.      Scheduled Meds: . clonazePAM  0.5 mg Oral QHS  . fentaNYL  25 mcg Transdermal Q72H  . folic acid  1 mg Oral Daily  . furosemide  80 mg Intravenous BID  . lactulose  30 g Oral TID  . rifaximin  550 mg Oral Q12H  . sodium chloride  3 mL Intravenous Q12H  . tamsulosin  0.4 mg Oral Daily  . thiamine  100 mg Oral Daily   Continuous Infusions:   Principal Problem:   Recurrent right pleural effusion Active Problems:   Hyponatremia   possible encephalopathy, hepatic   Ascites due to alcoholic cirrhosis   BPH (benign prostatic hyperplasia)   Liver cirrhosis   Alcohol abuse, in remission   Hepatitis C   Acute respiratory failure with hypoxia   Weakness generalized   Palliative care encounter    Time spent: 20  mins    Gearline Spilman, MD, [facility] Pager [phone]  If 7PM-7AM, please contact night-coverage [email or web address] Password [email or web address] 10/20/2013, 6:48 PM    LOS: 9 days

## 2013-10-20 NOTE — Progress Notes (Signed)
PULMONARY / CRITICAL CARE MEDICINE   Name: Xavier Valentine MRN: [id number] DOB: 07/21/1960    ADMISSION DATE:  10/11/2013 CONSULTATION DATE:  10/11/2013  REFERRING MD :  Thedore MinsSingh  CHIEF COMPLAINT:  SOB  INITIAL PRESENTATION:   53 y/o male with PMH of hepC/ETOH cirrhosis, thrombocytopenia, hx of right sided transudative pleural effusions with recent thoracentesis (9/26 with 1.4L off), just d/c 10/9 (Decision at that time made to d/c on high dose of lasix and not repeat thora).  Returned 10/10 with left sided chest/rib cage pain. CXR in the ED demonstrated a reaccumulated right sided pleural effusion in addition to hyponatremia (both chronic in nature)  STUDIES:  10/10  DG ribs > Increased size of large right pleural effusion causing complete opacification of right hemithorax/ multiple L rib fx   SIGNIFICANT EVENTS: 9/20  paracentesis with 3.6 liters yellow tinged fluid removed.  9/26  thoracentesis  X 1.4 liters- transudate, hepatic hydrothorax  10/5  to ED with abd distention, improved with BM and discharged  10/6  to ED with SOB and chest pain, admitted with effusion.  10/10 readmitted for acute dyspnea and reaccumulation of fluid 10/10 Thoracentesis 1.5L orange, slightly turbid fluid -->exudate  10/12  Thora by IR, 1.5 L fluid removed  SUBJECTIVE:     VITAL SIGNS: Temp:  [97.3 F (36.3 C)-98 F (36.7 C)] 97.3 F (36.3 C) (10/19 0455) Pulse Rate:  [77-84] 77 (10/19 0455) Resp:  [18] 18 (10/19 0455) BP: (113-120)/(60-66) 113/60 mmHg (10/19 0455) SpO2:  [92 %-100 %] 100 % (10/19 0455) Weight:  [82.8 kg (182 lb 8.7 oz)] 82.8 kg (182 lb 8.7 oz) (10/19 0455)  INTAKE / OUTPUT:  Intake/Output Summary (Last 24 hours) at 10/20/13 1118 Last data filed at 10/20/13 0100  Gross per 24 hour  Intake    720 ml  Output    500 ml  Net    220 ml    PHYSICAL EXAMINATION: General:  Chronically ill appearing Neuro: awake alert, tangential but oriented X3 HEENT:  PERRL (pinpoint); scleral  icterus  Cardiovascular:  RRR, s1/2  Lungs:  Decreased breath sounds on right Abdomen:  Full, distended, hypoactive BS Musculoskeletal:  Pain on palpation of rib cage; full ROM  Skin: jaundice   LABS:  CBC  Recent Labs Lab 10/14/13 0551  WBC 11.1*  HGB 12.5*  HCT 35.9*  PLT 142*   Coag's  Recent Labs Lab 10/19/13 0358  INR 1.96*   BMET  Recent Labs Lab 10/18/13 0441 10/19/13 0358 10/20/13 0415  NA 123* 121* 123*  K 5.9* 5.3 5.1  CL 89* 87* 86*  CO2 27 26 28   BUN 27* 30* 36*  CREATININE 0.89 0.87 1.10  GLUCOSE 87 97 113*   Electrolytes  Recent Labs Lab 10/18/13 0441 10/19/13 0358 10/20/13 0415  CALCIUM 8.4 8.1* 8.3*    Liver Enzymes  Recent Labs Lab 10/18/13 0441 10/19/13 0358 10/20/13 0415  AST 55* 58* 62*  ALT 36 38 41  ALKPHOS 97 94 95  BILITOT 3.1* 2.8* 3.4*  ALBUMIN 1.8* 1.7* 1.8*   Imaging CT chest 10/16 >>  COMPARISON: Chest x-ray 10/16/2013 and 10/13/2013 as well as CT  abdomen 08/28/2013.  FINDINGS:  Examination demonstrates a large right pleural effusion with  associated collapse of the right lower lobe and middle lobe. The  aerated portion of the right upper lobe is within normal. Left lung  demonstrates minimal focal hazy airspace density over the posterior  and lower anterior left upper lobe as  cannot exclude developing  infection. Heart is normal size. There is a small pericardial  effusion there is minimal calcified plaque over the thoracic aorta.  Remaining mediastinal structures are unremarkable. There is a  displaced posterior right eighth rib fracture  Images through the upper abdomen demonstrate continued moderate  ascites with nodular liver suggesting cirrhosis. Stable cyst over  the dome of the right lobe of the liver. Minimal loss of vertebral  body height of a couple thoracic vertebrae likely due predominately  to Schmorl's node formation a as cannot exclude in the following  compression fractures.  IMPRESSION:   Large right pleural effusion with collapse of the right middle and  lower lobes. Displaced posterior right eighth rib fracture.  Minimal focal hazy opacification over the anterior as well as  posterior left upper lobe as cannot exclude developing infection.  Small pericardial effusion.  Findings suggesting cirrhosis with ascites unchanged. Right lobe  liver cyst unchanged.  Loss of height of 2 thoracic vertebral bodies due mostly to  Schmorl's node formation although cannot exclude evolving  compression fractures.   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 Note that hyponatremia will be a limiting factor -palliative care discussions underway, much of the discussion will hinge on whether he is a transplant candidate. At this time he is not since he only stopped drinking 2 months ago.  -Strict i/o -O2 to maintain sats > 92% -PV shunt is planned.  -Would not place therapeutic chest tube in setting hepatic hydrothorax  -he would only get temporary relief from a repeat thora at this time, but possibly more success after the [place] shunt is performed -could consider palliative pleurex if this is the route that he decides to take with his overall care -we are available as needed. Please call if we can assist.    Levy Pupaobert Byrum, MD, PhD 10/20/2013, 12:10 PM [facility] [phone] or if no answer [phone]

## 2013-10-20 NOTE — Progress Notes (Signed)
Patient ID: Xavier Valentine, male   DOB: 1960-07-27, 53 y.o.   MRN: [id number] S:no new complaints, hoping for shunt tomorrow O:BP 113/60  Pulse 77  Temp(Src) 97.3 F (36.3 C) (Oral)  Resp 18  Ht 5\' 11"  (1.803 m)  Wt 182 lb 8.7 oz (82.8 kg)  BMI 25.47 kg/m2  SpO2 100%  Intake/Output Summary (Last 24 hours) at 10/20/13 0831 Last data filed at 10/20/13 0100  Gross per 24 hour  Intake    960 ml  Output    700 ml  Net    260 ml   Intake/Output: I/O last 3 completed shifts: In: 960 [P.O.:960] Out: 1200 [Urine:1200]  Intake/Output this shift:    Weight change: 2.3 oz (0.064 kg) Gen:WM with ascites in NAD CVS:no rub Resp:decreased BS on right GEX:[id number]:distended, +fluid wave Ext: trace edema   Recent Labs Lab 10/14/13 0551 10/15/13 0500 10/16/13 1145 10/17/13 0427 10/17/13 1410 10/18/13 0441 10/19/13 0358 10/20/13 0415  NA 125* 121* 119* 119* 120* 123* 121* 123*  K 4.7 5.2 5.2 6.1* 5.1 5.9* 5.3 5.1  CL 89* 87* 87* 88* 85* 89* 87* 86*  CO2 26 26 26 25 27 27 26 28   GLUCOSE 67* 87 97 89 73 87 97 113*  BUN 30* 30* 25* 26* 26* 27* 30* 36*  CREATININE 0.92 0.97 0.80 0.85 0.87 0.89 0.87 1.10  ALBUMIN 1.9*  --   --   --   --  1.8* 1.7* 1.8*  CALCIUM 8.1* 8.1* 7.7* 7.9* 8.5 8.4 8.1* 8.3*  AST 45*  --   --   --   --  55* 58* 62*  ALT 31  --   --   --   --  36 38 41   Liver Function Tests:  Recent Labs Lab 10/18/13 0441 10/19/13 0358 10/20/13 0415  AST 55* 58* 62*  ALT 36 38 41  ALKPHOS 97 94 95  BILITOT 3.1* 2.8* 3.4*  PROT 6.9 6.8 6.9  ALBUMIN 1.8* 1.7* 1.8*   No results found for this basename: LIPASE, AMYLASE,  in the last 168 hours No results found for this basename: AMMONIA,  in the last 168 hours CBC:  Recent Labs Lab 10/14/13 0551  WBC 11.1*  HGB 12.5*  HCT 35.9*  MCV 91.8  PLT 142*   Cardiac Enzymes: No results found for this basename: CKTOTAL, CKMB, CKMBINDEX, TROPONINI,  in the last 168 hours CBG:  Recent Labs Lab 10/19/13 0638 10/19/13 1104  10/19/13 1620 10/19/13 2109 10/20/13 0608  GLUCAP 96 103* 107* 108* 75    Iron Studies: No results found for this basename: IRON, TIBC, TRANSFERRIN, FERRITIN,  in the last 72 hours Studies/Results: No results found. . clonazePAM  0.5 mg Oral QHS  . fentaNYL  25 mcg Transdermal Q72H  . folic acid  1 mg Oral Daily  . furosemide  80 mg Intravenous BID  . lactulose  30 g Oral TID  . rifaximin  550 mg Oral Q12H  . sodium chloride  3 mL Intravenous Q12H  . tamsulosin  0.4 mg Oral Daily  . thiamine  100 mg Oral Daily    Assessment/Plan:  1. Hyponatremia, hypervolemic- in setting of cirrhosis/ascites/anasarca. No significant improvement from yesterday  1. Cont with Fluid restrict to 1 liter/day 2. Continue IV lasix to 80mg  bid and consider albumin IV to aid in diuresis 3. Cont to hold aldactone 4. No NaCl tabs 5. Increase protein intake  2. Hyperkalemia- discontinued aldactone  1. Cont IV lasix  as above 2. Limit K in diet 3. Consider IV albumin to help facilitate diuresis  3. Cirrhosis- only stopped drinking 60 days ago. Not eligible for transplant for at least 6 months, unlikely to be a candidate per GI  1. Cont with lactulose 4. Ascites/anasarca- considering [place] shunt per GI and IR.  Awaiting the arrival of the shunt.  Beverely LowAdamo, [name]

## 2013-10-20 NOTE — Progress Notes (Addendum)
    Progress Note   Subjective  feels more sob this am   Objective   Vital signs in last 24 hours: Temp:  [97.3 F (36.3 C)-98 F (36.7 C)] 97.3 F (36.3 C) (10/19 0455) Pulse Rate:  [77-84] 77 (10/19 0455) Resp:  [18] 18 (10/19 0455) BP: (113-120)/(60-66) 113/60 mmHg (10/19 0455) SpO2:  [92 %-100 %] 100 % (10/19 0455) Weight:  [182 lb 8.7 oz (82.8 kg)] 182 lb 8.7 oz (82.8 kg) (10/19 0455) Last BM Date: 10/19/13 General:    white male in NAD Heart:  Regular rate and rhythm Lungs: Respirations even and unlabored Decreased breath sounds RUL / RLL  Abdomen:  Soft, nontender , obese. Normal bowel sounds. Extremities:  Without edema. Neurologic:  Alert and oriented,  grossly normal neurologically. Psych:  Cooperative. Normal mood and affect.  Intake/Output from previous day: 10/18 0701 - 10/19 0700 In: 960 [P.O.:960] Out: 1200 [Urine:1200] Intake/Output this shift:    Recent Labs  10/18/13 0441 10/19/13 0358 10/20/13 0415  NA 123* 121* 123*  K 5.9* 5.3 5.1  CL 89* 87* 86*  CO2 27 26 28   GLUCOSE 87 97 113*  BUN 27* 30* 36*  CREATININE 0.89 0.87 1.10  CALCIUM 8.4 8.1* 8.3*   LFT  Recent Labs  10/20/13 0415  PROT 6.9  ALBUMIN 1.8*  AST 62*  ALT 41  ALKPHOS 95  BILITOT 3.4*   PT/INR  Recent Labs  10/19/13 0358  LABPROT 22.5*  INR 1.96*     Assessment / Plan:   821. 53 year old male with ETOH / HCV cirrhosis complicated portal HTN and hepatic hydrothorax requiring several thoracentesis. MELD 20. IR evaluated and doesn't feel patient is a good candidate for TIPS given history of hepatic encephalopathy. [place] shunt being entertained. IR to have Xavier Majorawn Drazek, NP, with liver transplant contact patient.   IV lasix was increased from 40mg  BID to 80mg  BID afternoon yesterday.He had 1200 urine output last 24 hours. BUN up slightly today from 30 to 36. .  2. Hyponatremia, combination of cirrhosis+ diuretics. Renal following. On fluid restricted diet. Aldactone on  hold. Renal changed lasix from PO to IV. Mild improvement in Na+ from 121 to 123.    LOS: 9 days   Xavier Valentine  10/20/2013, 9:56 AM  GI ATTENDING  Complicated case reviewed in morning report with GI colleagues. Interval history and data reviewed. Patient personally seen and examined. Family members in room as well as palliative care consult. Difficult case. 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 Clinically stable. Unlikely to be a transplant candidate. Multiple specialists addressing multiple physiologic perturbations. Really nothing to add from GI standpoint. Thoracentesis as clinically indicated. He will need outpatient support services.  [name], [name], M.D. [facility] Division of Gastroenterology

## 2013-10-20 NOTE — Progress Notes (Signed)
Progress Note from the Palliative Medicine Team at [facility]  Subjective:    -patient is alert and oriented presetnly, but with intermittent confusion and  little insight to his situation  -detailed conversation with Misty StanleyLisa his sister/HPOA-family is open to all available and offered medical interventions hoping for improvement and stabilization  -continued conversation regarding his treatment options and complications of his disease        -awaits  input from Dr Loreta AveWagner/ Radiology  -situation is laden with psycho-social components     Objective: Allergies  Allergen Reactions  . Bee Venom Swelling   Scheduled Meds: . clonazePAM  0.5 mg Oral QHS  . fentaNYL  25 mcg Transdermal Q72H  . folic acid  1 mg Oral Daily  . furosemide  80 mg Intravenous BID  . lactulose  30 g Oral TID  . rifaximin  550 mg Oral Q12H  . sodium chloride  3 mL Intravenous Q12H  . tamsulosin  0.4 mg Oral Daily  . thiamine  100 mg Oral Daily   Continuous Infusions:  PRN Meds:.albuterol, docusate sodium, guaiFENesin-dextromethorphan, morphine injection, ondansetron (ZOFRAN) IV, ondansetron, oxyCODONE, polyethylene glycol  BP 113/60  Pulse 77  Temp(Src) 97.3 F (36.3 C) (Oral)  Resp 18  Ht 5\' 11"  (1.803 m)  Wt 82.8 kg (182 lb 8.7 oz)  BMI 25.47 kg/m2  SpO2 100%   PPS:50 %  Pain Score:denies presetnly Pain Location left    Intake/Output Summary (Last 24 hours) at 10/20/13 1341 Last data filed at 10/20/13 0100  Gross per 24 hour  Intake    720 ml  Output    300 ml  Net    420 ml       Physical Exam:  General: chronically ill appearing  HEENT: + temporal muscle wasting, juandice sclera  Chest: Decreased in bases,  CVS: RRR  Abdomen:distended, tympanic, distant+ BS  Ext: BLE  trace edema  Neuro: Alert and oriented X3  Pstch: limited insight into his situation   Labs: CBC    Component Value Date/Time   WBC 11.1* 10/14/2013 0551   RBC 3.91* 10/14/2013 0551   HGB 12.5* 10/14/2013 0551    HCT 35.9* 10/14/2013 0551   PLT 142* 10/14/2013 0551   MCV 91.8 10/14/2013 0551   MCH 32.0 10/14/2013 0551   MCHC 34.8 10/14/2013 0551   RDW 15.2 10/14/2013 0551   LYMPHSABS 2.1 10/06/2013 0220   MONOABS 0.8 10/06/2013 0220   EOSABS 0.3 10/06/2013 0220   BASOSABS 0.0 10/06/2013 0220    BMET    Component Value Date/Time   NA 122* 10/20/2013 1105   K 5.1 10/20/2013 1105   CL 86* 10/20/2013 1105   CO2 28 10/20/2013 1105   GLUCOSE 67* 10/20/2013 1105   BUN 35* 10/20/2013 1105   CREATININE 1.01 10/20/2013 1105   CALCIUM 8.3* 10/20/2013 1105   GFRNONAA 82* 10/20/2013 1105   GFRAA >90 10/20/2013 1105    CMP     Component Value Date/Time   NA 122* 10/20/2013 1105   K 5.1 10/20/2013 1105   CL 86* 10/20/2013 1105   CO2 28 10/20/2013 1105   GLUCOSE 67* 10/20/2013 1105   BUN 35* 10/20/2013 1105   CREATININE 1.01 10/20/2013 1105   CALCIUM 8.3* 10/20/2013 1105   PROT 6.9 10/20/2013 0415   ALBUMIN 1.8* 10/20/2013 0415   AST 62* 10/20/2013 0415   ALT 41 10/20/2013 0415   ALKPHOS 95 10/20/2013 0415   BILITOT 3.4* 10/20/2013 0415   GFRNONAA 82* 10/20/2013 1105  GFRAA >90 10/20/2013 1105     Assessment and Plan: 1. Code Status:  DNR/DNI 2. Symptom Control:       -Pain- fentanyl patch 25 mcg/Morphine 1 mg IV every 4 hrs prn/Oxycodoen mg po every 6hrs prn        -Weakness/Fatigue- continue medical management of  ESLD 3. Psycho/Social:  Emotional support offered to patient and family.  This is a difficult situation for all.  Family is supportive and all are hopeful for continued quality life. 4. Spiritual:  Declined spiritual care consult 5. Disposition:  Likely home to live with sister Misty StanleyLisa.  (patient lives in an RV in her driveway)  Patient Documents Completed or Given: Document Given Completed  Advanced Directives Pkt    MOST X   DNR  X  Gone from My Sight    Hard Choices      Time In Time Out Total Time Spent with Patient Total Overall Time  1200 1235 35 min 35 min     Greater than 50%  of this time was spent counseling and coordinating care related to the above assessment and plan.  Lorinda CreedMary Xia Stohr NP  Palliative Medicine Team Team Phone # [phone] Pager [phone]  Discussed with Dr Blake DivineAkula 1

## 2013-10-20 NOTE — Progress Notes (Signed)
Interventional Radiology  Patient is 53 yo male admit with recurrent SOB secondary to right hepatic hydrothorax.  Fluid accumulating rapidly, with 1.5L drained 10/13/13, 2.0L drained 10/15/13, and CT on 10/17/13 showing complete right lung collapse secondary to hepatic hydrothorax.    Patient is +Hep C, with ETOH and other documented substance abuse, now manifesting symptoms of portal hypertension with hepatic hydrothorax.   Highest MELD score documented = 40.  Now MELD score = 18.   Child-Pugh category C.   Regarding treatment of his symptoms of portal hypertension: TIPS would be treatment of choice, but the patient is not a good candidate secondary to both an elevated MELD of 18 (some literature show 30-day mortality in this subset receiving TIPS as high as 50%), and symptoms of hepatic encephalopathy, which may be facilitated by a TIPS.  [place] Shunt (peritoneo-venous shunt, or pleuro-venous shunt) is for treating the symptoms of portal hypertension, and will not address the underlying pathophysiology.   Patient repeatedly expresses wishes to be a lucid as possible for whatever remainder of his life he has.  He shows poor insight into his prognosis with Marjo BickerChilds C disease.    Pleuro-venous shunt is option for alleviating symptoms of SOB secondary to his hepatic hydrothorax.  The risks include perpetuating DIC, infection/sepsis, blockage of the shunt with malfunction, worsening of his hyponatremia with manifestation of hepato-renal syndrome with subsequent death, pulmonary congestion with overload of right heart given shunting of excess fluid, cardiopulmonary collapse.    Given that patient's portal hypertension will continue with [place] Shunt, would like to exclude esophageal/gastric varices with either upper GI (best) or contrast CT, as they are commonly associated and presence would place patient at high risk for spontaneous variceal bleeding.     Will also refer patient for outpatient  evaluation for candidacy of liver transplant.   These details were discussed at length with the patient and the patient's family.    Signed,  Yvone NeuJaime S. Loreta AveWagner, DO

## 2013-10-21 ENCOUNTER — Encounter: Payer: Self-pay | Admitting: *Deleted

## 2013-10-21 ENCOUNTER — Ambulatory Visit: Payer: Medicaid - Out of State | Admitting: Physician Assistant

## 2013-10-21 DIAGNOSIS — R109 Unspecified abdominal pain: Secondary | ICD-10-CM

## 2013-10-21 MED ORDER — METOLAZONE 5 MG PO TABS
5.0000 mg | ORAL_TABLET | Freq: Two times a day (BID) | ORAL | Status: AC
  Administered 2013-10-21 (×2): 5 mg via ORAL
  Filled 2013-10-21 (×3): qty 1

## 2013-10-21 NOTE — Progress Notes (Signed)
Physical Therapy Treatment Patient Details Name: Xavier DuskyRichard L Valentine MRN: [id number] DOB: 10-29-1960 Today's Date: 10/21/2013    History of Present Illness Right-sided chest pain or shortness of breath and cough. with a past medical history of liver cirrhosis secondary to alcohol as well as hepatitis C, [facility] with a wound infection and sepsis.Cirrhosis, coagulopathy due to his liver disease, who was discharged from the hospital on September 22 Readmitted 9/26 with D/C 10/9, readmit 10/10. Thoracentesis on 9/26 and 10/12, 10/14, 10/16    PT Comments    Pt progressing with mobility with decreased sway and LOB with gait today. However, does not use energy conservation or pursed lip breathing without direct cueing. Will continue to follow to maximize safety and function.   Follow Up Recommendations  Home health PT;Supervision/Assistance - 24 hour     Equipment Recommendations  None recommended by PT    Recommendations for Other Services       Precautions / Restrictions Precautions Precautions: Fall Precaution Comments: watch sats Restrictions Weight Bearing Restrictions: No    Mobility  Bed Mobility Overal bed mobility: Modified Independent                Transfers Overall transfer level: Modified independent                  Ambulation/Gait Ambulation/Gait assistance: Supervision Ambulation Distance (Feet): 450 Feet Assistive device: None Gait Pattern/deviations: Step-through pattern;Narrow base of support   Gait velocity interpretation: at or above normal speed for age/gender General Gait Details: impulsive with decreased safety awareness, pulling O2 tank and running into environment x 4 during gait. Pt very talkative and sats drop to 85% on 2L with gait and talking. With cues to focus on pursed lip breathing sats 92% on 2L    Stairs            Wheelchair Mobility    Modified Rankin (Stroke Patients Only)        Balance                                    Cognition Arousal/Alertness: Awake/alert Behavior During Therapy: Restless Overall Cognitive Status: History of cognitive impairments - at baseline Area of Impairment: Safety/judgement         Safety/Judgement: Decreased awareness of deficits          Exercises General Exercises - Lower Extremity Long Arc Quad: AROM;Both;Seated;20 reps Hip Flexion/Marching: AROM;Both;Seated;20 reps    General Comments        Pertinent Vitals/Pain Pain Assessment: No/denies pain    Home Living                      Prior Function            PT Goals (current goals can now be found in the care plan section) Progress towards PT goals: Progressing toward goals    Frequency  Min 2X/week (decreased frequency due to prolonged hospitalization)    PT Plan Frequency needs to be updated;Current plan remains appropriate    Co-evaluation             End of Session Equipment Utilized During Treatment: Oxygen Activity Tolerance: Other (comment) (limited by frequent needs to void) Patient left: Other (comment) (in bathroom with call light)     Time: [id number] PT Time Calculation (min): 32 min  Charges:  $Gait Training: 8-22 mins $  Therapeutic Activity: 8-22 mins                    G Codes:      Delorse Lekabor, Sharlynn Seckinger Beth 10/21/2013, 10:42 AM Delaney MeigsMaija Tabor Ciaran Begay, PT [phone]

## 2013-10-21 NOTE — Progress Notes (Addendum)
Patient ID: Xavier Valentine, male   DOB: July 10, 1960, 53 y.o.   MRN: [id number]   Spoke to Dr Loreta AveWagner Plan is to have GI evaluate for esophageal varices with endoscopy today or tomorrow Dr Archer AsaMcCullough was arranging this  Plan for possible [place] shunt placement tentatively Thurs Oct 22

## 2013-10-21 NOTE — Progress Notes (Signed)
GI ATTENDING   Request for EGD to assess for varices received. Patient earlier today. Case rescheduled for tomorrow.   Wilhemina BonitoJohn N. Eda KeysPerry, [name], M.D. [facility] Division of Gastroenterology

## 2013-10-21 NOTE — Progress Notes (Signed)
Patient ID: Xavier Valentine, male   DOB: [birth year], 53 y.o.   MRN: [id number] Patient did not show for his appointment today with Amy Esterwood PA-C.  According to our system, this [facility] , [facility].

## 2013-10-21 NOTE — Progress Notes (Signed)
Patient ID: Xavier Valentine, male   DOB: 11-Aug-1960, 53 y.o.   MRN: [id number] S:no new complaints, plan for shunt tomorrow O:BP 119/69  Pulse 81  Temp(Src) 97.7 F (36.5 Valentine) (Oral)  Resp 18  Ht 5\' 11"  (1.803 m)  Wt 182 lb 1.6 oz (82.6 kg)  BMI 25.41 kg/m2  SpO2 96%  Intake/Output Summary (Last 24 hours) at 10/21/13 0746 Last data filed at 10/21/13 0345  Gross per 24 hour  Intake      0 ml  Output    975 ml  Net   -975 ml   Intake/Output: I/O last 3 completed shifts: In: 240 [P.O.:240] Out: 1275 [Urine:1275]  Intake/Output this shift:    Weight change: -7.1 oz (-0.2 kg) Gen:WM with ascites in NAD CVS:no rub Resp:decreased BS on right WUJ:[id number]:distended, +fluid wave Ext: trace edema   Recent Labs Lab 10/16/13 1145 10/17/13 0427 10/17/13 1410 10/18/13 0441 10/19/13 0358 10/20/13 0415 10/20/13 1105  NA 119* 119* 120* 123* 121* 123* 122*  K 5.2 6.1* 5.1 5.9* 5.3 5.1 5.1  CL 87* 88* 85* 89* 87* 86* 86*  CO2 26 25 27 27 26 28 28   GLUCOSE 97 89 73 87 97 113* 67*  BUN 25* 26* 26* 27* 30* 36* 35*  CREATININE 0.80 0.85 0.87 0.89 0.87 1.10 1.01  ALBUMIN  --   --   --  1.8* 1.7* 1.8*  --   CALCIUM 7.7* 7.9* 8.5 8.4 8.1* 8.3* 8.3*  AST  --   --   --  55* 58* 62*  --   ALT  --   --   --  36 38 41  --    Liver Function Tests:  Recent Labs Lab 10/18/13 0441 10/19/13 0358 10/20/13 0415  AST 55* 58* 62*  ALT 36 38 41  ALKPHOS 97 94 95  BILITOT 3.1* 2.8* 3.4*  PROT 6.9 6.8 6.9  ALBUMIN 1.8* 1.7* 1.8*   No results found for this basename: LIPASE, AMYLASE,  in the last 168 hours No results found for this basename: AMMONIA,  in the last 168 hours CBC: No results found for this basename: WBC, NEUTROABS, HGB, HCT, MCV, PLT,  in the last 168 hours Cardiac Enzymes: No results found for this basename: CKTOTAL, CKMB, CKMBINDEX, TROPONINI,  in the last 168 hours CBG:  Recent Labs Lab 10/19/13 2109 10/20/13 0608 10/20/13 1124 10/20/13 1633 10/20/13 2136  GLUCAP 108* 75 86  126* 145*    Iron Studies: No results found for this basename: IRON, TIBC, TRANSFERRIN, FERRITIN,  in the last 72 hours Studies/Results: No results found. . clonazePAM  0.5 mg Oral QHS  . fentaNYL  25 mcg Transdermal Q72H  . folic acid  1 mg Oral Daily  . furosemide  80 mg Intravenous BID  . lactulose  30 g Oral TID  . rifaximin  550 mg Oral Q12H  . sodium chloride  3 mL Intravenous Q12H  . tamsulosin  0.4 mg Oral Daily  . thiamine  100 mg Oral Daily    Assessment/Plan:  1. Hyponatremia, hypervolemic- in setting of cirrhosis/ascites/anasarca. No significant improvement from yesterday  1. Cont with Fluid restrict to 1 liter/day 2. Continue IV lasix to 80mg  bid and consider albumin IV to aid in diuresis 3. Cont to hold aldactone 4. No NaCl tabs 5. Increase protein intake  2. Hyperkalemia- discontinued aldactone  1. Cont IV lasix as above 2. Limit K in diet 3. Consider IV albumin to help facilitate diuresis  3.  Cirrhosis- only stopped drinking 60 days ago. Not eligible for transplant for at least 6 months, unlikely to be a candidate per GI  1. Cont with lactulose 4. Ascites/anasarca- considering [place] shunt per GI and IR.  Awaiting the arrival of the shunt. 5. Pleural effusion - drained and re accumulated x2, pulm consulted for possible pleuradesis  Beverely LowAdamo, Xavier  Xavier Valentine: We are not making any significant improvement in the sodium.  We have the risk of HRS with too aggressive diuresis.  He is asymptomatic, but I would be more comfortable with a higher serum sodium.  Will add metolazone PO for a couple of days.   The potassium level makes spironolactone potentially problematic.  The ESLD makes treatment challenging.  Xavier Valentine

## 2013-10-21 NOTE — Progress Notes (Signed)
PROGRESS NOTE    Xavier Valentine ZOX:[id number] DOB: 1960-02-17 DOA: 10/11/2013 PCP: [name], MD  HPI/Brief narrative 53 year old male with history of hep C/EtOH cirrhosis, coagulopathy, thrombocytopenia, right-sided transudative pleural effusions s/p thoracentesis x1 on 09/27/13, discharged from hospital on 10/10/13, are admitted on 10/11/13 secondary to worsening left-sided chest wall pain and noted to have large right-sided pleural effusion. During previous admission, pulmonology had recommended treating his pleural effusion with Lasix and holding of thoracentesis. Pulmonology was consulted again patient undergoing repeated right thoracentesis by IR. Palliative care consulted for goals of care on 10/13.  Patient scheduled for shunt placement on Thursday and he is scheduled for egd for evaluation of varices.   Assessment/Plan:  1. 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 Pulmonology consulted and initially placed him on IV Lasix and oral Aldactone, without significant improvement. Lasix stopped for chronic hyponatremia. aldactone discontinued secondary to hyperkalemia. As per pulmonary, patient to undergo multiple thoracentesis until dry, advised no chest tube due to difficulty removing (hepatic hydrothorax) and consider antibiotics if declines (fever, leukocytosis). Pleural fluid culture negative to date. Underwent left thoracocentesis on 10/14. Repeat cxr shows no left sided effusion. 2. Left sided chest pain/ 7 & 8 rib fractures: This was made worse by dyspnea from problem #1. Pain management and monitor. Improved. 3. 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 Fluid restriction of 1 liter. Follow BMP closely.  4. Hep C/EtOH cirrhosis/coagulopathy/thrombocytopenia/? Encephalopathy: Ammonia normal.  As per patient, has not had any alcohol for 2.5 months. Continue Rifaximin and lactulose. Patient apparently has followup  appointment with local hepatology clinic and GI and never followed up. GI Consulted for recommendations.  Patient scheduled for [place] shunt placement by IR on Thursday and he is scheduled for EGD in am for evaluation of varices.   5. History of alcohol abuse: Ativan protocol. No overt withdrawal. 6. Ascites: recent ultrasound apparently had not enough ascites to tap. Denies abdominal pain. No features suggestive of SBP. Monitor. 7. Failure to thrive: Discussed extensively with patient and patient's sister/health care power of attorney [name]-agreeable to palliative care consult for goals of care.  8. Hyperkalemia: stop spironolactone. Kayexalate given, and repeat in am show improvement.  9. Hyponatremia: renal consulted, improving.   Code Status: DO NOT RESUSCITATE Family Communication: none at bedside Disposition Plan: Home when medically stable.   Consultants:  Pulmonology  Palliative care team-pending  gastroenterology  Procedures:  Right therapeutic thoracentesis 10/11, 10/12, 10/14  Antibiotics:  Rifaximin   Subjective: Dyspnea improved. No chest pain, sob. Wanting to know when we are going to remove more fluid from his lungs and abdomen.   Objective: Filed Vitals:   10/20/13 0455 10/20/13 2113 10/21/13 0344 10/21/13 1036  BP: 113/60 107/47 119/69   Pulse: 77 84 81   Temp: 97.3 F (36.3 C) 98.5 F (36.9 C) 97.7 F (36.5 C)   TempSrc: Oral Oral Oral   Resp: 18 18 18    Height:      Weight: 82.8 kg (182 lb 8.7 oz)  82.6 kg (182 lb 1.6 oz)   SpO2: 100% 94% 96% 92%    Intake/Output Summary (Last 24 hours) at 10/21/13 1708 Last data filed at 10/21/13 0345  Gross per 24 hour  Intake      0 ml  Output    375 ml  Net   -375 ml   Filed Weights   10/19/13 0615 10/20/13 0455 10/21/13 0344  Weight: 81.7 kg (180 lb 1.9 oz) 82.8 kg (  182 lb 8.7 oz) 82.6 kg (182 lb 1.6 oz)     Exam:  General exam: Younger, chronically ill-looking male lying comfortably  propped up in bed.  Respiratory system: -much improved air entry. Reproducible lower left sided chest wall tenderness. No increased work of breathing. Cardiovascular system: S1 & S2 heard, RRR. No JVD, murmurs, gallops, clicks or pedal edema. Telemetry: Sinus rhythm. Gastrointestinal system: Abdomen is mildly distended, soft and nontender. Normal bowel sounds heard. Central nervous system: Alert and oriented. No focal neurological deficits. Extremities: Symmetric 5 x 5 power.   Data Reviewed: Basic Metabolic Panel:  Recent Labs Lab 10/17/13 1410 10/18/13 0441 10/19/13 0358 10/20/13 0415 10/20/13 1105  NA 120* 123* 121* 123* 122*  K 5.1 5.9* 5.3 5.1 5.1  CL 85* 89* 87* 86* 86*  CO2 27 27 26 28 28   GLUCOSE 73 87 97 113* 67*  BUN 26* 27* 30* 36* 35*  CREATININE 0.87 0.89 0.87 1.10 1.01  CALCIUM 8.5 8.4 8.1* 8.3* 8.3*   Liver Function Tests:  Recent Labs Lab 10/18/13 0441 10/19/13 0358 10/20/13 0415  AST 55* 58* 62*  ALT 36 38 41  ALKPHOS 97 94 95  BILITOT 3.1* 2.8* 3.4*  PROT 6.9 6.8 6.9  ALBUMIN 1.8* 1.7* 1.8*   No results found for this basename: LIPASE, AMYLASE,  in the last 168 hours No results found for this basename: AMMONIA,  in the last 168 hours CBC: No results found for this basename: WBC, NEUTROABS, HGB, HCT, MCV, PLT,  in the last 168 hours Cardiac Enzymes: No results found for this basename: CKTOTAL, CKMB, CKMBINDEX, TROPONINI,  in the last 168 hours BNP (last 3 results)  Recent Labs  09/02/13 0520 09/09/13 0430 10/07/13 1029  PROBNP 348.5* 383.9* 241.7*   CBG:  Recent Labs Lab 10/19/13 2109 10/20/13 0608 10/20/13 1124 10/20/13 1633 10/20/13 2136  GLUCAP 108* 75 86 126* 145*    Recent Results (from the past 240 hour(s))  BODY FLUID CULTURE     Status: None   Collection Time    10/12/13 11:54 AM      Result Value Ref Range Status   Specimen Description PLEURAL FLUID   Final   Special Requests NONE   Final   Gram Stain     Final    Value: FEW WBC PRESENT, PREDOMINANTLY PMN     NO ORGANISMS SEEN     Performed at [facility]   Culture     Final   Value: NO GROWTH 3 DAYS     Performed at [facility]   Report Status 10/16/2013 FINAL   Final          Studies: No results found.      Scheduled Meds: . clonazePAM  0.5 mg Oral QHS  . fentaNYL  25 mcg Transdermal Q72H  . folic acid  1 mg Oral Daily  . furosemide  80 mg Intravenous BID  . lactulose  30 g Oral TID  . metolazone  5 mg Oral BID  . rifaximin  550 mg Oral Q12H  . sodium chloride  3 mL Intravenous Q12H  . tamsulosin  0.4 mg Oral Daily  . thiamine  100 mg Oral Daily   Continuous Infusions:   Principal Problem:   Recurrent right pleural effusion Active Problems:   Hyponatremia   possible encephalopathy, hepatic   Ascites due to alcoholic cirrhosis   BPH (benign prostatic hyperplasia)   Liver cirrhosis   Alcohol abuse, in remission  Hepatitis C   Acute respiratory failure with hypoxia   Weakness generalized   Palliative care encounter   Abdominal pain    Time spent: 20 mins    Kaela Beitz, MD, [facility] Pager [phone]  If 7PM-7AM, please contact night-coverage [email or web address] Password [email or web address] 10/21/2013, 5:08 PM    LOS: 10 days

## 2013-10-22 ENCOUNTER — Encounter (HOSPITAL_COMMUNITY): Payer: Medicaid - Out of State | Admitting: Anesthesiology

## 2013-10-22 ENCOUNTER — Encounter (HOSPITAL_COMMUNITY): Payer: Self-pay | Admitting: Radiology

## 2013-10-22 ENCOUNTER — Inpatient Hospital Stay (HOSPITAL_COMMUNITY): Payer: Medicaid - Out of State | Admitting: Anesthesiology

## 2013-10-22 ENCOUNTER — Encounter (HOSPITAL_COMMUNITY)
Admission: EM | Disposition: A | Discharge status: Home Health | Payer: Self-pay | Source: Home / Self Care | Attending: Internal Medicine

## 2013-10-22 DIAGNOSIS — J948 Other specified pleural conditions: Secondary | ICD-10-CM

## 2013-10-22 DIAGNOSIS — E871 Hypo-osmolality and hyponatremia: Secondary | ICD-10-CM

## 2013-10-22 DIAGNOSIS — K769 Liver disease, unspecified: Secondary | ICD-10-CM

## 2013-10-22 DIAGNOSIS — K2210 Ulcer of esophagus without bleeding: Secondary | ICD-10-CM

## 2013-10-22 HISTORY — PX: ESOPHAGOGASTRODUODENOSCOPY: SHX5428

## 2013-10-22 LAB — CBC
HCT: 30.1 % — ABNORMAL LOW (ref 39.0–52.0)
Hemoglobin: 10.7 g/dL — ABNORMAL LOW (ref 13.0–17.0)
MCH: 31.8 pg (ref 26.0–34.0)
MCHC: 35.5 g/dL (ref 30.0–36.0)
MCV: 89.3 fL (ref 78.0–100.0)
Platelets: 111 10*3/uL — ABNORMAL LOW (ref 150–400)
RBC: 3.37 MIL/uL — ABNORMAL LOW (ref 4.22–5.81)
RDW: 14.6 % (ref 11.5–15.5)
WBC: 9.4 10*3/uL (ref 4.0–10.5)

## 2013-10-22 LAB — BASIC METABOLIC PANEL
Anion gap: 8 (ref 5–15)
BUN: 43 mg/dL — AB (ref 6–23)
CALCIUM: 8.3 mg/dL — AB (ref 8.4–10.5)
CO2: 29 mEq/L (ref 19–32)
CREATININE: 0.99 mg/dL (ref 0.50–1.35)
Chloride: 85 mEq/L — ABNORMAL LOW (ref 96–112)
Glucose, Bld: 73 mg/dL (ref 70–99)
POTASSIUM: 4.5 meq/L (ref 3.7–5.3)
Sodium: 122 mEq/L — ABNORMAL LOW (ref 137–147)

## 2013-10-22 SURGERY — EGD (ESOPHAGOGASTRODUODENOSCOPY)
Anesthesia: Monitor Anesthesia Care

## 2013-10-22 MED ORDER — PANTOPRAZOLE SODIUM 40 MG PO TBEC
40.0000 mg | DELAYED_RELEASE_TABLET | Freq: Every day | ORAL | Status: DC
  Administered 2013-10-23 – 2013-10-28 (×6): 40 mg via ORAL
  Filled 2013-10-22 (×6): qty 1

## 2013-10-22 MED ORDER — METOLAZONE 2.5 MG PO TABS
2.5000 mg | ORAL_TABLET | Freq: Two times a day (BID) | ORAL | Status: DC
  Administered 2013-10-22: 2.5 mg via ORAL
  Filled 2013-10-22 (×4): qty 1

## 2013-10-22 MED ORDER — SODIUM CHLORIDE 0.9 % IV SOLN
INTRAVENOUS | Status: DC
  Administered 2013-10-22: 500 mL via INTRAVENOUS

## 2013-10-22 MED ORDER — MIDAZOLAM HCL 5 MG/5ML IJ SOLN
INTRAMUSCULAR | Status: DC | PRN
  Administered 2013-10-22: 1 mg via INTRAVENOUS
  Administered 2013-10-22 (×2): 0.5 mg via INTRAVENOUS

## 2013-10-22 MED ORDER — PROPOFOL 10 MG/ML IV BOLUS
INTRAVENOUS | Status: DC | PRN
  Administered 2013-10-22 (×2): 10 mg via INTRAVENOUS

## 2013-10-22 MED ORDER — SPIRONOLACTONE 12.5 MG HALF TABLET
12.5000 mg | ORAL_TABLET | Freq: Two times a day (BID) | ORAL | Status: DC
  Administered 2013-10-22 – 2013-10-23 (×2): 12.5 mg via ORAL
  Filled 2013-10-22 (×5): qty 1

## 2013-10-22 MED ORDER — SODIUM CHLORIDE 0.9 % IV SOLN
Freq: Once | INTRAVENOUS | Status: AC
  Administered 2013-10-22: 15:00:00 via INTRAVENOUS

## 2013-10-22 MED ORDER — FENTANYL CITRATE 0.05 MG/ML IJ SOLN: 25.0000 ug | INTRAMUSCULAR | Status: DC | PRN

## 2013-10-22 MED ORDER — SODIUM CHLORIDE 0.9 % IV SOLN
25.0000 ug | Freq: Once | INTRAVENOUS | Status: DC
  Filled 2013-10-22: qty 6.3

## 2013-10-22 MED ORDER — FENTANYL CITRATE 0.05 MG/ML IJ SOLN
INTRAMUSCULAR | Status: DC | PRN
  Administered 2013-10-22: 50 ug via INTRAVENOUS

## 2013-10-22 NOTE — Op Note (Signed)
[facility] [address] [place], [id number]   ENDOSCOPY PROCEDURE REPORT PATIENT: Xavier Valentine, Xavier Valentine  MR#: [id number] BIRTHDATE: 07-05-1960 , 53  yrs. old GENDER: male ENDOSCOPIST: Roxy CedarJohn N Alyjah Lovingood Jr, MD REFERRED BY:  [facility] PROCEDURE DATE:  10/22/2013 PROCEDURE:  EGD w/ biopsy ASA CLASS:     Class III INDICATIONS:  screening for varices. MEDICATIONS: Monitored anesthesia care and Per Anesthesia TOPICAL ANESTHETIC: none DESCRIPTION OF PROCEDURE: After the risks benefits and alternatives of the procedure were thoroughly explained, informed consent was obtained.  The Pentax Gastroscope [id number] endoscope was introduced through the mouth and advanced to the second portion of the duodenum , Without limitations.  The instrument was slowly withdrawn as the mucosa was fully examined.  EXAM:The esophagus revealed a 1.5 cm circumferential ulcer at the gastroesophageal junction.  Slightly heaped up edge was biopsied.  No esophageal varices.  The stomach revealed mild portal gastropathy.  Otherwise normal.  Normal duodenum.  Retroflexed views revealed no abnormalities.     The scope was then withdrawn from the patient and the procedure completed. COMPLICATIONS: There were no immediate complications. ENDOSCOPIC IMPRESSION: 1. Distal esophageal ulcer. Likely peptic. Status post biopsy 2. NO esophageal varices... Mild portal gastropathy RECOMMENDATIONS: 1.  Pantoprazole 40 mg daily initiated 2.  Await biopsy results 3.  CONSIDER repeat Endoscopy in 8 weeks, after being on DAILY PPI, to document ulcer healing  REPEAT EXAM:  eSigned:  Roxy CedarJohn N Luman Holway Jr, MD 10/22/2013 3:14 PM   AO:[id number]:Daniel Christella HartiganJacobs, MD and The Patient

## 2013-10-22 NOTE — Anesthesia Preprocedure Evaluation (Addendum)
Anesthesia Evaluation  Patient identified by MRN, date of birth, ID band Patient awake    Reviewed: Allergy & Precautions, H&P , NPO status , Patient's Chart, lab work & pertinent test results  Airway Mallampati: II TM Distance: >3 FB Neck ROM: Full    Dental no notable dental hx. (+) Teeth Intact, Poor Dentition, Dental Advisory Given   Pulmonary neg pulmonary ROS, former smoker,  breath sounds clear to auscultation  Pulmonary exam normal       Cardiovascular negative cardio ROS  Rhythm:Regular Rate:Normal     Neuro/Psych PSYCHIATRIC DISORDERS Anxiety negative neurological ROS     GI/Hepatic negative GI ROS, (+) Cirrhosis -  Esophageal Varices and ascites  substance abuse  alcohol use, Hepatitis -, C  Endo/Other  negative endocrine ROS  Renal/GU negative Renal ROS  negative genitourinary   Musculoskeletal   Abdominal   Peds  Hematology  (+) anemia ,   Anesthesia Other Findings   Reproductive/Obstetrics negative OB ROS                        Anesthesia Physical Anesthesia Plan  ASA: III  Anesthesia Plan: MAC   Post-op Pain Management:    Induction: Intravenous  Airway Management Planned: Nasal Cannula  Additional Equipment:   Intra-op Plan:   Post-operative Plan:   Informed Consent: I have reviewed the patients History and Physical, chart, labs and discussed the procedure including the risks, benefits and alternatives for the proposed anesthesia with the patient or authorized representative who has indicated his/her understanding and acceptance.   Dental advisory given  Plan Discussed with: CRNA, Anesthesiologist and Surgeon  Anesthesia Plan Comments:        Anesthesia Quick Evaluation

## 2013-10-22 NOTE — Progress Notes (Signed)
Daily Rounding Note  10/22/2013, 10:33 AM  LOS: 11 days   SUBJECTIVE:       Rib pain is better.  Loose stools.   OBJECTIVE:         Vital signs in last 24 hours:    Temp:  [97.7 F (36.5 C)-98 F (36.7 C)] 97.7 F (36.5 C) (10/21 0500) Pulse Rate:  [77-80] 79 (10/21 0745) Resp:  [16-19] 16 (10/21 0745) BP: (109-120)/(61-65) 115/65 mmHg (10/21 0745) SpO2:  [92 %-99 %] 99 % (10/21 0745) Weight:  [182 lb 8 oz (82.781 kg)] 182 lb 8 oz (82.781 kg) (10/21 0500) Last BM Date: 10/21/13 Filed Weights   10/20/13 0455 10/21/13 0344 10/22/13 0500  Weight: 182 lb 8.7 oz (82.8 kg) 182 lb 1.6 oz (82.6 kg) 182 lb 8 oz (82.781 kg)   Not reexamined.   Intake/Output from previous day: 10/20 0701 - 10/21 0700 In: -  Out: 600 [Urine:600]  Intake/Output this shift: Total I/O In: 0  Out: 300 [Urine:300]  Lab Results:  Recent Labs  10/22/13 0332  WBC 9.4  HGB 10.7*  HCT 30.1*  PLT 111*   BMET  Recent Labs  10/20/13 0415 10/20/13 1105  NA 123* 122*  K 5.1 5.1  CL 86* 86*  CO2 28 28  GLUCOSE 113* 67*  BUN 36* 35*  CREATININE 1.10 1.01  CALCIUM 8.3* 8.3*   LFT  Recent Labs  10/20/13 0415  PROT 6.9  ALBUMIN 1.8*  AST 62*  ALT 41  ALKPHOS 95  BILITOT 3.4*   Scheduled Meds: . clonazePAM  0.5 mg Oral QHS  . fentaNYL  25 mcg Transdermal Q72H  . folic acid  1 mg Oral Daily  . furosemide  80 mg Intravenous BID  . lactulose  30 g Oral TID  . rifaximin  550 mg Oral Q12H  . sodium chloride  3 mL Intravenous Q12H  . tamsulosin  0.4 mg Oral Daily  . thiamine  100 mg Oral Daily   Continuous Infusions:  PRN Meds:.albuterol, docusate sodium, guaiFENesin-dextromethorphan, morphine injection, ondansetron (ZOFRAN) IV, ondansetron, oxyCODONE, polyethylene glycol   ASSESMENT:   * Late stage cirrhosis from hep C and ETOH. As of 10/6 (last check of coags). Child's-Pugh score 10, MELD 18 on 10/18. Hepatitis C viral  count 92, 510; genotype in process. Dr Loreta AveWagner of IR mentioned contacting Ms Annamarie Majorawn Drazek NP, a [facility], to initiate conversation regarding transplant options.   * Hepatic hydrothorax. S/p multiple thoracentesis but fluid rapidly reaccumulates. Diuretic therapy constrained by hyponatremia, hyperkalemia.  Currently on IV Lasix 80 BID.  Last thora 10/14, 2 liters removed from right.  IR plans [place] shunt this week.   * Rib fractures: continues to have cough induced chest wall pain requiring narcotics. Also possibel evolving thoracic compression fractures. Requiring oxycodone, Fentanyl patch for pain control   * Small pericardial effusion.   * Hyponatremia. Renal Dr Abel Prestoolodonato implemented: 1 liter fluid restriction, IV Lasix, discontinued aldactone 10/16. The Na level is fluctuating and overall minimally improved .   * Ascites. No SBP on 9/20 3.6 liter paracentesis. 10/7 abdominal ultrasound: not enough ascites to tap. Looks as though he may have re accumulated since then as there is moderate volume ascites by CT of 10/16. Weight stable x 5 days.    * Coagulopathy. 10/18 PT/INR: 22.5/1.96.   * Hepatic encephalopathy.  On Rifaximin and lactulose.  Continues to be oriented x 3  but has impulsive behavior (per PT), talkaholism, some confabulation.  ? Underlying mental health disorder (OCD, bipolar, Wernicke's).   * Alcoholism and drug abuse. Claims abstinence since late 08/2013. No tox screens since 08/29/13.      PLAN   *  EGD set for ~ 2 PM today.  Rescheduled due to physician schedule.  *  Peritoneovenous ([place]) shunt tentatively for 10/22 per IR.     Jennye MoccasinSarah Gribbin  10/22/2013, 10:33 AM Pager: [phone]  GI ATTENDING  As above without addition or deletion  Wilhemina BonitoJohn N. Eda KeysPerry, [name], M.D. [facility] Division of Gastroenterology

## 2013-10-22 NOTE — Transfer of Care (Signed)
Immediate Anesthesia Transfer of Care Note  Patient: Xavier Valentine  Procedure(s) Performed: Procedure(s) with comments: ESOPHAGOGASTRODUODENOSCOPY (EGD) (N/A) - rule out varices [facility]  Patient Location: PACU  Anesthesia Type:MAC  Level of Consciousness: sedated  Airway & Oxygen Therapy: Patient Spontanous Breathing and Patient connected to nasal cannula oxygen  Post-op Assessment: Report given to PACU RN and Post -op Vital signs reviewed and stable  Post vital signs: Reviewed and stable  Complications: No apparent anesthesia complications

## 2013-10-22 NOTE — Progress Notes (Signed)
PROGRESS NOTE    Xavier Valentine [id number] DOB: 06-17-1960 DOA: 10/11/2013  PCP: Doris CheadleADVANI, DEEPAK, MD  HPI/Brief narrative 53 year old male with history of hep C/EtOH cirrhosis, coagulopathy, thrombocytopenia, right-sided transudative pleural effusions s/p thoracentesis x1 on 09/27/13, discharged from hospital on 10/10/13, are admitted on 10/11/13 secondary to worsening left-sided chest wall pain and noted to have large right-sided pleural effusion. During previous admission, pulmonology had recommended treating his pleural effusion with Lasix and holding of thoracentesis. Pulmonology was consulted again patient undergoing repeated right thoracentesis by IR. Palliative care consulted for goals of care on 10/13. Patient scheduled for [place] shunt placement on Thursday and he is scheduled for EGD for evaluation of varices.   Assessment/Plan:  1. 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 Pulmonology consulted and initially placed him on IV Lasix and oral Aldactone, without significant improvement. Lasix stopped for chronic hyponatremia. Aldactone discontinued secondary to hyperkalemia. Patient underwent multiple thoracentesis but always reaccumulated. Pulmonary advised no chest tube due to difficulty removing (hepatic hydrothorax) and consider antibiotics if declines (fever, leukocytosis). Pleural fluid culture negative to date. Underwent left thoracocentesis on 10/14. Repeat cxr shows no left sided effusion.Stable from respiratory standpoint. 2. Left sided chest pain/ 7 & 8 rib fractures: This was made worse by dyspnea from problem #1. Pain management and monitor. Improved. 3. 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 Fluid restriction of 1 liter. Follow BMP closely.  4. Hep C/EtOH cirrhosis/coagulopathy/thrombocytopenia/? Encephalopathy: Ammonia normal.  As per patient, has not had any alcohol for 2.5 months.  Continue Rifaximin and lactulose. Patient apparently has followup appointment with local hepatology clinic and GI and never followed up. GI Consulted for recommendations. Patient scheduled for [place] shunt placement by IR on Thursday. EGD 10/21 for evaluation of varices.   5. History of alcohol abuse: Ativan protocol. No overt withdrawal. 6. Ascites: recent ultrasound apparently had not enough ascites to tap. Denies abdominal pain. No features suggestive of SBP. Monitor. 7. Failure to thrive: Seen by PMT. Patient wants to pursue all treatment options. 8. Hyperkalemia: stopped spironolactone. Kayexalate was given. Normal K level. 9. Hyponatremia: Likely due to liver disease. Nephrology following.   DVT Prophylaxis: SCD's Code Status: DO NOT RESUSCITATE Family Communication: none at bedside Disposition Plan: Home when medically stable. Anticipate discharge after shunt is placed. Will need follow up with Pulmonology.   Consultants:  Pulmonology  Palliative care team-pending  Gastroenterology  IR  Procedures:  Therapeutic thoracentesis 10/11, 10/12, 10/14  EGD 10/21 pending  Antibiotics:  Rifaximin   Subjective: Feels ok. No chest pain, sob. Wants something to drink.  Objective: Filed Vitals:   10/21/13 1036 10/21/13 2025 10/22/13 0500 10/22/13 0745  BP:  120/61 109/65 115/65  Pulse:  80 77 79  Temp:  98 F (36.7 C) 97.7 F (36.5 C)   TempSrc:  Oral Oral   Resp:  18 19 16   Height:      Weight:   82.781 kg (182 lb 8 oz)   SpO2: 92% 97% 95% 99%    Intake/Output Summary (Last 24 hours) at 10/22/13 1017 Last data filed at 10/22/13 0800  Gross per 24 hour  Intake      0 ml  Output    900 ml  Net   -900 ml   Filed Weights   10/20/13 0455 10/21/13 0344 10/22/13 0500  Weight: 82.8 kg (182 lb 8.7 oz) 82.6 kg (182 lb 1.6 oz) 82.781 kg (182 lb 8 oz)     Exam:  General  exam: chronically ill-looking male lying comfortably on bed.  Respiratory system: improved air  entry. Reproducible lower left sided chest wall tenderness. No increased work of breathing. Cardiovascular system: S1 & S2 heard, RRR. No JVD, murmurs, gallops, clicks or pedal edema. Telemetry: Sinus rhythm. Gastrointestinal system: Abdomen is mildly distended, soft and nontender.  Central nervous system: Alert and oriented. No focal neurological deficits.  Data Reviewed: Basic Metabolic Panel:  Recent Labs Lab 10/17/13 1410 10/18/13 0441 10/19/13 0358 10/20/13 0415 10/20/13 1105  NA 120* 123* 121* 123* 122*  K 5.1 5.9* 5.3 5.1 5.1  CL 85* 89* 87* 86* 86*  CO2 27 27 26 28 28   GLUCOSE 73 87 97 113* 67*  BUN 26* 27* 30* 36* 35*  CREATININE 0.87 0.89 0.87 1.10 1.01  CALCIUM 8.5 8.4 8.1* 8.3* 8.3*   Liver Function Tests:  Recent Labs Lab 10/18/13 0441 10/19/13 0358 10/20/13 0415  AST 55* 58* 62*  ALT 36 38 41  ALKPHOS 97 94 95  BILITOT 3.1* 2.8* 3.4*  PROT 6.9 6.8 6.9  ALBUMIN 1.8* 1.7* 1.8*   CBC:  Recent Labs Lab 10/22/13 0332  WBC 9.4  HGB 10.7*  HCT 30.1*  MCV 89.3  PLT 111*   BNP (last 3 results)  Recent Labs  09/02/13 0520 09/09/13 0430 10/07/13 1029  PROBNP 348.5* 383.9* 241.7*   CBG:  Recent Labs Lab 10/19/13 2109 10/20/13 0608 10/20/13 1124 10/20/13 1633 10/20/13 2136  GLUCAP 108* 75 86 126* 145*    Recent Results (from the past 240 hour(s))  BODY FLUID CULTURE     Status: None   Collection Time    10/12/13 11:54 AM      Result Value Ref Range Status   Specimen Description PLEURAL FLUID   Final   Special Requests NONE   Final   Gram Stain     Final   Value: FEW WBC PRESENT, PREDOMINANTLY PMN     NO ORGANISMS SEEN     Performed at [facility]   Culture     Final   Value: NO GROWTH 3 DAYS     Performed at [facility]   Report Status 10/16/2013 FINAL   Final      Scheduled Meds: . clonazePAM  0.5 mg Oral QHS  . fentaNYL  25 mcg Transdermal Q72H  . folic acid  1 mg Oral Daily  . furosemide  80 mg  Intravenous BID  . lactulose  30 g Oral TID  . rifaximin  550 mg Oral Q12H  . sodium chloride  3 mL Intravenous Q12H  . tamsulosin  0.4 mg Oral Daily  . thiamine  100 mg Oral Daily   Continuous Infusions:   Principal Problem:   Recurrent right pleural effusion Active Problems:   Hyponatremia   possible encephalopathy, hepatic   Ascites due to alcoholic cirrhosis   BPH (benign prostatic hyperplasia)   Liver cirrhosis   Alcohol abuse, in remission   Hepatitis C   Acute respiratory failure with hypoxia   Weakness generalized   Palliative care encounter   Abdominal pain   Time spent: 20 mins   Osvaldo ShipperKRISHNAN,Rheanne Cortopassi, MD  [facility] Pager [phone]  If 7PM-7AM, please contact night-coverage [email or web address] Password [email or web address] 10/22/2013, 10:17 AM    LOS: 11 days

## 2013-10-22 NOTE — Anesthesia Postprocedure Evaluation (Signed)
  Anesthesia Post-op Note  Patient: Xavier Valentine  Procedure(s) Performed: Procedure(s) with comments: ESOPHAGOGASTRODUODENOSCOPY (EGD) (N/A) - rule out varices [facility]  Patient Location: PACU  Anesthesia Type: MAC  Level of Consciousness: awake and alert   Airway and Oxygen Therapy: Patient Spontanous Breathing  Post-op Pain: none  Post-op Assessment: Post-op Vital signs reviewed, Patient's Cardiovascular Status Stable and Respiratory Function Stable  Post-op Vital Signs: Reviewed  Filed Vitals:   10/22/13 1530  BP: 114/65  Pulse: 74  Temp:   Resp: 13    Complications: No apparent anesthesia complications

## 2013-10-22 NOTE — Progress Notes (Signed)
Patient seen today with Dr. McCullough informed consent obtained and [facility] at 1:30pm. Orders placed.  Pattricia BossKoreen Chane Cowden PA-C Interventional Radiology  10/22/13  1:01 PM

## 2013-10-22 NOTE — Clinical Social Work Note (Signed)
CSW to sign off on patient.  Patient does not need CSW services.  Ervin KnackEric R. Zeriah Baysinger, MSW, Theresia MajorsLCSWA [phone] 10/22/2013 9:11 AM

## 2013-10-22 NOTE — Progress Notes (Signed)
Patient ID: Xavier Valentine, male   DOB: 02/24/1960, 53 y.o.   MRN: [id number] S:no new complaints, plan for EGD today and shunt tomorrow O:BP 115/65  Pulse 79  Temp(Src) 97.7 F (36.5 Valentine) (Oral)  Resp 16  Ht 5\' 11"  (1.803 m)  Wt 182 lb 8 oz (82.781 kg)  BMI 25.46 kg/m2  SpO2 99%  Intake/Output Summary (Last 24 hours) at 10/22/13 0802 Last data filed at 10/21/13 1700  Gross per 24 hour  Intake      0 ml  Output    600 ml  Net   -600 ml   Intake/Output: I/O last 3 completed shifts: In: -  Out: 975 [Urine:975]  Intake/Output this shift:    Weight change: 6.4 oz (0.181 kg) Gen:WM with ascites in NAD CVS:no rub Resp:decreased BS on right HYQ:MVHQIONGEAbd:distended, +fluid wave Ext: trace edema   Recent Labs Lab 10/16/13 1145 10/17/13 0427 10/17/13 1410 10/18/13 0441 10/19/13 0358 10/20/13 0415 10/20/13 1105  NA 119* 119* 120* 123* 121* 123* 122*  K 5.2 6.1* 5.1 5.9* 5.3 5.1 5.1  CL 87* 88* 85* 89* 87* 86* 86*  CO2 26 25 27 27 26 28 28   GLUCOSE 97 89 73 87 97 113* 67*  BUN 25* 26* 26* 27* 30* 36* 35*  CREATININE 0.80 0.85 0.87 0.89 0.87 1.10 1.01  ALBUMIN  --   --   --  1.8* 1.7* 1.8*  --   CALCIUM 7.7* 7.9* 8.5 8.4 8.1* 8.3* 8.3*  AST  --   --   --  55* 58* 62*  --   ALT  --   --   --  36 38 41  --    Liver Function Tests:  Recent Labs Lab 10/18/13 0441 10/19/13 0358 10/20/13 0415  AST 55* 58* 62*  ALT 36 38 41  ALKPHOS 97 94 95  BILITOT 3.1* 2.8* 3.4*  PROT 6.9 6.8 6.9  ALBUMIN 1.8* 1.7* 1.8*   No results found for this basename: LIPASE, AMYLASE,  in the last 168 hours No results found for this basename: AMMONIA,  in the last 168 hours CBC:  Recent Labs Lab 10/22/13 0332  WBC 9.4  HGB 10.7*  HCT 30.1*  MCV 89.3  PLT 111*   Cardiac Enzymes: No results found for this basename: CKTOTAL, CKMB, CKMBINDEX, TROPONINI,  in the last 168 hours CBG:  Recent Labs Lab 10/19/13 2109 10/20/13 0608 10/20/13 1124 10/20/13 1633 10/20/13 2136  GLUCAP 108* 75 86  126* 145*    Iron Studies: No results found for this basename: IRON, TIBC, TRANSFERRIN, FERRITIN,  in the last 72 hours Studies/Results: No results found. . clonazePAM  0.5 mg Oral QHS  . fentaNYL  25 mcg Transdermal Q72H  . folic acid  1 mg Oral Daily  . furosemide  80 mg Intravenous BID  . lactulose  30 g Oral TID  . rifaximin  550 mg Oral Q12H  . sodium chloride  3 mL Intravenous Q12H  . tamsulosin  0.4 mg Oral Daily  . thiamine  100 mg Oral Daily    Assessment/Plan:  1. Hyponatremia, hypervolemic- in setting of cirrhosis/ascites/anasarca. No significant improvement from yesterday  1. Cont with Fluid restrict to 1 liter/day 2. Continue IV lasix to 80mg  bid and consider albumin IV to aid in diuresis 3. Continue PO metolazone and add low dose spironolactone, monitor K closely with this change 2. Cirrhosis- only stopped drinking 60 days ago. Not eligible for transplant for at least 6 months,  unlikely to be a candidate per GI  1. Cont with lactulose 3. Ascites/anasarca- considering [place] shunt per GI and IR.  Plan for Thursday after EGD today to look for varices 4. Pleural effusion - drained and re accumulated x2, pulm consulted for possible pleuradesis  Xavier Valentine, Xavier Valentine  Renal Attending: No progress with the sodium.  Potassium down some after metolazone.  We will try to add spironolactone at a low dose plus metolazone and see if this will help. Xavier Valentine

## 2013-10-23 ENCOUNTER — Encounter (HOSPITAL_COMMUNITY): Payer: Self-pay | Admitting: Internal Medicine

## 2013-10-23 ENCOUNTER — Ambulatory Visit (HOSPITAL_COMMUNITY)
Admission: EM | Admit: 2013-10-23 | Discharge: 2013-10-23 | Disposition: A | Discharge status: Home / Self Care | Payer: Medicaid - Out of State | Source: Home / Self Care | Attending: Interventional Radiology | Admitting: Interventional Radiology

## 2013-10-23 ENCOUNTER — Inpatient Hospital Stay (HOSPITAL_COMMUNITY): Payer: Medicaid - Out of State

## 2013-10-23 VITALS — BP 86/42 | HR 79 | Temp 97.4°F | Resp 18

## 2013-10-23 DIAGNOSIS — J948 Other specified pleural conditions: Secondary | ICD-10-CM

## 2013-10-23 LAB — HEPATIC FUNCTION PANEL
ALBUMIN: 1.6 g/dL — AB (ref 3.5–5.2)
ALT: 39 U/L (ref 0–53)
AST: 58 U/L — ABNORMAL HIGH (ref 0–37)
Alkaline Phosphatase: 110 U/L (ref 39–117)
BILIRUBIN TOTAL: 2.3 mg/dL — AB (ref 0.3–1.2)
Bilirubin, Direct: 0.9 mg/dL — ABNORMAL HIGH (ref 0.0–0.3)
Indirect Bilirubin: 1.4 mg/dL — ABNORMAL HIGH (ref 0.3–0.9)
TOTAL PROTEIN: 6.5 g/dL (ref 6.0–8.3)

## 2013-10-23 LAB — RENAL FUNCTION PANEL
ALBUMIN: 1.7 g/dL — AB (ref 3.5–5.2)
ANION GAP: 9 (ref 5–15)
BUN: 51 mg/dL — ABNORMAL HIGH (ref 6–23)
CHLORIDE: 85 meq/L — AB (ref 96–112)
CO2: 27 meq/L (ref 19–32)
Calcium: 8.1 mg/dL — ABNORMAL LOW (ref 8.4–10.5)
Creatinine, Ser: 1.14 mg/dL (ref 0.50–1.35)
GFR calc Af Amer: 82 mL/min — ABNORMAL LOW (ref >90)
GFR, EST NON AFRICAN AMERICAN: 71 mL/min — AB (ref >90)
Glucose, Bld: 94 mg/dL (ref 70–99)
POTASSIUM: 4.3 meq/L (ref 3.7–5.3)
Phosphorus: 4.9 mg/dL — ABNORMAL HIGH (ref 2.3–4.6)
Sodium: 121 mEq/L — CL (ref 137–147)

## 2013-10-23 LAB — DIC (DISSEMINATED INTRAVASCULAR COAGULATION) PANEL
Fibrinogen: 106 mg/dL — ABNORMAL LOW (ref 204–475)
INR: 2.13 — ABNORMAL HIGH (ref 0.00–1.49)
PLATELETS: 94 10*3/uL — AB (ref 150–400)
Prothrombin Time: 24 seconds — ABNORMAL HIGH (ref 11.6–15.2)
SMEAR REVIEW: NONE SEEN

## 2013-10-23 LAB — CBC WITH DIFFERENTIAL/PLATELET
BASOS ABS: 0 10*3/uL (ref 0.0–0.1)
Basophils Relative: 0 % (ref 0–1)
EOS PCT: 2 % (ref 0–5)
Eosinophils Absolute: 0.2 10*3/uL (ref 0.0–0.7)
HCT: 29.9 % — ABNORMAL LOW (ref 39.0–52.0)
Hemoglobin: 10.9 g/dL — ABNORMAL LOW (ref 13.0–17.0)
LYMPHS PCT: 19 % (ref 12–46)
Lymphs Abs: 1.9 10*3/uL (ref 0.7–4.0)
MCH: 31.8 pg (ref 26.0–34.0)
MCHC: 36.5 g/dL — ABNORMAL HIGH (ref 30.0–36.0)
MCV: 87.2 fL (ref 78.0–100.0)
Monocytes Absolute: 1.2 10*3/uL — ABNORMAL HIGH (ref 0.1–1.0)
Monocytes Relative: 12 % (ref 3–12)
NEUTROS ABS: 6.6 10*3/uL (ref 1.7–7.7)
Neutrophils Relative %: 67 % (ref 43–77)
PLATELETS: 132 10*3/uL — AB (ref 150–400)
RBC: 3.43 MIL/uL — ABNORMAL LOW (ref 4.22–5.81)
RDW: 14.6 % (ref 11.5–15.5)
WBC: 9.8 10*3/uL (ref 4.0–10.5)

## 2013-10-23 LAB — DIC (DISSEMINATED INTRAVASCULAR COAGULATION)PANEL
D-Dimer, Quant: 9.76 ug/mL-FEU — ABNORMAL HIGH (ref 0.00–0.48)
D-Dimer, Quant: 15.5 ug/mL-FEU — ABNORMAL HIGH (ref 0.00–0.48)
Fibrinogen: 106 mg/dL — ABNORMAL LOW (ref 204–475)
INR: 2.13 — ABNORMAL HIGH (ref 0.00–1.49)
Platelets: 93 10*3/uL — ABNORMAL LOW (ref 150–400)
Prothrombin Time: 24 seconds — ABNORMAL HIGH (ref 11.6–15.2)
Smear Review: NONE SEEN
aPTT: 44 seconds — ABNORMAL HIGH (ref 24–37)
aPTT: 51 seconds — ABNORMAL HIGH (ref 24–37)

## 2013-10-23 LAB — TYPE AND SCREEN
ABO/RH(D): A POS
ANTIBODY SCREEN: NEGATIVE

## 2013-10-23 LAB — D-DIMER, QUANTITATIVE (NOT AT ARMC): D DIMER QUANT: 5.7 ug{FEU}/mL — AB (ref 0.00–0.48)

## 2013-10-23 LAB — PROTIME-INR
INR: 2.05 — AB (ref 0.00–1.49)
PROTHROMBIN TIME: 23.3 s — AB (ref 11.6–15.2)

## 2013-10-23 LAB — HEPATITIS C GENOTYPE

## 2013-10-23 LAB — FIBRINOGEN: FIBRINOGEN: 132 mg/dL — AB (ref 204–475)

## 2013-10-23 MED ORDER — LIDOCAINE HCL 1 % IJ SOLN
INTRAMUSCULAR | Status: AC
  Filled 2013-10-23: qty 20

## 2013-10-23 MED ORDER — FENTANYL CITRATE 0.05 MG/ML IJ SOLN
INTRAMUSCULAR | Status: AC | PRN
  Administered 2013-10-23 (×2): 25 ug via INTRAVENOUS

## 2013-10-23 MED ORDER — LIDOCAINE-EPINEPHRINE 2 %-1:100000 IJ SOLN
INTRAMUSCULAR | Status: AC
  Filled 2013-10-23: qty 1

## 2013-10-23 MED ORDER — CEFAZOLIN SODIUM-DEXTROSE 2-3 GM-% IV SOLR
2.0000 g | Freq: Once | INTRAVENOUS | Status: AC
  Administered 2013-10-23: 2 g via INTRAVENOUS

## 2013-10-23 MED ORDER — ALBUMIN HUMAN 25 % IV SOLN
25.0000 g | Freq: Once | INTRAVENOUS | Status: AC
  Administered 2013-10-23: 25 g via INTRAVENOUS

## 2013-10-23 MED ORDER — SODIUM CHLORIDE 0.9 % IV SOLN: Freq: Once | INTRAVENOUS | Status: DC

## 2013-10-23 MED ORDER — CEFAZOLIN SODIUM-DEXTROSE 2-3 GM-% IV SOLR
INTRAVENOUS | Status: AC
  Administered 2013-10-23: 2 g via INTRAVENOUS
  Filled 2013-10-23: qty 50

## 2013-10-23 MED ORDER — LACTULOSE 10 GM/15ML PO SOLN
30.0000 g | Freq: Every day | ORAL | Status: DC
  Administered 2013-10-24 – 2013-10-28 (×5): 30 g via ORAL
  Filled 2013-10-23 (×6): qty 45

## 2013-10-23 MED ORDER — ALBUMIN HUMAN 25 % IV SOLN
INTRAVENOUS | Status: AC
  Filled 2013-10-23: qty 50

## 2013-10-23 MED ORDER — MIDAZOLAM HCL 2 MG/2ML IJ SOLN
INTRAMUSCULAR | Status: AC
  Filled 2013-10-23: qty 8

## 2013-10-23 MED ORDER — MIDAZOLAM HCL 2 MG/2ML IJ SOLN
INTRAMUSCULAR | Status: AC | PRN
  Administered 2013-10-23: 1 mg via INTRAVENOUS
  Administered 2013-10-23: 0.5 mg via INTRAVENOUS

## 2013-10-23 MED ORDER — FENTANYL CITRATE 0.05 MG/ML IJ SOLN
INTRAMUSCULAR | Status: AC
  Filled 2013-10-23: qty 6

## 2013-10-23 MED ORDER — SODIUM CHLORIDE 0.9 % IV SOLN
INTRAVENOUS | Status: DC
  Administered 2013-10-23: 13:00:00 via INTRAVENOUS

## 2013-10-23 MED ORDER — VANCOMYCIN HCL IN DEXTROSE 1-5 GM/200ML-% IV SOLN
1000.0000 mg | Freq: Two times a day (BID) | INTRAVENOUS | Status: AC
  Administered 2013-10-24 (×2): 1000 mg via INTRAVENOUS
  Filled 2013-10-23 (×2): qty 200

## 2013-10-23 MED ORDER — METOLAZONE 5 MG PO TABS
5.0000 mg | ORAL_TABLET | Freq: Two times a day (BID) | ORAL | Status: AC
Start: 2013-10-23 — End: 2013-10-24
  Administered 2013-10-23: 5 mg via ORAL
  Filled 2013-10-23 (×2): qty 1

## 2013-10-23 MED ORDER — SPIRONOLACTONE 25 MG PO TABS
25.0000 mg | ORAL_TABLET | Freq: Two times a day (BID) | ORAL | Status: AC
  Administered 2013-10-24: 25 mg via ORAL
  Filled 2013-10-23 (×2): qty 1

## 2013-10-23 MED ORDER — FUROSEMIDE 80 MG PO TABS
80.0000 mg | ORAL_TABLET | Freq: Two times a day (BID) | ORAL | Status: DC
  Administered 2013-10-24: 80 mg via ORAL
  Filled 2013-10-23 (×5): qty 1

## 2013-10-23 NOTE — Procedures (Signed)
Interventional Radiology Procedure Note  Procedure: Peritoneo-venous shunt ([place] shunt) placement for management of ascites and related hepatic hydrothorax Intra-procedural paracentesis and right thoracentesis Findings:  Peritoneal limb in the abdomen, and venous limb in the atrium.  Shunt pump on the lower right thorax.  Complications: No immediate Recommendations:  - DIC panel at 4 hours, 8 hours, 16 hours, for monitoring of DIC  - Pain control -Patient must pump the shunt pump every 3-4 hours x 10-20 pumps in the supine position to assist fluid flow and assure that the pump does not occlude with protein/debris - continue previous diet   Signed,  Yvone NeuJaime S. Loreta AveWagner, DO

## 2013-10-23 NOTE — Sedation Documentation (Signed)
Report called to pts nurse, Caralyn GuileSandra RN, [facility].

## 2013-10-23 NOTE — Progress Notes (Signed)
On call MD for night shift as well as day shift paged about critical Na of 121. Awaiting call back, day shift nurse made aware.    Xavier Valentine

## 2013-10-23 NOTE — Progress Notes (Signed)
CRITICAL VALUE ALERT  Critical value received:  Na 121   Date of notification:  10/23/2013   Time of notification:  0645  Critical value read back:yes  Nurse who received alert:  Carlyle Lipaherise Saree Krogh  MD notified (1st page):  Craige CottaKirby, NP  Time of first page:  [phone]  MD notified (2nd page): Rito EhrlichKrishnan  Time of second page:  Responding MD:  Awaiting call back  Time MD responded:

## 2013-10-23 NOTE — Sedation Documentation (Addendum)
Patient is resting comfortably. Dr. Loreta AveWagner notified pt is prepped and ready.

## 2013-10-23 NOTE — Progress Notes (Signed)
[facility] is here to transfer [facility].

## 2013-10-23 NOTE — Progress Notes (Signed)
Patient back from radiology at 1730 post procedure. Per Dr. Loreta AveWagner, patient is to be monitored for 1 hour post procedure and may be [facility] at that time. [facility] has been called and aware they are to be transporting patient. Short Stay RN has called Dois DavenportSandra on [facility] and given patient update. Patient resting comfortably in bed with sister at bedside. First DIC panel has been drawn by lab and frequent vital signs done per order.

## 2013-10-23 NOTE — Progress Notes (Addendum)
PROGRESS NOTE    Xavier Valentine [id number] DOB: May 23, 1960 DOA: 10/11/2013  PCP: Doris CheadleADVANI, DEEPAK, MD  HPI/Brief narrative 53 year old male with history of hep C/EtOH cirrhosis, coagulopathy, thrombocytopenia, right-sided transudative pleural effusions s/p thoracentesis x1 on 09/27/13, discharged from hospital on 10/10/13, are admitted on 10/11/13 secondary to worsening left-sided chest wall pain and noted to have large right-sided pleural effusion. During previous admission, pulmonology had recommended treating his pleural effusion with Lasix and holding of thoracentesis. Pulmonology was consulted again patient undergoing repeated right thoracentesis by IR. Palliative care consulted for goals of care on 10/13. Patient scheduled for [place] shunt placement on Thursday and he is scheduled for EGD for evaluation of varices.   Assessment/Plan:  1. 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 Pulmonology consulted and initially placed him on IV Lasix and oral Aldactone, without significant improvement. Aldactone discontinued secondary to hyperkalemia. Patient underwent multiple thoracentesis but always reaccumulated. Pulmonary advised no chest tube due to difficulty removing (hepatic hydrothorax) and consider antibiotics if declines (fever, leukocytosis). Pleural fluid culture negative to date. Underwent left thoracocentesis on 10/14. Repeat cxr shows no left sided effusion. Stable from respiratory standpoint. 2. Left sided chest pain/ 7 & 8 rib fractures: This was made worse by dyspnea from problem #1. Pain management and monitor. Improved. 3. 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 Fluid restriction of 1 liter. Follow BMP closely. Nephrology assisting. This might be a new baseline for him. 4. Hep C/EtOH cirrhosis/coagulopathy/thrombocytopenia/? Encephalopathy: Ammonia normal.  As per patient, has not had any alcohol for 2.5  months. Rifaximin stopped by GI. Continue  lactulose. Patient apparently has followup appointment with local hepatology clinic and GI and has never followed up. GI following. Patient scheduled for [place] shunt placement by IR on 10/22. EGD 10/21 for evaluation of varices found no varices. Esophageal ulcer noted.  5. History of alcohol abuse: Ativan protocol. No overt withdrawal. 6. Ascites: recent ultrasound apparently had not enough ascites to tap. Denies abdominal pain. No features suggestive of SBP. Monitor. 7. Failure to thrive: Seen by PMT. Patient wants to pursue all treatment options. DNR. 8. Hyperkalemia: Resolved. Kayexalate was given and Aldactone was stopped.  9. Distal Esophageal Ulcer: Noted on EGD. On PPI. May need repeat EGD in 8 weeks.   DVT Prophylaxis: SCD's Code Status: DO NOT RESUSCITATE Family Communication: Discussed with patient.  Disposition Plan: Home when medically stable. Anticipate discharge after shunt is placed. Will need follow up with Pulmonology/GI.   Consultants:  Pulmonology  Palliative care team-pending  Gastroenterology  IR  Procedures: Therapeutic thoracentesis 10/11, 10/12, 10/14  EGD 10/21  ENDOSCOPIC IMPRESSION:  1. Distal esophageal ulcer. Likely peptic. Status post biopsy  2. NO esophageal varices... Mild portal gastropathy  [place] Shunt Placement Planned for 10/22  Antibiotics:  None  Subjective: Denies pain. Anxious regarding procedure.   Objective: Filed Vitals:   10/22/13 1530 10/22/13 2140 10/23/13 0547 10/23/13 0624  BP: 114/65 96/52  92/57  Pulse: 74 80  80  Temp:  97.8 F (36.6 C)  97.9 F (36.6 C)  TempSrc:  Oral  Oral  Resp: 13 16  20   Height:      Weight:   83.5 kg (184 lb 1.4 oz)   SpO2: 94% 93%  94%    Intake/Output Summary (Last 24 hours) at 10/23/13 1009 Last data filed at 10/22/13 1539  Gross per 24 hour  Intake    100 ml  Output    900 ml  Net   -800  ml   Filed Weights   10/21/13 0344 10/22/13  0500 10/23/13 0547  Weight: 82.6 kg (182 lb 1.6 oz) 82.781 kg (182 lb 8 oz) 83.5 kg (184 lb 1.4 oz)     Exam:  General exam: chronically ill-looking male lying comfortably on bed.  Respiratory system: improved air entry but diminished at bases.  Cardiovascular system: S1 & S2 heard, RRR. No JVD, murmurs, gallops, clicks or pedal edema. Gastrointestinal system: Abdomen is mildly distended, soft and nontender.  Central nervous system: Alert and oriented. No focal neurological deficits.  Data Reviewed: Basic Metabolic Panel:  Recent Labs Lab 10/19/13 0358 10/20/13 0415 10/20/13 1105 10/22/13 0920 10/23/13 0519  NA 121* 123* 122* 122* 121*  K 5.3 5.1 5.1 4.5 4.3  CL 87* 86* 86* 85* 85*  CO2 26 28 28 29 27   GLUCOSE 97 113* 67* 73 94  BUN 30* 36* 35* 43* 51*  CREATININE 0.87 1.10 1.01 0.99 1.14  CALCIUM 8.1* 8.3* 8.3* 8.3* 8.1*  PHOS  --   --   --   --  4.9*   Liver Function Tests:  Recent Labs Lab 10/18/13 0441 10/19/13 0358 10/20/13 0415 10/23/13 0519  AST 55* 58* 62* 58*  ALT 36 38 41 39  ALKPHOS 97 94 95 110  BILITOT 3.1* 2.8* 3.4* 2.3*  PROT 6.9 6.8 6.9 6.5  ALBUMIN 1.8* 1.7* 1.8* 1.7*  1.6*   CBC:  Recent Labs Lab 10/22/13 0332 10/23/13 0519  WBC 9.4 9.8  NEUTROABS  --  6.6  HGB 10.7* 10.9*  HCT 30.1* 29.9*  MCV 89.3 87.2  PLT 111* 132*   BNP (last 3 results)  Recent Labs  09/02/13 0520 09/09/13 0430 10/07/13 1029  PROBNP 348.5* 383.9* 241.7*   CBG:  Recent Labs Lab 10/19/13 2109 10/20/13 0608 10/20/13 1124 10/20/13 1633 10/20/13 2136  GLUCAP 108* 75 86 126* 145*     Scheduled Meds: . clonazePAM  0.5 mg Oral QHS  . fentaNYL  25 mcg Transdermal Q72H  . folic acid  1 mg Oral Daily  . furosemide  80 mg Oral BID  . lactulose  30 g Oral Daily  . metolazone  5 mg Oral BID  . pantoprazole  40 mg Oral Q0600  . sodium chloride  3 mL Intravenous Q12H  . spironolactone  25 mg Oral BID  . tamsulosin  0.4 mg Oral Daily  . thiamine  100  mg Oral Daily   Continuous Infusions:   Principal Problem:   Recurrent right pleural effusion Active Problems:   Hyponatremia   possible encephalopathy, hepatic   Ascites due to alcoholic cirrhosis   BPH (benign prostatic hyperplasia)   Liver cirrhosis   Alcohol abuse, in remission   Hepatitis C   Acute respiratory failure with hypoxia   Weakness generalized   Palliative care encounter   Abdominal pain   Esophageal ulcer   Time spent: 20 mins   Osvaldo ShipperKRISHNAN,Gyan Cambre, MD  [facility] Pager [phone]  If 7PM-7AM, please contact night-coverage [email or web address] Password [email or web address] 10/23/2013, 10:09 AM    LOS: 12 days

## 2013-10-23 NOTE — Progress Notes (Signed)
Assessment/Plan:  1. Hyponatremia, hypervolemic- in setting of cirrhosis/ascites/anasarca. No significant improvement from yesterday > We may need to accept significant chronic hyponatremia Cont with Fluid restrict to 1 liter/day Will change to PO lasix Increase Spironolactone and metolazone, monitor K closely  2. Cirrhosis- No varices bu endo but esophageal ulcer.  DDAVP scheduled. Will ask about this. 3. Ascites/anasarca/pleaural effusion- for [place] shunt today to drain pleura  Subjective: Interval History: Endo yesterday  Objective: Vital signs in last 24 hours: Temp:  [97.6 F (36.4 C)-98.1 F (36.7 C)] 97.9 F (36.6 C) (10/22 0624) Pulse Rate:  [74-80] 80 (10/22 0624) Resp:  [12-20] 20 (10/22 0624) BP: (92-114)/(52-65) 92/57 mmHg (10/22 0624) SpO2:  [93 %-99 %] 94 % (10/22 0624) Weight:  [83.5 kg (184 lb 1.4 oz)] 83.5 kg (184 lb 1.4 oz) (10/22 0547) Weight change: 0.719 kg (1 lb 9.4 oz)  Intake/Output from previous day: 10/21 0701 - 10/22 0700 In: 100 [I.V.:100] Out: 1200 [Urine:1200] Intake/Output this shift:    General appearance: alert and cooperative GI: marked ascites Extremities: edema tr to 1+ edema  Lab Results:  Recent Labs  10/22/13 0332 10/23/13 0519  WBC 9.4 9.8  HGB 10.7* 10.9*  HCT 30.1* 29.9*  PLT 111* 132*   BMET:  Recent Labs  10/22/13 0920 10/23/13 0519  NA 122* 121*  K 4.5 4.3  CL 85* 85*  CO2 29 27  GLUCOSE 73 94  BUN 43* 51*  CREATININE 0.99 1.14  CALCIUM 8.3* 8.1*   No results found for this basename: PTH,  in the last 72 hours Iron Studies: No results found for this basename: IRON, TIBC, TRANSFERRIN, FERRITIN,  in the last 72 hours Studies/Results: No results found.  Scheduled: . clonazePAM  0.5 mg Oral QHS  . desmopressin (DDAVP) IV  25 mcg Intravenous Once  . fentaNYL  25 mcg Transdermal Q72H  . folic acid  1 mg Oral Daily  . furosemide  80 mg Intravenous BID  . lactulose  30 g Oral TID  . metolazone  2.5 mg Oral  BID  . pantoprazole  40 mg Oral Q0600  . rifaximin  550 mg Oral Q12H  . sodium chloride  3 mL Intravenous Q12H  . spironolactone  12.5 mg Oral BID  . tamsulosin  0.4 mg Oral Daily  . thiamine  100 mg Oral Daily     LOS: 12 days   Gladys Deckard C 10/23/2013,8:36 AM

## 2013-10-23 NOTE — Progress Notes (Signed)
Daily Rounding Note  10/23/2013, 8:33 AM  LOS: 12 days   SUBJECTIVE:       Continues to eat well.  Less rib pain.  Stools loose, about 5 yesterday.   OBJECTIVE:         Vital signs in last 24 hours:    Temp:  [97.6 F (36.4 C)-98.1 F (36.7 C)] 97.9 F (36.6 C) (10/22 0624) Pulse Rate:  [74-80] 80 (10/22 0624) Resp:  [12-20] 20 (10/22 0624) BP: (92-114)/(52-65) 92/57 mmHg (10/22 0624) SpO2:  [93 %-99 %] 94 % (10/22 0624) Weight:  [184 lb 1.4 oz (83.5 kg)] 184 lb 1.4 oz (83.5 kg) (10/22 0547) Last BM Date: 10/22/13 Filed Weights   10/21/13 0344 10/22/13 0500 10/23/13 0547  Weight: 182 lb 1.6 oz (82.6 kg) 182 lb 8 oz (82.781 kg) 184 lb 1.4 oz (83.5 kg)   General: looks overall better   Heart: RRR Chest: clear bil Abdomen: soft, NT, active Bs  Extremities: slight pedal/LE edema Neuro/Psych:  Oriented x 3.  Talkative.  No asterixis or limb weakness..   Intake/Output from previous day: 10/21 0701 - 10/22 0700 In: 100 [I.V.:100] Out: 1200 [Urine:1200]  Intake/Output this shift:    Lab Results:  Recent Labs  10/22/13 0332 10/23/13 0519  WBC 9.4 9.8  HGB 10.7* 10.9*  HCT 30.1* 29.9*  PLT 111* 132*   BMET  Recent Labs  10/20/13 1105 10/22/13 0920 10/23/13 0519  NA 122* 122* 121*  K 5.1 4.5 4.3  CL 86* 85* 85*  CO2 28 29 27   GLUCOSE 67* 73 94  BUN 35* 43* 51*  CREATININE 1.01 0.99 1.14  CALCIUM 8.3* 8.3* 8.1*   LFT  Recent Labs  10/23/13 0519  PROT 6.5  ALBUMIN 1.7*  1.6*  AST 58*  ALT 39  ALKPHOS 110  BILITOT 2.3*  BILIDIR 0.9*  IBILI 1.4*   PT/INR  Recent Labs  10/23/13 0519  LABPROT 23.3*  INR 2.05*   Scheduled Meds: . clonazePAM  0.5 mg Oral QHS  . desmopressin (DDAVP) IV  25 mcg Intravenous Once  . fentaNYL  25 mcg Transdermal Q72H  . folic acid  1 mg Oral Daily  . furosemide  80 mg Oral BID  . lactulose  30 g Oral TID  . metolazone  5 mg Oral BID  . pantoprazole   40 mg Oral Q0600  . sodium chloride  3 mL Intravenous Q12H  . spironolactone  25 mg Oral BID  . tamsulosin  0.4 mg Oral Daily  . thiamine  100 mg Oral Daily   Continuous Infusions:  PRN Meds:.albuterol, docusate sodium, guaiFENesin-dextromethorphan, morphine injection, ondansetron (ZOFRAN) IV, ondansetron, oxyCODONE, polyethylene glycol   ASSESMENT:   * Cirrhosis (Hep C and ETOH).  Child's-Pugh score 10, MELD 18 on 10/18.  Hepatitis C viral count 92,510; genotype 1A.  No previous antiviral treatment.   * Hepatic hydrothorax. S/p multiple thoracentesis but fluid rapidly reaccumulates. Diuretic therapy constrained by hyponatremia, hyperkalemia. Currently on IV Lasix 80 BID. Last thora (2 liters) 10/14  *  S/p EGD 10/22/13: 1. Distal esophageal ulcer. Likely peptic. Status post biopsy  2. NO esophageal varices... Mild portal gastropathy  PO protonix added 10/21.  Consider repeat EGD ~ 12/23/13   * Rib fractures: ongoing associated pain management with prn Morphine, Fentanyl patch.   * Small pericardial effusion.   * Hyponatremia. Last week aldactone discontinued but restarted along with metolazone 10/21, Lasix IV restarted last  week. The Na level lower today.    * Ascites. No SBP on 9/20 3.6 liter paracentesis. 10/7 abdominal ultrasound: not enough ascites to tap. Weight up 2 # in last 24 hours.   * Coagulopathy. 10/18 PT/INR: 22.5/1.96. S/p FFP transfusion. On 10/12/13.   * Encephalopathy? On Rifaximin and lactulose, ammonia levels consistenly normal and never abnormal. Continues to be oriented x 3 but has impulsive behavior (per PT), talkaholism, some confabulation. ? Underlying mental health disorder (OCD, bipolar, Wernicke's). Wonder if he needs the Rifaximin?  There was no acute MS confusion or somnolence at admission.   * Alcoholism and drug abuse. Claims abstinence since late 08/2013. No tox screens since 08/29/13.     PLAN   *  Will stop Rifaximin.  Once daily lactulose.    *  [place] shunt today.     Jennye MoccasinSarah Gribbin  10/23/2013, 8:33 AM Pager: [phone]  GI ATTENDING  Interval history and a reviewed. Patient seen and examined. Agree with progress note as scribed above. Biopsies have returned and revealed benign inflammatory tissue. The patient should stay on daily PPI. May need followup endoscopy in about 8 weeks to assure ulcer healing. [facility] Christ[facility] aware.  Wilhemina BonitoJohn N. Eda KeysPerry, [name], M.D. [facility]

## 2013-10-24 ENCOUNTER — Encounter: Payer: Self-pay | Admitting: Physician Assistant

## 2013-10-24 DIAGNOSIS — B1920 Unspecified viral hepatitis C without hepatic coma: Secondary | ICD-10-CM

## 2013-10-24 DIAGNOSIS — D65 Disseminated intravascular coagulation [defibrination syndrome]: Secondary | ICD-10-CM

## 2013-10-24 DIAGNOSIS — K7030 Alcoholic cirrhosis of liver without ascites: Secondary | ICD-10-CM

## 2013-10-24 LAB — CBC
HEMATOCRIT: 25.1 % — AB (ref 39.0–52.0)
HEMATOCRIT: 25.8 % — AB (ref 39.0–52.0)
HEMOGLOBIN: 9.1 g/dL — AB (ref 13.0–17.0)
Hemoglobin: 9.2 g/dL — ABNORMAL LOW (ref 13.0–17.0)
MCH: 32.3 pg (ref 26.0–34.0)
MCH: 31.4 pg (ref 26.0–34.0)
MCHC: 35.7 g/dL (ref 30.0–36.0)
MCHC: 36.3 g/dL — ABNORMAL HIGH (ref 30.0–36.0)
MCV: 90.5 fL (ref 78.0–100.0)
MCV: 86.6 fL (ref 78.0–100.0)
Platelets: 82 10*3/uL — ABNORMAL LOW (ref 150–400)
Platelets: 88 10*3/uL — ABNORMAL LOW (ref 150–400)
RBC: 2.85 MIL/uL — AB (ref 4.22–5.81)
RBC: 2.9 MIL/uL — AB (ref 4.22–5.81)
RDW: 14.4 % (ref 11.5–15.5)
RDW: 14.8 % (ref 11.5–15.5)
WBC: 7.7 10*3/uL (ref 4.0–10.5)
WBC: 7.3 10*3/uL (ref 4.0–10.5)

## 2013-10-24 LAB — DIC (DISSEMINATED INTRAVASCULAR COAGULATION) PANEL
APTT: 54 s — AB (ref 24–37)
APTT: 54 s — AB (ref 24–37)
D-Dimer, Quant: >20 ug/mL-FEU — ABNORMAL HIGH (ref 0.00–0.48)
FIBRINOGEN: 79 mg/dL — AB (ref 204–475)
INR: 2.45 — ABNORMAL HIGH (ref 0.00–1.49)
INR: 2.96 — AB (ref 0.00–1.49)
PLATELETS: 83 10*3/uL — AB (ref 150–400)
PLATELETS: 92 10*3/uL — AB (ref 150–400)
PROTHROMBIN TIME: 31 s — AB (ref 11.6–15.2)
SMEAR REVIEW: NONE SEEN
aPTT: 53 seconds — ABNORMAL HIGH (ref 24–37)

## 2013-10-24 LAB — PREPARE FRESH FROZEN PLASMA

## 2013-10-24 LAB — DIC (DISSEMINATED INTRAVASCULAR COAGULATION)PANEL
D-Dimer, Quant: >20 ug/mL-FEU — ABNORMAL HIGH (ref 0.00–0.48)
D-Dimer, Quant: >20 ug/mL-FEU — ABNORMAL HIGH (ref 0.00–0.48)
Fibrinogen: 69 mg/dL — CL (ref 204–475)
INR: 2.6 — ABNORMAL HIGH (ref 0.00–1.49)
Platelets: 96 10*3/uL — ABNORMAL LOW (ref 150–400)
Prothrombin Time: 26.8 seconds — ABNORMAL HIGH (ref 11.6–15.2)
Prothrombin Time: 28 seconds — ABNORMAL HIGH (ref 11.6–15.2)
Smear Review: NONE SEEN
Smear Review: NONE SEEN

## 2013-10-24 LAB — RENAL FUNCTION PANEL
ANION GAP: 7 (ref 5–15)
Albumin: 1.8 g/dL — ABNORMAL LOW (ref 3.5–5.2)
BUN: 43 mg/dL — ABNORMAL HIGH (ref 6–23)
CO2: 30 meq/L (ref 19–32)
Calcium: 7.4 mg/dL — ABNORMAL LOW (ref 8.4–10.5)
Chloride: 84 mEq/L — ABNORMAL LOW (ref 96–112)
Creatinine, Ser: 1.01 mg/dL (ref 0.50–1.35)
GFR calc non Af Amer: 82 mL/min — ABNORMAL LOW (ref >90)
Glucose, Bld: 111 mg/dL — ABNORMAL HIGH (ref 70–99)
Phosphorus: 2.7 mg/dL (ref 2.3–4.6)
Potassium: 4.2 mEq/L (ref 3.7–5.3)
SODIUM: 121 meq/L — AB (ref 137–147)

## 2013-10-24 MED ORDER — FUROSEMIDE 80 MG PO TABS
160.0000 mg | ORAL_TABLET | Freq: Two times a day (BID) | ORAL | Status: DC
  Administered 2013-10-24 – 2013-10-25 (×3): 160 mg via ORAL
  Filled 2013-10-24 (×6): qty 2

## 2013-10-24 MED ORDER — SODIUM CHLORIDE 0.9 % IV SOLN: Freq: Once | INTRAVENOUS | Status: DC

## 2013-10-24 MED ORDER — MORPHINE SULFATE 2 MG/ML IJ SOLN
2.0000 mg | INTRAMUSCULAR | Status: DC | PRN
  Administered 2013-10-24 – 2013-10-27 (×9): 2 mg via INTRAVENOUS
  Filled 2013-10-24 (×9): qty 1

## 2013-10-24 NOTE — Progress Notes (Signed)
Patient ID: Xavier Valentine, male   DOB: Aug 23, 1960, 53 y.o.   MRN: [id number]   Referring Physician(s): TRH  Subjective: Hepatic hydrothorax Cirrhosis/ascites [place] shunt placed with Dr Loreta Ave 10/22 Doing well in room Resting comfortably No change in mental status that is noticeable Breathing easily Did have some decrease in fibrinogen and plt on am labs Hem/onc consulted  Allergies: Bee venom  Medications: Prior to Admission medications   Medication Sig Start Date End Date Taking? Authorizing Provider  albuterol (PROVENTIL HFA;VENTOLIN HFA) 108 (90 BASE) MCG/ACT inhaler Inhale 2 puffs into the lungs daily as needed for wheezing or shortness of breath.   Yes Historical Provider, MD  clonazePAM (KLONOPIN) 0.5 MG tablet Take 1 tablet (0.5 mg total) by mouth daily as needed for anxiety. 10/09/13  Yes Leroy Sea, MD  docusate sodium (COLACE) 100 MG capsule Take 1 capsule (100 mg total) by mouth 2 (two) times daily as needed for mild constipation. Hold if you have diarrhea. 10/01/13  Yes Tora Kindred York, PA-C  folic acid (FOLVITE) 1 MG tablet Take 1 tablet (1 mg total) by mouth daily. 09/09/13  Yes Clydia Llano, MD  furosemide (LASIX) 40 MG tablet Take 2 tablets (80 mg total) by mouth 2 (two) times daily. 10/10/13  Yes Leroy Sea, MD  lactulose (CHRONULAC) 10 GM/15ML solution Take 45 mLs (30 g total) by mouth daily. 10/01/13  Yes Marianne L York, PA-C  Multiple Vitamin (MULTIVITAMIN WITH MINERALS) TABS tablet Take 1 tablet by mouth daily. 09/09/13  Yes Clydia Llano, MD  oxyCODONE (ROXICODONE) 5 MG immediate release tablet Take 1 tablet (5 mg total) by mouth every 4 (four) hours as needed for severe pain. 10/09/13  Yes Leroy Sea, MD  spironolactone (ALDACTONE) 100 MG tablet Take 1 tablet (100 mg total) by mouth 2 (two) times daily. 10/10/13  Yes Leroy Sea, MD  tamsulosin (FLOMAX) 0.4 MG CAPS capsule Take 1 capsule (0.4 mg total) by mouth daily. 09/23/13  Yes Tora Kindred York,  PA-C  thiamine 100 MG tablet Take 1 tablet (100 mg total) by mouth daily. 09/09/13  Yes Clydia Llano, MD    Review of Systems  Vital Signs: BP 105/54  Pulse 80  Temp(Src) 98.8 F (37.1 C) (Oral)  Resp 18  Ht 5\' 11"  (1.803 m)  Wt 80.377 kg (177 lb 3.2 oz)  BMI 24.73 kg/m2  SpO2 91%  Physical Exam  Pulmonary/Chest: Effort normal. No respiratory distress. He has wheezes.  Abdominal: Soft. Bowel sounds are normal. He exhibits distension.  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 Not a candidate for TIPS.  EXAM: ULTRASOUND GUIDED PERITONEAL ACCESS FOR PARACENTESIS.  ULTRASOUND-GUIDED PLEURAL ACCESS FOR THORACENTESIS.  ULTRASOUND GUIDED RIGHT INTERNAL JUGULAR ACCESS FOR SHUNT PLACEMENT.  DENVER SHUNT (PERITONEAL TO VENOUS) TUNNELED CATHETER -- 40981 -- WITH COMPLICATING MEDICAL CONDITIONS.  CLINICAL DATA:  53 year old male with a history of hepatitis-C and alcoholic cirrhosis. He has been admitted on October 12 with decompensated recurrent ascites. The patient has a MELD score of at least 18, with Child Pugh category C cirrhosis/liver disease, with mild encephalopathy, and is not a candidate for TIPS.  He has been failing medical therapy for recurrent ascites and a right-sided hepatic hydrothorax. He has become hyponatremic with multiple  medical problems.  Previous right-sided thoracentesis has yielded 1.5 L October 13, 2013 and 2.0 L October 15, 2013.  Complicating medical history includes cirrhosis, ascites, thrombocytopenia, coagulopathy, hepatitis-C, prior inguinal hernia and chronic alcoholism.  MEDICATIONS: Fentanyl 50 mcg IV; Versed 1.5 mg IV  ANESTHESIA/SEDATION: Sedation Time  116 minutes  Fluoro time: 36 seconds  CONTRAST:  None  COMPLICATIONS: None immediate.  PROCEDURE: Informed  consent was obtained from the patient following an explanation of the procedure, risks, benefits and alternatives. Risks discussed included bleeding, infection, sepsis, shunt failure, requirement for shunt replacement, additional procedures, disseminated intravascular coagulopathy/coagulation (DIC), cardiopulmonary collapse, death. The patient understands, agrees and consents for the procedure. All questions were addressed. A time out was performed prior to the initiation of the procedure. The patient was positioned supine on the fluoroscopy table for access to both the right pleural space and the peritoneal space, as well as the right internal jugular vein. The operative site was prepped and draped in the usual sterile fashion.  Ultrasound survey of the abdomen was performed with images stored and sent to PACs.  A suitable approach into the peritoneum was determined for placement of a Yueh needle. The needle was advanced into the peritoneum and ascitic fluid was returned. A 0.035 wire was advanced and view needle was removed. Serial dilation with 12 French dilator was performed before placing a 16 French peel-away sheath. The peel-away sheath was then attached to vacuum aspiration for paracentesis.  Ultrasound guided as placement of a second Yueh needle was then performed into the right pleural space. Once the needle was removed the catheter was attached to vacuum aspiration for large volume thoracentesis.  Ultrasound survey of the right internal jugular vein was then performed with images stored and sent to PACs. A micropuncture needle was then used access the right internal jugular vein after infiltration of the skin and subcutaneous tissues with 1% lidocaine without epinephrine. Once we have established access to the right internal jugular vein, serial dilation of the tract with 12 [place] dilator was performed, with subsequent placement of 16 French peel-away sheath.  A site on the right lower thorax at the  costochondral junction was selected for placement of the shunt pump. A 3 cm incision was performed, with blunt dissection used to create pocket for the pump.  A 2 L bag of body-temperature saline was then infused into the peritoneal space after complete evacuation of the peritoneal ascites. This fluid was then partially evacuated with vacuum suction, with approximately 500 cc of saline remaining.  The peritoneal limb of the shunt catheter was then back tunneled from the pocket towards the puncture site on the right upper quadrant. The catheter was pulled through the tract and the tunnel were removed. The catheter was then placed through the peel-away sheath and the peel-away sheath was removed.  The shunt pump was then positioned in the pocket, with 2 Prolene retention sutures placed. The venous limb was tunneled along the chest wall after infiltration of the subcutaneous tissues. The catheter was pulled through the neck puncture site. The required length of the venous limb was determined by placing a J wire through the peel-away sheath and measuring to the right atrium.  The venous limb was amputated at the proper length, and then the entire shunt catheter was primed with the remaining ascitic fluid/saline in the abdomen.  The venous limb was placed through the peel-away at the right IJ and into the right atrium.  A final image was stored.  The  shunt pump pocket was closed with a deep layer of Vicryl resorbable suture, a writing subcuticular layer with Monocryl, and interrupted Prolene sutures to assure that the wound does not dehisce with the pressure of pumping the catheter. The peritoneal insertion site was closed with a Vicryl deep suture an a Monocryl suture as well as Derma bond, and the neck incision site was sealed with Dermabond.  The patient tolerated the procedure well and remained hemodynamically stable throughout.  No complications were encountered and no significant blood loss was encountered.   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  PLAN: The patient will need to pump the shunt pump as instructed 20 times in the morning and 20 times in the evening. It is best in the short term if this is done at least every 3-4 hours to assure that there is no debris within the shunt tubing and shunt pump.  A DIC panel will be ordered every 4 hours starting at 9 p.m. on the day of the procedure to assure that the patient does not become coagulopathic. The highest risk is within the first 24 hours.  The patient will receive 7 days of antibiotics. The first 24 hours may be IV.  Pain control.  Follow-up appointment in 7-14 days after discharge with Dr. Loreta Ave in [facility]. Patient may require occasional repeat thoracentesis over the next several weeks. This need should decrease with successful use of the shunt.  Signed,  Yvone Neu. Loreta Ave, DO  [facility]  [facility]   Electronically Signed   By: Gilmer Mor D.O.   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 Not a candidate for TIPS.  EXAM: ULTRASOUND GUIDED PERITONEAL ACCESS FOR PARACENTESIS.  ULTRASOUND-GUIDED PLEURAL ACCESS FOR THORACENTESIS.  ULTRASOUND GUIDED RIGHT INTERNAL JUGULAR ACCESS FOR SHUNT PLACEMENT.  DENVER SHUNT (PERITONEAL TO VENOUS) TUNNELED CATHETER -- 16109 -- WITH COMPLICATING MEDICAL CONDITIONS.  CLINICAL DATA:  53 year old male with a history of hepatitis-C and alcoholic cirrhosis. He has been admitted on October 12 with decompensated recurrent ascites. The patient has a MELD score of at least 18, with Child Pugh category C cirrhosis/liver disease, with mild encephalopathy, and is not a candidate for TIPS.  He has been failing medical therapy for recurrent ascites  and a right-sided hepatic hydrothorax. He has become hyponatremic with multiple medical problems.  Previous right-sided thoracentesis has yielded 1.5 L October 13, 2013 and 2.0 L October 15, 2013.  Complicating medical history includes cirrhosis, ascites, thrombocytopenia, coagulopathy, hepatitis-C, prior inguinal hernia and chronic alcoholism.  MEDICATIONS: Fentanyl 50 mcg IV; Versed 1.5 mg IV  ANESTHESIA/SEDATION: Sedation Time  116 minutes  Fluoro time: 36 seconds  CONTRAST:  None  COMPLICATIONS: None immediate.  PROCEDURE: Informed consent was obtained from the patient following an explanation of the procedure, risks, benefits and alternatives. Risks discussed included bleeding, infection, sepsis, shunt failure, requirement for shunt replacement, additional procedures, disseminated intravascular coagulopathy/coagulation (DIC), cardiopulmonary collapse, death. The patient understands, agrees and consents for the procedure. All questions were addressed. A time out was performed prior to the initiation of the procedure. The patient was positioned supine on the fluoroscopy table for access to both the right pleural space and the peritoneal space, as well as the right internal jugular vein. The operative site was prepped and draped in the  usual sterile fashion.  Ultrasound survey of the abdomen was performed with images stored and sent to PACs.  A suitable approach into the peritoneum was determined for placement of a Yueh needle. The needle was advanced into the peritoneum and ascitic fluid was returned. A 0.035 wire was advanced and view needle was removed. Serial dilation with 12 French dilator was performed before placing a 16 French peel-away sheath. The peel-away sheath was then attached to vacuum aspiration for paracentesis.  Ultrasound guided as placement of a second Yueh needle was then performed into the right pleural space. Once the needle was removed the catheter was attached to vacuum aspiration for  large volume thoracentesis.  Ultrasound survey of the right internal jugular vein was then performed with images stored and sent to PACs. A micropuncture needle was then used access the right internal jugular vein after infiltration of the skin and subcutaneous tissues with 1% lidocaine without epinephrine. Once we have established access to the right internal jugular vein, serial dilation of the tract with 12 [place] dilator was performed, with subsequent placement of 16 French peel-away sheath.  A site on the right lower thorax at the costochondral junction was selected for placement of the shunt pump. A 3 cm incision was performed, with blunt dissection used to create pocket for the pump.  A 2 L bag of body-temperature saline was then infused into the peritoneal space after complete evacuation of the peritoneal ascites. This fluid was then partially evacuated with vacuum suction, with approximately 500 cc of saline remaining.  The peritoneal limb of the shunt catheter was then back tunneled from the pocket towards the puncture site on the right upper quadrant. The catheter was pulled through the tract and the tunnel were removed. The catheter was then placed through the peel-away sheath and the peel-away sheath was removed.  The shunt pump was then positioned in the pocket, with 2 Prolene retention sutures placed. The venous limb was tunneled along the chest wall after infiltration of the subcutaneous tissues. The catheter was pulled through the neck puncture site. The required length of the venous limb was determined by placing a J wire through the peel-away sheath and measuring to the right atrium.  The venous limb was amputated at the proper length, and then the entire shunt catheter was primed with the remaining ascitic fluid/saline in the abdomen.  The venous limb was placed through the peel-away at the right IJ and into the right atrium.  A final image was stored.  The shunt pump pocket was closed with a  deep layer of Vicryl resorbable suture, a writing subcuticular layer with Monocryl, and interrupted Prolene sutures to assure that the wound does not dehisce with the pressure of pumping the catheter. The peritoneal insertion site was closed with a Vicryl deep suture an a Monocryl suture as well as Derma bond, and the neck incision site was sealed with Dermabond.  The patient tolerated the procedure well and remained hemodynamically stable throughout.  No complications were encountered and no significant blood loss was encountered.  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  PLAN: The patient will need to pump the shunt pump as instructed 20 times in the morning and 20 times in the evening. It is best in the short term if this is done at least every 3-4 hours to assure that there is no debris within the shunt tubing and shunt pump.  A  DIC panel will be ordered every 4 hours starting at 9 p.m. on the day of the procedure to assure that the patient does not become coagulopathic. The highest risk is within the first 24 hours.  The patient will receive 7 days of antibiotics. The first 24 hours may be IV.  Pain control.  Follow-up appointment in 7-14 days after discharge with Dr. Loreta AveWagner in [facility]. Patient may require occasional repeat thoracentesis over the next several weeks. This need should decrease with successful use of the shunt.  Signed,  Yvone NeuJaime S. Loreta AveWagner, DO  [facility]  [facility]   Electronically Signed   By: Gilmer MorJaime  Wagner D.O.   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 Not a candidate for TIPS.  EXAM: ULTRASOUND GUIDED PERITONEAL ACCESS FOR PARACENTESIS.  ULTRASOUND-GUIDED PLEURAL ACCESS FOR THORACENTESIS.  ULTRASOUND GUIDED  RIGHT INTERNAL JUGULAR ACCESS FOR SHUNT PLACEMENT.  DENVER SHUNT (PERITONEAL TO VENOUS) TUNNELED CATHETER -- [id number] -- WITH COMPLICATING MEDICAL CONDITIONS.  CLINICAL DATA:  53 year old male with a history of hepatitis-C and alcoholic cirrhosis. He has been admitted on October 12 with decompensated recurrent ascites. The patient has a MELD score of at least 18, with Child Pugh category C cirrhosis/liver disease, with mild encephalopathy, and is not a candidate for TIPS.  He has been failing medical therapy for recurrent ascites and a right-sided hepatic hydrothorax. He has become hyponatremic with multiple medical problems.  Previous right-sided thoracentesis has yielded 1.5 L October 13, 2013 and 2.0 L October 15, 2013.  Complicating medical history includes cirrhosis, ascites, thrombocytopenia, coagulopathy, hepatitis-C, prior inguinal hernia and chronic alcoholism.  MEDICATIONS: Fentanyl 50 mcg IV; Versed 1.5 mg IV  ANESTHESIA/SEDATION: Sedation Time  116 minutes  Fluoro time: 36 seconds  CONTRAST:  None  COMPLICATIONS: None immediate.  PROCEDURE: Informed consent was obtained from the patient following an explanation of the procedure, risks, benefits and alternatives. Risks discussed included bleeding, infection, sepsis, shunt failure, requirement for shunt replacement, additional procedures, disseminated intravascular coagulopathy/coagulation (DIC), cardiopulmonary collapse, death. The patient understands, agrees and consents for the procedure. All questions were addressed. A time out was performed prior to the initiation of the procedure. The patient was positioned supine on the fluoroscopy table for access to both the right pleural space and the peritoneal space, as well as the right internal jugular vein. The operative site was prepped and draped in the usual sterile fashion.  Ultrasound survey of the abdomen was performed with images stored and sent to PACs.  A suitable approach into the peritoneum was  determined for placement of a Yueh needle. The needle was advanced into the peritoneum and ascitic fluid was returned. A 0.035 wire was advanced and view needle was removed. Serial dilation with 12 French dilator was performed before placing a 16 French peel-away sheath. The peel-away sheath was then attached to vacuum aspiration for paracentesis.  Ultrasound guided as placement of a second Yueh needle was then performed into the right pleural space. Once the needle was removed the catheter was attached to vacuum aspiration for large volume thoracentesis.  Ultrasound survey of the right internal jugular vein was then performed with images stored and sent to PACs. A micropuncture needle was then used access the right internal jugular vein after infiltration of the skin and subcutaneous tissues with 1% lidocaine without epinephrine. Once we have established access to the right internal jugular  vein, serial dilation of the tract with 12 French dilator was performed, with subsequent placement of 16 French peel-away sheath.  A site on the right lower thorax at the costochondral junction was selected for placement of the shunt pump. A 3 cm incision was performed, with blunt dissection used to create pocket for the pump.  A 2 L bag of body-temperature saline was then infused into the peritoneal space after complete evacuation of the peritoneal ascites. This fluid was then partially evacuated with vacuum suction, with approximately 500 cc of saline remaining.  The peritoneal limb of the shunt catheter was then back tunneled from the pocket towards the puncture site on the right upper quadrant. The catheter was pulled through the tract and the tunnel were removed. The catheter was then placed through the peel-away sheath and the peel-away sheath was removed.  The shunt pump was then positioned in the pocket, with 2 Prolene retention sutures placed. The venous limb was tunneled along the chest wall after infiltration of the  subcutaneous tissues. The catheter was pulled through the neck puncture site. The required length of the venous limb was determined by placing a J wire through the peel-away sheath and measuring to the right atrium.  The venous limb was amputated at the proper length, and then the entire shunt catheter was primed with the remaining ascitic fluid/saline in the abdomen.  The venous limb was placed through the peel-away at the right IJ and into the right atrium.  A final image was stored.  The shunt pump pocket was closed with a deep layer of Vicryl resorbable suture, a writing subcuticular layer with Monocryl, and interrupted Prolene sutures to assure that the wound does not dehisce with the pressure of pumping the catheter. The peritoneal insertion site was closed with a Vicryl deep suture an a Monocryl suture as well as Derma bond, and the neck incision site was sealed with Dermabond.  The patient tolerated the procedure well and remained hemodynamically stable throughout.  No complications were encountered and no significant blood loss was encountered.  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  PLAN: The patient will need to pump the shunt pump as instructed 20 times in the morning and 20 times in the evening. It is best in the short term if this is done at least every 3-4 hours to assure that there is no debris within the shunt tubing and shunt pump.  A DIC panel will be ordered every 4 hours starting at 9 p.m. on the day of the procedure to assure that the patient does not become coagulopathic. The highest risk is within the first 24 hours.  The patient will receive 7 days of antibiotics. The first 24 hours may be IV.  Pain control.  Follow-up appointment in 7-14 days after discharge with Dr. Loreta Ave in [facility]. Patient may require occasional repeat thoracentesis over the  next several weeks. This need should decrease with successful use of the shunt.  Signed,  Yvone Neu. Loreta Ave, DO  [facility]  [facility]   Electronically Signed   By: Gilmer Mor D.O.   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 Not a candidate for TIPS.  EXAM: ULTRASOUND GUIDED PERITONEAL ACCESS FOR PARACENTESIS.  ULTRASOUND-GUIDED PLEURAL ACCESS FOR THORACENTESIS.  ULTRASOUND GUIDED RIGHT  INTERNAL JUGULAR ACCESS FOR SHUNT PLACEMENT.  DENVER SHUNT (PERITONEAL TO VENOUS) TUNNELED CATHETER -- [id number] -- WITH COMPLICATING MEDICAL CONDITIONS.  CLINICAL DATA:  53 year old male with a history of hepatitis-C and alcoholic cirrhosis. He has been admitted on October 12 with decompensated recurrent ascites. The patient has a MELD score of at least 18, with Child Pugh category C cirrhosis/liver disease, with mild encephalopathy, and is not a candidate for TIPS.  He has been failing medical therapy for recurrent ascites and a right-sided hepatic hydrothorax. He has become hyponatremic with multiple medical problems.  Previous right-sided thoracentesis has yielded 1.5 L October 13, 2013 and 2.0 L October 15, 2013.  Complicating medical history includes cirrhosis, ascites, thrombocytopenia, coagulopathy, hepatitis-C, prior inguinal hernia and chronic alcoholism.  MEDICATIONS: Fentanyl 50 mcg IV; Versed 1.5 mg IV  ANESTHESIA/SEDATION: Sedation Time  116 minutes  Fluoro time: 36 seconds  CONTRAST:  None  COMPLICATIONS: None immediate.  PROCEDURE: Informed consent was obtained from the patient following an explanation of the procedure, risks, benefits and alternatives. Risks discussed included bleeding, infection, sepsis, shunt failure, requirement for shunt replacement, additional procedures, disseminated intravascular coagulopathy/coagulation (DIC), cardiopulmonary  collapse, death. The patient understands, agrees and consents for the procedure. All questions were addressed. A time out was performed prior to the initiation of the procedure. The patient was positioned supine on the fluoroscopy table for access to both the right pleural space and the peritoneal space, as well as the right internal jugular vein. The operative site was prepped and draped in the usual sterile fashion.  Ultrasound survey of the abdomen was performed with images stored and sent to PACs.  A suitable approach into the peritoneum was determined for placement of a Yueh needle. The needle was advanced into the peritoneum and ascitic fluid was returned. A 0.035 wire was advanced and view needle was removed. Serial dilation with 12 French dilator was performed before placing a 16 French peel-away sheath. The peel-away sheath was then attached to vacuum aspiration for paracentesis.  Ultrasound guided as placement of a second Yueh needle was then performed into the right pleural space. Once the needle was removed the catheter was attached to vacuum aspiration for large volume thoracentesis.  Ultrasound survey of the right internal jugular vein was then performed with images stored and sent to PACs. A micropuncture needle was then used access the right internal jugular vein after infiltration of the skin and subcutaneous tissues with 1% lidocaine without epinephrine. Once we have established access to the right internal jugular vein, serial dilation of the tract with 12 [place] dilator was performed, with subsequent placement of 16 French peel-away sheath.  A site on the right lower thorax at the costochondral junction was selected for placement of the shunt pump. A 3 cm incision was performed, with blunt dissection used to create pocket for the pump.  A 2 L bag of body-temperature saline was then infused into the peritoneal space after complete evacuation of the peritoneal ascites. This fluid was then partially  evacuated with vacuum suction, with approximately 500 cc of saline remaining.  The peritoneal limb of the shunt catheter was then back tunneled from the pocket towards the puncture site on the right upper quadrant. The catheter was pulled through the tract and the tunnel were removed. The catheter was then placed through the peel-away sheath and the peel-away sheath was removed.  The shunt pump was then positioned in the pocket, with 2 Prolene retention sutures placed. The venous limb was tunneled along  the chest wall after infiltration of the subcutaneous tissues. The catheter was pulled through the neck puncture site. The required length of the venous limb was determined by placing a J wire through the peel-away sheath and measuring to the right atrium.  The venous limb was amputated at the proper length, and then the entire shunt catheter was primed with the remaining ascitic fluid/saline in the abdomen.  The venous limb was placed through the peel-away at the right IJ and into the right atrium.  A final image was stored.  The shunt pump pocket was closed with a deep layer of Vicryl resorbable suture, a writing subcuticular layer with Monocryl, and interrupted Prolene sutures to assure that the wound does not dehisce with the pressure of pumping the catheter. The peritoneal insertion site was closed with a Vicryl deep suture an a Monocryl suture as well as Derma bond, and the neck incision site was sealed with Dermabond.  The patient tolerated the procedure well and remained hemodynamically stable throughout.  No complications were encountered and no significant blood loss was encountered.  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  PLAN: The patient will need to pump the shunt pump as instructed 20 times in the morning and 20 times in the evening. It is best in the short term if this is done at least every  3-4 hours to assure that there is no debris within the shunt tubing and shunt pump.  A DIC panel will be ordered every 4 hours starting at 9 p.m. on the day of the procedure to assure that the patient does not become coagulopathic. The highest risk is within the first 24 hours.  The patient will receive 7 days of antibiotics. The first 24 hours may be IV.  Pain control.  Follow-up appointment in 7-14 days after discharge with Dr. Loreta Ave in [facility]. Patient may require occasional repeat thoracentesis over the next several weeks. This need should decrease with successful use of the shunt.  Signed,  Yvone Neu. Loreta Ave, DO  [facility]  [facility]   Electronically Signed   By: Gilmer Mor D.O.   On: 10/23/2013 19:42    Labs:  CBC:  Recent Labs  10/22/13 0332 10/23/13 0519 10/23/13 1810 10/23/13 2125 10/24/13 0046 10/24/13 0337  WBC 9.4 9.8  --   --  7.3 7.7  HGB 10.7* 10.9*  --   --  9.2* 9.1*  HCT 30.1* 29.9*  --   --  25.8* 25.1*  PLT 111* 132* 93* 94* 83*  82* 92*  88*    COAGS:  Recent Labs  10/23/13 1810 10/23/13 2125 10/24/13 0046 10/24/13 0337  INR 2.13* 2.13* 2.45* 2.60*  APTT 44* 51* 54* 54*    BMP:  Recent Labs  10/20/13 1105 10/22/13 0920 10/23/13 0519 10/24/13 0824  NA 122* 122* 121* 121*  K 5.1 4.5 4.3 4.2  CL 86* 85* 85* 84*  CO2 28 29 27 30   GLUCOSE 67* 73 94 111*  BUN 35* 43* 51* 43*  CALCIUM 8.3* 8.3* 8.1* 7.4*  CREATININE 1.01 0.99 1.14 1.01  GFRNONAA 82* >90 71* 82*  GFRAA >90 >90 82* >90    LIVER FUNCTION TESTS:  Recent Labs  10/18/13 0441 10/19/13 0358 10/20/13 0415 10/23/13 0519 10/24/13 0824  BILITOT 3.1* 2.8* 3.4* 2.3*  --   AST 55* 58* 62* 58*  --  ALT 36 38 41 39  --   ALKPHOS 97 94 95 110  --   PROT 6.9 6.8 6.9 6.5  --   ALBUMIN 1.8* 1.7* 1.8* 1.7*  1.6* 1.8*    Assessment and Plan:  Post [place] shunt placement Doing well Noted drop  in plt and fibrinogen Appreciate Hem/Onc input Will follow    I spent a total of 20 minutes face to face in clinical consultation/evaluation, greater than 50% of which was counseling/coordinating care for [facility] shunt placement  Signed: Asyia Hornung A 10/24/2013, 4:04 PM

## 2013-10-24 NOTE — Progress Notes (Signed)
[facility] paged about critical fibrinogen of 79. Waiting for response.

## 2013-10-24 NOTE — Progress Notes (Signed)
Physical Therapy Treatment Patient Details Name: Xavier Valentine MRN: [id number] DOB: [birth year] Today'Valentine Date: 10/24/2013    History of Present Illness Right-sided chest pain or shortness of breath and cough. 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 Thoracentesis on 9/26 and 10/12, 10/14, 10/16. Valentine/p [place] shunt placement 10/22.    PT Comments    Patient is progressing towards physical therapy goals. SpO2 drops from 95% at rest to 85% while ambulating on room air; quickly rises again once sitting with cues for pursed lip breathing. Pt also with mild stagger during gait training, requiring close guard assist and improved by use of a rolling walker. Patient will continue to benefit from skilled physical therapy services to further improve independence with functional mobility.    Follow Up Recommendations  Home health PT;Supervision/Assistance - 24 hour     Equipment Recommendations   (TBD based on progression of balance)    Recommendations for Other Services       Precautions / Restrictions Precautions Precautions: Fall Precaution Comments: watch sats Restrictions Weight Bearing Restrictions: No    Mobility  Bed Mobility Overal bed mobility: Modified Independent                Transfers Overall transfer level: Needs assistance Equipment used: None;Rolling walker (2 wheeled) Transfers: Sit to/from Stand Sit to Stand: Supervision         General transfer comment: Supervision to stand, somewhat impulsive. Mild sway noted without assistive device. Corrected with RW for stability. Performed from lowest bed setting x3.  Ambulation/Gait Ambulation/Gait assistance: Min guard Ambulation  Distance (Feet): 110 Feet (x2) Assistive device: Rolling walker (2 wheeled);None Gait Pattern/deviations: Step-through pattern;Decreased dorsiflexion - left;Staggering right     General Gait Details: Trail without assistive device. pt with intermittent staggering towards his Rt and left, with occasional decreased Lt foot dorsiflexion. he is able to self correct however pt ambulates much more safely with hands on a rolling walker. Very talkative and sats dropped to 85% while on room air during ambulation - quickly returned to 95-96% once sitting and practicing pursed lip breathing. This trend was noted during both bouts of ambulation.   Stairs            Wheelchair Mobility    Modified Rankin (Stroke Patients Only)       Balance                                    Cognition Arousal/Alertness: Awake/alert Behavior During Therapy: WFL for tasks assessed/performed Overall Cognitive Status: Within Functional Limits for tasks assessed                      Exercises      General Comments        Pertinent Vitals/Pain Pain Assessment: 0-10 Pain Score:  ("shooting from 0-9") Pain Location: Rt flank Pain Descriptors / Indicators: Shooting Pain Intervention(Valentine): Monitored during session;Premedicated before session;Repositioned    Home Living                      Prior Function            PT Goals (  current goals can now be found in the care plan section) Acute Rehab PT Goals PT Goal Formulation: With patient Time For Goal Achievement: 10/27/13 Potential to Achieve Goals: Fair Progress towards PT goals: Progressing toward goals    Frequency       PT Plan Current plan remains appropriate    Co-evaluation             End of Session Equipment Utilized During Treatment: Gait belt Activity Tolerance: Patient tolerated treatment well Patient left: in bed;with call bell/phone within reach     Time: [id number] PT Time Calculation  (min): 24 min  Charges:  $Gait Training: 8-22 mins $Therapeutic Activity: 8-22 mins                    G Codes:       [employer] Xavier Valentine, [place] [phone]  Xavier [place], [place] Valentine 10/24/2013, 6:41 PM

## 2013-10-24 NOTE — Progress Notes (Signed)
Assessment/Plan:  53yo male with alcoholic cirrhosis, ESLD and and recurrent pleural effusions. Nephrology consulted for chronic hyponatremia. 1. Hyponatremia, hypervolemic- in setting of cirrhosis/ascites/anasarca. No significant improvement from yesterday > We may need to accept significant chronic hyponatremia Cont with Fluid restrict to 1 liter/day Will double lasix Continue spironolactone and metolazone, monitor K closely  2. Cirrhosis- No varices on endo but esophageal ulcer, likely peptic.  Started on PPI 3.   Ascites/anasarca/pleaural effusion- s/p denver shunt and thoracentesis  Renal Attending:  As above. Will increase diuretics further and cont to limit fluid. Emmamae Mcnamara C    Subjective: Feeling great, like a new man after denver shunt and fluid drained from chest  Objective: Vital signs in last 24 hours: Temp:  [97.4 F (36.3 C)-98.6 F (37 C)] 98.4 F (36.9 C) (10/23 1156) Pulse Rate:  [70-88] 82 (10/23 1156) Resp:  [10-22] 18 (10/23 1156) BP: (81-127)/(39-82) 110/50 mmHg (10/23 1156) SpO2:  [89 %-100 %] 95 % (10/23 1156) Weight:  [177 lb 3.2 oz (80.377 kg)] 177 lb 3.2 oz (80.377 kg) (10/23 0500) Weight change: -6 lb 14.2 oz (-3.123 kg)  Intake/Output from previous day: 10/22 0701 - 10/23 0700 In: 1960 [P.O.:240; I.V.:1100; Blood:470; IV Piggyback:150] Out: 7000 [Urine:400] Intake/Output this shift: Total I/O In: 360 [P.O.:360] Out: 450 [Urine:450]  General appearance: alert and cooperative GI: marked ascites Extremities: 2+ edema BLE to the ankles  Lab Results:  Recent Labs  10/24/13 0046 10/24/13 0337  WBC 7.3 7.7  HGB 9.2* 9.1*  HCT 25.8* 25.1*  PLT 83*  82* 92*  88*   BMET:   Recent Labs  10/23/13 0519 10/24/13 0824  NA 121* 121*  K 4.3 4.2  CL 85* 84*  CO2 27 30  GLUCOSE 94 111*  BUN 51* 43*  CREATININE 1.14 1.01  CALCIUM 8.1* 7.4*   No results found for this basename: PTH,  in the last 72 hours Iron Studies: No results found  for this basename: IRON, TIBC, TRANSFERRIN, FERRITIN,  in the last 72 hours Studies/Results: Ir Perc Noralee Stain Perit Cath Wo Port  10/23/2013   INDICATION: 53 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 Not a candidate for TIPS.  EXAM: ULTRASOUND GUIDED PERITONEAL ACCESS FOR PARACENTESIS.  ULTRASOUND-GUIDED PLEURAL ACCESS FOR THORACENTESIS.  ULTRASOUND GUIDED RIGHT INTERNAL JUGULAR ACCESS FOR SHUNT PLACEMENT.  DENVER SHUNT (PERITONEAL TO VENOUS) TUNNELED CATHETER -- [id number] -- WITH COMPLICATING MEDICAL CONDITIONS.  CLINICAL DATA:  53 year old male with a history of hepatitis-C and alcoholic cirrhosis. He has been admitted on October 12 with decompensated recurrent ascites. The patient has a MELD score of at least 18, with Child Pugh category C cirrhosis/liver disease, with mild encephalopathy, and is not a candidate for TIPS.  He has been failing medical therapy for recurrent ascites and a right-sided hepatic hydrothorax. He has become hyponatremic with multiple medical problems.  Previous right-sided thoracentesis has yielded 1.5 L October 13, 2013 and 2.0 L October 15, 2013.  Complicating medical history includes cirrhosis, ascites, thrombocytopenia, coagulopathy, hepatitis-C, prior inguinal hernia and chronic alcoholism.  MEDICATIONS: Fentanyl 50 mcg IV; Versed 1.5 mg IV  ANESTHESIA/SEDATION: Sedation Time  116 minutes  Fluoro time: 36 seconds  CONTRAST:  None  COMPLICATIONS: None immediate.  PROCEDURE: Informed consent was obtained from the patient following an explanation of the procedure, risks, benefits and alternatives. Risks discussed included bleeding, infection, sepsis, shunt failure, requirement for shunt replacement, additional procedures, disseminated intravascular coagulopathy/coagulation (DIC), cardiopulmonary collapse, death. The patient understands, agrees and consents for the procedure. All  questions were addressed. A time out was performed prior to the initiation of the procedure. The  patient was positioned supine on the fluoroscopy table for access to both the right pleural space and the peritoneal space, as well as the right internal jugular vein. The operative site was prepped and draped in the usual sterile fashion.  Ultrasound survey of the abdomen was performed with images stored and sent to PACs.  A suitable approach into the peritoneum was determined for placement of a Yueh needle. The needle was advanced into the peritoneum and ascitic fluid was returned. A 0.035 wire was advanced and view needle was removed. Serial dilation with 12 French dilator was performed before placing a 16 French peel-away sheath. The peel-away sheath was then attached to vacuum aspiration for paracentesis.  Ultrasound guided as placement of a second Yueh needle was then performed into the right pleural space. Once the needle was removed the catheter was attached to vacuum aspiration for large volume thoracentesis.  Ultrasound survey of the right internal jugular vein was then performed with images stored and sent to PACs. A micropuncture needle was then used access the right internal jugular vein after infiltration of the skin and subcutaneous tissues with 1% lidocaine without epinephrine. Once we have established access to the right internal jugular vein, serial dilation of the tract with 12 [place] dilator was performed, with subsequent placement of 16 French peel-away sheath.  A site on the right lower thorax at the costochondral junction was selected for placement of the shunt pump. A 3 cm incision was performed, with blunt dissection used to create pocket for the pump.  A 2 L bag of body-temperature saline was then infused into the peritoneal space after complete evacuation of the peritoneal ascites. This fluid was then partially evacuated with vacuum suction, with approximately 500 cc of saline remaining.  The peritoneal limb of the shunt catheter was then back tunneled from the pocket towards the puncture  site on the right upper quadrant. The catheter was pulled through the tract and the tunnel were removed. The catheter was then placed through the peel-away sheath and the peel-away sheath was removed.  The shunt pump was then positioned in the pocket, with 2 Prolene retention sutures placed. The venous limb was tunneled along the chest wall after infiltration of the subcutaneous tissues. The catheter was pulled through the neck puncture site. The required length of the venous limb was determined by placing a J wire through the peel-away sheath and measuring to the right atrium.  The venous limb was amputated at the proper length, and then the entire shunt catheter was primed with the remaining ascitic fluid/saline in the abdomen.  The venous limb was placed through the peel-away at the right IJ and into the right atrium.  A final image was stored.  The shunt pump pocket was closed with a deep layer of Vicryl resorbable suture, a writing subcuticular layer with Monocryl, and interrupted Prolene sutures to assure that the wound does not dehisce with the pressure of pumping the catheter. The peritoneal insertion site was closed with a Vicryl deep suture an a Monocryl suture as well as Derma bond, and the neck incision site was sealed with Dermabond.  The patient tolerated the procedure well and remained hemodynamically stable throughout.  No complications were encountered and no significant blood loss was encountered.  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  PLAN: The  patient will need to pump the shunt pump as instructed 20 times in the morning and 20 times in the evening. It is best in the short term if this is done at least every 3-4 hours to assure that there is no debris within the shunt tubing and shunt pump.  A DIC panel will be ordered every 4 hours starting at 9 p.m. on the day of the procedure to  assure that the patient does not become coagulopathic. The highest risk is within the first 24 hours.  The patient will receive 7 days of antibiotics. The first 24 hours may be IV.  Pain control.  Follow-up appointment in 7-14 days after discharge with Dr. Loreta AveWagner in [facility]. Patient may require occasional repeat thoracentesis over the next several weeks. This need should decrease with successful use of the shunt.  Signed,  Yvone NeuJaime S. Loreta AveWagner, DO  [facility]  [facility]   Electronically Signed   By: Gilmer MorJaime  Wagner D.O.   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 Not a candidate for TIPS.  EXAM: ULTRASOUND GUIDED PERITONEAL ACCESS FOR PARACENTESIS.  ULTRASOUND-GUIDED PLEURAL ACCESS FOR THORACENTESIS.  ULTRASOUND GUIDED RIGHT INTERNAL JUGULAR ACCESS FOR SHUNT PLACEMENT.  DENVER SHUNT (PERITONEAL TO VENOUS) TUNNELED CATHETER -- [id number] -- WITH COMPLICATING MEDICAL CONDITIONS.  CLINICAL DATA:  53 year old male with a history of hepatitis-C and alcoholic cirrhosis. He has been admitted on October 12 with decompensated recurrent ascites. The patient has a MELD score of at least 18, with Child Pugh category C cirrhosis/liver disease, with mild encephalopathy, and is not a candidate for TIPS.  He has been failing medical therapy for recurrent ascites and a right-sided hepatic hydrothorax. He has become hyponatremic with multiple medical problems.  Previous right-sided thoracentesis has yielded 1.5 L October 13, 2013 and 2.0 L October 15, 2013.  Complicating medical history includes cirrhosis, ascites, thrombocytopenia, coagulopathy, hepatitis-C, prior inguinal hernia and chronic alcoholism.  MEDICATIONS: Fentanyl 50 mcg IV; Versed 1.5 mg IV  ANESTHESIA/SEDATION: Sedation Time  116 minutes  Fluoro time: 36 seconds  CONTRAST:  None  COMPLICATIONS:  None immediate.  PROCEDURE: Informed consent was obtained from the patient following an explanation of the procedure, risks, benefits and alternatives. Risks discussed included bleeding, infection, sepsis, shunt failure, requirement for shunt replacement, additional procedures, disseminated intravascular coagulopathy/coagulation (DIC), cardiopulmonary collapse, death. The patient understands, agrees and consents for the procedure. All questions were addressed. A time out was performed prior to the initiation of the procedure. The patient was positioned supine on the fluoroscopy table for access to both the right pleural space and the peritoneal space, as well as the right internal jugular vein. The operative site was prepped and draped in the usual sterile fashion.  Ultrasound survey of the abdomen was performed with images stored and sent to PACs.  A suitable approach into the peritoneum was determined for placement of a Yueh needle. The needle was advanced into the peritoneum and ascitic fluid was returned. A 0.035 wire was advanced and view needle was removed. Serial dilation with 12 French dilator was performed before placing a 16 French peel-away sheath. The peel-away sheath was then attached to vacuum aspiration for paracentesis.  Ultrasound guided as placement of a second Yueh needle was then performed into the right pleural space. Once the needle was removed the catheter was attached to vacuum aspiration for large volume thoracentesis.  Ultrasound survey of the right internal jugular vein was then performed with images stored and sent to PACs. A micropuncture needle was then used access the right internal jugular vein after infiltration of the skin and subcutaneous tissues with 1% lidocaine without epinephrine. Once we have established access to the right internal jugular vein, serial dilation of the tract with 12 [place] dilator was performed, with subsequent placement of 16 French peel-away sheath.  A site  on the right lower thorax at the costochondral junction was selected for placement of the shunt pump. A 3 cm incision was performed, with blunt dissection used to create pocket for the pump.  A 2 L bag of body-temperature saline was then infused into the peritoneal space after complete evacuation of the peritoneal ascites. This fluid was then partially evacuated with vacuum suction, with approximately 500 cc of saline remaining.  The peritoneal limb of the shunt catheter was then back tunneled from the pocket towards the puncture site on the right upper quadrant. The catheter was pulled through the tract and the tunnel were removed. The catheter was then placed through the peel-away sheath and the peel-away sheath was removed.  The shunt pump was then positioned in the pocket, with 2 Prolene retention sutures placed. The venous limb was tunneled along the chest wall after infiltration of the subcutaneous tissues. The catheter was pulled through the neck puncture site. The required length of the venous limb was determined by placing a J wire through the peel-away sheath and measuring to the right atrium.  The venous limb was amputated at the proper length, and then the entire shunt catheter was primed with the remaining ascitic fluid/saline in the abdomen.  The venous limb was placed through the peel-away at the right IJ and into the right atrium.  A final image was stored.  The shunt pump pocket was closed with a deep layer of Vicryl resorbable suture, a writing subcuticular layer with Monocryl, and interrupted Prolene sutures to assure that the wound does not dehisce with the pressure of pumping the catheter. The peritoneal insertion site was closed with a Vicryl deep suture an a Monocryl suture as well as Derma bond, and the neck incision site was sealed with Dermabond.  The patient tolerated the procedure well and remained hemodynamically stable throughout.  No complications were encountered and no significant  blood loss was encountered.  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  PLAN: The patient will need to pump the shunt pump as instructed 20 times in the morning and 20 times in the evening. It is best in the short term if this is done at least every 3-4 hours to assure that there is no debris within the shunt tubing and shunt pump.  A DIC panel will be ordered every 4 hours starting at 9 p.m. on the day of the procedure to assure that the patient does not become coagulopathic. The highest risk is within the first 24 hours.  The patient will receive 7 days of antibiotics. The first 24 hours may be IV.  Pain control.  Follow-up appointment in 7-14 days after discharge with Dr. Loreta Ave in [facility]. Patient may require occasional repeat thoracentesis over the next several weeks. This need should decrease with successful use of the shunt.  Signed,  Yvone Neu. Loreta Ave, DO  [facility]  [facility]   Electronically Signed   By:  Gilmer Mor D.O.   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 Not a candidate for TIPS.  EXAM: ULTRASOUND GUIDED PERITONEAL ACCESS FOR PARACENTESIS.  ULTRASOUND-GUIDED PLEURAL ACCESS FOR THORACENTESIS.  ULTRASOUND GUIDED RIGHT INTERNAL JUGULAR ACCESS FOR SHUNT PLACEMENT.  DENVER SHUNT (PERITONEAL TO VENOUS) TUNNELED CATHETER -- [id number] -- WITH COMPLICATING MEDICAL CONDITIONS.  CLINICAL DATA:  53 year old male with a history of hepatitis-C and alcoholic cirrhosis. He has been admitted on October 12 with decompensated recurrent ascites. The patient has a MELD score of at least 18, with Child Pugh category C cirrhosis/liver disease, with mild encephalopathy, and is not a candidate for TIPS.  He has been failing medical therapy  for recurrent ascites and a right-sided hepatic hydrothorax. He has become hyponatremic with multiple medical problems.  Previous right-sided thoracentesis has yielded 1.5 L October 13, 2013 and 2.0 L October 15, 2013.  Complicating medical history includes cirrhosis, ascites, thrombocytopenia, coagulopathy, hepatitis-C, prior inguinal hernia and chronic alcoholism.  MEDICATIONS: Fentanyl 50 mcg IV; Versed 1.5 mg IV  ANESTHESIA/SEDATION: Sedation Time  116 minutes  Fluoro time: 36 seconds  CONTRAST:  None  COMPLICATIONS: None immediate.  PROCEDURE: Informed consent was obtained from the patient following an explanation of the procedure, risks, benefits and alternatives. Risks discussed included bleeding, infection, sepsis, shunt failure, requirement for shunt replacement, additional procedures, disseminated intravascular coagulopathy/coagulation (DIC), cardiopulmonary collapse, death. The patient understands, agrees and consents for the procedure. All questions were addressed. A time out was performed prior to the initiation of the procedure. The patient was positioned supine on the fluoroscopy table for access to both the right pleural space and the peritoneal space, as well as the right internal jugular vein. The operative site was prepped and draped in the usual sterile fashion.  Ultrasound survey of the abdomen was performed with images stored and sent to PACs.  A suitable approach into the peritoneum was determined for placement of a Yueh needle. The needle was advanced into the peritoneum and ascitic fluid was returned. A 0.035 wire was advanced and view needle was removed. Serial dilation with 12 French dilator was performed before placing a 16 French peel-away sheath. The peel-away sheath was then attached to vacuum aspiration for paracentesis.  Ultrasound guided as placement of a second Yueh needle was then performed into the right pleural space. Once the needle was removed the catheter was attached to  vacuum aspiration for large volume thoracentesis.  Ultrasound survey of the right internal jugular vein was then performed with images stored and sent to PACs. A micropuncture needle was then used access the right internal jugular vein after infiltration of the skin and subcutaneous tissues with 1% lidocaine without epinephrine. Once we have established access to the right internal jugular vein, serial dilation of the tract with 12 [place] dilator was performed, with subsequent placement of 16 French peel-away sheath.  A site on the right lower thorax at the costochondral junction was selected for placement of the shunt pump. A 3 cm incision was performed, with blunt dissection used to create pocket for the pump.  A 2 L bag of body-temperature saline was then infused into the peritoneal space after complete evacuation of the peritoneal ascites. This fluid was then partially evacuated with vacuum suction, with approximately 500 cc of saline remaining.  The peritoneal limb of the shunt catheter was then back tunneled from the pocket towards the puncture site on the right  upper quadrant. The catheter was pulled through the tract and the tunnel were removed. The catheter was then placed through the peel-away sheath and the peel-away sheath was removed.  The shunt pump was then positioned in the pocket, with 2 Prolene retention sutures placed. The venous limb was tunneled along the chest wall after infiltration of the subcutaneous tissues. The catheter was pulled through the neck puncture site. The required length of the venous limb was determined by placing a J wire through the peel-away sheath and measuring to the right atrium.  The venous limb was amputated at the proper length, and then the entire shunt catheter was primed with the remaining ascitic fluid/saline in the abdomen.  The venous limb was placed through the peel-away at the right IJ and into the right atrium.  A final image was stored.  The shunt pump pocket  was closed with a deep layer of Vicryl resorbable suture, a writing subcuticular layer with Monocryl, and interrupted Prolene sutures to assure that the wound does not dehisce with the pressure of pumping the catheter. The peritoneal insertion site was closed with a Vicryl deep suture an a Monocryl suture as well as Derma bond, and the neck incision site was sealed with Dermabond.  The patient tolerated the procedure well and remained hemodynamically stable throughout.  No complications were encountered and no significant blood loss was encountered.  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  PLAN: The patient will need to pump the shunt pump as instructed 20 times in the morning and 20 times in the evening. It is best in the short term if this is done at least every 3-4 hours to assure that there is no debris within the shunt tubing and shunt pump.  A DIC panel will be ordered every 4 hours starting at 9 p.m. on the day of the procedure to assure that the patient does not become coagulopathic. The highest risk is within the first 24 hours.  The patient will receive 7 days of antibiotics. The first 24 hours may be IV.  Pain control.  Follow-up appointment in 7-14 days after discharge with Dr. Loreta AveWagner in [facility]. Patient may require occasional repeat thoracentesis over the next several weeks. This need should decrease with successful use of the shunt.  Signed,  Yvone NeuJaime S. Loreta AveWagner, DO  [facility]  [facility]   Electronically Signed   By: Gilmer MorJaime  Wagner D.O.   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 Not a candidate for TIPS.  EXAM: ULTRASOUND GUIDED PERITONEAL ACCESS FOR PARACENTESIS.  ULTRASOUND-GUIDED PLEURAL ACCESS  FOR THORACENTESIS.  ULTRASOUND GUIDED RIGHT INTERNAL JUGULAR ACCESS FOR SHUNT PLACEMENT.  DENVER SHUNT (PERITONEAL TO VENOUS) TUNNELED CATHETER -- [id number] -- WITH COMPLICATING MEDICAL CONDITIONS.  CLINICAL DATA:  53 year old male with a history of hepatitis-C and alcoholic cirrhosis. He has been admitted on October 12 with decompensated recurrent ascites. The patient has a MELD score of at least 18, with Child Pugh category C cirrhosis/liver disease, with mild encephalopathy, and is not a candidate for TIPS.  He has been failing medical therapy for recurrent ascites and a right-sided hepatic hydrothorax. He has become hyponatremic with multiple medical problems.  Previous right-sided thoracentesis has yielded 1.5 L October 13, 2013 and 2.0 L October 15, 2013.  Complicating medical  history includes cirrhosis, ascites, thrombocytopenia, coagulopathy, hepatitis-C, prior inguinal hernia and chronic alcoholism.  MEDICATIONS: Fentanyl 50 mcg IV; Versed 1.5 mg IV  ANESTHESIA/SEDATION: Sedation Time  116 minutes  Fluoro time: 36 seconds  CONTRAST:  None  COMPLICATIONS: None immediate.  PROCEDURE: Informed consent was obtained from the patient following an explanation of the procedure, risks, benefits and alternatives. Risks discussed included bleeding, infection, sepsis, shunt failure, requirement for shunt replacement, additional procedures, disseminated intravascular coagulopathy/coagulation (DIC), cardiopulmonary collapse, death. The patient understands, agrees and consents for the procedure. All questions were addressed. A time out was performed prior to the initiation of the procedure. The patient was positioned supine on the fluoroscopy table for access to both the right pleural space and the peritoneal space, as well as the right internal jugular vein. The operative site was prepped and draped in the usual sterile fashion.  Ultrasound survey of the abdomen was performed with images stored and sent to PACs.  A suitable  approach into the peritoneum was determined for placement of a Yueh needle. The needle was advanced into the peritoneum and ascitic fluid was returned. A 0.035 wire was advanced and view needle was removed. Serial dilation with 12 French dilator was performed before placing a 16 French peel-away sheath. The peel-away sheath was then attached to vacuum aspiration for paracentesis.  Ultrasound guided as placement of a second Yueh needle was then performed into the right pleural space. Once the needle was removed the catheter was attached to vacuum aspiration for large volume thoracentesis.  Ultrasound survey of the right internal jugular vein was then performed with images stored and sent to PACs. A micropuncture needle was then used access the right internal jugular vein after infiltration of the skin and subcutaneous tissues with 1% lidocaine without epinephrine. Once we have established access to the right internal jugular vein, serial dilation of the tract with 12 [place] dilator was performed, with subsequent placement of 16 French peel-away sheath.  A site on the right lower thorax at the costochondral junction was selected for placement of the shunt pump. A 3 cm incision was performed, with blunt dissection used to create pocket for the pump.  A 2 L bag of body-temperature saline was then infused into the peritoneal space after complete evacuation of the peritoneal ascites. This fluid was then partially evacuated with vacuum suction, with approximately 500 cc of saline remaining.  The peritoneal limb of the shunt catheter was then back tunneled from the pocket towards the puncture site on the right upper quadrant. The catheter was pulled through the tract and the tunnel were removed. The catheter was then placed through the peel-away sheath and the peel-away sheath was removed.  The shunt pump was then positioned in the pocket, with 2 Prolene retention sutures placed. The venous limb was tunneled along the chest  wall after infiltration of the subcutaneous tissues. The catheter was pulled through the neck puncture site. The required length of the venous limb was determined by placing a J wire through the peel-away sheath and measuring to the right atrium.  The venous limb was amputated at the proper length, and then the entire shunt catheter was primed with the remaining ascitic fluid/saline in the abdomen.  The venous limb was placed through the peel-away at the right IJ and into the right atrium.  A final image was stored.  The shunt pump pocket was closed with a deep layer of Vicryl resorbable suture, a writing subcuticular layer with Monocryl, and interrupted Prolene  sutures to assure that the wound does not dehisce with the pressure of pumping the catheter. The peritoneal insertion site was closed with a Vicryl deep suture an a Monocryl suture as well as Derma bond, and the neck incision site was sealed with Dermabond.  The patient tolerated the procedure well and remained hemodynamically stable throughout.  No complications were encountered and no significant blood loss was encountered.  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  PLAN: The patient will need to pump the shunt pump as instructed 20 times in the morning and 20 times in the evening. It is best in the short term if this is done at least every 3-4 hours to assure that there is no debris within the shunt tubing and shunt pump.  A DIC panel will be ordered every 4 hours starting at 9 p.m. on the day of the procedure to assure that the patient does not become coagulopathic. The highest risk is within the first 24 hours.  The patient will receive 7 days of antibiotics. The first 24 hours may be IV.  Pain control.  Follow-up appointment in 7-14 days after discharge with Dr. Loreta Ave in [facility]. Patient may require  occasional repeat thoracentesis over the next several weeks. This need should decrease with successful use of the shunt.  Signed,  Yvone Neu. Loreta Ave, DO  [facility]  [facility]   Electronically Signed   By: Gilmer Mor D.O.   On: 10/23/2013 19:42    Scheduled: . sodium chloride   Intravenous Once  . sodium chloride   Intravenous Once  . clonazePAM  0.5 mg Oral QHS  . fentaNYL  25 mcg Transdermal Q72H  . folic acid  1 mg Oral Daily  . furosemide  80 mg Oral BID  . lactulose  30 g Oral Daily  . pantoprazole  40 mg Oral Q0600  . sodium chloride  3 mL Intravenous Q12H  . spironolactone  25 mg Oral BID  . tamsulosin  0.4 mg Oral Daily  . thiamine  100 mg Oral Daily     LOS: 13 days   Beverely Low 10/24/2013,11:59 AM

## 2013-10-24 NOTE — Progress Notes (Signed)
Patient ID: Xavier Valentine, male   DOB: 1960-12-17, 53 y.o.   MRN: [id number]  Denver Shunt placed yesterday in IR with Dr Loreta AveWagner Procedure was without complication  At risk for DIC development Labs this am does reveal drop in plts and fibrinogen  Dr Loreta AveWagner aware and has ordered 2u plts stat  Asked for consult with Hematology I have contacted Drr Clelia CroftShadad He will see pt today

## 2013-10-24 NOTE — Consult Note (Signed)
Reason for Referral: DIC.   HPI: 53 year old man native [place]. He has a PMH significant for cirrhosis of the liver, history of alcohol use, hepatitis C. He presented on 10/07/2013 with right side chest pain, abdominal pain, SOB that started the night prior to admission. He was found to have recurrent ascites and pleural effusion. On 10/23/2013 he under went a peritoneo-venous shunt (Denver shunt) placement for management of ascites and related hepatic hydrothorax. July 2 the risk of DIC post procedure, he has been doing DIC panels are free 4 hours. It was noted that he had a pleasant a drop postprocedure 132 down to 93 but for the last 24 hours his platelet count was steady at 92. His coagulation parameters revealed a PT between 26 and 28 which is close to his baseline. His fibrinogen to drop from 132 to 69. He is receiving 2 units of platelets prophylactically. I was asked to comment regarding his DIC potential. Clinically, he is asymptomatic at this time. He tolerated the procedure well and denied any bleeding. He has not reported any epistaxis, hematochezia, melena or ecchymosis. He has not reported any hematochezia or melena. The catheter site as well as IV sites did not show any oozing or bleeding. He does not report any headaches altered mental status. Has not reported any fevers or chills or sweats. Does report abdominal distention but no constipation or diarrhea. Does not report any hematuria or dysuria. Does not report any skeletal complaints. Rest of his review of system is unremarkable.     Past Medical History  Diagnosis Date  . Cirrhosis 08/2013    ETOH and Hep C  . Ascites 09/2013  . Thrombocytopenia 09/2013  . Coagulopathy 09/2013    due to hepatic dysfunction.   . Pneumonia 09/2013  . Chronic alcoholism   . Hepatitis C dx'd ~ 2000  . Anxiety   . Inguinal hernia     repaired in [place] 08/2013  :  Past Surgical History  Procedure Laterality Date  . Incision and drainage  perirectal abscess  1980's  . Inguinal hernia repair Right 08/2013    at [facility] [place].   . Esophagogastroduodenoscopy N/A 10/22/2013    Procedure: ESOPHAGOGASTRODUODENOSCOPY (EGD);  Surgeon: Hilarie FredricksonJohn N Perry, MD;  [facility];  Service: Endoscopy;  Laterality: N/A;  [facility]  :   Current Facility-Administered Medications  Medication Dose Route Frequency Provider Last Rate Last Dose  . 0.9 %  sodium chloride infusion   Intravenous Once Berneta LevinsKoreen D Morgan, PA-C      . 0.9 %  sodium chloride infusion   Intravenous Once Gilmer MorJaime Wagner, DO      . albuterol (PROVENTIL) (2.5 MG/3ML) 0.083% nebulizer solution 3 mL  3 mL Inhalation Q6H PRN Rolan Lipahomas Michael Callahan, NP   3 mL at 10/12/13 0453  . clonazePAM (KLONOPIN) tablet 0.5 mg  0.5 mg Oral QHS Leroy SeaPrashant K Singh, MD   0.5 mg at 10/23/13 2156  . docusate sodium (COLACE) capsule 100 mg  100 mg Oral BID PRN Leroy SeaPrashant K Singh, MD      . fentaNYL (DURAGESIC - dosed mcg/hr) patch 25 mcg  25 mcg Transdermal Q72H Leroy SeaPrashant K Singh, MD   25 mcg at 10/23/13 2156  . folic acid (FOLVITE) tablet 1 mg  1 mg Oral Daily Leroy SeaPrashant K Singh, MD   1 mg at 10/24/13 1010  . furosemide (LASIX) tablet 160 mg  160 mg Oral BID Abram SanderElena M [name],  MD      . guaiFENesin-dextromethorphan (ROBITUSSIN DM) 100-10 MG/5ML syrup 5 mL  5 mL Oral Q4H PRN Leroy Sea, MD   5 mL at 10/21/13 2158  . lactulose (CHRONULAC) 10 GM/15ML solution 30 g  30 g Oral Daily Dianah Field, PA-C   30 g at 10/24/13 1010  . morphine 2 MG/ML injection 2 mg  2 mg Intravenous Q3H PRN Osvaldo Shipper, MD      . ondansetron (ZOFRAN) tablet 4 mg  4 mg Oral Q6H PRN Leroy Sea, MD       Or  . ondansetron (ZOFRAN) injection 4 mg  4 mg Intravenous Q6H PRN Leroy Sea, MD      . oxyCODONE (Oxy IR/ROXICODONE) immediate release tablet 5 mg  5 mg Oral Q6H PRN Kathlen Mody, MD   5 mg at 10/22/13 1941  . pantoprazole (PROTONIX) EC tablet 40 mg  40 mg Oral Q0600  Hilarie Fredrickson, MD   40 mg at 10/24/13 0525  . polyethylene glycol (MIRALAX / GLYCOLAX) packet 17 g  17 g Oral Daily PRN Kathlen Mody, MD      . sodium chloride 0.9 % injection 3 mL  3 mL Intravenous Q12H Leroy Sea, MD   3 mL at 10/24/13 1011  . spironolactone (ALDACTONE) tablet 25 mg  25 mg Oral BID Lauris Poag, MD   25 mg at 10/24/13 0758  . tamsulosin (FLOMAX) capsule 0.4 mg  0.4 mg Oral Daily Leroy Sea, MD   0.4 mg at 10/24/13 1010  . thiamine (VITAMIN B-1) tablet 100 mg  100 mg Oral Daily Leroy Sea, MD   100 mg at 10/24/13 1010     Allergies  Allergen Reactions  . Bee Venom Swelling  :  Family History  Problem Relation Age of Onset  . CAD    . Hypertension    . Diabetes Mother   . Hypertension Mother   . Cancer Father   . Cancer Maternal Grandfather   :  History   Social History  . Marital Status: Single    Spouse Name: N/A    Number of Children: N/A  . Years of Education: N/A   Occupational History  . Not on file.   Social History Main Topics  . Smoking status: Former Smoker -- 10 years    Types: Cigarettes  . Smokeless tobacco: Never Used     Comment: "smoked 1/2 pack/wk when I did smoke"  . Alcohol Use: Yes     Comment: 10/07/2013 "I've had 2 drinks since 08/2013; I"ve quit"  . Drug Use: 0.20 per week    Special: Marijuana, Cocaine     Comment: 10/07/2013 "last cocaine 08/2013; last marijuana was a couple days ago"  . Sexual Activity: Yes    Birth Control/ Protection: Condom   Other Topics Concern  . Not on file   Social History Narrative  . No narrative on file  :  Pertinent items are noted in HPI.  Exam: ECOG 1 Blood pressure 94/56, pulse 86, temperature 98 F (36.7 C), temperature source Axillary, resp. rate 18, height 5\' 11"  (1.803 m), weight 177 lb 3.2 oz (80.377 kg), SpO2 92.00%. General appearance: alert and cooperative Throat: lips, mucosa, and tongue normal; teeth and gums normal Neck: no adenopathy Back:  negative Resp: clear to auscultation bilaterally Chest wall: no tenderness Cardio: regular rate and rhythm, S1, S2 normal, no murmur, click, rub or gallop GI: Slightly distended with positive bowel  sounds. Nontender with some shifting dullness. Extremities: extremities normal, atraumatic, no cyanosis or edema Pulses: 2+ and symmetric Skin: Skin color, texture, turgor normal. No rashes or lesions. Could not appreciate any ecchymosis or petechiae. Lymph nodes: Cervical, supraclavicular, and axillary nodes normal.   Recent Labs  10/24/13 0046 10/24/13 0337  WBC 7.3 7.7  HGB 9.2* 9.1*  HCT 25.8* 25.1*  PLT 83*  82* 92*  88*    Recent Labs  10/23/13 0519 10/24/13 0824  NA 121* 121*  K 4.3 4.2  CL 85* 84*  CO2 27 30  GLUCOSE 94 111*  BUN 51* 43*  CREATININE 1.14 1.01  CALCIUM 8.1* 7.4*     Blood smear review: No schistocytes noted   Impression and recommendation: 53 year old gentleman with cirrhosis of the liver and ascites complicated with hydrothorax. He is status post peritoneo-venous shunt (Denver shunt) placement for management of ascites and related hepatic hydrothorax. Postoperatively, he developed what appears to be a low-grade DIC as noted by slight drop in his platelets, troponin his fibrinogen and slight increase in his PT time. His peripheral smear did not show any evidence of schistocytes and he has no clinical or active bleeding.  My recommendation is to continue with the current management which include DIC profile every 6 hours for the next 24-48 hours. I will not prophylactically to place any products unless there is a clear drop in his counts. I would replace his platelets with another transfusion as his platelets dropped below 75,000. I would transfuse fresh frozen plasma or cryoprecipitate if he develops active bleeding. It is anticipated that he will have decline in his fibrinogen as well as platelets related today will DIC but I would not replace any  products unless there is active bleeding noted.    For the time being, I would like him clinically coupled with DIC panel and transfuse with signs of bleeding. He appears to be clinically well at this time without any hemodynamic instability.  Please call the hematology service over the weekend with any questions regarding this gentleman.

## 2013-10-24 NOTE — Progress Notes (Signed)
Daily Rounding Note  10/24/2013, 8:20 AM  LOS: 13 days   SUBJECTIVE:       C/o abdominal pain post procedure.  No nausea.  BMs soft.  urinating  OBJECTIVE:         Vital signs in last 24 hours:    Temp:  [97.4 F (36.3 C)-98.7 F (37.1 C)] 98 F (36.7 C) (10/23 0530) Pulse Rate:  [70-88] 82 (10/23 0530) Resp:  [10-22] 18 (10/23 0530) BP: (81-127)/(39-93) 94/54 mmHg (10/23 0530) SpO2:  [89 %-100 %] 91 % (10/23 0530) Weight:  [177 lb 3.2 oz (80.377 kg)] 177 lb 3.2 oz (80.377 kg) (10/23 0500) Last BM Date: 10/22/13 Filed Weights   10/22/13 0500 10/23/13 0547 10/24/13 0500  Weight: 182 lb 8 oz (82.781 kg) 184 lb 1.4 oz (83.5 kg) 177 lb 3.2 oz (80.377 kg)   General: cachectic, malnourished looking but overall improved.   Heart: RRR Chest: clear bil Abdomen: examined while sitting on BS commode.  Soft, protuberant, tender on Right.  Bandages cover sites  Extremities: slight LE edema Neuro/Psych:  Constant talking, oriented x 3.  No tremor, no asterixis.   Intake/Output from previous day: 10/22 0701 - 10/23 0700 In: 1960 [P.O.:240; I.V.:1100; Blood:470; IV Piggyback:150] Out: 7000 [Urine:400]  Intake/Output this shift:    Lab Results:  Recent Labs  10/23/13 0519  10/23/13 2125 10/24/13 0046 10/24/13 0337  WBC 9.8  --   --  7.3 7.7  HGB 10.9*  --   --  9.2* 9.1*  HCT 29.9*  --   --  25.8* 25.1*  PLT 132*  < > 94* 83*  82* 92*  88*  < > = values in this interval not displayed.  BMET  Recent Labs  10/22/13 0920 10/23/13 0519  NA 122* 121*  K 4.5 4.3  CL 85* 85*  CO2 29 27  GLUCOSE 73 94  BUN 43* 51*  CREATININE 0.99 1.14  CALCIUM 8.3* 8.1*   LFT  Recent Labs  10/23/13 0519  PROT 6.5  ALBUMIN 1.7*  1.6*  AST 58*  ALT 39  ALKPHOS 110  BILITOT 2.3*  BILIDIR 0.9*  IBILI 1.4*   PT/INR  Recent Labs  10/24/13 0046 10/24/13 0337  LABPROT 26.8* 28.0*  INR 2.45* 2.60*    Studies/Results: Ir Perc Tun Perit Cath Wo Port Ir Koreas Guide Vasc Access Right Ir Koreas Guide Bx Asp/drain Ir Thoracentesis Asp Pleural Space W/img Guide 10/23/2013   INDICATION: 53 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 Not a candidate for TIPS.  EXAM: ULTRASOUND GUIDED PERITONEAL ACCESS FOR PARACENTESIS.  ULTRASOUND-GUIDED PLEURAL ACCESS FOR THORACENTESIS.  ULTRASOUND GUIDED RIGHT INTERNAL JUGULAR ACCESS FOR SHUNT PLACEMENT.  DENVER SHUNT (PERITONEAL TO VENOUS) TUNNELED CATHETER -- [id number] -- WITH COMPLICATING MEDICAL CONDITIONS.  CLINICAL DATA:  53 year old male with a history of hepatitis-C and alcoholic cirrhosis. He has been admitted on October 12 with decompensated recurrent ascites. The patient has a MELD score of at least 18, with Child Pugh category C cirrhosis/liver disease, with mild encephalopathy, and is not a candidate for TIPS.  He has been failing medical therapy for recurrent ascites and a right-sided hepatic hydrothorax. He has become hyponatremic with multiple medical problems.  Previous right-sided thoracentesis has yielded 1.5 L October 13, 2013 and 2.0 L October 15, 2013.  Complicating medical history includes cirrhosis, ascites, thrombocytopenia, coagulopathy, hepatitis-C, prior inguinal hernia and chronic alcoholism.  MEDICATIONS: Fentanyl 50 mcg IV; Versed 1.5 mg IV  ANESTHESIA/SEDATION: Sedation Time  116 minutes  Fluoro time: 36 seconds  CONTRAST:  None  COMPLICATIONS: None immediate.  PROCEDURE: Informed consent was obtained from the patient following an explanation of the procedure, risks, benefits and alternatives. Risks discussed included bleeding, infection, sepsis, shunt failure, requirement for shunt replacement, additional procedures, disseminated intravascular coagulopathy/coagulation (DIC), cardiopulmonary collapse, death. The patient understands, agrees and consents for the procedure. All questions were addressed. A time out was performed prior to the  initiation of the procedure. The patient was positioned supine on the fluoroscopy table for access to both the right pleural space and the peritoneal space, as well as the right internal jugular vein. The operative site was prepped and draped in the usual sterile fashion.  Ultrasound survey of the abdomen was performed with images stored and sent to PACs.  A suitable approach into the peritoneum was determined for placement of a Yueh needle. The needle was advanced into the peritoneum and ascitic fluid was returned. A 0.035 wire was advanced and view needle was removed. Serial dilation with 12 French dilator was performed before placing a 16 French peel-away sheath. The peel-away sheath was then attached to vacuum aspiration for paracentesis.  Ultrasound guided as placement of a second Yueh needle was then performed into the right pleural space. Once the needle was removed the catheter was attached to vacuum aspiration for large volume thoracentesis.  Ultrasound survey of the right internal jugular vein was then performed with images stored and sent to PACs. A micropuncture needle was then used access the right internal jugular vein after infiltration of the skin and subcutaneous tissues with 1% lidocaine without epinephrine. Once we have established access to the right internal jugular vein, serial dilation of the tract with 12 [place] dilator was performed, with subsequent placement of 16 French peel-away sheath.  A site on the right lower thorax at the costochondral junction was selected for placement of the shunt pump. A 3 cm incision was performed, with blunt dissection used to create pocket for the pump.  A 2 L bag of body-temperature saline was then infused into the peritoneal space after complete evacuation of the peritoneal ascites. This fluid was then partially evacuated with vacuum suction, with approximately 500 cc of saline remaining.  The peritoneal limb of the shunt catheter was then back tunneled from  the pocket towards the puncture site on the right upper quadrant. The catheter was pulled through the tract and the tunnel were removed. The catheter was then placed through the peel-away sheath and the peel-away sheath was removed.  The shunt pump was then positioned in the pocket, with 2 Prolene retention sutures placed. The venous limb was tunneled along the chest wall after infiltration of the subcutaneous tissues. The catheter was pulled through the neck puncture site. The required length of the venous limb was determined by placing a J wire through the peel-away sheath and measuring to the right atrium.  The venous limb was amputated at the proper length, and then the entire shunt catheter was primed with the remaining ascitic fluid/saline in the abdomen.  The venous limb was placed through the peel-away at the right IJ and into the right atrium.  A final image was stored.  The shunt pump pocket was closed with a deep layer of Vicryl resorbable suture, a writing subcuticular layer with Monocryl, and interrupted Prolene sutures to assure that the wound does not dehisce with the pressure of pumping the catheter. The peritoneal insertion site was closed with a  Vicryl deep suture an a Monocryl suture as well as Derma bond, and the neck incision site was sealed with Dermabond.  The patient tolerated the procedure well and remained hemodynamically stable throughout.  No complications were encountered and no significant blood loss was encountered.  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  PLAN: The patient will need to pump the shunt pump as instructed 20 times in the morning and 20 times in the evening. It is best in the short term if this is done at least every 3-4 hours to assure that there is no debris within the shunt tubing and shunt pump.  A DIC panel will be ordered every 4 hours starting at 9 p.m. on  the day of the procedure to assure that the patient does not become coagulopathic. The highest risk is within the first 24 hours.  The patient will receive 7 days of antibiotics. The first 24 hours may be IV.  Pain control.  Follow-up appointment in 7-14 days after discharge with Dr. Loreta Ave in [facility]. Patient may require occasional repeat thoracentesis over the next several weeks. This need should decrease with successful use of the shunt.  Signed,  Yvone Neu. Loreta Ave, DO  [facility]  [facility]   Electronically Signed   By: Gilmer Mor D.O.   On: 10/23/2013 19:42    Scheduled Meds: . sodium chloride   Intravenous Once  . sodium chloride   Intravenous Once  . clonazePAM  0.5 mg Oral QHS  . fentaNYL  25 mcg Transdermal Q72H  . folic acid  1 mg Oral Daily  . furosemide  80 mg Oral BID  . lactulose  30 g Oral Daily  . metolazone  5 mg Oral BID  . pantoprazole  40 mg Oral Q0600  . sodium chloride  3 mL Intravenous Q12H  . spironolactone  25 mg Oral BID  . tamsulosin  0.4 mg Oral Daily  . thiamine  100 mg Oral Daily  . vancomycin  1,000 mg Intravenous Q12H   Continuous Infusions:  PRN Meds:.albuterol, docusate sodium, guaiFENesin-dextromethorphan, morphine injection, ondansetron (ZOFRAN) IV, ondansetron, oxyCODONE, polyethylene glycol   ASSESMENT:   * Cirrhosis (Hep C and ETOH). Child's-Pugh score 10, MELD 18 on 10/18.  Hepatitis C viral count 92,510; genotype 1A. No previous antiviral treatment.   * Hepatic hydrothorax.  Reaccumulates following multiple large volume thoracentesis.  Diuretic therapy constrained by hyponatremia, hyperkalemia.   Renal assisting with diuretic management.   S/p 10/23/13 peritoneo-venous shunt (Denver shunt) per DR Roma Kayser, DO.  May need occasional thoracentesis in next few weeks per Dr Marcille Buffy notes   * Coagulopathy.  Elevation of D-dimer, decreased fibrinogen,  elevation of PT/INR,  Thrombocytopenia: all worsening since P-V shunt.   *  ARF, declining GFR noted.    * S/p EGD 10/22/13:  1. Distal esophageal ulcer. Likely peptic. Status post biopsy  2. NO esophageal varices... Mild portal gastropathy  PO protonix added 10/21. Consider repeat EGD ~ 12/23/13   * Rib fractures: ongoing associated pain management with prn Morphine, Fentanyl patch.   * Hyponatremia, ongoing.  Renal assisting in mgt.   * Ascites. No SBP on 9/20 3.6 liter paracentesis. 10/7 abdominal ultrasound: not enough ascites to tap. S/p P-V shunt 10/22.  Weight down 5 to 7 # in last 48 hours.   * Encephalopathy? Ammonia levels never abnormal.  No acute  MS confusion or somnolence at admission.  Continues to be oriented x 3 but has impulsive behavior (per PT), talkaholism, some confabulation. Suspect underlying mental health disorder (OCD, bipolar, Wernicke's).    Rifaximin discontinued and lactulose does decreased 10/22.   * Alcoholism and drug abuse. Claims abstinence since late 08/2013. No tox screens since 08/29/13.  *  Protein malnutrition.  Due to liver disease, protein hydrothorax.  Eating very well.     PLAN   *  Follow coags, CBC, electrolytes, renal function, urine output.  *  GI signing off, available prn.  Will arrange GI ROV ~ 4 weeks with Dr. Christella HartiganJacobs.  *  Daily weights. Daily PPI.     Jennye MoccasinSarah Gribbin  10/24/2013, 8:20 AM Pager: [phone]  GI ATTENDING  Interval history and data reviewed. Patient status post Denver shunt placement. Agree with progress note as outlined above. From our standpoint he is stable. He needs to continue daily PPI for esophageal ulcer (biopsies negative). He should followup with Dr. Christella HartiganJacobs for GI continuity care about 4 weeks. We're available as needed prior to discharge. Thank you  Wilhemina BonitoJohn N. Eda KeysPerry, [name], M.D. [facility] Division of G[facility]enterology

## 2013-10-24 NOTE — Progress Notes (Signed)
Agree with above note. Once DIC labs stabilize, and primary team is satisfied with progression, may be DC'd home with follow up in clinic.    DC instructions:  Assure that patient understands need to pump shunt every 4-6 hours for first week.  Occlusion may occur otherwise. 7 days ABX (would suggest amoxicillin or clindamycin) 7-10 days follow up in clinic with another set of DIC labs, INR, CBC to assure no late manifestation of DIC.   Will need sutures removed at that time, as well as possible US guided right thoKorearacentesis.  His right chest fluid will likely recur in short term, and thora will be necessary for short term.   Call with questions/concerns.  Signed,  Yvone NeuJaime S. Loreta AveWagner, DO

## 2013-10-24 NOTE — Progress Notes (Signed)
Pt received 1unit of platelet which was A Rh negative with no s/s of allergic reaction noted; pt second 1unit was A R positive; blood bank called for clarification and they stated " it's ok to transfuse; that don't matter". Transfusion going with not s/s of reaction yet. Reported off to incoming RN to monitor. Arabella MerlesP. Amo Monti Villers RN.

## 2013-10-24 NOTE — Progress Notes (Signed)
Following DIC panel post Denver Shunt placement. Fibrinogen decreased slightly, platelets decreased slightly, INR slightly higher but pt has NO SIGNS of bleeding and smear is negative for schistocytes. Will continue to follow labs and call hemoc should plts drop further.  IR to f/up in am and may want hemoc to consult due to pt's chronic liver disease. I discussed labs with Dr. Alvester MorinNewton of [facility] and he agrees with plan.  [facility] Center[facility]JKG, NP [facility]

## 2013-10-24 NOTE — Progress Notes (Signed)
CRITICAL VALUE ALERT  Critical value received:  Fibrinogen < 60  Date of notification:  10/30/13  Time of notification:  2023P  Critical value read back:Yes.    Nurse who received alert:  AJeanelle Malling. olufunke Tenika Keeran  MD notified (1st page):  Elray McgregorMary Lynch  Time of first page:  2038P  MD notified (2nd page):  Time of second page:  Responding MD:  Elray McgregorMary Lynch  Time MD responded:  [phone]

## 2013-10-24 NOTE — Progress Notes (Signed)
Agree with above note.  Patient at-risk for DIC for 7 days, highest risk in first 24 hours.   Appreciate Hematology input.  Signed,  Yvone NeuJaime S. Loreta AveWagner, DO

## 2013-10-24 NOTE — Progress Notes (Signed)
PT Cancellation Note  Patient Details Name: Xavier DuskyRichard L Valentine MRN: [id number] DOB: 06/05/1960   Cancelled Treatment:    Reason Eval/Treat Not Completed: Patient at procedure or test/unavailable Nursing working with patient currently, setting up for transfusion. Will follow up as time allows.   [address], [place] [phone]   [place], Chawn Spraggins S 10/24/2013, 2:59 PM

## 2013-10-24 NOTE — Progress Notes (Signed)
PROGRESS NOTE    Xavier Valentine:[id number] DOB: 1960/08/12 DOA: 10/11/2013  PCP: [name], MD  HPI/Brief narrative 53 year old male with history of hep C/EtOH cirrhosis, coagulopathy, thrombocytopenia, right-sided transudative pleural effusions s/p thoracentesis x1 on 09/27/13, discharged from hospital on 10/10/13, are admitted on 10/11/13 secondary to worsening left-sided chest wall pain and noted to have large right-sided pleural effusion. During previous admission, pulmonology had recommended treating his pleural effusion with Lasix and holding of thoracentesis. Pulmonology was consulted again patient undergoing repeated right thoracentesis by IR. Palliative care consulted for goals of care on 10/13. Patient underwent EGD and placement of Denver shunt. He is experiencing low grade DIC which is being monitored closely.   Assessment/Plan:  1. Hep C/ETOH cirrhosis/coagulopathy/thrombocytopenia/? Encephalopathy: Ammonia normal.  As per patient, has not had any alcohol for 2.5 months. Rifaximin stopped by GI. Continue  lactulose. Patient apparently has followup appointment with local hepatology clinic and GI and has never followed up. GI following. EGD 10/21 for evaluation of varices found no varices. Esophageal ulcer noted. Patient underwent placement of Denver shunt by IR on 10/22. Patient education on how to use the shunt. 2. DIC: Low grade DIC noted which can occur after placement of Denver shunt. Discussed with Dr. Clelia Croft and appreciate his input. No FFP unless he has bleeding. Platelet transfusion only if counts drop below 75k. DIC panel every 6 hours for 48 hrs. 3. 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 Pulmonology consulted and initially placed him on IV Lasix and oral Aldactone, without significant improvement. Aldactone discontinued secondary to hyperkalemia. Patient underwent multiple thoracentesis but always  reaccumulated. Pulmonary advised no chest tube due to difficulty removing (hepatic hydrothorax) and consider antibiotics if declines (fever, leukocytosis). Pleural fluid culture negative to date. Underwent left thoracocentesis on 10/14. Repeat cxr shows no left sided effusion. Stable from respiratory standpoint. 4. Left sided chest pain/ 7 & 8 rib fractures: This was made worse by dyspnea from problem #1. Pain management and monitor. Improved. 5. 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 Fluid restriction of 1 liter. Follow BMP closely. Nephrology assisting. This might be a new baseline for him. 6. History of alcohol abuse: Ativan protocol. No overt withdrawal. 7. Ascites: recent ultrasound apparently had not enough ascites to tap. Denies abdominal pain. No features suggestive of SBP. Monitor. 8. Failure to thrive: Seen by PMT. Patient wants to pursue all treatment options. DNR. 9. Hyperkalemia: Resolved. Kayexalate was given and Aldactone was stopped.  10. Distal Esophageal Ulcer: Noted on EGD. On PPI. May need repeat EGD in 8 weeks.   DVT Prophylaxis: SCD's Code Status: DO NOT RESUSCITATE Family Communication: Discussed with patient.  Disposition Plan: Home when medically stable. Will need to be here for another 48hours due to DIC. Will need follow up with Pulmonology/GI/IR.   Consultants:  Pulmonology  Palliative care team-pending  Gastroenterology  IR  Procedures: Therapeutic thoracentesis 10/11, 10/12, 10/14  EGD 10/21  ENDOSCOPIC IMPRESSION:  1. Distal esophageal ulcer. Likely peptic. Status post biopsy  2. NO esophageal varices... Mild portal gastropathy  Denver Shunt Placement 10/22  Antibiotics:  None  Subjective: Complains of some post procedure pain.   Objective: Filed Vitals:   10/23/13 2047 10/23/13 2144 10/24/13 0500 10/24/13 0530  BP: 98/46 93/47  94/54  Pulse: 81 73  82  Temp: 97.9 F (36.6 C) 98.2 F (36.8 C)  98 F (36.7 C)  TempSrc:  Axillary Axillary  Oral  Resp: 18 18  18   Height:  Weight:   80.377 kg (177 lb 3.2 oz)   SpO2: 93% 91%  91%    Intake/Output Summary (Last 24 hours) at 10/24/13 1012 Last data filed at 10/24/13 0900  Gross per 24 hour  Intake   1960 ml  Output   7450 ml  Net  -5490 ml   Filed Weights   10/22/13 0500 10/23/13 0547 10/24/13 0500  Weight: 82.781 kg (182 lb 8 oz) 83.5 kg (184 lb 1.4 oz) 80.377 kg (177 lb 3.2 oz)     Exam:  General exam: chronically ill-looking male lying comfortably on bed.  Respiratory system: improved air entry but diminished at bases.  Cardiovascular system: S1 & S2 heard, RRR. No JVD, murmurs, gallops, clicks or pedal edema. Dressing noted over right lower chest. Gastrointestinal system: Abdomen is mildly distended, soft and nontender.  Central nervous system: Alert and oriented. No focal neurological deficits.  Data Reviewed: Basic Metabolic Panel:  Recent Labs Lab 10/20/13 0415 10/20/13 1105 10/22/13 0920 10/23/13 0519 10/24/13 0824  NA 123* 122* 122* 121* 121*  K 5.1 5.1 4.5 4.3 4.2  CL 86* 86* 85* 85* 84*  CO2 28 28 29 27 30   GLUCOSE 113* 67* 73 94 111*  BUN 36* 35* 43* 51* 43*  CREATININE 1.10 1.01 0.99 1.14 1.01  CALCIUM 8.3* 8.3* 8.3* 8.1* 7.4*  PHOS  --   --   --  4.9* 2.7   Liver Function Tests:  Recent Labs Lab 10/18/13 0441 10/19/13 0358 10/20/13 0415 10/23/13 0519 10/24/13 0824  AST 55* 58* 62* 58*  --   ALT 36 38 41 39  --   ALKPHOS 97 94 95 110  --   BILITOT 3.1* 2.8* 3.4* 2.3*  --   PROT 6.9 6.8 6.9 6.5  --   ALBUMIN 1.8* 1.7* 1.8* 1.7*  1.6* 1.8*   CBC:  Recent Labs Lab 10/22/13 0332 10/23/13 0519 10/23/13 1810 10/23/13 2125 10/24/13 0046 10/24/13 0337  WBC 9.4 9.8  --   --  7.3 7.7  NEUTROABS  --  6.6  --   --   --   --   HGB 10.7* 10.9*  --   --  9.2* 9.1*  HCT 30.1* 29.9*  --   --  25.8* 25.1*  MCV 89.3 87.2  --   --  90.5 86.6  PLT 111* 132* 93* 94* 83*  82* 92*  88*   BNP (last 3  results)  Recent Labs  09/02/13 0520 09/09/13 0430 10/07/13 1029  PROBNP 348.5* 383.9* 241.7*   CBG:  Recent Labs Lab 10/19/13 2109 10/20/13 0608 10/20/13 1124 10/20/13 1633 10/20/13 2136  GLUCAP 108* 75 86 126* 145*     Scheduled Meds: . sodium chloride   Intravenous Once  . sodium chloride   Intravenous Once  . clonazePAM  0.5 mg Oral QHS  . fentaNYL  25 mcg Transdermal Q72H  . folic acid  1 mg Oral Daily  . furosemide  80 mg Oral BID  . lactulose  30 g Oral Daily  . pantoprazole  40 mg Oral Q0600  . sodium chloride  3 mL Intravenous Q12H  . spironolactone  25 mg Oral BID  . tamsulosin  0.4 mg Oral Daily  . thiamine  100 mg Oral Daily  . vancomycin  1,000 mg Intravenous Q12H   Continuous Infusions:   Principal Problem:   Recurrent right pleural effusion Active Problems:   Hyponatremia   possible encephalopathy, hepatic   Ascites due to alcoholic  cirrhosis   BPH (benign prostatic hyperplasia)   Liver cirrhosis   Alcohol abuse, in remission   Hepatitis C   Acute respiratory failure with hypoxia   Weakness generalized   Palliative care encounter   Abdominal pain   Esophageal ulcer   Time spent: 20 mins   Osvaldo ShipperKRISHNAN,Damika Harmon, MD  [facility] Pager [phone]  If 7PM-7AM, please contact night-coverage [email or web address] Password [email or web address] 10/24/2013, 10:12 AM    LOS: 13 days

## 2013-10-25 LAB — DIC (DISSEMINATED INTRAVASCULAR COAGULATION) PANEL
APTT: 56 s — AB (ref 24–37)
Fibrinogen: <60 mg/dL — CL (ref 204–475)
INR: 3.38 — ABNORMAL HIGH (ref 0.00–1.49)
INR: 3.46 — AB (ref 0.00–1.49)
PLATELETS: 81 10*3/uL — AB (ref 150–400)
PROTHROMBIN TIME: 34.5 s — AB (ref 11.6–15.2)
PROTHROMBIN TIME: 35.1 s — AB (ref 11.6–15.2)
Platelets: 86 10*3/uL — ABNORMAL LOW (ref 150–400)
Smear Review: NONE SEEN

## 2013-10-25 LAB — PREPARE PLATELET PHERESIS
Unit division: 0
Unit division: 0

## 2013-10-25 LAB — DIC (DISSEMINATED INTRAVASCULAR COAGULATION)PANEL
D-Dimer, Quant: >20 ug/mL-FEU — ABNORMAL HIGH (ref 0.00–0.48)
Fibrinogen: <60 mg/dL — CL (ref 204–475)
Fibrinogen: <60 mg/dL — CL (ref 204–475)
INR: 3.24 — ABNORMAL HIGH (ref 0.00–1.49)
INR: 3.12 — ABNORMAL HIGH (ref 0.00–1.49)
Platelets: 100 10*3/uL — ABNORMAL LOW (ref 150–400)
Platelets: 72 10*3/uL — ABNORMAL LOW (ref 150–400)
Prothrombin Time: 33.3 seconds — ABNORMAL HIGH (ref 11.6–15.2)
Prothrombin Time: 32.4 seconds — ABNORMAL HIGH (ref 11.6–15.2)
Smear Review: NONE SEEN
Smear Review: NONE SEEN
Smear Review: NONE SEEN
aPTT: 56 seconds — ABNORMAL HIGH (ref 24–37)
aPTT: 62 seconds — ABNORMAL HIGH (ref 24–37)
aPTT: 53 seconds — ABNORMAL HIGH (ref 24–37)

## 2013-10-25 LAB — CBC
HCT: 28.4 % — ABNORMAL LOW (ref 39.0–52.0)
Hemoglobin: 10 g/dL — ABNORMAL LOW (ref 13.0–17.0)
MCH: 31 pg (ref 26.0–34.0)
MCHC: 35.2 g/dL (ref 30.0–36.0)
MCV: 87.9 fL (ref 78.0–100.0)
Platelets: 86 10*3/uL — ABNORMAL LOW (ref 150–400)
RBC: 3.23 MIL/uL — ABNORMAL LOW (ref 4.22–5.81)
RDW: 14.6 % (ref 11.5–15.5)
WBC: 6.7 10*3/uL (ref 4.0–10.5)

## 2013-10-25 LAB — BASIC METABOLIC PANEL
Anion gap: 8 (ref 5–15)
BUN: 46 mg/dL — AB (ref 6–23)
CO2: 31 meq/L (ref 19–32)
Calcium: 7.8 mg/dL — ABNORMAL LOW (ref 8.4–10.5)
Chloride: 87 mEq/L — ABNORMAL LOW (ref 96–112)
Creatinine, Ser: 1.22 mg/dL (ref 0.50–1.35)
GFR calc Af Amer: 75 mL/min — ABNORMAL LOW (ref >90)
GFR, EST NON AFRICAN AMERICAN: 65 mL/min — AB (ref >90)
GLUCOSE: 77 mg/dL (ref 70–99)
Potassium: 3.2 mEq/L — ABNORMAL LOW (ref 3.7–5.3)
SODIUM: 126 meq/L — AB (ref 137–147)

## 2013-10-25 MED ORDER — POTASSIUM CHLORIDE CRYS ER 20 MEQ PO TBCR
40.0000 meq | EXTENDED_RELEASE_TABLET | Freq: Every day | ORAL | Status: DC
  Administered 2013-10-25: 40 meq via ORAL
  Filled 2013-10-25 (×2): qty 2

## 2013-10-25 MED ORDER — SACCHAROMYCES BOULARDII 250 MG PO CAPS
250.0000 mg | ORAL_CAPSULE | Freq: Two times a day (BID) | ORAL | Status: DC
  Administered 2013-10-25 – 2013-10-28 (×7): 250 mg via ORAL
  Filled 2013-10-25 (×8): qty 1

## 2013-10-25 MED ORDER — AMOXICILLIN 500 MG PO CAPS
500.0000 mg | ORAL_CAPSULE | Freq: Three times a day (TID) | ORAL | Status: DC
  Administered 2013-10-25 – 2013-10-28 (×10): 500 mg via ORAL
  Filled 2013-10-25 (×12): qty 1

## 2013-10-25 NOTE — Progress Notes (Signed)
PROGRESS NOTE    Xavier Valentine:[id number] DOB: [birth year] DOA: 10/11/2013  PCP: [name], MD  HPI/Brief narrative 53 year old male with history of hep C/EtOH cirrhosis, coagulopathy, thrombocytopenia, right-sided transudative pleural effusions s/p thoracentesis x1 on 09/27/13, discharged from hospital on 10/10/13, are admitted on 10/11/13 secondary to worsening left-sided chest wall pain and noted to have large right-sided pleural effusion. During previous admission, pulmonology had recommended treating his pleural effusion with Lasix and holding of thoracentesis. Pulmonology was consulted again patient undergoing repeated right thoracentesis by IR. Palliative care consulted for goals of care on 10/13. Patient underwent EGD and placement of [place] shunt. He is experiencing low grade DIC which is being monitored closely.   Assessment/Plan:  1. Hep C/ETOH cirrhosis/coagulopathy/thrombocytopenia/? Encephalopathy: As per patient, has not had any alcohol for 2.5 months. Rifaximin stopped by GI. Continue  Lactulose. Ammonia normal. Patient apparently had followup appointment with local hepatology clinic and GI and has never followed up. GI has been following inpatient and has now signed off. Follow up with Dr. Christella Hartigan in 4 weeks. EGD 10/21 for evaluation of varices found no varices. Esophageal ulcer noted. Patient underwent placement of [place] shunt by IR on 10/22. Patient education on how to use the shunt. 2. DIC: Low grade DIC noted which can occur after placement of [place] shunt. Discussed with Dr. Clelia Croft on 10/23 and appreciate his input. No FFP unless he has bleeding. Platelet transfusion only if counts drop below 75k. DIC panel every 6 hours for 48 hrs. Patient remains stable. 3. 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 Pulmonology was consulted and initially placed him on IV Lasix and oral Aldactone, without significant  improvement. Aldactone discontinued secondary to hyperkalemia. Patient underwent multiple thoracentesis but always reaccumulated. Pulmonary advised no chest tube due to difficulty removing (hepatic hydrothorax) and consider antibiotics if declines (fever, leukocytosis). Pleural fluid culture negative to date. Underwent left thoracocentesis on 10/14. Repeat cxr shows no left sided effusion. Stable from respiratory standpoint. 4. Left sided chest pain/ 7 & 8 rib fractures: This was made worse by dyspnea from problem #1. Pain management and monitor. Improved. 5. 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 Sodium level is better today. Fluid restriction. Follow BMP closely. Nephrology assisting. This might be a new baseline for him. 6. History of alcohol abuse: Ativan protocol. No overt withdrawal. 7. Ascites: recent ultrasound apparently had not enough ascites to tap. Denies abdominal pain. No features suggestive of SBP. Monitor. 8. Failure to thrive: Seen by PMT. Patient wants to pursue all treatment options. DNR. 9. Hyperkalemia: Resolved. Kayexalate was given and Aldactone was stopped.  10. Distal Esophageal Ulcer: Noted on EGD. On PPI. May need repeat EGD in 8 weeks.   DVT Prophylaxis: SCD's Code Status: DO NOT RESUSCITATE Family Communication: Discussed with patient.  Disposition Plan: Home when medically stable. Will need to be here till Monday. If DIC labs remains stable and he remains asymptomatic, he could be discharged at that time. Will need follow up with Pulmonology/GI/IR/?Nephrology.   Consultants:  Pulmonology  Palliative care team-pending  Gastroenterology  IR  Nephrology  Procedures: Therapeutic thoracentesis 10/11, 10/12, 10/14  EGD 10/21  ENDOSCOPIC IMPRESSION:  1. Distal esophageal ulcer. Likely peptic. Status post biopsy  2. NO esophageal varices... Mild portal gastropathy  [place] Shunt Placement  10/22  Antibiotics:  None  Subjective: Feels better today. He is being compliant with the shunt pump. Pain is better.  Objective: Filed Vitals:   10/24/13 1623 10/24/13  1949 10/25/13 0602 10/25/13 0810  BP: 112/66 114/71 97/51 103/62  Pulse: 83 80 77 88  Temp: 98.4 F (36.9 C) 98 F (36.7 C) 98 F (36.7 C)   TempSrc: Oral Oral Oral   Resp: 18 18 18    Height:      Weight:   79.334 kg (174 lb 14.4 oz)   SpO2: 90% 94% 90%     Intake/Output Summary (Last 24 hours) at 10/25/13 1034 Last data filed at 10/25/13 1026  Gross per 24 hour  Intake 1155.5 ml  Output      0 ml  Net 1155.5 ml   Filed Weights   10/23/13 0547 10/24/13 0500 10/25/13 0602  Weight: 83.5 kg (184 lb 1.4 oz) 80.377 kg (177 lb 3.2 oz) 79.334 kg (174 lb 14.4 oz)     Exam:  General exam: chronically ill-looking male lying comfortably on bed.  Respiratory system: improved air entry but diminished at bases.  Cardiovascular system: S1 & S2 heard, RRR. No JVD, murmurs, gallops, clicks or pedal edema. Dressing noted over right lower chest. Gastrointestinal system: Abdomen is mildly distended, soft and nontender.  Central nervous system: Alert and oriented. No focal neurological deficits.  Data Reviewed: Basic Metabolic Panel:  Recent Labs Lab 10/20/13 1105 10/22/13 0920 10/23/13 0519 10/24/13 0824 10/25/13 0502  NA 122* 122* 121* 121* 126*  K 5.1 4.5 4.3 4.2 3.2*  CL 86* 85* 85* 84* 87*  CO2 28 29 27 30 31   GLUCOSE 67* 73 94 111* 77  BUN 35* 43* 51* 43* 46*  CREATININE 1.01 0.99 1.14 1.01 1.22  CALCIUM 8.3* 8.3* 8.1* 7.4* 7.8*  PHOS  --   --  4.9* 2.7  --    Liver Function Tests:  Recent Labs Lab 10/19/13 0358 10/20/13 0415 10/23/13 0519 10/24/13 0824  AST 58* 62* 58*  --   ALT 38 41 39  --   ALKPHOS 94 95 110  --   BILITOT 2.8* 3.4* 2.3*  --   PROT 6.8 6.9 6.5  --   ALBUMIN 1.7* 1.8* 1.7*  1.6* 1.8*   CBC:  Recent Labs Lab 10/22/13 0332 10/23/13 0519  10/24/13 0046  10/24/13 0337 10/24/13 1910 10/24/13 2301 10/25/13 0502  WBC 9.4 9.8  --  7.3 7.7  --   --  6.7  NEUTROABS  --  6.6  --   --   --   --   --   --   HGB 10.7* 10.9*  --  9.2* 9.1*  --   --  10.0*  HCT 30.1* 29.9*  --  25.8* 25.1*  --   --  28.4*  MCV 89.3 87.2  --  90.5 86.6  --   --  87.9  PLT 111* 132*  < > 83*  82* 92*  88* 96* 100* 86*  81*  < > = values in this interval not displayed. BNP (last 3 results)  Recent Labs  09/02/13 0520 09/09/13 0430 10/07/13 1029  PROBNP 348.5* 383.9* 241.7*   CBG:  Recent Labs Lab 10/19/13 2109 10/20/13 0608 10/20/13 1124 10/20/13 1633 10/20/13 2136  GLUCAP 108* 75 86 126* 145*     Scheduled Meds: . sodium chloride   Intravenous Once  . sodium chloride   Intravenous Once  . clonazePAM  0.5 mg Oral QHS  . fentaNYL  25 mcg Transdermal Q72H  . folic acid  1 mg Oral Daily  . furosemide  160 mg Oral BID  . lactulose  30  g Oral Daily  . pantoprazole  40 mg Oral Q0600  . potassium chloride  40 mEq Oral Daily  . sodium chloride  3 mL Intravenous Q12H  . tamsulosin  0.4 mg Oral Daily  . thiamine  100 mg Oral Daily   Continuous Infusions:   Principal Problem:   Recurrent right pleural effusion Active Problems:   Hyponatremia   possible encephalopathy, hepatic   Ascites due to alcoholic cirrhosis   BPH (benign prostatic hyperplasia)   Liver cirrhosis   Alcohol abuse, in remission   Hepatitis C   Acute respiratory failure with hypoxia   Weakness generalized   Palliative care encounter   Abdominal pain   Esophageal ulcer   Time spent: 20 mins   Osvaldo ShipperKRISHNAN,Michalene Debruler, MD  [facility] Pager [phone]  If 7PM-7AM, please contact night-coverage [email or web address] Password [email or web address] 10/25/2013, 10:34 AM    LOS: 14 days

## 2013-10-25 NOTE — Progress Notes (Addendum)
PT stated "I have no pee'd today after the lasix you gave me", pt states he has no urgency to go, pt bladder scanned and 177 ml found, will continue to monitor closely Per pt request, MD notified, no orders recieved Archie BalboaStein, Alezandra Egli G, RN

## 2013-10-25 NOTE — Progress Notes (Signed)
  Subjective: Feeling good, no complaints, ready to go home   Objective: Vital signs in last 24 hours: Temp:  [98 F (36.7 C)-98.8 F (37.1 C)] 98 F (36.7 C) (10/24 0602) Pulse Rate:  [77-88] 77 (10/24 0602) Resp:  [18-20] 18 (10/24 0602) BP: (91-126)/(45-71) 97/51 mmHg (10/24 0602) SpO2:  [90 %-95 %] 90 % (10/24 0602) Weight:  [174 lb 14.4 oz (79.334 kg)] 174 lb 14.4 oz (79.334 kg) (10/24 0602) Weight change: -2 lb 4.8 oz (-1.043 kg)  Intake/Output from previous day: 10/23 0701 - 10/24 0700 In: 915.5 [P.O.:360; I.V.:20; Blood:535.5] Out: 450 [Urine:450] Intake/Output this shift:    General appearance: alert and cooperative GI: marked ascites Extremities: 2+ edema BLE to the ankles  Lab Results:  Recent Labs  10/24/13 0337  10/24/13 2301 10/25/13 0502  WBC 7.7  --   --  6.7  HGB 9.1*  --   --  10.0*  HCT 25.1*  --   --  28.4*  PLT 92*  88*  < > 100* 86*  81*  < > = values in this interval not displayed. BMET:   Recent Labs  10/24/13 0824 10/25/13 0502  NA 121* 126*  K 4.2 3.2*  CL 84* 87*  CO2 30 31  GLUCOSE 111* 77  BUN 43* 46*  CREATININE 1.01 1.22  CALCIUM 7.4* 7.8*   No results found for this basename: PTH,  in the last 72 hours Iron Studies: No results found for this basename: IRON, TIBC, TRANSFERRIN, FERRITIN,  in the last 72 hours  Scheduled: . sodium chloride   Intravenous Once  . sodium chloride   Intravenous Once  . clonazePAM  0.5 mg Oral QHS  . fentaNYL  25 mcg Transdermal Q72H  . folic acid  1 mg Oral Daily  . furosemide  160 mg Oral BID  . lactulose  30 g Oral Daily  . pantoprazole  40 mg Oral Q0600  . sodium chloride  3 mL Intravenous Q12H  . tamsulosin  0.4 mg Oral Daily  . thiamine  100 mg Oral Daily     LOS: 14 days    Assessment/Plan:  53yo male with alcoholic cirrhosis, ESLD and and recurrent pleural effusions. Nephrology consulted for chronic hyponatremia. 1. Hyponatremia, hypervolemic- in setting of  cirrhosis/ascites/anasarca. Modest improvement from yesterday Cont with Fluid restrict to 1 liter/day Continue lasix, spironolactone and metolazone Replete K today, caution given spiro and hypekalemia recently 2. Cirrhosis- No varices on endo but esophageal ulcer, likely peptic.  Started on PPI 3.   Ascites/anasarca/pleaural effusion- s/p [place] shunt and thoracentesis 4.   DIC - mild, asymptomatic, heme on board and monitoring DIC panels   Beverely LowElena [name], MD, MPH [facility] PGY-2 10/25/2013 10:56 AM  Renal Attending  Sodium rising.On 3 diuretics and had a recent thoracentesis.  Will need to be cautious and not overdiurese. Tavonte Seybold C

## 2013-10-25 NOTE — Progress Notes (Signed)
Patient ID: Xavier Valentine, male   DOB: 03-06-1960, 53 y.o.   MRN: [id number]   Referring Physician(s): TRH  Subjective: Pt doing well; denies sig abd pain, N/V , dyspnea or bleeding   Allergies: Bee venom  Medications: Prior to Admission medications   Medication Sig Start Date End Date Taking? Authorizing Provider  albuterol (PROVENTIL HFA;VENTOLIN HFA) 108 (90 BASE) MCG/ACT inhaler Inhale 2 puffs into the lungs daily as needed for wheezing or shortness of breath.   Yes Historical Provider, MD  clonazePAM (KLONOPIN) 0.5 MG tablet Take 1 tablet (0.5 mg total) by mouth daily as needed for anxiety. 10/09/13  Yes Leroy SeaPrashant K Singh, MD  docusate sodium (COLACE) 100 MG capsule Take 1 capsule (100 mg total) by mouth 2 (two) times daily as needed for mild constipation. Hold if you have diarrhea. 10/01/13  Yes Tora KindredMarianne L York, PA-C  folic acid (FOLVITE) 1 MG tablet Take 1 tablet (1 mg total) by mouth daily. 09/09/13  Yes Clydia LlanoMutaz Elmahi, MD  furosemide (LASIX) 40 MG tablet Take 2 tablets (80 mg total) by mouth 2 (two) times daily. 10/10/13  Yes Leroy SeaPrashant K Singh, MD  lactulose (CHRONULAC) 10 GM/15ML solution Take 45 mLs (30 g total) by mouth daily. 10/01/13  Yes Marianne L York, PA-C  Multiple Vitamin (MULTIVITAMIN WITH MINERALS) TABS tablet Take 1 tablet by mouth daily. 09/09/13  Yes Clydia LlanoMutaz Elmahi, MD  oxyCODONE (ROXICODONE) 5 MG immediate release tablet Take 1 tablet (5 mg total) by mouth every 4 (four) hours as needed for severe pain. 10/09/13  Yes Leroy SeaPrashant K Singh, MD  spironolactone (ALDACTONE) 100 MG tablet Take 1 tablet (100 mg total) by mouth 2 (two) times daily. 10/10/13  Yes Leroy SeaPrashant K Singh, MD  tamsulosin (FLOMAX) 0.4 MG CAPS capsule Take 1 capsule (0.4 mg total) by mouth daily. 09/23/13  Yes Tora KindredMarianne L York, PA-C  thiamine 100 MG tablet Take 1 tablet (100 mg total) by mouth daily. 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 Not a candidate for TIPS.  EXAM: ULTRASOUND GUIDED PERITONEAL ACCESS FOR PARACENTESIS.  ULTRASOUND-GUIDED PLEURAL ACCESS FOR THORACENTESIS.  ULTRASOUND GUIDED RIGHT INTERNAL JUGULAR ACCESS FOR SHUNT PLACEMENT.  DENVER SHUNT (PERITONEAL TO VENOUS) TUNNELED CATHETER -- [id number] -- WITH COMPLICATING MEDICAL CONDITIONS.  CLINICAL DATA:  53 year old male with a history of hepatitis-C and alcoholic cirrhosis. He has been admitted on October 12 with decompensated recurrent ascites. The patient has a MELD score of at least 18, with Child Pugh category C cirrhosis/liver disease, with mild encephalopathy, and is not a candidate for TIPS.  He has been failing medical therapy for recurrent ascites and a right-sided hepatic hydrothorax. He has become hyponatremic with multiple medical problems.  Previous right-sided thoracentesis has yielded 1.5 L October 13, 2013 and 2.0 L October 15, 2013.  Complicating medical history includes cirrhosis, ascites, thrombocytopenia, coagulopathy, hepatitis-C, prior inguinal hernia and chronic alcoholism.  MEDICATIONS: Fentanyl 50 mcg IV; Versed 1.5 mg IV  ANESTHESIA/SEDATION: Sedation Time  116 minutes  Fluoro time: 36 seconds  CONTRAST:  None  COMPLICATIONS: None immediate.  PROCEDURE: Informed consent was obtained from the patient following an explanation of the procedure, risks, benefits and alternatives. Risks discussed included bleeding, infection, sepsis, shunt failure, requirement for shunt replacement, additional procedures, disseminated intravascular coagulopathy/coagulation (DIC),  cardiopulmonary collapse, death. The patient understands, agrees and consents for the procedure. All questions were addressed. A time out was performed prior to the initiation of the procedure. The patient was positioned supine on the fluoroscopy table for access to both the right pleural space and the peritoneal space, as well as the right internal jugular vein. The operative site was prepped and draped in the usual sterile fashion.  Ultrasound survey of the abdomen was performed with images stored and sent to PACs.  A suitable approach into the peritoneum was determined for placement of a Yueh needle. The needle was advanced into the peritoneum and ascitic fluid was returned. A 0.035 wire was advanced and view needle was removed. Serial dilation with 12 French dilator was performed before placing a 16 French peel-away sheath. The peel-away sheath was then attached to vacuum aspiration for paracentesis.  Ultrasound guided as placement of a second Yueh needle was then performed into the right pleural space. Once the needle was removed the catheter was attached to vacuum aspiration for large volume thoracentesis.  Ultrasound survey of the right internal jugular vein was then performed with images stored and sent to PACs. A micropuncture needle was then used access the right internal jugular vein after infiltration of the skin and subcutaneous tissues with 1% lidocaine without epinephrine. Once we have established access to the right internal jugular vein, serial dilation of the tract with 12 [place] dilator was performed, with subsequent placement of 16 French peel-away sheath.  A site on the right lower thorax at the costochondral junction was selected for placement of the shunt pump. A 3 cm incision was performed, with blunt dissection used to create pocket for the pump.  A 2 L bag of body-temperature saline was then infused into the peritoneal space after complete evacuation of the peritoneal ascites. This fluid  was then partially evacuated with vacuum suction, with approximately 500 cc of saline remaining.  The peritoneal limb of the shunt catheter was then back tunneled from the pocket towards the puncture site on the right upper quadrant. The catheter was pulled through the tract and the tunnel were removed. The catheter was then placed through the peel-away sheath and the peel-away sheath was removed.  The shunt pump was then positioned in the pocket, with 2 Prolene retention sutures placed. The venous limb was tunneled along the chest wall after infiltration of the subcutaneous tissues. The catheter was pulled through the neck puncture site. The required length of the venous limb was determined by placing a J wire through the peel-away sheath and measuring to the right atrium.  The venous limb was amputated at the proper length, and then the entire shunt catheter was primed with the remaining ascitic fluid/saline in the abdomen.  The venous limb was placed through the peel-away at the right IJ and into the right atrium.  A final image was stored.  The shunt pump pocket was closed with a deep layer of Vicryl resorbable suture, a writing subcuticular layer with Monocryl, and interrupted Prolene sutures to assure that the wound does not dehisce with the pressure of pumping the catheter. The peritoneal insertion site was closed with a  Vicryl deep suture an a Monocryl suture as well as Derma bond, and the neck incision site was sealed with Dermabond.  The patient tolerated the procedure well and remained hemodynamically stable throughout.  No complications were encountered and no significant blood loss was encountered.  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  PLAN: The patient will need to pump the shunt pump as instructed 20 times in the morning and 20 times in the evening. It is best in the short term if this is  done at least every 3-4 hours to assure that there is no debris within the shunt tubing and shunt pump.  A DIC panel will be ordered every 4 hours starting at 9 p.m. on the day of the procedure to assure that the patient does not become coagulopathic. The highest risk is within the first 24 hours.  The patient will receive 7 days of antibiotics. The first 24 hours may be IV.  Pain control.  Follow-up appointment in 7-14 days after discharge with Dr. Loreta Ave in [facility]. Patient may require occasional repeat thoracentesis over the next several weeks. This need should decrease with successful use of the shunt.  Signed,  Yvone Neu. Loreta Ave, DO  [facility]  [facility]   Electronically Signed   By: Gilmer Mor D.O.   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 Not a candidate for TIPS.  EXAM: ULTRASOUND GUIDED PERITONEAL ACCESS FOR PARACENTESIS.  ULTRASOUND-GUIDED PLEURAL ACCESS FOR THORACENTESIS.  ULTRASOUND GUIDED RIGHT INTERNAL JUGULAR ACCESS FOR SHUNT PLACEMENT.  DENVER SHUNT (PERITONEAL TO VENOUS) TUNNELED CATHETER -- 16109 -- WITH COMPLICATING MEDICAL CONDITIONS.  CLINICAL DATA:  53 year old male with a history of hepatitis-C and alcoholic cirrhosis. He has been admitted on October 12 with decompensated recurrent ascites. The patient has a MELD score of at least 18, with Child Pugh category C cirrhosis/liver disease, with mild encephalopathy, and is not a candidate for TIPS.  He has been failing medical therapy for recurrent ascites and a right-sided hepatic hydrothorax. He has become hyponatremic with multiple medical problems.  Previous right-sided thoracentesis has yielded 1.5 L October 13, 2013 and 2.0 L October 15, 2013.  Complicating medical history includes cirrhosis, ascites, thrombocytopenia, coagulopathy, hepatitis-C, prior  inguinal hernia and chronic alcoholism.  MEDICATIONS: Fentanyl 50 mcg IV; Versed 1.5 mg IV  ANESTHESIA/SEDATION: Sedation Time  116 minutes  Fluoro time: 36 seconds  CONTRAST:  None  COMPLICATIONS: None immediate.  PROCEDURE: Informed consent was obtained from the patient following an explanation of the procedure, risks, benefits and alternatives. Risks discussed included bleeding, infection, sepsis, shunt failure, requirement for shunt replacement, additional procedures, disseminated intravascular coagulopathy/coagulation (DIC), cardiopulmonary collapse, death. The patient understands, agrees and consents for the procedure. All questions were addressed. A time out was performed prior to the initiation of the procedure. The patient was positioned supine on the fluoroscopy table for access to both the right pleural space and the peritoneal space, as well as the right internal jugular vein. The operative site was prepped and draped in the usual sterile fashion.  Ultrasound survey of the abdomen was performed with images stored and sent to PACs.  A suitable approach into the peritoneum was determined for placement of a Yueh needle. The needle was advanced into the peritoneum and ascitic fluid was returned. A  0.035 wire was advanced and view needle was removed. Serial dilation with 12 French dilator was performed before placing a 16 French peel-away sheath. The peel-away sheath was then attached to vacuum aspiration for paracentesis.  Ultrasound guided as placement of a second Yueh needle was then performed into the right pleural space. Once the needle was removed the catheter was attached to vacuum aspiration for large volume thoracentesis.  Ultrasound survey of the right internal jugular vein was then performed with images stored and sent to PACs. A micropuncture needle was then used access the right internal jugular vein after infiltration of the skin and subcutaneous tissues with 1% lidocaine without epinephrine.  Once we have established access to the right internal jugular vein, serial dilation of the tract with 12 [place] dilator was performed, with subsequent placement of 16 French peel-away sheath.  A site on the right lower thorax at the costochondral junction was selected for placement of the shunt pump. A 3 cm incision was performed, with blunt dissection used to create pocket for the pump.  A 2 L bag of body-temperature saline was then infused into the peritoneal space after complete evacuation of the peritoneal ascites. This fluid was then partially evacuated with vacuum suction, with approximately 500 cc of saline remaining.  The peritoneal limb of the shunt catheter was then back tunneled from the pocket towards the puncture site on the right upper quadrant. The catheter was pulled through the tract and the tunnel were removed. The catheter was then placed through the peel-away sheath and the peel-away sheath was removed.  The shunt pump was then positioned in the pocket, with 2 Prolene retention sutures placed. The venous limb was tunneled along the chest wall after infiltration of the subcutaneous tissues. The catheter was pulled through the neck puncture site. The required length of the venous limb was determined by placing a J wire through the peel-away sheath and measuring to the right atrium.  The venous limb was amputated at the proper length, and then the entire shunt catheter was primed with the remaining ascitic fluid/saline in the abdomen.  The venous limb was placed through the peel-away at the right IJ and into the right atrium.  A final image was stored.  The shunt pump pocket was closed with a deep layer of Vicryl resorbable suture, a writing subcuticular layer with Monocryl, and interrupted Prolene sutures to assure that the wound does not dehisce with the pressure of pumping the catheter. The peritoneal insertion site was closed with a Vicryl deep suture an a Monocryl suture as well as Derma bond,  and the neck incision site was sealed with Dermabond.  The patient tolerated the procedure well and remained hemodynamically stable throughout.  No complications were encountered and no significant blood loss was encountered.  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  PLAN: The patient will need to pump the shunt pump as instructed 20 times in the morning and 20 times in the evening. It is best in the short term if this is done at least every 3-4 hours to assure that there is no debris within the shunt tubing and shunt pump.  A DIC panel will be ordered every 4 hours starting at 9 p.m. on the day of the procedure to assure that the patient does not become coagulopathic. The highest risk is within the first 24 hours.  The patient will receive 7 days of antibiotics. The  first 24 hours may be IV.  Pain control.  Follow-up appointment in 7-14 days after discharge with Dr. Loreta Ave in [facility]. Patient may require occasional repeat thoracentesis over the next several weeks. This need should decrease with successful use of the shunt.  Signed,  Yvone Neu. Loreta Ave, DO  [facility]  [name]   Electronically Signed   By: Gilmer Mor D.O.   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 Not a candidate for TIPS.  EXAM: ULTRASOUND GUIDED PERITONEAL ACCESS FOR PARACENTESIS.  ULTRASOUND-GUIDED PLEURAL ACCESS FOR THORACENTESIS.  ULTRASOUND GUIDED RIGHT INTERNAL JUGULAR ACCESS FOR SHUNT PLACEMENT.  DENVER SHUNT (PERITONEAL TO VENOUS) TUNNELED CATHETER -- 16109 -- WITH COMPLICATING MEDICAL CONDITIONS.  CLINICAL DATA:  53 year old male with a history of hepatitis-C and alcoholic cirrhosis. He has been admitted on October 12 with decompensated recurrent  ascites. The patient has a MELD score of at least 18, with Child Pugh category C cirrhosis/liver disease, with mild encephalopathy, and is not a candidate for TIPS.  He has been failing medical therapy for recurrent ascites and a right-sided hepatic hydrothorax. He has become hyponatremic with multiple medical problems.  Previous right-sided thoracentesis has yielded 1.5 L October 13, 2013 and 2.0 L October 15, 2013.  Complicating medical history includes cirrhosis, ascites, thrombocytopenia, coagulopathy, hepatitis-C, prior inguinal hernia and chronic alcoholism.  MEDICATIONS: Fentanyl 50 mcg IV; Versed 1.5 mg IV  ANESTHESIA/SEDATION: Sedation Time  116 minutes  Fluoro time: 36 seconds  CONTRAST:  None  COMPLICATIONS: None immediate.  PROCEDURE: Informed consent was obtained from the patient following an explanation of the procedure, risks, benefits and alternatives. Risks discussed included bleeding, infection, sepsis, shunt failure, requirement for shunt replacement, additional procedures, disseminated intravascular coagulopathy/coagulation (DIC), cardiopulmonary collapse, death. The patient understands, agrees and consents for the procedure. All questions were addressed. A time out was performed prior to the initiation of the procedure. The patient was positioned supine on the fluoroscopy table for access to both the right pleural space and the peritoneal space, as well as the right internal jugular vein. The operative site was prepped and draped in the usual sterile fashion.  Ultrasound survey of the abdomen was performed with images stored and sent to PACs.  A suitable approach into the peritoneum was determined for placement of a Yueh needle. The needle was advanced into the peritoneum and ascitic fluid was returned. A 0.035 wire was advanced and view needle was removed. Serial dilation with 12 French dilator was performed before placing a 16 French peel-away sheath. The peel-away sheath was then attached to  vacuum aspiration for paracentesis.  Ultrasound guided as placement of a second Yueh needle was then performed into the right pleural space. Once the needle was removed the catheter was attached to vacuum aspiration for large volume thoracentesis.  Ultrasound survey of the right internal jugular vein was then performed with images stored and sent to PACs. A micropuncture needle was then used access the right internal jugular vein after infiltration of the skin and subcutaneous tissues with 1% lidocaine without epinephrine. Once we have established access to the right internal jugular vein, serial dilation of the tract with 12 [place] dilator was performed, with subsequent placement of 16 French peel-away sheath.  A site on the right lower thorax at the costochondral junction was selected for placement of the shunt pump. A 3 cm incision was performed,  with blunt dissection used to create pocket for the pump.  A 2 L bag of body-temperature saline was then infused into the peritoneal space after complete evacuation of the peritoneal ascites. This fluid was then partially evacuated with vacuum suction, with approximately 500 cc of saline remaining.  The peritoneal limb of the shunt catheter was then back tunneled from the pocket towards the puncture site on the right upper quadrant. The catheter was pulled through the tract and the tunnel were removed. The catheter was then placed through the peel-away sheath and the peel-away sheath was removed.  The shunt pump was then positioned in the pocket, with 2 Prolene retention sutures placed. The venous limb was tunneled along the chest wall after infiltration of the subcutaneous tissues. The catheter was pulled through the neck puncture site. The required length of the venous limb was determined by placing a J wire through the peel-away sheath and measuring to the right atrium.  The venous limb was amputated at the proper length, and then the entire shunt catheter was primed  with the remaining ascitic fluid/saline in the abdomen.  The venous limb was placed through the peel-away at the right IJ and into the right atrium.  A final image was stored.  The shunt pump pocket was closed with a deep layer of Vicryl resorbable suture, a writing subcuticular layer with Monocryl, and interrupted Prolene sutures to assure that the wound does not dehisce with the pressure of pumping the catheter. The peritoneal insertion site was closed with a Vicryl deep suture an a Monocryl suture as well as Derma bond, and the neck incision site was sealed with Dermabond.  The patient tolerated the procedure well and remained hemodynamically stable throughout.  No complications were encountered and no significant blood loss was encountered.  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  PLAN: The patient will need to pump the shunt pump as instructed 20 times in the morning and 20 times in the evening. It is best in the short term if this is done at least every 3-4 hours to assure that there is no debris within the shunt tubing and shunt pump.  A DIC panel will be ordered every 4 hours starting at 9 p.m. on the day of the procedure to assure that the patient does not become coagulopathic. The highest risk is within the first 24 hours.  The patient will receive 7 days of antibiotics. The first 24 hours may be IV.  Pain control.  Follow-up appointment in 7-14 days after discharge with Dr. Loreta Ave in [facility]. Patient may require occasional repeat thoracentesis over the next several weeks. This need should decrease with successful use of the shunt.  Signed,  Yvone Neu. Loreta Ave, DO  [facility]  [facility] Radiology   Electronically Signed   By: Gilmer Mor D.O.   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 Not a candidate for TIPS.  EXAM: ULTRASOUND GUIDED PERITONEAL ACCESS FOR PARACENTESIS.  ULTRASOUND-GUIDED PLEURAL ACCESS FOR THORACENTESIS.  ULTRASOUND GUIDED RIGHT INTERNAL JUGULAR ACCESS FOR SHUNT PLACEMENT.  DENVER SHUNT (PERITONEAL TO VENOUS) TUNNELED CATHETER -- 16109 -- WITH COMPLICATING MEDICAL CONDITIONS.  CLINICAL DATA:  53 year old male with a history of hepatitis-C and alcoholic cirrhosis. He has been admitted on October 12 with decompensated recurrent ascites.  The patient has a MELD score of at least 18, with Child Pugh category C cirrhosis/liver disease, with mild encephalopathy, and is not a candidate for TIPS.  He has been failing medical therapy for recurrent ascites and a right-sided hepatic hydrothorax. He has become hyponatremic with multiple medical problems.  Previous right-sided thoracentesis has yielded 1.5 L October 13, 2013 and 2.0 L October 15, 2013.  Complicating medical history includes cirrhosis, ascites, thrombocytopenia, coagulopathy, hepatitis-C, prior inguinal hernia and chronic alcoholism.  MEDICATIONS: Fentanyl 50 mcg IV; Versed 1.5 mg IV  ANESTHESIA/SEDATION: Sedation Time  116 minutes  Fluoro time: 36 seconds  CONTRAST:  None  COMPLICATIONS: None immediate.  PROCEDURE: Informed consent was obtained from the patient following an explanation of the procedure, risks, benefits and alternatives. Risks discussed included bleeding, infection, sepsis, shunt failure, requirement for shunt replacement, additional procedures, disseminated intravascular coagulopathy/coagulation (DIC), cardiopulmonary collapse, death. The patient understands, agrees and consents for the procedure. All questions were addressed. A time out was performed prior to the initiation of the procedure. The patient was positioned supine on the fluoroscopy table for access to both the right pleural space and the peritoneal space,  as well as the right internal jugular vein. The operative site was prepped and draped in the usual sterile fashion.  Ultrasound survey of the abdomen was performed with images stored and sent to PACs.  A suitable approach into the peritoneum was determined for placement of a Yueh needle. The needle was advanced into the peritoneum and ascitic fluid was returned. A 0.035 wire was advanced and view needle was removed. Serial dilation with 12 French dilator was performed before placing a 16 French peel-away sheath. The peel-away sheath was then attached to vacuum aspiration for paracentesis.  Ultrasound guided as placement of a second Yueh needle was then performed into the right pleural space. Once the needle was removed the catheter was attached to vacuum aspiration for large volume thoracentesis.  Ultrasound survey of the right internal jugular vein was then performed with images stored and sent to PACs. A micropuncture needle was then used access the right internal jugular vein after infiltration of the skin and subcutaneous tissues with 1% lidocaine without epinephrine. Once we have established access to the right internal jugular vein, serial dilation of the tract with 12 [place]French dilator was performed, with subsequent placement of 16 French peel-away sheath.  A site on the right lower thorax at the costochondral junction was selected for placement of the shunt pump. A 3 cm incision was performed, with blunt dissection used to create pocket for the pump.  A 2 L bag of body-temperature saline was then infused into the peritoneal space after complete evacuation of the peritoneal ascites. This fluid was then partially evacuated with vacuum suction, with approximately 500 cc of saline remaining.  The peritoneal limb of the shunt catheter was then back tunneled from the pocket towards the puncture site on the right upper quadrant. The catheter was pulled through the tract and the tunnel were removed. The catheter was  then placed through the peel-away sheath and the peel-away sheath was removed.  The shunt pump was then positioned in the pocket, with 2 Prolene retention sutures placed. The venous limb was tunneled along the chest wall after infiltration of the subcutaneous tissues. The catheter was pulled through the neck puncture site. The required length of the venous limb was determined by placing a J wire through the peel-away sheath and measuring to the right atrium.  The venous limb  was amputated at the proper length, and then the entire shunt catheter was primed with the remaining ascitic fluid/saline in the abdomen.  The venous limb was placed through the peel-away at the right IJ and into the right atrium.  A final image was stored.  The shunt pump pocket was closed with a deep layer of Vicryl resorbable suture, a writing subcuticular layer with Monocryl, and interrupted Prolene sutures to assure that the wound does not dehisce with the pressure of pumping the catheter. The peritoneal insertion site was closed with a Vicryl deep suture an a Monocryl suture as well as Derma bond, and the neck incision site was sealed with Dermabond.  The patient tolerated the procedure well and remained hemodynamically stable throughout.  No complications were encountered and no significant blood loss was encountered.  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  PLAN: The patient will need to pump the shunt pump as instructed 20 times in the morning and 20 times in the evening. It is best in the short term if this is done at least every 3-4 hours to assure that there is no debris within the shunt tubing and shunt pump.  A DIC panel will be ordered every 4 hours starting at 9 p.m. on the day of the procedure to assure that the patient does not become coagulopathic. The highest risk is within the first 24 hours.  The patient will  receive 7 days of antibiotics. The first 24 hours may be IV.  Pain control.  Follow-up appointment in 7-14 days after discharge with Dr. Loreta Ave in [facility]. Patient may require occasional repeat thoracentesis over the next several weeks. This need should decrease with successful use of the shunt.  Signed,  Yvone Neu. Loreta Ave, DO  [facility]  [facility]   Electronically Signed   By: Gilmer Mor D.O.   On: 10/23/2013 19:42    Labs:  CBC:  Recent Labs  10/23/13 0519  10/24/13 0046 10/24/13 0337 10/24/13 1910 10/24/13 2301 10/25/13 0502 10/25/13 1153  WBC 9.8  --  7.3 7.7  --   --  6.7  --   HGB 10.9*  --  9.2* 9.1*  --   --  10.0*  --   HCT 29.9*  --  25.8* 25.1*  --   --  28.4*  --   PLT 132*  < > 83*  82* 92*  88* 96* 100* 86*  81* 86*  < > = values in this interval not displayed.  COAGS:  Recent Labs  10/24/13 1910 10/24/13 2301 10/25/13 0502 10/25/13 1153  INR 2.96* 3.24* 3.38* 3.12*  APTT 53* 56* 62* 53*    BMP:  Recent Labs  10/22/13 0920 10/23/13 0519 10/24/13 0824 10/25/13 0502  NA 122* 121* 121* 126*  K 4.5 4.3 4.2 3.2*  CL 85* 85* 84* 87*  CO2 29 27 30 31   GLUCOSE 73 94 111* 77  BUN 43* 51* 43* 46*  CALCIUM 8.3* 8.1* 7.4* 7.8*  CREATININE 0.99 1.14 1.01 1.22  GFRNONAA >90 71* 82* 65*  GFRAA >90 82* >90 75*    LIVER FUNCTION TESTS:  Recent Labs  10/18/13 0441 10/19/13 0358 10/20/13 0415 10/23/13 0519 10/24/13 0824  BILITOT 3.1* 2.8* 3.4* 2.3*  --   AST 55* 58* 62* 58*  --   ALT 36 38 41 39  --   ALKPHOS  97 94 95 110  --   PROT 6.9 6.8 6.9 6.5  --   ALBUMIN 1.8* 1.7* 1.8* 1.7*  1.6* 1.8*    Assessment and Plan: S/p placement of [place](peritoneo-venous ) shunt 10/22 for refractory ascites/hepatic right hydrothorax; DIC panels/plts currently stable; cont antbx with either amoxil or preferably clindamycin (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 Loreta Ave.      I spent a total of 15 minutes face to face in clinical consultation/evaluation, greater than 50% of which was counseling/coordinating care for Denver (peritoneo-venous ) shunt  Signed: Aamilah Augenstein,D KEVIN 10/25/2013, 1:10 PM

## 2013-10-25 NOTE — Progress Notes (Signed)
[facility] coverage) paged and notified of critical Fibrinogen lab value < 60. No new orders received, she states to continue to monitor patient for signs of bleeding. Pt resting in bed, no S/S of bleeding noted, will continue to monitor.

## 2013-10-26 DIAGNOSIS — D696 Thrombocytopenia, unspecified: Secondary | ICD-10-CM

## 2013-10-26 LAB — BASIC METABOLIC PANEL
Anion gap: 7 (ref 5–15)
BUN: 55 mg/dL — AB (ref 6–23)
CO2: 31 mEq/L (ref 19–32)
CREATININE: 1.5 mg/dL — AB (ref 0.50–1.35)
Calcium: 7.3 mg/dL — ABNORMAL LOW (ref 8.4–10.5)
Chloride: 86 mEq/L — ABNORMAL LOW (ref 96–112)
GFR, EST AFRICAN AMERICAN: 59 mL/min — AB (ref >90)
GFR, EST NON AFRICAN AMERICAN: 51 mL/min — AB (ref >90)
Glucose, Bld: 122 mg/dL — ABNORMAL HIGH (ref 70–99)
Potassium: 3 mEq/L — ABNORMAL LOW (ref 3.7–5.3)
Sodium: 124 mEq/L — ABNORMAL LOW (ref 137–147)

## 2013-10-26 LAB — DIC (DISSEMINATED INTRAVASCULAR COAGULATION) PANEL
APTT: 48 s — AB (ref 24–37)
INR: 2.89 — AB (ref 0.00–1.49)
Smear Review: NONE SEEN

## 2013-10-26 LAB — DIC (DISSEMINATED INTRAVASCULAR COAGULATION)PANEL
D-Dimer, Quant: >20 ug/mL-FEU — ABNORMAL HIGH (ref 0.00–0.48)
Fibrinogen: <60 mg/dL — CL (ref 204–475)
Platelets: 87 10*3/uL — ABNORMAL LOW (ref 150–400)
Prothrombin Time: 30.5 seconds — ABNORMAL HIGH (ref 11.6–15.2)

## 2013-10-26 MED ORDER — POTASSIUM CHLORIDE CRYS ER 20 MEQ PO TBCR
40.0000 meq | EXTENDED_RELEASE_TABLET | Freq: Once | ORAL | Status: AC
  Administered 2013-10-26: 40 meq via ORAL
  Filled 2013-10-26: qty 2

## 2013-10-26 NOTE — Progress Notes (Signed)
  Subjective: Not peeing as much but feeling fine  Objective: Vital signs in last 24 hours: Temp:  [98.2 F (36.8 C)-98.8 F (37.1 C)] 98.4 F (36.9 C) (10/25 0508) Pulse Rate:  [82-87] 84 (10/25 0508) Resp:  [17-18] 18 (10/24 2033) BP: (91-107)/(39-58) 107/58 mmHg (10/25 0508) SpO2:  [90 %-98 %] 98 % (10/25 0508) Weight:  [176 lb 11.2 oz (80.151 kg)] 176 lb 11.2 oz (80.151 kg) (10/25 0508) Weight change: 1 lb 12.8 oz (0.816 kg)  Intake/Output from previous day: 10/24 0701 - 10/25 0700 In: 1080 [P.O.:1080] Out: 551 [Urine:550; Stool:1] Intake/Output this shift: Total I/O In: 240 [P.O.:240] Out: -   General appearance: alert and cooperative GI: marked ascites Extremities: 2+ edema BLE to mid calf  Lab Results:  Recent Labs  10/24/13 0337  10/25/13 0502  10/25/13 2315 10/26/13 0500  WBC 7.7  --  6.7  --   --   --   HGB 9.1*  --  10.0*  --   --   --   HCT 25.1*  --  28.4*  --   --   --   PLT 92*  88*  < > 86*  81*  < > 89* 77*  < > = values in this interval not displayed. BMET:   Recent Labs  10/25/13 0502 10/26/13 0500  NA 126* 124*  K 3.2* 3.0*  CL 87* 86*  CO2 31 31  GLUCOSE 77 122*  BUN 46* 55*  CREATININE 1.22 1.50*  CALCIUM 7.8* 7.3*   No results found for this basename: PTH,  in the last 72 hours Iron Studies: No results found for this basename: IRON, TIBC, TRANSFERRIN, FERRITIN,  in the last 72 hours  Scheduled: . sodium chloride   Intravenous Once  . sodium chloride   Intravenous Once  . amoxicillin  500 mg Oral 3 times per day  . clonazePAM  0.5 mg Oral QHS  . fentaNYL  25 mcg Transdermal Q72H  . folic acid  1 mg Oral Daily  . lactulose  30 g Oral Daily  . pantoprazole  40 mg Oral Q0600  . saccharomyces boulardii  250 mg Oral BID  . sodium chloride  3 mL Intravenous Q12H  . tamsulosin  0.4 mg Oral Daily  . thiamine  100 mg Oral Daily     LOS: 15 days    Assessment/Plan:  53yo male with alcoholic cirrhosis, ESLD and and recurrent  pleural effusions. Nephrology consulted for chronic hyponatremia. 1. Hyponatremia, hypervolemic- in setting of cirrhosis/ascites/anasarca. Creat bump from yesterday likely hepatorenal/overdiuresis. Cont with Fluid restrict to 1 liter/day Holding diuretics (lasix, spironolactone and metolazone), will need to tolerate chronic hyponatremia to some degree, restart diuretics probably tomorrow at lower dose. Replete K today 2. Cirrhosis- No varices on endo but esophageal ulcer, likely peptic.  Started on PPI 3.   Ascites/anasarca/pleaural effusion- s/p denver shunt and thoracentesis 4.   DIC - mild, asymptomatic, heme on board and monitoring DIC panels   Beverely LowElena [name], MD, MPH [facility] PGY-2 10/26/2013 10:43 AM  Renal Attending: He is s/p peritoneo-venous shunt, 6 liter thoracentesis and aggressive diuresis for volume excess and hyponatremia.  Unfortunately, BUN and creatinine have risen.  Sodium a little worse today to 124 from 126, BUN 55, creat 1.50.  We have stopped diuretics and of course are concerned about potential over diuresis and precipitation of HRS.  He reports decent UOP still.  Will follow and eval and treat as needed. Theona Muhs C

## 2013-10-26 NOTE — Progress Notes (Signed)
Patient with order to discontinue cardiac monitor, [facility] paged to verify, spoke with Daphane ShepherdM. Lynch on call for [facility].  Patient heart rhythm stable overnight, M. Lynch stated to go ahead and remove heart monitor at this time.  Telemetry notified of discontinue order and RN was going to remove patient's heart monitor.

## 2013-10-26 NOTE — Progress Notes (Signed)
PROGRESS NOTE    Xavier Valentine EAV:[id number] DOB: 1960/05/07 DOA: 10/11/2013  PCP: [name], MD  HPI/Brief narrative 53 year old male with history of hep C/EtOH cirrhosis, coagulopathy, thrombocytopenia, right-sided transudative pleural effusions s/p thoracentesis x1 on 09/27/13, discharged from hospital on 10/10/13, are admitted on 10/11/13 secondary to worsening left-sided chest wall pain and noted to have large right-sided pleural effusion. During previous admission, pulmonology had recommended treating his pleural effusion with Lasix and holding of thoracentesis. Pulmonology was consulted again patient undergoing repeated right thoracentesis by IR. Palliative care consulted for goals of care on 10/13. Patient underwent EGD and placement of [place] shunt. He is experiencing low grade DIC which is being monitored closely.   Assessment/Plan:  1. Hep C/ETOH cirrhosis/coagulopathy/thrombocytopenia/Hypoalbuminemia: As per patient, has not had any alcohol for about 3 months. Rifaximin stopped by GI. Continue Lactulose. Ammonia normal. Patient apparently had followup appointment with local hepatology clinic and GI and has never followed up. GI has been following inpatient and has now signed off. Follow up with Dr. Christella Hartigan in 4 weeks. EGD 10/21 for evaluation of varices found no varices. Esophageal ulcer noted. Patient underwent placement of Denver shunt by IR on 10/22. Patient education on how to use the shunt. 2. DIC: Low grade DIC noted which can occur after placement of Denver shunt. Discussed with Dr. Clelia Croft on 10/23 and appreciate his input. No FFP unless he has bleeding. Platelet transfusion only if counts drop below 75k. DIC labs remain stable. Antibiotics recommended by IR and so initialed on Amoxicillin. 3. 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 Pulmonology was consulted and initially placed him on IV Lasix and oral  Aldactone, without significant improvement. Aldactone discontinued secondary to hyperkalemia. Patient underwent multiple thoracentesis but always reaccumulated. Pulmonary advised no chest tube due to difficulty removing (hepatic hydrothorax) and consider antibiotics if declines (fever, leukocytosis). Pleural fluid culture negative to date. Underwent left thoracocentesis on 10/14. Repeat cxr shows no left sided effusion. Stable from respiratory standpoint. 4. Left sided chest pain/ 7 & 8 rib fractures: This was made worse by dyspnea from problem #1. Pain management and monitor. Improved. 5. 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 Sodium level is fluctuating. Nephrology assisting. Some concern about elevated BUN/creatinine. Recheck in Am. 6. History of alcohol abuse: Ativan protocol. No overt withdrawal. 7. Ascites: recent ultrasound apparently had not enough ascites to tap. Denies abdominal pain. No features suggestive of SBP. Monitor. 8. Failure to thrive: Seen by PMT. Patient wants to pursue all treatment options. DNR. 9. Hyperkalemia: Resolved. Kayexalate was given and Aldactone was stopped.  10. Distal Esophageal Ulcer: Noted on EGD. On PPI. May need repeat EGD in 8 weeks.   DVT Prophylaxis: SCD's Code Status: DO NOT RESUSCITATE Family Communication: Discussed with patient.  Disposition Plan: Home when medically stable. Will need follow up with Pulmonology/GI/IR/?Nephrology.   Consultants:  Pulmonology  Palliative care team-pending  Gastroenterology  IR  Nephrology  Procedures: Therapeutic thoracentesis 10/11, 10/12, 10/14  EGD 10/21  ENDOSCOPIC IMPRESSION:  1. Distal esophageal ulcer. Likely peptic. Status post biopsy  2. NO esophageal varices... Mild portal gastropathy  Denver Shunt Placement 10/22  Antibiotics:  None  Subjective: Feels well. Is making urine. He is being compliant  with the shunt pump. Pain is better.  Objective: Filed Vitals:   10/25/13 0810 10/25/13 1441 10/25/13 2033 10/26/13 0508  BP: 103/62 91/50 99/39  107/58  Pulse: 88 82 87 84  Temp:  98.2 F (36.8 C) 98.8 F (37.1 C) 98.4 F (36.9 C)  TempSrc:  Oral Oral Oral  Resp:  17 18   Height:      Weight:    80.151 kg (176 lb 11.2 oz)  SpO2:  92% 90% 98%    Intake/Output Summary (Last 24 hours) at 10/26/13 0758 Last data filed at 10/26/13 0616  Gross per 24 hour  Intake   1080 ml  Output    551 ml  Net    529 ml   Filed Weights   10/24/13 0500 10/25/13 0602 10/26/13 0508  Weight: 80.377 kg (177 lb 3.2 oz) 79.334 kg (174 lb 14.4 oz) 80.151 kg (176 lb 11.2 oz)     Exam:  General exam: chronically ill-looking male lying comfortably on bed.  Respiratory system: improved air entry but diminished at bases.  Cardiovascular system: S1 & S2 heard, RRR. No JVD, murmurs, gallops, clicks or pedal edema. Dressing noted over right lower chest. Gastrointestinal system: Abdomen is mildly distended, soft and nontender. Brusiing noted over the right side along where the shunt is located. Central nervous system: Alert and oriented. No focal neurological deficits.  Data Reviewed: Basic Metabolic Panel:  Recent Labs Lab 10/22/13 0920 10/23/13 0519 10/24/13 0824 10/25/13 0502 10/26/13 0500  NA 122* 121* 121* 126* 124*  K 4.5 4.3 4.2 3.2* 3.0*  CL 85* 85* 84* 87* 86*  CO2 29 27 30 31 31   GLUCOSE 73 94 111* 77 122*  BUN 43* 51* 43* 46* 55*  CREATININE 0.99 1.14 1.01 1.22 1.50*  CALCIUM 8.3* 8.1* 7.4* 7.8* 7.3*  PHOS  --  4.9* 2.7  --   --    Liver Function Tests:  Recent Labs Lab 10/20/13 0415 10/23/13 0519 10/24/13 0824  AST 62* 58*  --   ALT 41 39  --   ALKPHOS 95 110  --   BILITOT 3.4* 2.3*  --   PROT 6.9 6.5  --   ALBUMIN 1.8* 1.7*  1.6* 1.8*   CBC:  Recent Labs Lab 10/22/13 0332 10/23/13 0519  10/24/13 0046 10/24/13 0337  10/25/13 0502 10/25/13 1153 10/25/13 1658  10/25/13 2315 10/26/13 0500  WBC 9.4 9.8  --  7.3 7.7  --  6.7  --   --   --   --   NEUTROABS  --  6.6  --   --   --   --   --   --   --   --   --   HGB 10.7* 10.9*  --  9.2* 9.1*  --  10.0*  --   --   --   --   HCT 30.1* 29.9*  --  25.8* 25.1*  --  28.4*  --   --   --   --   MCV 89.3 87.2  --  90.5 86.6  --  87.9  --   --   --   --   PLT 111* 132*  < > 83*  82* 92*  88*  < > 86*  81* 86* 72* 89* 77*  < > = values in this interval not displayed. BNP (last 3 results)  Recent Labs  09/02/13 0520 09/09/13 0430 10/07/13 1029  PROBNP 348.5* 383.9* 241.7*   CBG:  Recent Labs Lab 10/19/13 2109 10/20/13 0608 10/20/13 1124 10/20/13 1633 10/20/13 2136  GLUCAP 108* 75 86 126* 145*     Scheduled Meds: . sodium chloride   Intravenous Once  . sodium  chloride   Intravenous Once  . amoxicillin  500 mg Oral 3 times per day  . clonazePAM  0.5 mg Oral QHS  . fentaNYL  25 mcg Transdermal Q72H  . folic acid  1 mg Oral Daily  . lactulose  30 g Oral Daily  . pantoprazole  40 mg Oral Q0600  . saccharomyces boulardii  250 mg Oral BID  . sodium chloride  3 mL Intravenous Q12H  . tamsulosin  0.4 mg Oral Daily  . thiamine  100 mg Oral Daily   Continuous Infusions:   Principal Problem:   Recurrent right pleural effusion Active Problems:   Hyponatremia   possible encephalopathy, hepatic   Ascites due to alcoholic cirrhosis   BPH (benign prostatic hyperplasia)   Liver cirrhosis   Alcohol abuse, in remission   Hepatitis C   Acute respiratory failure with hypoxia   Weakness generalized   Palliative care encounter   Abdominal pain   Esophageal ulcer   Time spent: 20 mins   Osvaldo ShipperKRISHNAN,Jameka Ivie, MD  [facility] Pager [phone]  If 7PM-7AM, please contact night-coverage [email or web address] Password [email or web address] 10/26/2013, 7:58 AM    LOS: 15 days

## 2013-10-26 NOTE — Progress Notes (Signed)
Patient ID: Xavier Valentine, male   DOB: [birth year], 53 y.o.   MRN: [id number]   Referring Physician(s): TRH  Subjective:  Pt without new c/o; some soreness still noted in right lower abdomen  Allergies: Bee venom  Medications: Prior to Admission medications   Medication Sig Start Date End Date Taking? Authorizing Provider  albuterol (PROVENTIL HFA;VENTOLIN HFA) 108 (90 BASE) MCG/ACT inhaler Inhale 2 puffs into the lungs daily as needed for wheezing or shortness of breath.   Yes Historical Provider, MD  clonazePAM (KLONOPIN) 0.5 MG tablet Take 1 tablet (0.5 mg total) by mouth daily as needed for anxiety. 10/09/13  Yes Leroy Sea, MD  docusate sodium (COLACE) 100 MG capsule Take 1 capsule (100 mg total) by mouth 2 (two) times daily as needed for mild constipation. Hold if you have diarrhea. 10/01/13  Yes Tora Kindred York, PA-C  folic acid (FOLVITE) 1 MG tablet Take 1 tablet (1 mg total) by mouth daily. 09/09/13  Yes Clydia Llano, MD  furosemide (LASIX) 40 MG tablet Take 2 tablets (80 mg total) by mouth 2 (two) times daily. 10/10/13  Yes Leroy Sea, MD  lactulose (CHRONULAC) 10 GM/15ML solution Take 45 mLs (30 g total) by mouth daily. 10/01/13  Yes Marianne L York, PA-C  Multiple Vitamin (MULTIVITAMIN WITH MINERALS) TABS tablet Take 1 tablet by mouth daily. 09/09/13  Yes Clydia Llano, MD  oxyCODONE (ROXICODONE) 5 MG immediate release tablet Take 1 tablet (5 mg total) by mouth every 4 (four) hours as needed for severe pain. 10/09/13  Yes Leroy Sea, MD  spironolactone (ALDACTONE) 100 MG tablet Take 1 tablet (100 mg total) by mouth 2 (two) times daily. 10/10/13  Yes Leroy Sea, MD  tamsulosin (FLOMAX) 0.4 MG CAPS capsule Take 1 capsule (0.4 mg total) by mouth daily. 09/23/13  Yes Tora Kindred York, PA-C  thiamine 100 MG tablet Take 1 tablet (100 mg total) by mouth daily. 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 Not a candidate for TIPS.  EXAM: ULTRASOUND GUIDED PERITONEAL ACCESS FOR PARACENTESIS.  ULTRASOUND-GUIDED PLEURAL ACCESS FOR THORACENTESIS.  ULTRASOUND GUIDED RIGHT INTERNAL JUGULAR ACCESS FOR SHUNT PLACEMENT.  DENVER SHUNT (PERITONEAL TO VENOUS) TUNNELED CATHETER -- 40981 -- WITH COMPLICATING MEDICAL CONDITIONS.  CLINICAL DATA:  53 year old male with a history of hepatitis-C and alcoholic cirrhosis. He has been admitted on October 12 with decompensated recurrent ascites. The patient has a MELD score of at least 18, with Child Pugh category C cirrhosis/liver disease, with mild encephalopathy, and is not a candidate for TIPS.  He has been failing medical therapy for recurrent ascites and a right-sided hepatic hydrothorax. He has become hyponatremic with multiple medical problems.  Previous right-sided thoracentesis has yielded 1.5 L October 13, 2013 and 2.0 L October 15, 2013.  Complicating medical history includes cirrhosis, ascites, thrombocytopenia, coagulopathy, hepatitis-C, prior inguinal hernia and chronic alcoholism.  MEDICATIONS: Fentanyl 50 mcg  IV; Versed 1.5 mg IV  ANESTHESIA/SEDATION: Sedation Time  116 minutes  Fluoro time: 36 seconds  CONTRAST:  None  COMPLICATIONS: None immediate.  PROCEDURE: Informed consent was obtained from the patient following an explanation of the procedure, risks, benefits and alternatives. Risks discussed included bleeding, infection, sepsis, shunt failure, requirement for shunt replacement, additional procedures,  disseminated intravascular coagulopathy/coagulation (DIC), cardiopulmonary collapse, death. The patient understands, agrees and consents for the procedure. All questions were addressed. A time out was performed prior to the initiation of the procedure. The patient was positioned supine on the fluoroscopy table for access to both the right pleural space and the peritoneal space, as well as the right internal jugular vein. The operative site was prepped and draped in the usual sterile fashion.  Ultrasound survey of the abdomen was performed with images stored and sent to PACs.  A suitable approach into the peritoneum was determined for placement of a Yueh needle. The needle was advanced into the peritoneum and ascitic fluid was returned. A 0.035 wire was advanced and view needle was removed. Serial dilation with 12 French dilator was performed before placing a 16 French peel-away sheath. The peel-away sheath was then attached to vacuum aspiration for paracentesis.  Ultrasound guided as placement of a second Yueh needle was then performed into the right pleural space. Once the needle was removed the catheter was attached to vacuum aspiration for large volume thoracentesis.  Ultrasound survey of the right internal jugular vein was then performed with images stored and sent to PACs. A micropuncture needle was then used access the right internal jugular vein after infiltration of the skin and subcutaneous tissues with 1% lidocaine without epinephrine. Once we have established access to the right internal jugular vein, serial dilation of the tract with 12 [place] dilator was performed, with subsequent placement of 16 French peel-away sheath.  A site on the right lower thorax at the costochondral junction was selected for placement of the shunt pump. A 3 cm incision was performed, with blunt dissection used to create pocket for the pump.  A 2 L bag of body-temperature saline was then infused into the peritoneal space after  complete evacuation of the peritoneal ascites. This fluid was then partially evacuated with vacuum suction, with approximately 500 cc of saline remaining.  The peritoneal limb of the shunt catheter was then back tunneled from the pocket towards the puncture site on the right upper quadrant. The catheter was pulled through the tract and the tunnel were removed. The catheter was then placed through the peel-away sheath and the peel-away sheath was removed.  The shunt pump was then positioned in the pocket, with 2 Prolene retention sutures placed. The venous limb was tunneled along the chest wall after infiltration of the subcutaneous tissues. The catheter was pulled through the neck puncture site. The required length of the venous limb was determined by placing a J wire through the peel-away sheath and measuring to the right atrium.  The venous limb was amputated at the proper length, and then the entire shunt catheter was primed with the remaining ascitic fluid/saline in the abdomen.  The venous limb was placed through the peel-away at the right IJ and into the right atrium.  A final image was stored.  The shunt pump pocket was closed with a deep layer of Vicryl resorbable suture, a writing subcuticular layer with Monocryl, and interrupted Prolene sutures to assure that the wound does not dehisce with the pressure of pumping the catheter. The peritoneal  insertion site was closed with a Vicryl deep suture an a Monocryl suture as well as Derma bond, and the neck incision site was sealed with Dermabond.  The patient tolerated the procedure well and remained hemodynamically stable throughout.  No complications were encountered and no significant blood loss was encountered.  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  PLAN: The patient will need to pump the shunt pump as instructed 20 times in the morning and 20 times  in the evening. It is best in the short term if this is done at least every 3-4 hours to assure that there is no debris within the shunt tubing and shunt pump.  A DIC panel will be ordered every 4 hours starting at 9 p.m. on the day of the procedure to assure that the patient does not become coagulopathic. The highest risk is within the first 24 hours.  The patient will receive 7 days of antibiotics. The first 24 hours may be IV.  Pain control.  Follow-up appointment in 7-14 days after discharge with Dr. Loreta Ave in [facility]. Patient may require occasional repeat thoracentesis over the next several weeks. This need should decrease with successful use of the shunt.  Signed,  Yvone Neu. Loreta Ave, DO  [facility]  [facility]   Electronically Signed   By: Gilmer Mor D.O.   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 Not a candidate for TIPS.  EXAM: ULTRASOUND GUIDED PERITONEAL ACCESS FOR PARACENTESIS.  ULTRASOUND-GUIDED PLEURAL ACCESS FOR THORACENTESIS.  ULTRASOUND GUIDED RIGHT INTERNAL JUGULAR ACCESS FOR SHUNT PLACEMENT.  DENVER SHUNT (PERITONEAL TO VENOUS) TUNNELED CATHETER -- 16109 -- WITH COMPLICATING MEDICAL CONDITIONS.  CLINICAL DATA:  53 year old male with a history of hepatitis-C and alcoholic cirrhosis. He has been admitted on October 12 with decompensated recurrent ascites. The patient has a MELD score of at least 18, with Child Pugh category C cirrhosis/liver disease, with mild encephalopathy, and is not a candidate for TIPS.  He has been failing medical therapy for recurrent ascites and a right-sided hepatic hydrothorax. He has become hyponatremic with multiple medical problems.  Previous right-sided thoracentesis has yielded 1.5 L October 13, 2013 and 2.0 L October 15, 2013.  Complicating medical history includes cirrhosis,  ascites, thrombocytopenia, coagulopathy, hepatitis-C, prior inguinal hernia and chronic alcoholism.  MEDICATIONS: Fentanyl 50 mcg IV; Versed 1.5 mg IV  ANESTHESIA/SEDATION: Sedation Time  116 minutes  Fluoro time: 36 seconds  CONTRAST:  None  COMPLICATIONS: None immediate.  PROCEDURE: Informed consent was obtained from the patient following an explanation of the procedure, risks, benefits and alternatives. Risks discussed included bleeding, infection, sepsis, shunt failure, requirement for shunt replacement, additional procedures, disseminated intravascular coagulopathy/coagulation (DIC), cardiopulmonary collapse, death. The patient understands, agrees and consents for the procedure. All questions were addressed. A time out was performed prior to the initiation of the procedure. The patient was positioned supine on the fluoroscopy table for access to both the right pleural space and the peritoneal space, as well as the right internal jugular vein. The operative site was prepped and draped in the usual sterile fashion.  Ultrasound survey of the abdomen was performed with images stored and sent to PACs.  A suitable approach into the peritoneum was determined for placement of a Yueh needle. The needle was advanced into the peritoneum  and ascitic fluid was returned. A 0.035 wire was advanced and view needle was removed. Serial dilation with 12 French dilator was performed before placing a 16 French peel-away sheath. The peel-away sheath was then attached to vacuum aspiration for paracentesis.  Ultrasound guided as placement of a second Yueh needle was then performed into the right pleural space. Once the needle was removed the catheter was attached to vacuum aspiration for large volume thoracentesis.  Ultrasound survey of the right internal jugular vein was then performed with images stored and sent to PACs. A micropuncture needle was then used access the right internal jugular vein after infiltration of the skin and  subcutaneous tissues with 1% lidocaine without epinephrine. Once we have established access to the right internal jugular vein, serial dilation of the tract with 12 [place] dilator was performed, with subsequent placement of 16 French peel-away sheath.  A site on the right lower thorax at the costochondral junction was selected for placement of the shunt pump. A 3 cm incision was performed, with blunt dissection used to create pocket for the pump.  A 2 L bag of body-temperature saline was then infused into the peritoneal space after complete evacuation of the peritoneal ascites. This fluid was then partially evacuated with vacuum suction, with approximately 500 cc of saline remaining.  The peritoneal limb of the shunt catheter was then back tunneled from the pocket towards the puncture site on the right upper quadrant. The catheter was pulled through the tract and the tunnel were removed. The catheter was then placed through the peel-away sheath and the peel-away sheath was removed.  The shunt pump was then positioned in the pocket, with 2 Prolene retention sutures placed. The venous limb was tunneled along the chest wall after infiltration of the subcutaneous tissues. The catheter was pulled through the neck puncture site. The required length of the venous limb was determined by placing a J wire through the peel-away sheath and measuring to the right atrium.  The venous limb was amputated at the proper length, and then the entire shunt catheter was primed with the remaining ascitic fluid/saline in the abdomen.  The venous limb was placed through the peel-away at the right IJ and into the right atrium.  A final image was stored.  The shunt pump pocket was closed with a deep layer of Vicryl resorbable suture, a writing subcuticular layer with Monocryl, and interrupted Prolene sutures to assure that the wound does not dehisce with the pressure of pumping the catheter. The peritoneal insertion site was closed with a  Vicryl deep suture an a Monocryl suture as well as Derma bond, and the neck incision site was sealed with Dermabond.  The patient tolerated the procedure well and remained hemodynamically stable throughout.  No complications were encountered and no significant blood loss was encountered.  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  PLAN: The patient will need to pump the shunt pump as instructed 20 times in the morning and 20 times in the evening. It is best in the short term if this is done at least every 3-4 hours to assure that there is no debris within the shunt tubing and shunt pump.  A DIC panel will be ordered every 4 hours starting at 9 p.m. on the day of the procedure to assure that the patient does not become coagulopathic. The highest risk is within the first 24 hours.  The patient will  receive 7 days of antibiotics. The first 24 hours may be IV.  Pain control.  Follow-up appointment in 7-14 days after discharge with Dr. Loreta Ave in [facility]. Patient may require occasional repeat thoracentesis over the next several weeks. This need should decrease with successful use of the shunt.  Signed,  Yvone Neu. Loreta Ave, DO  [facility]  [facility]   Electronically Signed   By: Gilmer Mor D.O.   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 Not a candidate for TIPS.  EXAM: ULTRASOUND GUIDED PERITONEAL ACCESS FOR PARACENTESIS.  ULTRASOUND-GUIDED PLEURAL ACCESS FOR THORACENTESIS.  ULTRASOUND GUIDED RIGHT INTERNAL JUGULAR ACCESS FOR SHUNT PLACEMENT.  DENVER SHUNT (PERITONEAL TO VENOUS) TUNNELED CATHETER -- 96045 -- WITH COMPLICATING MEDICAL CONDITIONS.  CLINICAL DATA:  53 year old male with a history of hepatitis-C and alcoholic cirrhosis. He  has been admitted on October 12 with decompensated recurrent ascites. The patient has a MELD score of at least 18, with Child Pugh category C cirrhosis/liver disease, with mild encephalopathy, and is not a candidate for TIPS.  He has been failing medical therapy for recurrent ascites and a right-sided hepatic hydrothorax. He has become hyponatremic with multiple medical problems.  Previous right-sided thoracentesis has yielded 1.5 L October 13, 2013 and 2.0 L October 15, 2013.  Complicating medical history includes cirrhosis, ascites, thrombocytopenia, coagulopathy, hepatitis-C, prior inguinal hernia and chronic alcoholism.  MEDICATIONS: Fentanyl 50 mcg IV; Versed 1.5 mg IV  ANESTHESIA/SEDATION: Sedation Time  116 minutes  Fluoro time: 36 seconds  CONTRAST:  None  COMPLICATIONS: None immediate.  PROCEDURE: Informed consent was obtained from the patient following an explanation of the procedure, risks, benefits and alternatives. Risks discussed included bleeding, infection, sepsis, shunt failure, requirement for shunt replacement, additional procedures, disseminated intravascular coagulopathy/coagulation (DIC), cardiopulmonary collapse, death. The patient understands, agrees and consents for the procedure. All questions were addressed. A time out was performed prior to the initiation of the procedure. The patient was positioned supine on the fluoroscopy table for access to both the right pleural space and the peritoneal space, as well as the right internal jugular vein. The operative site was prepped and draped in the usual sterile fashion.  Ultrasound survey of the abdomen was performed with images stored and sent to PACs.  A suitable approach into the peritoneum was determined for placement of a Yueh needle. The needle was advanced into the peritoneum and ascitic fluid was returned. A 0.035 wire was advanced and view needle was removed. Serial dilation with 12 French dilator was performed before placing a 16 French  peel-away sheath. The peel-away sheath was then attached to vacuum aspiration for paracentesis.  Ultrasound guided as placement of a second Yueh needle was then performed into the right pleural space. Once the needle was removed the catheter was attached to vacuum aspiration for large volume thoracentesis.  Ultrasound survey of the right internal jugular vein was then performed with images stored and sent to PACs. A micropuncture needle was then used access the right internal jugular vein after infiltration of the skin and subcutaneous tissues with 1% lidocaine without epinephrine. Once we have established access to the right internal jugular vein, serial dilation of the tract with 12 [place] dilator was performed, with subsequent placement of 16 French peel-away sheath.  A site on the right lower thorax at the costochondral junction was selected for placement of the shunt pump.  A 3 cm incision was performed, with blunt dissection used to create pocket for the pump.  A 2 L bag of body-temperature saline was then infused into the peritoneal space after complete evacuation of the peritoneal ascites. This fluid was then partially evacuated with vacuum suction, with approximately 500 cc of saline remaining.  The peritoneal limb of the shunt catheter was then back tunneled from the pocket towards the puncture site on the right upper quadrant. The catheter was pulled through the tract and the tunnel were removed. The catheter was then placed through the peel-away sheath and the peel-away sheath was removed.  The shunt pump was then positioned in the pocket, with 2 Prolene retention sutures placed. The venous limb was tunneled along the chest wall after infiltration of the subcutaneous tissues. The catheter was pulled through the neck puncture site. The required length of the venous limb was determined by placing a J wire through the peel-away sheath and measuring to the right atrium.  The venous limb was amputated at the  proper length, and then the entire shunt catheter was primed with the remaining ascitic fluid/saline in the abdomen.  The venous limb was placed through the peel-away at the right IJ and into the right atrium.  A final image was stored.  The shunt pump pocket was closed with a deep layer of Vicryl resorbable suture, a writing subcuticular layer with Monocryl, and interrupted Prolene sutures to assure that the wound does not dehisce with the pressure of pumping the catheter. The peritoneal insertion site was closed with a Vicryl deep suture an a Monocryl suture as well as Derma bond, and the neck incision site was sealed with Dermabond.  The patient tolerated the procedure well and remained hemodynamically stable throughout.  No complications were encountered and no significant blood loss was encountered.  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  PLAN: The patient will need to pump the shunt pump as instructed 20 times in the morning and 20 times in the evening. It is best in the short term if this is done at least every 3-4 hours to assure that there is no debris within the shunt tubing and shunt pump.  A DIC panel will be ordered every 4 hours starting at 9 p.m. on the day of the procedure to assure that the patient does not become coagulopathic. The highest risk is within the first 24 hours.  The patient will receive 7 days of antibiotics. The first 24 hours may be IV.  Pain control.  Follow-up appointment in 7-14 days after discharge with Dr. Loreta Ave in [facility]. Patient may require occasional repeat thoracentesis over the next several weeks. This need should decrease with successful use of the shunt.  Signed,  Yvone Neu. Loreta Ave, DO  [facility]  [facility]   Electronically Signed   By: Gilmer Mor D.O.   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 Not a candidate for TIPS.  EXAM: ULTRASOUND GUIDED PERITONEAL ACCESS FOR PARACENTESIS.  ULTRASOUND-GUIDED PLEURAL ACCESS FOR THORACENTESIS.  ULTRASOUND GUIDED RIGHT INTERNAL JUGULAR ACCESS FOR SHUNT PLACEMENT.  DENVER SHUNT (PERITONEAL TO VENOUS) TUNNELED CATHETER -- 16109 -- WITH COMPLICATING MEDICAL CONDITIONS.  CLINICAL DATA:  53 year old male with a history of hepatitis-C and alcoholic cirrhosis. He has been admitted on  October 12 with decompensated recurrent ascites. The patient has a MELD score of at least 18, with Child Pugh category C cirrhosis/liver disease, with mild encephalopathy, and is not a candidate for TIPS.  He has been failing medical therapy for recurrent ascites and a right-sided hepatic hydrothorax. He has become hyponatremic with multiple medical problems.  Previous right-sided thoracentesis has yielded 1.5 L October 13, 2013 and 2.0 L October 15, 2013.  Complicating medical history includes cirrhosis, ascites, thrombocytopenia, coagulopathy, hepatitis-C, prior inguinal hernia and chronic alcoholism.  MEDICATIONS: Fentanyl 50 mcg IV; Versed 1.5 mg IV  ANESTHESIA/SEDATION: Sedation Time  116 minutes  Fluoro time: 36 seconds  CONTRAST:  None  COMPLICATIONS: None immediate.  PROCEDURE: Informed consent was obtained from the patient following an explanation of the procedure, risks, benefits and alternatives. Risks discussed included bleeding, infection, sepsis, shunt failure, requirement for shunt replacement, additional procedures, disseminated intravascular coagulopathy/coagulation (DIC), cardiopulmonary collapse, death. The patient understands, agrees and consents for the procedure. All questions were addressed. A time out was performed prior to the initiation of the procedure. The patient was positioned supine on the fluoroscopy table for access to  both the right pleural space and the peritoneal space, as well as the right internal jugular vein. The operative site was prepped and draped in the usual sterile fashion.  Ultrasound survey of the abdomen was performed with images stored and sent to PACs.  A suitable approach into the peritoneum was determined for placement of a Yueh needle. The needle was advanced into the peritoneum and ascitic fluid was returned. A 0.035 wire was advanced and view needle was removed. Serial dilation with 12 French dilator was performed before placing a 16 French peel-away sheath. The peel-away sheath was then attached to vacuum aspiration for paracentesis.  Ultrasound guided as placement of a second Yueh needle was then performed into the right pleural space. Once the needle was removed the catheter was attached to vacuum aspiration for large volume thoracentesis.  Ultrasound survey of the right internal jugular vein was then performed with images stored and sent to PACs. A micropuncture needle was then used access the right internal jugular vein after infiltration of the skin and subcutaneous tissues with 1% lidocaine without epinephrine. Once we have established access to the right internal jugular vein, serial dilation of the tract with 12 [place]French dilator was performed, with subsequent placement of 16 French peel-away sheath.  A site on the right lower thorax at the costochondral junction was selected for placement of the shunt pump. A 3 cm incision was performed, with blunt dissection used to create pocket for the pump.  A 2 L bag of body-temperature saline was then infused into the peritoneal space after complete evacuation of the peritoneal ascites. This fluid was then partially evacuated with vacuum suction, with approximately 500 cc of saline remaining.  The peritoneal limb of the shunt catheter was then back tunneled from the pocket towards the puncture site on the right upper quadrant. The catheter was pulled through the  tract and the tunnel were removed. The catheter was then placed through the peel-away sheath and the peel-away sheath was removed.  The shunt pump was then positioned in the pocket, with 2 Prolene retention sutures placed. The venous limb was tunneled along the chest wall after infiltration of the subcutaneous tissues. The catheter was pulled through the neck puncture site. The required length of the venous limb was determined by placing a J wire through the peel-away sheath and measuring to the  right atrium.  The venous limb was amputated at the proper length, and then the entire shunt catheter was primed with the remaining ascitic fluid/saline in the abdomen.  The venous limb was placed through the peel-away at the right IJ and into the right atrium.  A final image was stored.  The shunt pump pocket was closed with a deep layer of Vicryl resorbable suture, a writing subcuticular layer with Monocryl, and interrupted Prolene sutures to assure that the wound does not dehisce with the pressure of pumping the catheter. The peritoneal insertion site was closed with a Vicryl deep suture an a Monocryl suture as well as Derma bond, and the neck incision site was sealed with Dermabond.  The patient tolerated the procedure well and remained hemodynamically stable throughout.  No complications were encountered and no significant blood loss was encountered.  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  PLAN: The patient will need to pump the shunt pump as instructed 20 times in the morning and 20 times in the evening. It is best in the short term if this is done at least every 3-4 hours to assure that there is no debris within the shunt tubing and shunt pump.  A DIC panel will be ordered every 4 hours starting at 9 p.m. on the day of the procedure to assure that the patient does not become coagulopathic. The highest risk is  within the first 24 hours.  The patient will receive 7 days of antibiotics. The first 24 hours may be IV.  Pain control.  Follow-up appointment in 7-14 days after discharge with Dr. Loreta Ave [facility]. Patient may require occasional repeat thoracentesis over the next several weeks. This need should decrease with successful use of the shunt.  Signed,  Yvone Neu. Loreta Ave, DO  [facility]  [facility]   Electronically Signed   By: Gilmer Mor D.O.   On: 10/23/2013 19:42    Labs:  CBC:  Recent Labs  10/23/13 0519  10/24/13 0046 10/24/13 0337  10/25/13 0502 10/25/13 1153 10/25/13 1658 10/25/13 2315 10/26/13 0500  WBC 9.8  --  7.3 7.7  --  6.7  --   --   --   --   HGB 10.9*  --  9.2* 9.1*  --  10.0*  --   --   --   --   HCT 29.9*  --  25.8* 25.1*  --  28.4*  --   --   --   --   PLT 132*  < > 83*  82* 92*  88*  < > 86*  81* 86* 72* 89* 77*  < > = values in this interval not displayed.  COAGS:  Recent Labs  10/25/13 1153 10/25/13 1658 10/25/13 2315 10/26/13 0500  INR 3.12* 3.46* 3.13* 3.32*  APTT 53* 56* 53* 54*    BMP:  Recent Labs  10/23/13 0519 10/24/13 0824 10/25/13 0502 10/26/13 0500  NA 121* 121* 126* 124*  K 4.3 4.2 3.2* 3.0*  CL 85* 84* 87* 86*  CO2 27 30 31 31   GLUCOSE 94 111* 77 122*  BUN 51* 43* 46* 55*  CALCIUM 8.1* 7.4* 7.8* 7.3*  CREATININE 1.14 1.01 1.22 1.50*  GFRNONAA 71* 82* 65* 51*  GFRAA 82* >90 75* 59*    LIVER FUNCTION TESTS:  Recent Labs  10/18/13 0441 10/19/13 0358 10/20/13 0415 10/23/13 9604  10/24/13 0824  BILITOT 3.1* 2.8* 3.4* 2.3*  --   AST 55* 58* 62* 58*  --   ALT 36 38 41 39  --   ALKPHOS 97 94 95 110  --   PROT 6.9 6.8 6.9 6.5  --   ALBUMIN 1.8* 1.7* 1.8* 1.7*  1.6* 1.8*    Assessment and Plan:  S/p placement of Denver(peritoneo-venous ) shunt 10/22 for refractory ascites/hepatic right hydrothorax; DIC panels/plts currently stable;  creat up sl to 1.5 (1.2), K 3.0- renal following, holding diuretics briefly, replacing K ; cont antbx with either amoxil or preferably clindamycin (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 Loreta Ave.        I spent a total of 15 minutes face to face in clinical consultation/evaluation, greater than 50% of which was counseling/coordinating care for Denver/peritoneo-venous shunt  Signed: Terryann Verbeek,D KEVIN 10/26/2013, 10:54 AM

## 2013-10-27 LAB — DIC (DISSEMINATED INTRAVASCULAR COAGULATION)PANEL
D-Dimer, Quant: >20 ug{FEU}/mL — ABNORMAL HIGH (ref 0.00–0.48)
Fibrinogen: <60 mg/dL — CL (ref 204–475)
Fibrinogen: <60 mg/dL — CL (ref 204–475)
INR: 3.13 — ABNORMAL HIGH (ref 0.00–1.49)
INR: 3.32 — ABNORMAL HIGH (ref 0.00–1.49)
Platelets: 89 K/uL — ABNORMAL LOW (ref 150–400)
Prothrombin Time: 32.4 s — ABNORMAL HIGH (ref 11.6–15.2)
Prothrombin Time: 33.9 seconds — ABNORMAL HIGH (ref 11.6–15.2)
Smear Review: NONE SEEN
Smear Review: NONE SEEN
aPTT: 53 s — ABNORMAL HIGH (ref 24–37)

## 2013-10-27 LAB — BASIC METABOLIC PANEL
Anion gap: 10 (ref 5–15)
BUN: 48 mg/dL — AB (ref 6–23)
CO2: 29 mEq/L (ref 19–32)
Calcium: 7.2 mg/dL — ABNORMAL LOW (ref 8.4–10.5)
Chloride: 89 mEq/L — ABNORMAL LOW (ref 96–112)
Creatinine, Ser: 1.24 mg/dL (ref 0.50–1.35)
GFR calc Af Amer: 74 mL/min — ABNORMAL LOW (ref >90)
GFR, EST NON AFRICAN AMERICAN: 64 mL/min — AB (ref >90)
GLUCOSE: 95 mg/dL (ref 70–99)
POTASSIUM: 3.1 meq/L — AB (ref 3.7–5.3)
Sodium: 128 mEq/L — ABNORMAL LOW (ref 137–147)

## 2013-10-27 LAB — CBC
HEMATOCRIT: 25.1 % — AB (ref 39.0–52.0)
Hemoglobin: 9 g/dL — ABNORMAL LOW (ref 13.0–17.0)
MCH: 31.4 pg (ref 26.0–34.0)
MCHC: 35.9 g/dL (ref 30.0–36.0)
MCV: 87.5 fL (ref 78.0–100.0)
Platelets: 77 10*3/uL — ABNORMAL LOW (ref 150–400)
RBC: 2.87 MIL/uL — AB (ref 4.22–5.81)
RDW: 14.9 % (ref 11.5–15.5)
WBC: 5.8 10*3/uL (ref 4.0–10.5)

## 2013-10-27 LAB — DIC (DISSEMINATED INTRAVASCULAR COAGULATION) PANEL
D-Dimer, Quant: 14.95 ug/mL-FEU — ABNORMAL HIGH (ref 0.00–0.48)
Fibrinogen: <60 mg/dL — CL (ref 204–475)
INR: 3.18 — AB (ref 0.00–1.49)
PLATELETS: 77 10*3/uL — AB (ref 150–400)
PLATELETS: 81 10*3/uL — AB (ref 150–400)
Prothrombin Time: 32.8 seconds — ABNORMAL HIGH (ref 11.6–15.2)
SMEAR REVIEW: NONE SEEN
aPTT: 54 seconds — ABNORMAL HIGH (ref 24–37)
aPTT: 47 seconds — ABNORMAL HIGH (ref 24–37)

## 2013-10-27 MED ORDER — AMOXICILLIN 500 MG PO CAPS: 500.0000 mg | ORAL_CAPSULE | Freq: Three times a day (TID) | ORAL | Status: AC

## 2013-10-27 MED ORDER — FUROSEMIDE 40 MG PO TABS: 40.0000 mg | ORAL_TABLET | Freq: Every day | ORAL | Status: DC

## 2013-10-27 MED ORDER — FENTANYL 25 MCG/HR TD PT72: 25.0000 ug | MEDICATED_PATCH | TRANSDERMAL | Status: DC

## 2013-10-27 MED ORDER — OXYCODONE HCL 5 MG PO TABS: 5.0000 mg | ORAL_TABLET | ORAL | Status: AC | PRN

## 2013-10-27 MED ORDER — OMEPRAZOLE 40 MG PO CPDR: 40.0000 mg | DELAYED_RELEASE_CAPSULE | Freq: Every day | ORAL | Status: DC

## 2013-10-27 MED ORDER — SACCHAROMYCES BOULARDII 250 MG PO CAPS: 250.0000 mg | ORAL_CAPSULE | Freq: Two times a day (BID) | ORAL | Status: DC

## 2013-10-27 MED ORDER — POLYETHYLENE GLYCOL 3350 17 G PO PACK: 17.0000 g | PACK | Freq: Every day | ORAL | Status: DC | PRN

## 2013-10-27 NOTE — Progress Notes (Signed)
Pt O2 sats started out at 92% RA while ambulating and dropped to 87-88% RA.  Asymptomatic, pt said "he felt light headed when his O2 came up".  MD notified, will continue to monitor.

## 2013-10-27 NOTE — Discharge Instructions (Signed)
Regular (q 4-6 hrs) shunt pumping with 15-20 depressions each instance.  Ascites Ascites is a gathering of fluid in the belly (abdomen). This is most often caused by liver disease. It may also be caused by a number of other less common problems. It causes a ballooning out (distension) of the abdomen. CAUSES  Scarring of the liver (cirrhosis) is the most common cause of ascites. Other causes include:  Infection or inflammation in the abdomen.  Cancer in the abdomen.  Heart failure.  Certain forms of kidney failure (nephritic syndrome).  Inflammation of the pancreas.  Clots in the veins of the liver. SYMPTOMS  In the early stages of ascites, you may not have any symptoms. The main symptom of ascites is a sense of abdominal bloating. This is due to the presence of fluid. This may also cause an increase in abdominal or waist size. People with this condition can develop swelling in the legs, and men can develop a swollen scrotum. When there is a lot of fluid, it may be hard to breath. Stretching of the abdomen by fluid can be painful. DIAGNOSIS  Certain features of your medical history, such as a history of liver disease and of an enlarging abdomen, can suggest the presence of ascites. The diagnosis of ascites can be made on physical exam by your caregiver. An abdominal ultrasound examination can confirm that ascites is present, and estimate the amount of fluid. Once ascites is confirmed, it is important to determine its cause. Again, a history of one of the conditions listed in "CAUSES" provides a strong clue. A physical exam is important, and blood and X-ray tests may be needed. During a procedure called paracentesis, a sample of fluid is removed from the abdomen. This can determine certain key features about the fluid, such as whether or not infection or cancer is present. Your caregiver will determine if a paracentesis is necessary. They will describe the procedure to you. PREVENTION  Ascites  is a complication of other conditions. Therefore to prevent ascites, you must seek treatment for any significant health conditions you have. Once ascites is present, careful attention to fluid and salt intake may help prevent it from getting worse. If you have ascites, you should not drink alcohol. PROGNOSIS  The prognosis of ascites depends on the underlying disease. If the disease is reversible, such as with certain infections or with heart failure, then ascites may improve or disappear. When ascites is caused by cirrhosis, then it indicates that the liver disease has worsened, and further evaluation and treatment of the liver disease is needed. If your ascites is caused by cancer, then the success or failure of the cancer treatment will determine whether your ascites will improve or worsen. RISKS AND COMPLICATIONS  Ascites is likely to worsen if it is not properly diagnosed and treated. A large amount of ascites can cause pain and difficulty breathing. The main complication, besides worsening, is infection (called spontaneous bacterial peritonitis). This requires prompt treatment. TREATMENT  The treatment of ascites depends on its cause. When liver disease is your cause, medical management using water pills (diuretics) and decreasing salt intake is often effective. Ascites due to peritoneal inflammation or malignancy (cancer) alone does not respond to salt restriction and diuretics. Hospitalization is sometimes required. If the treatment of ascites cannot be managed with medications, a number of other treatments are available. Your caregivers will help you decide which will work best for you. Some of these are:  Removal of fluid from the abdomen (paracentesis).  Fluid from the abdomen is passed into a vein (peritoneovenous shunting).  Liver transplantation.  Transjugular intrahepatic portosystemic stent shunt. HOME CARE INSTRUCTIONS  It is important to monitor body weight and the intake and  output of fluids. Weigh yourself at the same time every day. Record your weights. Fluid restriction may be necessary. It is also important to know your salt intake. The more salt you take in, the more fluid you will retain. Ninety percent of people with ascites respond to this approach.  Follow any directions for medicines carefully.  Follow up with your caregiver, as directed.  Report any changes in your health, especially any new or worsening symptoms.  If your ascites is from liver disease, avoid alcohol and other substances toxic to the liver. SEEK MEDICAL CARE IF:   Your weight increases more than a few pounds in a few days.  Your abdominal or waist size increases.  You develop swelling in your legs.  You had swelling and it worsens. SEEK IMMEDIATE MEDICAL CARE IF:   You develop a fever.  You develop new abdominal pain.  You develop difficulty breathing.  You develop confusion.  You have bleeding from the mouth, stomach, or rectum. MAKE SURE YOU:   Understand these instructions.  Will watch your condition.  Will get help right away if you are not doing well or get worse. Document Released: 12/19/2004 Document Revised: 03/13/2011 Document Reviewed: 07/20/2006 [facility] Patient Information 2015 [place], [place]. This information is not intended to replace advice given to you by your health care provider. Make sure you discuss any questions you have with your health care provider.  Oxygen Use at Home Oxygen can be prescribed for home use. The prescription will show the flow rate. This is how much oxygen is to be used per minute. This will be listed in liters per minute (LPM or L/M). A liter is a metric measurement of volume. You will use oxygen therapy as directed. It can be used while exercising, sleeping, or at rest. You may need oxygen continuously. Your health care provider may order a blood oxygen test (arterial blood gas or pulse oximetry test) that will show what your  oxygen level is. Your health care provider will use these measurements to learn about your needs and follow your progress. Home oxygen therapy is commonly used on patients with various lung (pulmonary) related conditions. Some of these conditions include:  Asthma.  Lung cancer.  Pneumonia.  Emphysema.  Chronic bronchitis.  Cystic fibrosis.  Other lung diseases.  Pulmonary fibrosis.  Occupational lung disease.  Heart failure.  Chronic obstructive pulmonary disease (COPD). 3 COMMON WAYS OF PROVIDING OXYGEN THERAPY  Gas: The gas form of oxygen is put into variously sized cylinders or tanks. The cylinders or oxygen tanks contain compressed oxygen. The cylinder is equipped with a regulator that controls the flow rate. Because the flow of oxygen out of the cylinder is constant, an oxygen conserving device may be attached to the system to avoid waste. This device releases the gas only when you inhale and cuts it off when you exhale. Oxygen can be provided in a small cylinder that can be carried with you. Large tanks are heavy and are only for stationary use. After use, empty tanks must be exchanged for full tanks.  Liquid: The liquid form of oxygen is put into a container similar to a thermos. When released, the liquid converts to a gas and you breathe it in just like the compressed gas. This storage method takes  up less space than the compressed gas cylinder, and you can transfer the liquid to a small, portable vessel at home. Liquid oxygen is more expensive than the compressed gas, and the vessel vents when not in use. An oxygen conserving device may be built into the vessel to conserve the oxygen. Liquid oxygen is very cold, around 297 below zero.  Oxygen concentrator: This medical device filters oxygen from room air and gives almost 100% oxygen to the patient. Oxygen concentrators are powered by electricity. Benefits of this system are:  It does not need to be resupplied.  It is not  as costly as liquid oxygen.  Extra tubing permits the user to move around easier. There are several types of small, portable oxygen systems available which can help you remain active and mobile. You must have a cylinder of oxygen as a backup in the event of a power failure. Advise your electric power company that you are on oxygen therapy in order to get priority service when there is a power failure. OXYGEN DELIVERY DEVICES There are 3 common ways to deliver oxygen to your body.  Nasal cannula. This is a 2-pronged device inserted in the nostrils that is connected to tubing carrying the oxygen. The tubing can rest on the ears or be attached to the frame of eyeglasses.  Mask. People who need a high flow of oxygen generally use a mask.  Transtracheal catheter. Transtracheal oxygen therapy requires the insertion of a small, flexible tube (catheter) in the windpipe (trachea). This catheter is held in place by a necklace. Since transtracheal oxygen bypasses the mouth, nose, and throat, a humidifier is absolutely required at flow rates of 1 LPM or greater. OXYGEN USE SAFETY TIPS  Never smoke while using oxygen. Oxygen does not burn or explode, but flammable materials will burn faster in the presence of oxygen.  Keep a Government social research officerfire extinguisher close by. Let your fire department know that you have oxygen in your home.  Warn visitors not to smoke near you when you are using oxygen. Put up "no smoking" signs in your home where you most often use the oxygen.  When you go to a restaurant with your portable oxygen source, ask to be seated in the nonsmoking section.  Stay at least 5 feet away from gas stoves, candles, lighted fireplaces, or other heat sources.  Do not use materials that burn easily (flammable) while using your oxygen.  If you use an oxygen cylinder, make sure it is secured to some fixed object or in a stand. If you use liquid oxygen, make sure the vessel is kept upright to keep the oxygen from  pouring out. Liquid oxygen is so cold it can hurt your skin.  If you use an oxygen concentrator, call your electric company so you will be given priority service if your power goes out. Avoid using extension cords, if possible.  Regularly test your smoke detectors at home to make sure they work. If you receive care in your home from a nurse or other health care provider, he or she may also check to make sure your smoke detectors work. GUIDELINES FOR CLEANING YOUR EQUIPMENT  Wash the nasal prongs with a liquid soap. Thoroughly rinse them once or twice a week.  Replace the prongs every 2 to 4 weeks. If you have an infection (cold, pneumonia) change them when you are well.  Your health care provider will give you instructions on how to clean your transtracheal catheter.  The humidifier bottle should be  washed with soap and warm water and rinsed thoroughly between each refill. Air-dry the bottle before filling it with sterile or distilled water. The bottle and its top should be disinfected after they are cleaned.  If you use an oxygen concentrator, unplug the unit. Then wipe down the cabinet with a damp cloth and dry it daily. The air filter should be cleaned at least twice a week.  Follow your home medical equipment and service company's directions for cleaning the compressor filter. HOME CARE INSTRUCTIONS   Do not change the flow of oxygen unless directed by your health care provider.  Do not use alcohol or other sedating drugs unless instructed. They slow your breathing rate.  Do not use materials that burn easily (flammable) while using your oxygen.  Always keep a spare tank of oxygen. Plan ahead for holidays when you may not be able to get a prescription filled.  Use water-based lubricants on your lips or nostrils. Do not use an oil-based product like petroleum jelly.  To prevent your cheeks or the skin behind your ears from becoming irritated, tuck some gauze under the tubing.  If  you have persistent redness under your nose, call your health care provider.  When you no longer need oxygen, your doctor will have the oxygen discontinued. Oxygen is not addicting or habit forming.  Use the oxygen as instructed. Too much oxygen can be harmful and too little will not give you the benefit you need.  Shortness of breath is not always from a lack of oxygen. If your oxygen level is not the cause of your shortness of breath, taking oxygen will not help. SEEK MEDICAL CARE IF:   You have frequent headaches.  You have shortness of breath or a lasting cough.  You have anxiety.  You are confused.  You are drowsy or sleepy all the time.  You develop an illness which aggravates your breathing.  You cannot exercise.  You are restless.  You have blue lips or fingernails.  You have difficult or irregular breathing and it is getting worse.  You have a fever. Document Released: 03/11/2003 Document Revised: 05/05/2013 Document Reviewed: 07/31/2012 [facility] Patient Information 2015 [place], [place]. This information is not intended to replace advice given to you by your health care provider. Make sure you discuss any questions you have with your health care provider.

## 2013-10-27 NOTE — Progress Notes (Signed)
PT Cancellation Note  Patient Details Name: Xavier DuskyRichard L Valentine MRN: [id number] DOB: 13-Nov-1960   Cancelled Treatment:    Reason Eval/Treat Not Completed: Patient declined, no reason specified Patient plans to discharge home this afternoon. Declines physical therapy services at this time. States he has no further questions concerning physical therapy. Will continue to follow until d/c.  [address] [place] [place], [place] [phone]   [place], Rayley Gao S 10/27/2013, 2:20 PM

## 2013-10-27 NOTE — Progress Notes (Signed)
Patient ID: Xavier Valentine, male   DOB: [birth year], 53 y.o.   MRN: [id number]   Pt still with coagulation issues after Denver Shunt placement D Dimer 14.95 Plt 81 INR 3.18 Fibrinogen <60  Without bleeding complaints Doing well Better cognition  Plan for dc to sisters house To follow up with Dr Loreta AveWagner 7-10 days Order in for scheduler to call

## 2013-10-27 NOTE — Progress Notes (Signed)
Pt does not have IV access due to being removed earlier because the pt was supposed to be discharged home.  Dr. Rito EhrlichKrishnan notified and orders received for pt not to have IV access.  Will continue to monitor.

## 2013-10-27 NOTE — Discharge Summary (Addendum)
[facility]  Physician Discharge Summary   Patient ID: Xavier Valentine MRN: [id number] DOB/AGE: [birth year] 53 y.o.  Admit date: 10/11/2013 Discharge date: 10/27/2013  PCP: Doris CheadleADVANI, DEEPAK, MD  DISCHARGE DIAGNOSES:  Principal Problem:   Recurrent right pleural effusion Active Problems:   Hyponatremia   possible encephalopathy, hepatic   Ascites due to alcoholic cirrhosis   BPH (benign prostatic hyperplasia)   Liver cirrhosis   Alcohol abuse, in remission   Hepatitis C   Acute respiratory failure with hypoxia   Weakness generalized   Palliative care encounter   Abdominal pain   Esophageal ulcer   RECOMMENDATIONS FOR OUTPATIENT FOLLOW UP: 1. Close follow up with IR, GI and PCP 2. Will need Bmet at PCP follow up for hyponatremia and to check renal function. 3. Home health to be arranged along with home oxygen  DISCHARGE CONDITION: fair  Diet recommendation: Low Sodium  Filed Weights   10/25/13 0602 10/26/13 0508 10/27/13 0500  Weight: 79.334 kg (174 lb 14.4 oz) 80.151 kg (176 lb 11.2 oz) 80.922 kg (178 lb 6.4 oz)    INITIAL HISTORY: [age] year old male with history of hep C/EtOH cirrhosis, coagulopathy, thrombocytopenia, right-sided transudative pleural effusions s/p thoracentesis x1 on 09/27/13, discharged from hospital on 10/10/13, are admitted on 10/11/13 secondary to worsening left-sided chest wall pain and noted to have large right-sided pleural effusion. During previous admission, pulmonology had recommended treating his pleural effusion with Lasix and holding of thoracentesis. Pulmonology was consulted again patient undergoing repeated right thoracentesis by IR. Palliative care consulted for goals of care on 10/13. Patient underwent EGD and placement of [place] shunt. He experiencing low grade DIC after the shunt was placed which was anticipated. He remained stable.   Consultants:  Pulmonology  Palliative care team-pending  Gastroenterology  IR   Nephrology  Procedures:  Therapeutic thoracentesis 10/11, 10/12, 10/14   EGD 10/21  ENDOSCOPIC IMPRESSION:  1. Distal esophageal ulcer. Likely peptic. Status post biopsy  2. NO esophageal varices... Mild portal gastropathy   Denver Shunt Placement 10/22    HOSPITAL COURSE:   1. Hep C/ETOH cirrhosis/coagulopathy/thrombocytopenia/Hypoalbuminemia: As per patient, has not had any alcohol for about 3 months. No evidence for hepatic encephalopathy in the hospital. Rifaximin was stopped by GI. Continue Lactulose for now. Ammonia was normal. Patient apparently had followup appointment with local hepatology clinic and GI and has never followed up. GI has been following inpatient and has now signed off. Follow up with Gi as mentioned below. EGD 10/21 for evaluation of varices found no varices. Esophageal ulcer noted. Patient underwent placement of [place] shunt by IR on 10/22. He is improving. 2. DIC: Low grade DIC noted which can occur after placement of [place] shunt. Discussed with Dr. Clelia CroftShadad on 10/23 and appreciate his input. Patient did not require FFP as he did not have any bleeding. Platelets were transfused. His counts have been stable. Antibiotics recommended by IR and so initialed on Amoxicillin for 7 days. Patient to follow with IR for labs and CXR in 7-10 days. 3. 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 Pulmonology was consulted and initially placed him on IV Lasix and oral Aldactone, without significant improvement. Aldactone discontinued secondary to hyperkalemia. Patient underwent multiple thoracentesis but always reaccumulated. Pulmonary advised no chest tube due to difficulty removing (hepatic hydrothorax) and consider antibiotics if declines (fever, leukocytosis). Pleural fluid culture negative to date. Underwent left thoracocentesis on 10/14. Repeat cxr shows no left sided effusion. He remains  stable from respiratory standpoint.  IR will repeat CXR at follow up. His O2 sats drop with exertion. Home O2 will be arranged. 4. Left sided chest pain/ 7 & 8 rib fractures: This was made worse by dyspnea from problem #1. Pain management and monitor. Improved. Was started on fentanyl patch which will be continued for now. 5. 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 Continue fluid restrictions. Nephrology assisting. He was noted to have elevated BUN/creatinine on 10/25 but it has improved today. Discussed with Dr. Darrick Penna and he recommends lasix 40mg  daily at discharge. Repeat Bmet at PCP in 1 week.  6. History of alcohol abuse: He was placed on Ativan protocol. He did not have overt withdrawal. 7. Ascites: recent ultrasound apparently had not enough ascites to tap. Denied abdominal pain. No features suggestive of SBP. Shunt seems to be helping.  8. Failure to thrive: Seen by PMT. Patient wants to pursue all treatment options but agreed to DNR. 9. Hyperkalemia: Resolved. Kayexalate was given and Aldactone was stopped.  10. Distal Esophageal Ulcer: Noted on EGD. On PPI. GI may need repeat EGD in 8 weeks.    PERTINENT LABS: The results of significant diagnostics from this hospitalization (including imaging, microbiology, ancillary and laboratory) are listed below for reference.     Labs: Basic Metabolic Panel:  Recent Labs Lab 10/23/13 0519 10/24/13 0824 10/25/13 0502 10/26/13 0500 10/27/13 0310  NA 121* 121* 126* 124* 128*  K 4.3 4.2 3.2* 3.0* 3.1*  CL 85* 84* 87* 86* 89*  CO2 27 30 31 31 29   GLUCOSE 94 111* 77 122* 95  BUN 51* 43* 46* 55* 48*  CREATININE 1.14 1.01 1.22 1.50* 1.24  CALCIUM 8.1* 7.4* 7.8* 7.3* 7.2*  PHOS 4.9* 2.7  --   --   --    Liver Function Tests:  Recent Labs Lab 10/23/13 0519 10/24/13 0824  AST 58*  --   ALT 39  --   ALKPHOS 110  --   BILITOT 2.3*  --   PROT 6.5  --   ALBUMIN 1.7*   1.6* 1.8*   CBC:  Recent Labs Lab 10/23/13 0519  10/24/13 0046 10/24/13 0337  10/25/13 0502  10/25/13 1658 10/25/13 2315 10/26/13 0500 10/26/13 1147 10/27/13 0310  WBC 9.8  --  7.3 7.7  --  6.7  --   --   --   --   --  5.8  NEUTROABS 6.6  --   --   --   --   --   --   --   --   --   --   --   HGB 10.9*  --  9.2* 9.1*  --  10.0*  --   --   --   --   --  9.0*  HCT 29.9*  --  25.8* 25.1*  --  28.4*  --   --   --   --   --  25.1*  MCV 87.2  --  90.5 86.6  --  87.9  --   --   --   --   --  87.5  PLT 132*  < > 83*  82* 92*  88*  < > 86*  81*  < > 72* 89* 77* 87* 77*  81*  < > = values in this interval not displayed.  BNP: BNP (last 3 results)  Recent Labs  09/02/13 0520 09/09/13 0430 10/07/13 1029  PROBNP 348.5* 383.9* 241.7*   CBG:  Recent  Labs Lab 10/20/13 1124 10/20/13 1633 10/20/13 2136  GLUCAP 86 126* 145*     IMAGING STUDIES Dg Chest 1 View  10/15/2013   CLINICAL DATA:  Right thoracentesis today, 2 L removed  EXAM: CHEST - 1 VIEW  COMPARISON:  10/13/2013  FINDINGS: Large right effusion and collapse of the right lung with complete opacification right hemi thorax. No pneumothorax.  Left lung remains clear.  IMPRESSION: Complete opacification right hemithorax due to fluid and collapse. No pneumothorax post right thoracentesis.   Electronically Signed   By: Marlan Palau M.D.   On: 10/15/2013 11:29   Dg Chest 1 View  10/13/2013   CLINICAL DATA:  Pleural effusion.  Status post RIGHT thoracentesis.  EXAM: CHEST - 1 VIEW  COMPARISON:  10/13/2013 at 11/1932 priors.  FINDINGS: There is considerable, near complete opacity at the RIGHT hemi thorax despite removal of what is reportedly a large amount of fluid. There is no visible pneumothorax. No osseous lesions.  There may be mild mediastinal shift from LEFT to RIGHT implying a component of this opacity relates to volume loss.  IMPRESSION: No visible pneumothorax following RIGHT thoracentesis. See discussion above.    Electronically Signed   By: Davonna Belling M.D.   On: 10/13/2013 15:30   Dg Chest 2 View  10/16/2013   CLINICAL DATA:  Recurrent effusion.  Evaluate for left effusion.  EXAM: CHEST  2 VIEW  COMPARISON:  10/15/2013.  FINDINGS: Mediastinum and hilar structures normal. Heart size normal. Previous identified complete opacification the right hemithorax has partially cleared. A large right pleural effusions present. Underlying infiltrate and/or atelectasis cannot be excluded. No significant left pleural effusion. No acute bony abnormality.  IMPRESSION: 1. Interim partial clearing of right pleural effusion. Large right effusion remains. Underlying right lower lobe atelectasis and/or consolidation may be present. 2. No evidence of left-sided pleural effusion.   Electronically Signed   By: Maisie Fus  Register   On: 10/16/2013 15:07   Dg Chest 2 View  10/12/2013   CLINICAL DATA:  Persistent dyspnea  EXAM: CHEST  2 VIEW  COMPARISON:  October 11, 2013  FINDINGS: There remains complete opacification of the right hemithorax, presumably due to effusion. If fusion appears to displace the right main bronchus inferiorly.  The left lung is clear except for some cyst minimal left base atelectatic change. The heart size is normal. Pulmonary vascularity on the left is normal. There is no demonstrable pneumothorax.  IMPRESSION: Persistent complete opacification of the right hemi thorax, presumably due to effusion. There may well be superimposed consolidation on the right, obscured by the effusion. Left lung is clear except for mild left base atelectatic change.   Electronically Signed   By: Bretta Bang M.D.   On: 10/12/2013 14:59   Dg Ribs Unilateral W/chest Left  10/11/2013   CLINICAL DATA:  Shortness breath. Cough. Ascites. Hepatic insufficiency. Left rib and chest pain.  EXAM: LEFT RIBS AND CHEST - 3+ VIEW  COMPARISON:  10/07/2013  FINDINGS: Nondisplaced acute fractures are seen involving the left posterior lateral seventh  and eighth ribs. There is no evidence of pneumothorax.  Large right pleural effusion is further increase in size now resulting complete opacification of the right hemithorax. Left lung remains grossly clear. Heart size is stable.  IMPRESSION: Nondisplaced fractures of the left posterior seventh and eighth ribs. No evidence of pneumothorax.  Increased size of large right pleural effusion causing complete opacification of right hemithorax.   Electronically Signed   By: Alver Sorrow.D.  On: 10/11/2013 14:27   Ct Chest W Contrast  10/17/2013   CLINICAL DATA:  Recurrent right pleural effusion.  EXAM: CT CHEST WITH CONTRAST  TECHNIQUE: Multidetector CT imaging of the chest was performed during intravenous contrast administration.  CONTRAST:  80mL OMNIPAQUE IOHEXOL 300 MG/ML  SOLN  COMPARISON:  Chest x-ray 10/16/2013 and 10/13/2013 as well as CT abdomen 08/28/2013.  FINDINGS: Examination demonstrates a large right pleural effusion with associated collapse of the right lower lobe and middle lobe. The aerated portion of the right upper lobe is within normal. Left lung demonstrates minimal focal hazy airspace density over the posterior and lower anterior left upper lobe as cannot exclude developing infection. Heart is normal size. There is a small pericardial effusion there is minimal calcified plaque over the thoracic aorta. Remaining mediastinal structures are unremarkable. There is a displaced posterior right eighth rib fracture  Images through the upper abdomen demonstrate continued moderate ascites with nodular liver suggesting cirrhosis. Stable cyst over the dome of the right lobe of the liver. Minimal loss of vertebral body height of a couple thoracic vertebrae likely due predominately to Schmorl's node formation a as cannot exclude in the following compression fractures.  IMPRESSION: Large right pleural effusion with collapse of the right middle and lower lobes. Displaced posterior right eighth rib fracture.   Minimal focal hazy opacification over the anterior as well as posterior left upper lobe as cannot exclude developing infection.  Small pericardial effusion.  Findings suggesting cirrhosis with ascites unchanged. Right lobe liver cyst unchanged.  Loss of height of 2 thoracic vertebral bodies due mostly to Schmorl's node formation although cannot exclude evolving compression fractures.   Electronically Signed   By: Elberta Fortis M.D.   On: 10/17/2013 15:24    Ir Perc Noralee Stain Perit Cath [facility]  10/23/2013   INDICATION: 53 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 Not a candidate for TIPS.  EXAM: ULTRASOUND GUIDED PERITONEAL ACCESS FOR PARACENTESIS.  ULTRASOUND-GUIDED PLEURAL ACCESS FOR THORACENTESIS.  ULTRASOUND GUIDED RIGHT INTERNAL JUGULAR ACCESS FOR SHUNT PLACEMENT.  DENVER SHUNT (PERITONEAL TO VENOUS) TUNNELED CATHETER -- 16109 -- WITH COMPLICATING MEDICAL CONDITIONS.  CLINICAL DATA:  53 year old male with a history of hepatitis-C and alcoholic cirrhosis. He has been admitted on October 12 with decompensated recurrent ascites. The patient has a MELD score of at least 18, with Child Pugh category C cirrhosis/liver disease, with mild encephalopathy, and is not a candidate for TIPS.  He has been failing medical therapy for recurrent ascites and a right-sided hepatic hydrothorax. He has become hyponatremic with multiple medical problems.  Previous right-sided thoracentesis has yielded 1.5 L October 13, 2013 and 2.0 L October 15, 2013.  Complicating medical history includes cirrhosis, ascites, thrombocytopenia, coagulopathy, hepatitis-C, prior inguinal hernia and chronic alcoholism.  MEDICATIONS: Fentanyl 50 mcg IV; Versed 1.5 mg IV  ANESTHESIA/SEDATION: Sedation Time  116 minutes  Fluoro time: 36 seconds  CONTRAST:  None  COMPLICATIONS: None immediate.  PROCEDURE: Informed consent was obtained from the patient following an explanation of the procedure, risks, benefits and alternatives. Risks discussed  included bleeding, infection, sepsis, shunt failure, requirement for shunt replacement, additional procedures, disseminated intravascular coagulopathy/coagulation (DIC), cardiopulmonary collapse, death. The patient understands, agrees and consents for the procedure. All questions were addressed. A time out was performed prior to the initiation of the procedure. The patient was positioned supine on the fluoroscopy table for access to both the right pleural space and the peritoneal space, as well as the right internal jugular vein. The operative  site was prepped and draped in the usual sterile fashion.  Ultrasound survey of the abdomen was performed with images stored and sent to PACs.  A suitable approach into the peritoneum was determined for placement of a Yueh needle. The needle was advanced into the peritoneum and ascitic fluid was returned. A 0.035 wire was advanced and view needle was removed. Serial dilation with 12 French dilator was performed before placing a 16 French peel-away sheath. The peel-away sheath was then attached to vacuum aspiration for paracentesis.  Ultrasound guided as placement of a second Yueh needle was then performed into the right pleural space. Once the needle was removed the catheter was attached to vacuum aspiration for large volume thoracentesis.  Ultrasound survey of the right internal jugular vein was then performed with images stored and sent to PACs. A micropuncture needle was then used access the right internal jugular vein after infiltration of the skin and subcutaneous tissues with 1% lidocaine without epinephrine. Once we have established access to the right internal jugular vein, serial dilation of the tract with 12 [place] dilator was performed, with subsequent placement of 16 French peel-away sheath.  A site on the right lower thorax at the costochondral junction was selected for placement of the shunt pump. A 3 cm incision was performed, with blunt dissection used to  create pocket for the pump.  A 2 L bag of body-temperature saline was then infused into the peritoneal space after complete evacuation of the peritoneal ascites. This fluid was then partially evacuated with vacuum suction, with approximately 500 cc of saline remaining.  The peritoneal limb of the shunt catheter was then back tunneled from the pocket towards the puncture site on the right upper quadrant. The catheter was pulled through the tract and the tunnel were removed. The catheter was then placed through the peel-away sheath and the peel-away sheath was removed.  The shunt pump was then positioned in the pocket, with 2 Prolene retention sutures placed. The venous limb was tunneled along the chest wall after infiltration of the subcutaneous tissues. The catheter was pulled through the neck puncture site. The required length of the venous limb was determined by placing a J wire through the peel-away sheath and measuring to the right atrium.  The venous limb was amputated at the proper length, and then the entire shunt catheter was primed with the remaining ascitic fluid/saline in the abdomen.  The venous limb was placed through the peel-away at the right IJ and into the right atrium.  A final image was stored.  The shunt pump pocket was closed with a deep layer of Vicryl resorbable suture, a writing subcuticular layer with Monocryl, and interrupted Prolene sutures to assure that the wound does not dehisce with the pressure of pumping the catheter. The peritoneal insertion site was closed with a Vicryl deep suture an a Monocryl suture as well as Derma bond, and the neck incision site was sealed with Dermabond.  The patient tolerated the procedure well and remained hemodynamically stable throughout.  No complications were encountered and no significant blood loss was encountered.  IMPRESSION: Status post placement of peritoneal venous shunt ([place] shunt) for treatment of refractory ascites and large right hepatic  hydrothorax.  PLAN: The patient will need to pump the shunt pump as instructed 20 times in the morning and 20 times in the evening. It is best in the short term if this is done at least every 3-4 hours to assure that there is no debris within the  shunt tubing and shunt pump.  A DIC panel will be ordered every 4 hours starting at 9 p.m. on the day of the procedure to assure that the patient does not become coagulopathic. The highest risk is within the first 24 hours.  The patient will receive 7 days of antibiotics. The first 24 hours may be IV.  Pain control.  Follow-up appointment in 7-14 days after discharge with Dr. Loreta Ave in [facility]. Patient may require occasional repeat thoracentesis over the next several weeks. This need should decrease with successful use of the shunt.  Signed,  Yvone Neu. Loreta Ave, DO  [facility]  [facility]   Electronically Signed   By: Gilmer Mor D.O.   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 Not a candidate for TIPS.  EXAM: ULTRASOUND GUIDED PERITONEAL ACCESS FOR PARACENTESIS.  ULTRASOUND-GUIDED PLEURAL ACCESS FOR THORACENTESIS.  ULTRASOUND GUIDED RIGHT INTERNAL JUGULAR ACCESS FOR SHUNT PLACEMENT.  DEN[place] SHUNT (PERITONEAL TO VENOUS) TUNNELED CATHETER -- 11914 -- WITH COMPLICATING MEDICAL CONDITIONS.  CLINICAL DATA:  53 year old male with a history of hepatitis-C and alcoholic cirrhosis. He has been admitted on October 12 with decompensated recurrent ascites. The patient has a MELD score of at least 18, with Child Pugh category C cirrhosis/liver disease, with mild encephalopathy, and is not a candidate for TIPS.  He has been failing medical therapy for recurrent ascites and a right-sided hepatic hydrothorax. He has become hyponatremic with multiple medical problems.  Previous right-sided thoracentesis has  yielded 1.5 L October 13, 2013 and 2.0 L October 15, 2013.  Complicating medical history includes cirrhosis, ascites, thrombocytopenia, coagulopathy, hepatitis-C, prior inguinal hernia and chronic alcoholism.  MEDICATIONS: Fentanyl 50 mcg IV; Versed 1.5 mg IV  ANESTHESIA/SEDATION: Sedation Time  116 minutes  Fluoro time: 36 seconds  CONTRAST:  None  COMPLICATIONS: None immediate.  PROCEDURE: Informed consent was obtained from the patient following an explanation of the procedure, risks, benefits and alternatives. Risks discussed included bleeding, infection, sepsis, shunt failure, requirement for shunt replacement, additional procedures, disseminated intravascular coagulopathy/coagulation (DIC), cardiopulmonary collapse, death. The patient understands, agrees and consents for the procedure. All questions were addressed. A time out was performed prior to the initiation of the procedure. The patient was positioned supine on the fluoroscopy table for access to both the right pleural space and the peritoneal space, as well as the right internal jugular vein. The operative site was prepped and draped in the usual sterile fashion.  Ultrasound survey of the abdomen was performed with images stored and sent to PACs.  A suitable approach into the peritoneum was determined for placement of a Yueh needle. The needle was advanced into the peritoneum and ascitic fluid was returned. A 0.035 wire was advanced and view needle was removed. Serial dilation with 12 French dilator was performed before placing a 16 French peel-away sheath. The peel-away sheath was then attached to vacuum aspiration for paracentesis.  Ultrasound guided as placement of a second Yueh needle was then performed into the right pleural space. Once the needle was removed the catheter was attached to vacuum aspiration for large volume thoracentesis.  Ultrasound survey of the right internal jugular vein was then performed with images stored and sent to PACs. A  micropuncture needle was then used access the right internal jugular vein after infiltration of the skin and subcutaneous tissues with 1% lidocaine without epinephrine. Once we  have established access to the right internal jugular vein, serial dilation of the tract with 12 [place] dilator was performed, with subsequent placement of 16 French peel-away sheath.  A site on the right lower thorax at the costochondral junction was selected for placement of the shunt pump. A 3 cm incision was performed, with blunt dissection used to create pocket for the pump.  A 2 L bag of body-temperature saline was then infused into the peritoneal space after complete evacuation of the peritoneal ascites. This fluid was then partially evacuated with vacuum suction, with approximately 500 cc of saline remaining.  The peritoneal limb of the shunt catheter was then back tunneled from the pocket towards the puncture site on the right upper quadrant. The catheter was pulled through the tract and the tunnel were removed. The catheter was then placed through the peel-away sheath and the peel-away sheath was removed.  The shunt pump was then positioned in the pocket, with 2 Prolene retention sutures placed. The venous limb was tunneled along the chest wall after infiltration of the subcutaneous tissues. The catheter was pulled through the neck puncture site. The required length of the venous limb was determined by placing a J wire through the peel-away sheath and measuring to the right atrium.  The venous limb was amputated at the proper length, and then the entire shunt catheter was primed with the remaining ascitic fluid/saline in the abdomen.  The venous limb was placed through the peel-away at the right IJ and into the right atrium.  A final image was stored.  The shunt pump pocket was closed with a deep layer of Vicryl resorbable suture, a writing subcuticular layer with Monocryl, and interrupted Prolene sutures to assure that the wound  does not dehisce with the pressure of pumping the catheter. The peritoneal insertion site was closed with a Vicryl deep suture an a Monocryl suture as well as Derma bond, and the neck incision site was sealed with Dermabond.  The patient tolerated the procedure well and remained hemodynamically stable throughout.  No complications were encountered and no significant blood loss was encountered.  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  PLAN: The patient will need to pump the shunt pump as instructed 20 times in the morning and 20 times in the evening. It is best in the short term if this is done at least every 3-4 hours to assure that there is no debris within the shunt tubing and shunt pump.  A DIC panel will be ordered every 4 hours starting at 9 p.m. on the day of the procedure to assure that the patient does not become coagulopathic. The highest risk is within the first 24 hours.  The patient will receive 7 days of antibiotics. The first 24 hours may be IV.  Pain control.  Follow-up appointment in 7-14 days after discharge with Dr. Loreta AveWagner in [facility]. Patient may require occasional repeat thoracentesis over the next several weeks. This need should decrease with successful use of the shunt.  Signed,  Yvone NeuJaime S. Loreta AveWagner, DO  [facility]  [facility]   Electronically Signed   By: Gilmer MorJaime  Wagner D.O.   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 Not a candidate for TIPS.  EXAM: ULTRASOUND GUIDED PERITONEAL ACCESS FOR PARACENTESIS.  ULTRASOUND-GUIDED PLEURAL ACCESS FOR  THORACENTESIS.  ULTRASOUND GUIDED RIGHT INTERNAL JUGULAR ACCESS FOR SHUNT PLACEMENT.  DENVER SHUNT (PERITONEAL TO VENOUS) TUNNELED CATHETER -- [address] -- WITH COMPLICATING  MEDICAL CONDITIONS.  CLINICAL DATA:  53 year old male with a history of hepatitis-C and alcoholic cirrhosis. He has been admitted on October 12 with decompensated recurrent ascites. The patient has a MELD score of at least 18, with Child Pugh category C cirrhosis/liver disease, with mild encephalopathy, and is not a candidate for TIPS.  He has been failing medical therapy for recurrent ascites and a right-sided hepatic hydrothorax. He has become hyponatremic with multiple medical problems.  Previous right-sided thoracentesis has yielded 1.5 L October 13, 2013 and 2.0 L October 15, 2013.  Complicating medical history includes cirrhosis, ascites, thrombocytopenia, coagulopathy, hepatitis-C, prior inguinal hernia and chronic alcoholism.  MEDICATIONS: Fentanyl 50 mcg IV; Versed 1.5 mg IV  ANESTHESIA/SEDATION: Sedation Time  116 minutes  Fluoro time: 36 seconds  CONTRAST:  None  COMPLICATIONS: None immediate.  PROCEDURE: Informed consent was obtained from the patient following an explanation of the procedure, risks, benefits and alternatives. Risks discussed included bleeding, infection, sepsis, shunt failure, requirement for shunt replacement, additional procedures, disseminated intravascular coagulopathy/coagulation (DIC), cardiopulmonary collapse, death. The patient understands, agrees and consents for the procedure. All questions were addressed. A time out was performed prior to the initiation of the procedure. The patient was positioned supine on the fluoroscopy table for access to both the right pleural space and the peritoneal space, as well as the right internal jugular vein. The operative site was prepped and draped in the usual sterile fashion.  Ultrasound survey of the abdomen was performed with images stored and sent to PACs.  A suitable approach into the peritoneum was determined for placement of a Yueh needle. The needle was advanced into the peritoneum and ascitic fluid was returned. A 0.035 wire was  advanced and view needle was removed. Serial dilation with 12 French dilator was performed before placing a 16 French peel-away sheath. The peel-away sheath was then attached to vacuum aspiration for paracentesis.  Ultrasound guided as placement of a second Yueh needle was then performed into the right pleural space. Once the needle was removed the catheter was attached to vacuum aspiration for large volume thoracentesis.  Ultrasound survey of the right internal jugular vein was then performed with images stored and sent to PACs. A micropuncture needle was then used access the right internal jugular vein after infiltration of the skin and subcutaneous tissues with 1% lidocaine without epinephrine. Once we have established access to the right internal jugular vein, serial dilation of the tract with 12 [place] dilator was performed, with subsequent placement of 16 French peel-away sheath.  A site on the right lower thorax at the costochondral junction was selected for placement of the shunt pump. A 3 cm incision was performed, with blunt dissection used to create pocket for the pump.  A 2 L bag of body-temperature saline was then infused into the peritoneal space after complete evacuation of the peritoneal ascites. This fluid was then partially evacuated with vacuum suction, with approximately 500 cc of saline remaining.  The peritoneal limb of the shunt catheter was then back tunneled from the pocket towards the puncture site on the right upper quadrant. The catheter was pulled through the tract and the tunnel were removed. The catheter was then placed through the peel-away sheath and the peel-away sheath was removed.  The shunt pump was then positioned in the pocket, with 2 Prolene retention sutures placed. The  venous limb was tunneled along the chest wall after infiltration of the subcutaneous tissues. The catheter was pulled through the neck puncture site. The required length of the venous limb was determined by  placing a J wire through the peel-away sheath and measuring to the right atrium.  The venous limb was amputated at the proper length, and then the entire shunt catheter was primed with the remaining ascitic fluid/saline in the abdomen.  The venous limb was placed through the peel-away at the right IJ and into the right atrium.  A final image was stored.  The shunt pump pocket was closed with a deep layer of Vicryl resorbable suture, a writing subcuticular layer with Monocryl, and interrupted Prolene sutures to assure that the wound does not dehisce with the pressure of pumping the catheter. The peritoneal insertion site was closed with a Vicryl deep suture an a Monocryl suture as well as Derma bond, and the neck incision site was sealed with Dermabond.  The patient tolerated the procedure well and remained hemodynamically stable throughout.  No complications were encountered and no significant blood loss was encountered.  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  PLAN: The patient will need to pump the shunt pump as instructed 20 times in the morning and 20 times in the evening. It is best in the short term if this is done at least every 3-4 hours to assure that there is no debris within the shunt tubing and shunt pump.  A DIC panel will be ordered every 4 hours starting at 9 p.m. on the day of the procedure to assure that the patient does not become coagulopathic. The highest risk is within the first 24 hours.  The patient will receive 7 days of antibiotics. The first 24 hours may be IV.  Pain control.  Follow-up appointment in 7-14 days after discharge with Dr. Loreta Ave in [facility] Clinic. Patient may require occasional repeat thoracentesis over the next several weeks. This need should decrease with successful use of the shunt.  Signed,  Yvone Neu. Loreta Ave, DO  [facility]  [facility]   Electronically Signed   By: Gilmer Mor D.O.   On: 10/23/2013 19:42   Dg Chest Portable 1 View  10/13/2013   CLINICAL DATA:  Pleural effusion.  EXAM: PORTABLE CHEST - 1 VIEW  COMPARISON:  10/12/2013 .  FINDINGS: Opacification the right hemithorax again noted. This is consistent with large pleural effusion. Underlying pulmonary consolidation and/or atelectasis may be present. Left lung is clear. Heart size normal. No acute bony abnormality.  IMPRESSION: Unchanged complete opacification of the right hemithorax, most consistent with large right pleural effusion. Underlying pulmonary consolidation and or atelectasis may be present. Left lung is clear .   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 Not a candidate for TIPS.  EXAM: ULTRASOUND GUIDED PERITONEAL ACCESS FOR PARACENTESIS.  ULTRASOUND-GUIDED PLEURAL ACCESS FOR THORACENTESIS.  ULTRASOUND GUIDED RIGHT INTERNAL JUGULAR ACCESS FOR SHUNT PLACEMENT.  DENVER SHUNT (PERITONEAL TO VENOUS) TUNNELED CATHETER -- [address] -- WITH COMPLICATING MEDICAL CONDITIONS.  CLINICAL DATA:  53 year old male with a history of hepatitis-C and alcoholic cirrhosis. He has been admitted on October 12 with decompensated recurrent ascites. The patient has a MELD score of at least  18, with Child Pugh category C cirrhosis/liver disease, with mild encephalopathy, and is not a candidate for TIPS.  He has been failing medical therapy for recurrent ascites and a right-sided hepatic hydrothorax. He has become hyponatremic with multiple medical problems.  Previous right-sided thoracentesis has yielded 1.5 L October 13, 2013 and 2.0 L October 15, 2013.  Complicating medical history includes cirrhosis, ascites, thrombocytopenia, coagulopathy, hepatitis-C, prior inguinal hernia  and chronic alcoholism.  MEDICATIONS: Fentanyl 50 mcg IV; Versed 1.5 mg IV  ANESTHESIA/SEDATION: Sedation Time  116 minutes  Fluoro time: 36 seconds  CONTRAST:  None  COMPLICATIONS: None immediate.  PROCEDURE: Informed consent was obtained from the patient following an explanation of the procedure, risks, benefits and alternatives. Risks discussed included bleeding, infection, sepsis, shunt failure, requirement for shunt replacement, additional procedures, disseminated intravascular coagulopathy/coagulation (DIC), cardiopulmonary collapse, death. The patient understands, agrees and consents for the procedure. All questions were addressed. A time out was performed prior to the initiation of the procedure. The patient was positioned supine on the fluoroscopy table for access to both the right pleural space and the peritoneal space, as well as the right internal jugular vein. The operative site was prepped and draped in the usual sterile fashion.  Ultrasound survey of the abdomen was performed with images stored and sent to PACs.  A suitable approach into the peritoneum was determined for placement of a Yueh needle. The needle was advanced into the peritoneum and ascitic fluid was returned. A 0.035 wire was advanced and view needle was removed. Serial dilation with 12 French dilator was performed before placing a 16 French peel-away sheath. The peel-away sheath was then attached to vacuum aspiration for paracentesis.  Ultrasound guided as placement of a second Yueh needle was then performed into the right pleural space. Once the needle was removed the catheter was attached to vacuum aspiration for large volume thoracentesis.  Ultrasound survey of the right internal jugular vein was then performed with images stored and sent to PACs. A micropuncture needle was then used access the right internal jugular vein after infiltration of the skin and subcutaneous tissues with 1% lidocaine without epinephrine. Once we have  established access to the right internal jugular vein, serial dilation of the tract with 12 [place] dilator was performed, with subsequent placement of 16 French peel-away sheath.  A site on the right lower thorax at the costochondral junction was selected for placement of the shunt pump. A 3 cm incision was performed, with blunt dissection used to create pocket for the pump.  A 2 L bag of body-temperature saline was then infused into the peritoneal space after complete evacuation of the peritoneal ascites. This fluid was then partially evacuated with vacuum suction, with approximately 500 cc of saline remaining.  The peritoneal limb of the shunt catheter was then back tunneled from the pocket towards the puncture site on the right upper quadrant. The catheter was pulled through the tract and the tunnel were removed. The catheter was then placed through the peel-away sheath and the peel-away sheath was removed.  The shunt pump was then positioned in the pocket, with 2 Prolene retention sutures placed. The venous limb was tunneled along the chest wall after infiltration of the subcutaneous tissues. The catheter was pulled through the neck puncture site. The required length of the venous limb was determined by placing a J wire through the peel-away sheath and measuring to the right atrium.  The venous limb was amputated at the proper length, and then the  entire shunt catheter was primed with the remaining ascitic fluid/saline in the abdomen.  The venous limb was placed through the peel-away at the right IJ and into the right atrium.  A final image was stored.  The shunt pump pocket was closed with a deep layer of Vicryl resorbable suture, a writing subcuticular layer with Monocryl, and interrupted Prolene sutures to assure that the wound does not dehisce with the pressure of pumping the catheter. The peritoneal insertion site was closed with a Vicryl deep suture an a Monocryl suture as well as Derma bond, and the neck  incision site was sealed with Dermabond.  The patient tolerated the procedure well and remained hemodynamically stable throughout.  No complications were encountered and no significant blood loss was encountered.  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  PLAN: The patient will need to pump the shunt pump as instructed 20 times in the morning and 20 times in the evening. It is best in the short term if this is done at least every 3-4 hours to assure that there is no debris within the shunt tubing and shunt pump.  A DIC panel will be ordered every 4 hours starting at 9 p.m. on the day of the procedure to assure that the patient does not become coagulopathic. The highest risk is within the first 24 hours.  The patient will receive 7 days of antibiotics. The first 24 hours may be IV.  Pain control.  Follow-up appointment in 7-14 days after discharge with Dr. Loreta Ave in [facility] Radiology Clinic. Patient may require occasional repeat thoracentesis over the next several weeks. This need should decrease with successful use of the shunt.  Signed,  Yvone Neu. Loreta Ave, DO  [facility]  [facility]   Electronically Signed   By: Gilmer Mor D.O.   On: 10/23/2013 19:42   US Thoracentesis Asp Pleural Space W/img Guide  10/15/2013   CLINICAL DATA:  Right pleural effusion  EXAM: ULTRASOUND GUIDED right THORACENTESIS  COMPARISON:  Previous right thoracentesis  PROCEDURE: An ultrasound guided thoracentesis was thoroughly discussed with the patient and questions answered. The benefits, risks, alternatives and complications were also discussed. The patient understands and wishes to proceed with the procedure. Written consent was obtained.  Ultrasound was performed to localize and mark an adequate pocket of fluid in the row chest. The area was then prepped and  draped in the normal sterile fashion. 1% Lidocaine was used for local anesthesia. Under ultrasound guidance a 19 gauge Yueh catheter was introduced. Thoracentesis was performed. The catheter was removed and a dressing applied.  Complications:  None  FINDINGS: A total of approximately 2 L of orange reddish fluid was removed. A fluid sample was notsent for laboratory analysis.  IMPRESSION: Successful ultrasound guided right thoracentesis yielding 2 L of pleural fluid.  Read by:  Robet Leu [facility]   Electronically Signed   By: Gilmer Mor D.O.   On: 10/15/2013 13:51   US Thoracentesis Asp Pleural Space W/img Guide  10/13/2013   CLINICAL DATA:  Right pleural effusion  EXAM: ULTRASOUND GUIDED right THORACENTESIS  COMPARISON:  Previous thoracentesis  PROCEDURE: An ultrasound guided thoracentesis was thoroughly discussed with the patient and questions answered. The benefits, risks, alternatives and complications were also discussed. The patient understands and wishes to proceed with the procedure. Written consent was obtained.  Ultrasound was performed to localize and mark an  adequate pocket of fluid in the right chest. The area was then prepped and draped in the normal sterile fashion. 1% Lidocaine was used for local anesthesia. Under ultrasound guidance a 19 gauge Yueh catheter was introduced. Thoracentesis was performed. The catheter was removed and a dressing applied.  Complications:  None  FINDINGS: A total of approximately 1.5 L of orange reddish fluid was removed. A fluid sample was notsent for laboratory analysis.  IMPRESSION: Successful ultrasound guided right thoracentesis yielding 1.5 L of pleural fluid.  Read by:  Robet Leu   Electronically Signed   By: Malachy Moan M.D.   On: 10/13/2013 15:59    DISCHARGE EXAMINATION: Filed Vitals:   10/26/13 1517 10/26/13 2152 10/27/13 0500 10/27/13 0701  BP: 110/61 97/61  94/54  Pulse: 88 95  86  Temp: 98.5 F (36.9 C) 99 F (37.2 C)  98.4 F  (36.9 C)  TempSrc: Oral Oral  Axillary  Resp: 18 18  18   Height:      Weight:   80.922 kg (178 lb 6.4 oz)   SpO2: 94% 91%  92%   General appearance: alert, cooperative, appears stated age and no distress Resp: clear to auscultation bilaterally Cardio: regular rate and rhythm, S1, S2 normal, no murmur, click, rub or gallop GI: distended and bruising noted on right which is stable. BS present. Mildly tender. No masses. Extremities: minimal edema  DISPOSITION: Home with home health  Discharge Instructions   Diet - low sodium heart healthy    Complete by:  As directed      Discharge instructions    Complete by:  As directed   Please be sure to follow up with your PCP for blood work within 1 week. Other follow ups as outlined. Restrict fluids to 1liter per day.     Increase activity slowly    Complete by:  As directed            ALLERGIES:  Allergies  Allergen Reactions  . Bee Venom Swelling    Current Discharge Medication List    START taking these medications   Details  amoxicillin (AMOXIL) 500 MG capsule Take 1 capsule (500 mg total) by mouth every 8 (eight) hours. For 5 more days Qty: 15 capsule, Refills: 0    fentaNYL (DURAGESIC - DOSED MCG/HR) 25 MCG/HR patch Place 1 patch (25 mcg total) onto the skin every 3 (three) days. Qty: 10 patch, Refills: 0    omeprazole (PRILOSEC) 40 MG capsule Take 1 capsule (40 mg total) by mouth daily. Qty: 30 capsule, Refills: 2    polyethylene glycol (MIRALAX / GLYCOLAX) packet Take 17 g by mouth daily as needed for moderate constipation. Qty: 14 each, Refills: 0    saccharomyces boulardii (FLORASTOR) 250 MG capsule Take 1 capsule (250 mg total) by mouth 2 (two) times daily. For 5 days Qty: 10 capsule, Refills: 0      CONTINUE these medications which have CHANGED   Details  furosemide (LASIX) 40 MG tablet Take 1 tablet (40 mg total) by mouth daily. Qty: 30 tablet, Refills: 2    oxyCODONE (ROXICODONE) 5 MG immediate release  tablet Take 1 tablet (5 mg total) by mouth every 4 (four) hours as needed for severe pain. Qty: 15 tablet, Refills: 0      CONTINUE these medications which have NOT CHANGED   Details  albuterol (PROVENTIL HFA;VENTOLIN HFA) 108 (90 BASE) MCG/ACT inhaler Inhale 2 puffs into the lungs daily as needed for wheezing or shortness of  breath.    clonazePAM (KLONOPIN) 0.5 MG tablet Take 1 tablet (0.5 mg total) by mouth daily as needed for anxiety. Qty: 10 tablet, Refills: 0    docusate sodium (COLACE) 100 MG capsule Take 1 capsule (100 mg total) by mouth 2 (two) times daily as needed for mild constipation. Hold if you have diarrhea. Qty: 10 capsule, Refills: 0    folic acid (FOLVITE) 1 MG tablet Take 1 tablet (1 mg total) by mouth daily. Qty: 30 tablet, Refills: 0    lactulose (CHRONULAC) 10 GM/15ML solution Take 45 mLs (30 g total) by mouth daily. Qty: 240 mL, Refills: 6    Multiple Vitamin (MULTIVITAMIN WITH MINERALS) TABS tablet Take 1 tablet by mouth daily.    tamsulosin (FLOMAX) 0.4 MG CAPS capsule Take 1 capsule (0.4 mg total) by mouth daily. Qty: 30 capsule, Refills: 1    thiamine 100 MG tablet Take 1 tablet (100 mg total) by mouth daily. Qty: 30 tablet, Refills: 0      STOP taking these medications     spironolactone (ALDACTONE) 100 MG tablet        Follow-up Information   Follow up with Mike Gip, PA-C On 11/20/2013. (10:30 follow up in GI/liver office)    Specialty:  Gastroenterology   Contact information:   [address] [place] [address] [phone]       Follow up with [name], MD. Schedule an appointment as soon as possible for a visit in 1 week. (post hospitalization follow up and for blood work [employer]) to check sodium levs and renal function)    Specialty:  Internal Medicine   Contact information:   [address] [place] [address] [phone]       Schedule an appointment as soon as possible for a visit with WAGNER, JAIME S, DO. (to  see in 7-10 days for the denver shunt and for pleural effusion.)    Specialty:  Interventional Radiology   Contact information:   [address] [place] [address] [phone]       TOTAL DISCHARGE TIME: 35 mins  [facility]  [facility] Pager [phone]  10/27/2013, 11:21 AM

## 2013-10-27 NOTE — Progress Notes (Signed)
  Subjective: Feeling fine, peeing more, breathing comfortably  Objective: Vital signs in last 24 hours: Temp:  [98.4 F (36.9 C)-99 F (37.2 C)] 98.4 F (36.9 C) (10/26 0701) Pulse Rate:  [86-95] 86 (10/26 0701) Resp:  [18] 18 (10/26 0701) BP: (94-110)/(54-61) 94/54 mmHg (10/26 0701) SpO2:  [91 %-94 %] 92 % (10/26 0701) Weight:  [178 lb 6.4 oz (80.922 kg)] 178 lb 6.4 oz (80.922 kg) (10/26 0500) Weight change: 1 lb 11.2 oz (0.771 kg)  Intake/Output from previous day: 10/25 0701 - 10/26 0700 In: 1658 [P.O.:1658] Out: 2025 [Urine:2025] Intake/Output this shift:    General appearance: alert and cooperative CV reg Gr 2/6 M,  Lungs decreased bs, rales in bases GI: marked ascites pos FW, liver down 7 cm Extremities: 2+ edema BLE to mid calf  Lab Results:  Recent Labs  10/25/13 0502  10/26/13 1147 10/27/13 0310  WBC 6.7  --   --  5.8  HGB 10.0*  --   --  9.0*  HCT 28.4*  --   --  25.1*  PLT 86*  81*  < > 87* 77*  81*  < > = values in this interval not displayed. BMET:   Recent Labs  10/26/13 0500 10/27/13 0310  NA 124* 128*  K 3.0* 3.1*  CL 86* 89*  CO2 31 29  GLUCOSE 122* 95  BUN 55* 48*  CREATININE 1.50* 1.24  CALCIUM 7.3* 7.2*   No results found for this basename: PTH,  in the last 72 hours Iron Studies: No results found for this basename: IRON, TIBC, TRANSFERRIN, FERRITIN,  in the last 72 hours  Scheduled: . sodium chloride   Intravenous Once  . sodium chloride   Intravenous Once  . amoxicillin  500 mg Oral 3 times per day  . clonazePAM  0.5 mg Oral QHS  . fentaNYL  25 mcg Transdermal Q72H  . folic acid  1 mg Oral Daily  . lactulose  30 g Oral Daily  . pantoprazole  40 mg Oral Q0600  . saccharomyces boulardii  250 mg Oral BID  . sodium chloride  3 mL Intravenous Q12H  . tamsulosin  0.4 mg Oral Daily  . thiamine  100 mg Oral Daily     LOS: 16 days    Assessment/Plan:  [age]yo male with alcoholic cirrhosis, ESLD and and recurrent pleural  effusions. Nephrology consulted for chronic hyponatremia. UOP and Na rising today 1. Hyponatremia, hypervolemic- in setting of cirrhosis/ascites/anasarca. Improved.  Cr rose with large vol fluid removal, avoid large vol paracenteses Cont with Fluid restrict to 1 liter/day Holding diuretics (lasix, spironolactone and metolazone). Will discharge on 40mg  oral lasix 2. Cirrhosis- per primary 3. PUD- No varices on endo but esophageal ulcer, likely peptic.  Started on PPI 3.   Ascites/anasarca/pleaural effusion- s/p denver shunt and thoracentesis 4.   DIC - mild, asymptomatic, heme on board and monitoring DIC panels 5.   Dispo: likely home later today   Beverely LowElena [name], MD, MPH [facility] PGY-2 10/27/2013 7:41 AM I have seen and examined this patient and agree with the plan of care seen, eval, examined, and discussed. Will see again at your request .  Salvatrice Morandi L 10/27/2013, 2:01 PM

## 2013-10-28 ENCOUNTER — Inpatient Hospital Stay: Payer: Medicaid - Out of State | Admitting: Internal Medicine

## 2013-10-28 MED ORDER — POTASSIUM CHLORIDE CRYS ER 20 MEQ PO TBCR: 20.0000 meq | EXTENDED_RELEASE_TABLET | Freq: Every day | ORAL | Status: DC

## 2013-10-28 MED ORDER — DOCUSATE SODIUM 100 MG PO CAPS: 100.0000 mg | ORAL_CAPSULE | Freq: Two times a day (BID) | ORAL | Status: DC | PRN

## 2013-10-28 MED ORDER — POTASSIUM CHLORIDE CRYS ER 20 MEQ PO TBCR
40.0000 meq | EXTENDED_RELEASE_TABLET | Freq: Once | ORAL | Status: AC
  Administered 2013-10-28: 40 meq via ORAL
  Filled 2013-10-28: qty 2

## 2013-10-28 MED ORDER — CLONAZEPAM 0.5 MG PO TABS: 0.5000 mg | ORAL_TABLET | Freq: Every day | ORAL | Status: DC | PRN

## 2013-10-28 MED ORDER — LACTULOSE 10 GM/15ML PO SOLN: 30.0000 g | Freq: Every day | ORAL | Status: DC

## 2013-10-28 MED ORDER — FUROSEMIDE 40 MG PO TABS
40.0000 mg | ORAL_TABLET | Freq: Every day | ORAL | Status: DC
  Administered 2013-10-28: 40 mg via ORAL
  Filled 2013-10-28: qty 1

## 2013-10-28 NOTE — Progress Notes (Signed)
SATURATION QUALIFICATIONS: (This note is used to comply with regulatory documentation for home oxygen)  Patient Saturations on Room Air at Rest = 95%  Patient Saturations on Room Air while Ambulating = 87%  Patient Saturations on 2 Liters of oxygen while Ambulating = 95%  Please briefly explain why patient needs home oxygen: Respiratory Failure

## 2013-10-28 NOTE — Progress Notes (Signed)
PROGRESS NOTE    Xavier DuskyRichard L Valentine [id number] DOB: 1960/09/05 DOA: 10/11/2013  PCP: Doris CheadleADVANI, DEEPAK, MD  HPI/Brief narrative 53 year old male with history of hep C/EtOH cirrhosis, coagulopathy, thrombocytopenia, right-sided transudative pleural effusions s/p thoracentesis x1 on 09/27/13, discharged from hospital on 10/10/13, are admitted on 10/11/13 secondary to worsening left-sided chest wall pain and noted to have large right-sided pleural effusion. During previous admission, pulmonology had recommended treating his pleural effusion with Lasix and holding of thoracentesis. Pulmonology was consulted again patient undergoing repeated right thoracentesis by IR. Palliative care consulted for goals of care on 10/13. Patient underwent EGD and placement of [place] shunt. He is experiencing low grade DIC which was being monitored closely.   Assessment/Plan:  1. Hep C/ETOH cirrhosis/coagulopathy/thrombocytopenia/Hypoalbuminemia: As per patient, has not had any alcohol for about 3 months. Rifaximin stopped by GI. Continue Lactulose. Ammonia normal. Patient apparently had followup appointment with local hepatology clinic and GI and has never followed up. GI has been following inpatient and has now signed off. Follow up with Dr. Christella HartiganJacobs in 4 weeks. EGD 10/21 for evaluation of varices found no varices. Esophageal ulcer noted. Patient underwent placement of [place] shunt by IR on 10/22. Patient educated on how to use the shunt. 2. DIC: Low grade DIC noted which can occur after placement of [place] shunt. Discussed with Dr. Clelia CroftShadad on 10/23 and appreciate his input. No FFP unless he has bleeding. Platelet transfusion only if counts drop below 75k. DIC labs remain stable. Antibiotics recommended by IR and so initialed on Amoxicillin. 3. 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 Pulmonology was consulted and initially placed  him on IV Lasix and oral Aldactone, without significant improvement. Aldactone discontinued secondary to hyperkalemia. Patient underwent multiple thoracentesis but always reaccumulated. Pulmonary advised no chest tube due to difficulty removing (hepatic hydrothorax) and consider antibiotics if declines (fever, leukocytosis). Pleural fluid culture negative to date. Underwent left thoracocentesis on 10/14. Repeat cxr shows no left sided effusion. Stable from respiratory standpoint. Will continue to have respiratory issues as a result. Will need home O2.  4. Left sided chest pain/ 7 & 8 rib fractures: This was made worse by dyspnea from problem #1. Pain management and monitor. Improved. 5. Chronic hyponatremia/?SIADH/Elevated Creatnine:  Sodium level is fluctuating. Nephrology was assisting. Low dose lasix per Nephrology. Potassium as well.  6. History of alcohol abuse: Ativan protocol. No overt withdrawal. 7. Ascites: recent ultrasound apparently had not enough ascites to tap. Denies abdominal pain. No features suggestive of SBP. Monitor. 8. Failure to thrive: Seen by PMT. Patient wants to pursue all treatment options. DNR. 9. Hyperkalemia: Resolved. Kayexalate was given and Aldactone was stopped.  10. Distal Esophageal Ulcer: Noted on EGD. On PPI. May need repeat EGD in 8 weeks.   DVT Prophylaxis: SCD's Code Status: DO NOT RESUSCITATE Family Communication: Discussed with patient.  Disposition Plan: Ok for discharge. Could not go yesterday as there a problem arranging home O2.    Consultants:  Pulmonology  Palliative care team-pending  Gastroenterology  IR  Nephrology  Procedures: Therapeutic thoracentesis 10/11, 10/12, 10/14  EGD 10/21  ENDOSCOPIC IMPRESSION:  1. Distal esophageal ulcer. Likely peptic. Status post biopsy  2. NO esophageal varices... Mild portal gastropathy  [place] Shunt Placement 10/22  Antibiotics:  None  Subjective: Feels well. No complaints.    Objective: Filed Vitals:   10/27/13 0701 10/27/13 1345 10/27/13 2210 10/28/13 0637  BP: 94/54 96/55 103/60 102/53  Pulse: 86 85 92 82  Temp: 98.4  F (36.9 C) 98.1 F (36.7 C) 97.6 F (36.4 C) 97.9 F (36.6 C)  TempSrc: Axillary Oral Oral Oral  Resp: 18 18 18 18   Height:      Weight:    82.2 kg (181 lb 3.5 oz)  SpO2: 92% 93% 96% 98%    Intake/Output Summary (Last 24 hours) at 10/28/13 1012 Last data filed at 10/27/13 1700  Gross per 24 hour  Intake    480 ml  Output      2 ml  Net    478 ml   Filed Weights   10/26/13 0508 10/27/13 0500 10/28/13 0637  Weight: 80.151 kg (176 lb 11.2 oz) 80.922 kg (178 lb 6.4 oz) 82.2 kg (181 lb 3.5 oz)     Exam:  General exam: chronically ill-looking male lying comfortably on bed.  Respiratory system: improved air entry but diminished at bases.  Cardiovascular system: S1 & S2 heard, RRR. No JVD, murmurs, gallops, clicks or pedal edema. Dressing noted over right lower chest. Gastrointestinal system: Abdomen is mildly distended, soft and nontender. Bruising noted over the right side along where the shunt is located. Central nervous system: Alert and oriented. No focal neurological deficits.  Data Reviewed: Basic Metabolic Panel:  Recent Labs Lab 10/23/13 0519 10/24/13 0824 10/25/13 0502 10/26/13 0500 10/27/13 0310  NA 121* 121* 126* 124* 128*  K 4.3 4.2 3.2* 3.0* 3.1*  CL 85* 84* 87* 86* 89*  CO2 27 30 31 31 29   GLUCOSE 94 111* 77 122* 95  BUN 51* 43* 46* 55* 48*  CREATININE 1.14 1.01 1.22 1.50* 1.24  CALCIUM 8.1* 7.4* 7.8* 7.3* 7.2*  PHOS 4.9* 2.7  --   --   --    Liver Function Tests:  Recent Labs Lab 10/23/13 0519 10/24/13 0824  AST 58*  --   ALT 39  --   ALKPHOS 110  --   BILITOT 2.3*  --   PROT 6.5  --   ALBUMIN 1.7*  1.6* 1.8*   CBC:  Recent Labs Lab 10/23/13 0519  10/24/13 0046 10/24/13 0337  10/25/13 0502  10/25/13 1658 10/25/13 2315 10/26/13 0500 10/26/13 1147 10/27/13 0310  WBC 9.8  --  7.3  7.7  --  6.7  --   --   --   --   --  5.8  NEUTROABS 6.6  --   --   --   --   --   --   --   --   --   --   --   HGB 10.9*  --  9.2* 9.1*  --  10.0*  --   --   --   --   --  9.0*  HCT 29.9*  --  25.8* 25.1*  --  28.4*  --   --   --   --   --  25.1*  MCV 87.2  --  90.5 86.6  --  87.9  --   --   --   --   --  87.5  PLT 132*  < > 83*  82* 92*  88*  < > 86*  81*  < > 72* 89* 77* 87* 77*  81*  < > = values in this interval not displayed. BNP (last 3 results)  Recent Labs  09/02/13 0520 09/09/13 0430 10/07/13 1029  PROBNP 348.5* 383.9* 241.7*   CBG: No results found for this basename: GLUCAP,  in the last 168 hours   Scheduled Meds: . sodium chloride  Intravenous Once  . sodium chloride   Intravenous Once  . amoxicillin  500 mg Oral 3 times per day  . clonazePAM  0.5 mg Oral QHS  . fentaNYL  25 mcg Transdermal Q72H  . folic acid  1 mg Oral Daily  . furosemide  40 mg Oral Daily  . lactulose  30 g Oral Daily  . pantoprazole  40 mg Oral Q0600  . potassium chloride  40 mEq Oral Once  . saccharomyces boulardii  250 mg Oral BID  . sodium chloride  3 mL Intravenous Q12H  . tamsulosin  0.4 mg Oral Daily  . thiamine  100 mg Oral Daily   Continuous Infusions:   Principal Problem:   Recurrent right pleural effusion Active Problems:   Hyponatremia   possible encephalopathy, hepatic   Ascites due to alcoholic cirrhosis   BPH (benign prostatic hyperplasia)   Liver cirrhosis   Alcohol abuse, in remission   Hepatitis C   Acute respiratory failure with hypoxia   Weakness generalized   Palliative care encounter   Abdominal pain   Esophageal ulcer   Time spent: 20 mins   Osvaldo ShipperKRISHNAN,Tashya Alberty, MD  [facility] Pager [phone]  If 7PM-7AM, please contact night-coverage [email or web address] Password [email or web address] 10/28/2013, 10:12 AM    LOS: 17 days

## 2013-11-03 ENCOUNTER — Telehealth: Payer: Self-pay | Admitting: Internal Medicine

## 2013-11-03 NOTE — Telephone Encounter (Signed)
Spoke to pt regarding question about sodium intake with leg swelling. Pt is taking Lasix 40 mg tablet. Instructed him to take 2 tablets Lasix and return at next scheduled visit 11/05/13 with Dr. Orpah CobbAdvani

## 2013-11-03 NOTE — Telephone Encounter (Signed)
Pt. Called stating that he ate seafood during the weekend and was told not to eat sodium, pt. States that both of his legs are swollen and would like to speak to nurse. Please f/u with pt.

## 2013-11-04 ENCOUNTER — Telehealth: Payer: Self-pay | Admitting: Emergency Medicine

## 2013-11-04 ENCOUNTER — Telehealth: Payer: Self-pay

## 2013-11-04 NOTE — Telephone Encounter (Signed)
Returned patient phone call.  Message was left with "Call a  Nurse" Patient stated he did speak with someone yesterday and has an appointment for Tomorrow 11/05/2013 at 10:30 am to see a provider

## 2013-11-04 NOTE — Telephone Encounter (Signed)
PT FINALLY RETURNED CALL TO SET UP F/U APPT W DR Loreta AveWAGNER.  PT DENIES FEVER, CHILLS, SWEATS.  STATES THAT THE JUGULAR ACCESS IS HEALING NICELY.  HAVING ISSUES W/ BLE EDEMA, HAS BEEN TAKING HIS LASIX AND IS HELPING.   PT IS SEEING DR ADVANI DEEPAK TOMORROW, TOLD HIM TO ADDRESS ANY ISSUES AT THAT VISIT, AND IF HE NEEDS TO BE SEEN HERE SOONER TO CALL US .   SCHEDULED APPT TO SEE DR WAGNER ON 11-11-13 AT 1100/1045AM.

## 2013-11-05 ENCOUNTER — Encounter: Payer: Self-pay | Admitting: Internal Medicine

## 2013-11-05 ENCOUNTER — Ambulatory Visit: Payer: Medicaid - Out of State | Attending: Internal Medicine | Admitting: Internal Medicine

## 2013-11-05 VITALS — BP 116/72 | HR 96 | Temp 97.9°F | Resp 16 | Wt 194.0 lb

## 2013-11-05 DIAGNOSIS — R062 Wheezing: Secondary | ICD-10-CM

## 2013-11-05 DIAGNOSIS — E876 Hypokalemia: Secondary | ICD-10-CM

## 2013-11-05 DIAGNOSIS — F1021 Alcohol dependence, in remission: Secondary | ICD-10-CM | POA: Insufficient documentation

## 2013-11-05 DIAGNOSIS — R600 Localized edema: Secondary | ICD-10-CM

## 2013-11-05 DIAGNOSIS — F419 Anxiety disorder, unspecified: Secondary | ICD-10-CM | POA: Insufficient documentation

## 2013-11-05 DIAGNOSIS — Z23 Encounter for immunization: Secondary | ICD-10-CM | POA: Insufficient documentation

## 2013-11-05 DIAGNOSIS — K5900 Constipation, unspecified: Secondary | ICD-10-CM

## 2013-11-05 DIAGNOSIS — Z79899 Other long term (current) drug therapy: Secondary | ICD-10-CM | POA: Insufficient documentation

## 2013-11-05 DIAGNOSIS — N400 Enlarged prostate without lower urinary tract symptoms: Secondary | ICD-10-CM

## 2013-11-05 DIAGNOSIS — Z87891 Personal history of nicotine dependence: Secondary | ICD-10-CM | POA: Insufficient documentation

## 2013-11-05 DIAGNOSIS — K2210 Ulcer of esophagus without bleeding: Secondary | ICD-10-CM

## 2013-11-05 DIAGNOSIS — K7031 Alcoholic cirrhosis of liver with ascites: Secondary | ICD-10-CM

## 2013-11-05 DIAGNOSIS — F411 Generalized anxiety disorder: Secondary | ICD-10-CM

## 2013-11-05 LAB — COMPLETE METABOLIC PANEL WITH GFR
ALBUMIN: 1.9 g/dL — AB (ref 3.5–5.2)
ALT: 22 U/L (ref 0–53)
AST: 42 U/L — ABNORMAL HIGH (ref 0–37)
Alkaline Phosphatase: 80 U/L (ref 39–117)
BUN: 24 mg/dL — ABNORMAL HIGH (ref 6–23)
CALCIUM: 7.1 mg/dL — AB (ref 8.4–10.5)
CHLORIDE: 95 meq/L — AB (ref 96–112)
CO2: 26 meq/L (ref 19–32)
Creat: 1.07 mg/dL (ref 0.50–1.35)
GFR, Est Non African American: 78 mL/min
Glucose, Bld: 111 mg/dL — ABNORMAL HIGH (ref 70–99)
Potassium: 3.6 mEq/L (ref 3.5–5.3)
Sodium: 131 mEq/L — ABNORMAL LOW (ref 135–145)
TOTAL PROTEIN: 6.3 g/dL (ref 6.0–8.3)
Total Bilirubin: 3.5 mg/dL — ABNORMAL HIGH (ref 0.2–1.2)

## 2013-11-05 MED ORDER — TAMSULOSIN HCL 0.4 MG PO CAPS: 0.4000 mg | ORAL_CAPSULE | Freq: Every day | ORAL | Status: AC

## 2013-11-05 MED ORDER — LACTULOSE 10 GM/15ML PO SOLN: 30.0000 g | Freq: Every day | ORAL | Status: AC

## 2013-11-05 MED ORDER — OMEPRAZOLE 40 MG PO CPDR: 40.0000 mg | DELAYED_RELEASE_CAPSULE | Freq: Every day | ORAL | Status: AC

## 2013-11-05 MED ORDER — DOCUSATE SODIUM 100 MG PO CAPS: 100.0000 mg | ORAL_CAPSULE | Freq: Two times a day (BID) | ORAL | Status: AC | PRN

## 2013-11-05 MED ORDER — POLYETHYLENE GLYCOL 3350 17 G PO PACK
17.0000 g | PACK | Freq: Every day | ORAL | Status: AC | PRN
Start: 2013-11-05 — End: ?

## 2013-11-05 MED ORDER — FENTANYL 25 MCG/HR TD PT72: 25.0000 ug | MEDICATED_PATCH | TRANSDERMAL | Status: AC

## 2013-11-05 MED ORDER — POTASSIUM CHLORIDE CRYS ER 20 MEQ PO TBCR: 20.0000 meq | EXTENDED_RELEASE_TABLET | Freq: Every day | ORAL | Status: AC

## 2013-11-05 MED ORDER — FOLIC ACID 1 MG PO TABS: 1.0000 mg | ORAL_TABLET | Freq: Every day | ORAL | Status: AC

## 2013-11-05 MED ORDER — CLONAZEPAM 0.5 MG PO TABS: 0.5000 mg | ORAL_TABLET | Freq: Every day | ORAL | Status: AC | PRN

## 2013-11-05 MED ORDER — SACCHAROMYCES BOULARDII 250 MG PO CAPS: 250.0000 mg | ORAL_CAPSULE | Freq: Two times a day (BID) | ORAL | Status: AC

## 2013-11-05 MED ORDER — ALBUTEROL SULFATE HFA 108 (90 BASE) MCG/ACT IN AERS: 2.0000 | INHALATION_SPRAY | Freq: Every day | RESPIRATORY_TRACT | Status: AC | PRN

## 2013-11-05 MED ORDER — FUROSEMIDE 40 MG PO TABS: 40.0000 mg | ORAL_TABLET | Freq: Every day | ORAL | Status: AC

## 2013-11-05 NOTE — Progress Notes (Signed)
MRN: [id number] Name: Xavier Valentine  Sex: male Age: 53 y.o. DOB: 1960-11-01  Allergies: Bee venom  Chief Complaint  Patient presents with  . Follow-up    HPI: Patient is 53 y.o. male whohas History of hepatitis C/alcoholic cirrhosis, coagulopathy, right-sided pleural effusion status post thoracocentesis in the past, he was recently hospitalized with her left-sided chest wall pain and noted to have right-sided pleural effusion, EMR reviewed  patient underwent EGD and placement of [place] shunt, pulmonology palliative team GI and nephrology was on board patient also had a low-grade DIC after the shunt patient did not require 50s and did not had any bleeding had a platelet transfusion patient was also given antibiotic patient was also advised to follow with her GI, his Aldactone was also discontinued because of hyperkalemia because of hyponatremia recommended patient to have a flu restriction, patient was discharged with Lasix and advised to repeat BMP, EGD showed distal esophageal ulcer patient is on PPI and is scheduled to follow with the GI.patient is requesting refill on his medications.  Past Medical History  Diagnosis Date  . Cirrhosis 08/2013    ETOH and Hep C  . Ascites 09/2013  . Thrombocytopenia 09/2013  . Coagulopathy 09/2013    due to hepatic dysfunction.   . Pneumonia 09/2013  . Chronic alcoholism   . Hepatitis C dx'd ~ 2000  . Anxiety   . Inguinal hernia     repaired in [place] 08/2013    Past Surgical History  Procedure Laterality Date  . Incision and drainage perirectal abscess  1980's  . Inguinal hernia repair Right 08/2013    at [facility] in [place].   . Esophagogastroduodenoscopy N/A 10/22/2013    Procedure: ESOPHAGOGASTRODUODENOSCOPY (EGD);  Surgeon: Hilarie Fredrickson, MD;  Location: [facility] ENDOSCOPY;  Service: Endoscopy;  Laterality: N/A;  rule out varices prior to [facility] shunt placement      Medication List       This list is accurate as of: 11/05/13 12:16 PM.   Always use your most recent med list.               albuterol 108 (90 BASE) MCG/ACT inhaler  Commonly known as:  PROVENTIL HFA;VENTOLIN HFA  Inhale 2 puffs into the lungs daily as needed for wheezing or shortness of breath.     amoxicillin 500 MG capsule  Commonly known as:  AMOXIL  Take 1 capsule (500 mg total) by mouth every 8 (eight) hours. For 5 more days     clonazePAM 0.5 MG tablet  Commonly known as:  KLONOPIN  Take 1 tablet (0.5 mg total) by mouth daily as needed for anxiety.     docusate sodium 100 MG capsule  Commonly known as:  COLACE  Take 1 capsule (100 mg total) by mouth 2 (two) times daily as needed for mild constipation. Hold if you have diarrhea.     fentaNYL 25 MCG/HR patch  Commonly known as:  DURAGESIC - dosed mcg/hr  Place 1 patch (25 mcg total) onto the skin every 3 (three) days.     folic acid 1 MG tablet  Commonly known as:  FOLVITE  Take 1 tablet (1 mg total) by mouth daily.     furosemide 40 MG tablet  Commonly known as:  LASIX  Take 1 tablet (40 mg total) by mouth daily.     lactulose 10 GM/15ML solution  Commonly known as:  CHRONULAC  Take 45 mLs (30 g total) by mouth daily.  multivitamin with minerals Tabs tablet  Take 1 tablet by mouth daily.     omeprazole 40 MG capsule  Commonly known as:  PRILOSEC  Take 1 capsule (40 mg total) by mouth daily.     oxyCODONE 5 MG immediate release tablet  Commonly known as:  ROXICODONE  Take 1 tablet (5 mg total) by mouth every 4 (four) hours as needed for severe pain.     polyethylene glycol packet  Commonly known as:  MIRALAX / GLYCOLAX  Take 17 g by mouth daily as needed for moderate constipation.     potassium chloride SA 20 MEQ tablet  Commonly known as:  K-DUR,KLOR-CON  Take 1 tablet (20 mEq total) by mouth daily.     saccharomyces boulardii 250 MG capsule  Commonly known as:  FLORASTOR  Take 1 capsule (250 mg total) by mouth 2 (two) times daily. For 5 days     tamsulosin 0.4 MG Caps  capsule  Commonly known as:  FLOMAX  Take 1 capsule (0.4 mg total) by mouth daily.     thiamine 100 MG tablet  Take 1 tablet (100 mg total) by mouth daily.        Meds ordered this encounter  Medications  . albuterol (PROVENTIL HFA;VENTOLIN HFA) 108 (90 BASE) MCG/ACT inhaler    Sig: Inhale 2 puffs into the lungs daily as needed for wheezing or shortness of breath.    Dispense:  1 Inhaler    Refill:  3  . clonazePAM (KLONOPIN) 0.5 MG tablet    Sig: Take 1 tablet (0.5 mg total) by mouth daily as needed for anxiety.    Dispense:  10 tablet    Refill:  0  . docusate sodium (COLACE) 100 MG capsule    Sig: Take 1 capsule (100 mg total) by mouth 2 (two) times daily as needed for mild constipation. Hold if you have diarrhea.    Dispense:  60 capsule    Refill:  0  . folic acid (FOLVITE) 1 MG tablet    Sig: Take 1 tablet (1 mg total) by mouth daily.    Dispense:  30 tablet    Refill:  0  . furosemide (LASIX) 40 MG tablet    Sig: Take 1 tablet (40 mg total) by mouth daily.    Dispense:  30 tablet    Refill:  2  . lactulose (CHRONULAC) 10 GM/15ML solution    Sig: Take 45 mLs (30 g total) by mouth daily.    Dispense:  240 mL    Refill:  6  . omeprazole (PRILOSEC) 40 MG capsule    Sig: Take 1 capsule (40 mg total) by mouth daily.    Dispense:  30 capsule    Refill:  2  . polyethylene glycol (MIRALAX / GLYCOLAX) packet    Sig: Take 17 g by mouth daily as needed for moderate constipation.    Dispense:  14 each    Refill:  0  . potassium chloride SA (K-DUR,KLOR-CON) 20 MEQ tablet    Sig: Take 1 tablet (20 mEq total) by mouth daily.    Dispense:  30 tablet    Refill:  2  . saccharomyces boulardii (FLORASTOR) 250 MG capsule    Sig: Take 1 capsule (250 mg total) by mouth 2 (two) times daily. For 5 days    Dispense:  10 capsule    Refill:  0  . tamsulosin (FLOMAX) 0.4 MG CAPS capsule    Sig: Take 1 capsule (0.4 mg total)  by mouth daily.    Dispense:  30 capsule    Refill:  1  .  fentaNYL (DURAGESIC - DOSED MCG/HR) 25 MCG/HR patch    Sig: Place 1 patch (25 mcg total) onto the skin every 3 (three) days.    Dispense:  10 patch    Refill:  0    Immunization History  Administered Date(s) Administered  . Influenza,inj,Quad PF,36+ Mos 10/08/2013  . Pneumococcal Polysaccharide-23 09/28/2013    Family History  Problem Relation Age of Onset  . CAD    . Hypertension    . Diabetes Mother   . Hypertension Mother   . Cancer Father   . Cancer Maternal Grandfather     History  Substance Use Topics  . Smoking status: Former Smoker -- 10 years    Types: Cigarettes  . Smokeless tobacco: Never Used     Comment: "smoked 1/2 pack/wk when I did smoke"  . Alcohol Use: Yes     Comment: 10/07/2013 "I've had 2 drinks since 08/2013; I"ve quit"    Review of Systems   As noted in HPI  Filed Vitals:   11/05/13 1030  BP: 116/72  Pulse: 96  Temp: 97.9 F (36.6 C)  Resp: 16    Physical Exam  Physical Exam  Constitutional: No distress.  Eyes: Scleral icterus is present.  Cardiovascular: Normal rate and regular rhythm.   Pulmonary/Chest:  Minimal wheezing   Abdominal: He exhibits distension. There is no tenderness. There is no rebound.  Musculoskeletal: He exhibits edema.    CBC    Component Value Date/Time   WBC 5.8 10/27/2013 0310   RBC 2.87* 10/27/2013 0310   HGB 9.0* 10/27/2013 0310   HCT 25.1* 10/27/2013 0310   PLT 77* 10/27/2013 0310   PLT 81* 10/27/2013 0310   MCV 87.5 10/27/2013 0310   LYMPHSABS 1.9 10/23/2013 0519   MONOABS 1.2* 10/23/2013 0519   EOSABS 0.2 10/23/2013 0519   BASOSABS 0.0 10/23/2013 0519    CMP     Component Value Date/Time   NA 128* 10/27/2013 0310   K 3.1* 10/27/2013 0310   CL 89* 10/27/2013 0310   CO2 29 10/27/2013 0310   GLUCOSE 95 10/27/2013 0310   BUN 48* 10/27/2013 0310   CREATININE 1.24 10/27/2013 0310   CALCIUM 7.2* 10/27/2013 0310   PROT 6.5 10/23/2013 0519   ALBUMIN 1.8* 10/24/2013 0824   AST 58* 10/23/2013  0519   ALT 39 10/23/2013 0519   ALKPHOS 110 10/23/2013 0519   BILITOT 2.3* 10/23/2013 0519   GFRNONAA 64* 10/27/2013 0310   GFRAA 74* 10/27/2013 0310    No results found for: CHOL  No components found for: HGA1C  Lab Results  Component Value Date/Time   AST 58* 10/23/2013 05:19 AM    Assessment and Plan  Ascites due to alcoholic cirrhosis - Plan:patient is status post [place] shunt , currently taking furosemide (LASIX) 40 MG tablet, we'll check his blood chemistry, patient is going to follow up with vascular as well as GI.  Esophageal ulcer - Plan: omeprazole (PRILOSEC) 40 MG capsule  BPH (benign prostatic hyperplasia) - Plan: tamsulosin (FLOMAX) 0.4 MG CAPS capsule  Pedal edema - Plan: furosemide (LASIX) 40 MG tablet, will check electrolytes  Anxiety state - Plan: clonazePAM (KLONOPIN) 0.5 MG tablet  Wheezing - Plan: albuterol (PROVENTIL HFA;VENTOLIN HFA) 108 (90 BASE) MCG/ACT inhaler  Constipation, unspecified constipation type - Plan: docusate sodium (COLACE) 100 MG capsule, polyethylene glycol (MIRALAX / GLYCOLAX) packet   Health Maintenance  uptodate with flu shot and pneumovax  Return in about 3 months (around 02/05/2014), or if symptoms worsen or fail to improve.  [name], MD

## 2013-11-05 NOTE — Progress Notes (Signed)
Patient states here for follow up on his ascities And in need of medication refills Patient has a [place] Shunt that runs down the  Right side from  His neck to his abd

## 2013-11-06 ENCOUNTER — Telehealth: Payer: Self-pay

## 2013-11-06 ENCOUNTER — Encounter (HOSPITAL_COMMUNITY): Payer: Self-pay | Admitting: Emergency Medicine

## 2013-11-06 DIAGNOSIS — F1020 Alcohol dependence, uncomplicated: Secondary | ICD-10-CM | POA: Diagnosis present

## 2013-11-06 DIAGNOSIS — E162 Hypoglycemia, unspecified: Secondary | ICD-10-CM | POA: Diagnosis not present

## 2013-11-06 DIAGNOSIS — E872 Acidosis: Secondary | ICD-10-CM | POA: Diagnosis present

## 2013-11-06 DIAGNOSIS — R6521 Severe sepsis with septic shock: Secondary | ICD-10-CM | POA: Diagnosis present

## 2013-11-06 DIAGNOSIS — F1290 Cannabis use, unspecified, uncomplicated: Secondary | ICD-10-CM | POA: Diagnosis present

## 2013-11-06 DIAGNOSIS — Z87891 Personal history of nicotine dependence: Secondary | ICD-10-CM

## 2013-11-06 DIAGNOSIS — R64 Cachexia: Secondary | ICD-10-CM | POA: Diagnosis present

## 2013-11-06 DIAGNOSIS — F1410 Cocaine abuse, uncomplicated: Secondary | ICD-10-CM | POA: Diagnosis present

## 2013-11-06 DIAGNOSIS — J9621 Acute and chronic respiratory failure with hypoxia: Secondary | ICD-10-CM | POA: Diagnosis present

## 2013-11-06 DIAGNOSIS — E871 Hypo-osmolality and hyponatremia: Secondary | ICD-10-CM | POA: Diagnosis present

## 2013-11-06 DIAGNOSIS — D684 Acquired coagulation factor deficiency: Secondary | ICD-10-CM | POA: Diagnosis present

## 2013-11-06 DIAGNOSIS — B1920 Unspecified viral hepatitis C without hepatic coma: Secondary | ICD-10-CM | POA: Diagnosis present

## 2013-11-06 DIAGNOSIS — E873 Alkalosis: Secondary | ICD-10-CM | POA: Diagnosis present

## 2013-11-06 DIAGNOSIS — A4151 Sepsis due to Escherichia coli [E. coli]: Principal | ICD-10-CM | POA: Diagnosis present

## 2013-11-06 DIAGNOSIS — K7031 Alcoholic cirrhosis of liver with ascites: Secondary | ICD-10-CM | POA: Diagnosis present

## 2013-11-06 DIAGNOSIS — N170 Acute kidney failure with tubular necrosis: Secondary | ICD-10-CM | POA: Diagnosis not present

## 2013-11-06 DIAGNOSIS — K7290 Hepatic failure, unspecified without coma: Secondary | ICD-10-CM | POA: Diagnosis present

## 2013-11-06 DIAGNOSIS — D696 Thrombocytopenia, unspecified: Secondary | ICD-10-CM | POA: Diagnosis present

## 2013-11-06 DIAGNOSIS — J90 Pleural effusion, not elsewhere classified: Secondary | ICD-10-CM | POA: Diagnosis present

## 2013-11-06 DIAGNOSIS — Z66 Do not resuscitate: Secondary | ICD-10-CM | POA: Diagnosis present

## 2013-11-06 DIAGNOSIS — Z6827 Body mass index (BMI) 27.0-27.9, adult: Secondary | ICD-10-CM

## 2013-11-06 DIAGNOSIS — K652 Spontaneous bacterial peritonitis: Secondary | ICD-10-CM | POA: Diagnosis present

## 2013-11-06 DIAGNOSIS — N400 Enlarged prostate without lower urinary tract symptoms: Secondary | ICD-10-CM | POA: Diagnosis present

## 2013-11-06 DIAGNOSIS — Z515 Encounter for palliative care: Secondary | ICD-10-CM

## 2013-11-06 DIAGNOSIS — D649 Anemia, unspecified: Secondary | ICD-10-CM | POA: Diagnosis present

## 2013-11-06 DIAGNOSIS — Z79899 Other long term (current) drug therapy: Secondary | ICD-10-CM

## 2013-11-06 NOTE — ED Notes (Signed)
Pt. reports SOB with occasional productive cough / chest tightness onset today , family reported mild confusion / disoriented today .

## 2013-11-06 NOTE — Telephone Encounter (Signed)
Kim from [facility] lab called this am with an alert on patients lab work Patient albumin is 1.9 Dr Orpah CobbAdvani  Is aware of these results

## 2013-11-07 ENCOUNTER — Emergency Department (HOSPITAL_COMMUNITY): Payer: Medicaid - Out of State

## 2013-11-07 ENCOUNTER — Inpatient Hospital Stay (HOSPITAL_COMMUNITY)
Admission: EM | Admit: 2013-11-07 | Discharge: 2013-12-02 | DRG: 853 | Disposition: E | Payer: Medicaid - Out of State | Attending: Emergency Medicine | Admitting: Emergency Medicine

## 2013-11-07 ENCOUNTER — Inpatient Hospital Stay (HOSPITAL_COMMUNITY): Payer: Medicaid - Out of State

## 2013-11-07 DIAGNOSIS — E873 Alkalosis: Secondary | ICD-10-CM | POA: Diagnosis present

## 2013-11-07 DIAGNOSIS — N170 Acute kidney failure with tubular necrosis: Secondary | ICD-10-CM | POA: Diagnosis not present

## 2013-11-07 DIAGNOSIS — A419 Sepsis, unspecified organism: Secondary | ICD-10-CM

## 2013-11-07 DIAGNOSIS — F1410 Cocaine abuse, uncomplicated: Secondary | ICD-10-CM | POA: Diagnosis present

## 2013-11-07 DIAGNOSIS — J9600 Acute respiratory failure, unspecified whether with hypoxia or hypercapnia: Secondary | ICD-10-CM

## 2013-11-07 DIAGNOSIS — K7290 Hepatic failure, unspecified without coma: Secondary | ICD-10-CM

## 2013-11-07 DIAGNOSIS — J90 Pleural effusion, not elsewhere classified: Secondary | ICD-10-CM | POA: Insufficient documentation

## 2013-11-07 DIAGNOSIS — E871 Hypo-osmolality and hyponatremia: Secondary | ICD-10-CM | POA: Diagnosis present

## 2013-11-07 DIAGNOSIS — K7031 Alcoholic cirrhosis of liver with ascites: Secondary | ICD-10-CM | POA: Diagnosis present

## 2013-11-07 DIAGNOSIS — R6521 Severe sepsis with septic shock: Secondary | ICD-10-CM

## 2013-11-07 DIAGNOSIS — D684 Acquired coagulation factor deficiency: Secondary | ICD-10-CM | POA: Diagnosis present

## 2013-11-07 DIAGNOSIS — K7460 Unspecified cirrhosis of liver: Secondary | ICD-10-CM | POA: Diagnosis present

## 2013-11-07 DIAGNOSIS — A4189 Other specified sepsis: Secondary | ICD-10-CM | POA: Insufficient documentation

## 2013-11-07 DIAGNOSIS — B1920 Unspecified viral hepatitis C without hepatic coma: Secondary | ICD-10-CM | POA: Diagnosis present

## 2013-11-07 DIAGNOSIS — F1020 Alcohol dependence, uncomplicated: Secondary | ICD-10-CM | POA: Diagnosis present

## 2013-11-07 DIAGNOSIS — K652 Spontaneous bacterial peritonitis: Secondary | ICD-10-CM

## 2013-11-07 DIAGNOSIS — J9601 Acute respiratory failure with hypoxia: Secondary | ICD-10-CM | POA: Insufficient documentation

## 2013-11-07 DIAGNOSIS — N179 Acute kidney failure, unspecified: Secondary | ICD-10-CM

## 2013-11-07 DIAGNOSIS — A4150 Gram-negative sepsis, unspecified: Secondary | ICD-10-CM

## 2013-11-07 DIAGNOSIS — J9621 Acute and chronic respiratory failure with hypoxia: Secondary | ICD-10-CM | POA: Diagnosis present

## 2013-11-07 DIAGNOSIS — Z515 Encounter for palliative care: Secondary | ICD-10-CM | POA: Diagnosis not present

## 2013-11-07 DIAGNOSIS — N400 Enlarged prostate without lower urinary tract symptoms: Secondary | ICD-10-CM | POA: Diagnosis present

## 2013-11-07 DIAGNOSIS — R0602 Shortness of breath: Secondary | ICD-10-CM

## 2013-11-07 DIAGNOSIS — R0603 Acute respiratory distress: Secondary | ICD-10-CM

## 2013-11-07 DIAGNOSIS — Z09 Encounter for follow-up examination after completed treatment for conditions other than malignant neoplasm: Secondary | ICD-10-CM

## 2013-11-07 DIAGNOSIS — J9690 Respiratory failure, unspecified, unspecified whether with hypoxia or hypercapnia: Secondary | ICD-10-CM

## 2013-11-07 DIAGNOSIS — K7682 Hepatic encephalopathy: Secondary | ICD-10-CM

## 2013-11-07 DIAGNOSIS — Z01818 Encounter for other preprocedural examination: Secondary | ICD-10-CM

## 2013-11-07 DIAGNOSIS — D649 Anemia, unspecified: Secondary | ICD-10-CM | POA: Diagnosis present

## 2013-11-07 DIAGNOSIS — Z79899 Other long term (current) drug therapy: Secondary | ICD-10-CM | POA: Diagnosis not present

## 2013-11-07 DIAGNOSIS — R64 Cachexia: Secondary | ICD-10-CM | POA: Diagnosis present

## 2013-11-07 DIAGNOSIS — E872 Acidosis: Secondary | ICD-10-CM | POA: Diagnosis present

## 2013-11-07 DIAGNOSIS — A4151 Sepsis due to Escherichia coli [E. coli]: Secondary | ICD-10-CM | POA: Diagnosis present

## 2013-11-07 DIAGNOSIS — E162 Hypoglycemia, unspecified: Secondary | ICD-10-CM | POA: Diagnosis not present

## 2013-11-07 DIAGNOSIS — F1290 Cannabis use, unspecified, uncomplicated: Secondary | ICD-10-CM | POA: Diagnosis present

## 2013-11-07 DIAGNOSIS — R4182 Altered mental status, unspecified: Secondary | ICD-10-CM | POA: Diagnosis present

## 2013-11-07 DIAGNOSIS — Z87891 Personal history of nicotine dependence: Secondary | ICD-10-CM | POA: Diagnosis not present

## 2013-11-07 DIAGNOSIS — Z66 Do not resuscitate: Secondary | ICD-10-CM | POA: Diagnosis present

## 2013-11-07 DIAGNOSIS — D696 Thrombocytopenia, unspecified: Secondary | ICD-10-CM | POA: Diagnosis present

## 2013-11-07 DIAGNOSIS — Z6827 Body mass index (BMI) 27.0-27.9, adult: Secondary | ICD-10-CM | POA: Diagnosis not present

## 2013-11-07 DIAGNOSIS — Z9889 Other specified postprocedural states: Secondary | ICD-10-CM | POA: Insufficient documentation

## 2013-11-07 LAB — GLUCOSE, CAPILLARY
GLUCOSE-CAPILLARY: 129 mg/dL — AB (ref 70–99)
GLUCOSE-CAPILLARY: 114 mg/dL — AB (ref 70–99)
GLUCOSE-CAPILLARY: 126 mg/dL — AB (ref 70–99)
Glucose-Capillary: 103 mg/dL — ABNORMAL HIGH (ref 70–99)
Glucose-Capillary: 115 mg/dL — ABNORMAL HIGH (ref 70–99)
Glucose-Capillary: 77 mg/dL (ref 70–99)
Glucose-Capillary: <10 mg/dL — CL (ref 70–99)
Glucose-Capillary: 70 mg/dL (ref 70–99)
Glucose-Capillary: 99 mg/dL (ref 70–99)
Glucose-Capillary: <10 mg/dL — CL (ref 70–99)
Glucose-Capillary: 50 mg/dL — ABNORMAL LOW (ref 70–99)
Glucose-Capillary: 108 mg/dL — ABNORMAL HIGH (ref 70–99)

## 2013-11-07 LAB — CARBOXYHEMOGLOBIN
CARBOXYHEMOGLOBIN: 1.7 % — AB (ref 0.5–1.5)
METHEMOGLOBIN: 0.8 % (ref 0.0–1.5)
O2 Saturation: 64.2 %
Total hemoglobin: 8.4 g/dL — ABNORMAL LOW (ref 13.5–18.0)

## 2013-11-07 LAB — POCT I-STAT 3, ART BLOOD GAS (G3+)
ACID-BASE DEFICIT: 10 mmol/L — AB (ref 0.0–2.0)
ACID-BASE DEFICIT: 10 mmol/L — AB (ref 0.0–2.0)
Acid-base deficit: 6 mmol/L — ABNORMAL HIGH (ref 0.0–2.0)
BICARBONATE: 15.7 meq/L — AB (ref 20.0–24.0)
Bicarbonate: 16.5 mEq/L — ABNORMAL LOW (ref 20.0–24.0)
Bicarbonate: 15.7 mEq/L — ABNORMAL LOW (ref 20.0–24.0)
O2 SAT: 91 %
O2 Saturation: 90 %
O2 Saturation: 98 %
PCO2 ART: 22.9 mmHg — AB (ref 35.0–45.0)
PH ART: 7.465 — AB (ref 7.350–7.450)
PO2 ART: 67 mmHg — AB (ref 80.0–100.0)
TCO2: 17 mmol/L (ref 0–100)
TCO2: 17 mmol/L (ref 0–100)
TCO2: 17 mmol/L (ref 0–100)
pCO2 arterial: 32.3 mmHg — ABNORMAL LOW (ref 35.0–45.0)
pCO2 arterial: 33.4 mmHg — ABNORMAL LOW (ref 35.0–45.0)
pH, Arterial: 7.295 — ABNORMAL LOW (ref 7.350–7.450)
pH, Arterial: 7.281 — ABNORMAL LOW (ref 7.350–7.450)
pO2, Arterial: 54 mmHg — ABNORMAL LOW (ref 80.0–100.0)
pO2, Arterial: 122 mmHg — ABNORMAL HIGH (ref 80.0–100.0)

## 2013-11-07 LAB — I-STAT TROPONIN, ED: Troponin i, poc: 0 ng/mL (ref 0.00–0.08)

## 2013-11-07 LAB — TROPONIN I: Troponin I: <0.3 ng/mL (ref <0.30)

## 2013-11-07 LAB — BODY FLUID CELL COUNT WITH DIFFERENTIAL
EOS FL: 0 %
Eos, Fluid: 0 %
LYMPHS FL: 0 %
Lymphs, Fluid: 8 %
MONOCYTE-MACROPHAGE-SEROUS FLUID: 30 % — AB (ref 50–90)
Monocyte-Macrophage-Serous Fluid: 15 % — ABNORMAL LOW (ref 50–90)
Neutrophil Count, Fluid: 85 % — ABNORMAL HIGH (ref 0–25)
Neutrophil Count, Fluid: 62 % — ABNORMAL HIGH (ref 0–25)
Total Nucleated Cell Count, Fluid: 3710 cu mm — ABNORMAL HIGH (ref 0–1000)
WBC FLUID: 6226 uL — AB (ref 0–1000)

## 2013-11-07 LAB — CBC WITH DIFFERENTIAL/PLATELET
BASOS ABS: 0 10*3/uL (ref 0.0–0.1)
Basophils Relative: 0 % (ref 0–1)
EOS ABS: 0.1 10*3/uL (ref 0.0–0.7)
EOS PCT: 1 % (ref 0–5)
HEMATOCRIT: 31.5 % — AB (ref 39.0–52.0)
Hemoglobin: 11 g/dL — ABNORMAL LOW (ref 13.0–17.0)
Lymphocytes Relative: 13 % (ref 12–46)
Lymphs Abs: 1.7 10*3/uL (ref 0.7–4.0)
MCH: 30.7 pg (ref 26.0–34.0)
MCHC: 34.9 g/dL (ref 30.0–36.0)
MCV: 88 fL (ref 78.0–100.0)
Monocytes Absolute: 1.4 10*3/uL — ABNORMAL HIGH (ref 0.1–1.0)
Monocytes Relative: 10 % (ref 3–12)
Neutro Abs: 9.8 10*3/uL — ABNORMAL HIGH (ref 1.7–7.7)
Neutrophils Relative %: 76 % (ref 43–77)
Platelets: 126 10*3/uL — ABNORMAL LOW (ref 150–400)
RBC: 3.58 MIL/uL — ABNORMAL LOW (ref 4.22–5.81)
RDW: 15.4 % (ref 11.5–15.5)
WBC: 13 10*3/uL — ABNORMAL HIGH (ref 4.0–10.5)

## 2013-11-07 LAB — PRO B NATRIURETIC PEPTIDE: Pro B Natriuretic peptide (BNP): 170.7 pg/mL — ABNORMAL HIGH (ref 0–125)

## 2013-11-07 LAB — AMMONIA: Ammonia: 71 umol/L — ABNORMAL HIGH (ref 11–60)

## 2013-11-07 LAB — PHOSPHORUS: PHOSPHORUS: 4.4 mg/dL (ref 2.3–4.6)

## 2013-11-07 LAB — COMPREHENSIVE METABOLIC PANEL
ALT: 21 U/L (ref 0–53)
AST: 42 U/L — AB (ref 0–37)
Albumin: 1.6 g/dL — ABNORMAL LOW (ref 3.5–5.2)
Alkaline Phosphatase: 89 U/L (ref 39–117)
Anion gap: 11 (ref 5–15)
BUN: 28 mg/dL — AB (ref 6–23)
CALCIUM: 7.5 mg/dL — AB (ref 8.4–10.5)
CO2: 23 mEq/L (ref 19–32)
CREATININE: 1.28 mg/dL (ref 0.50–1.35)
Chloride: 95 mEq/L — ABNORMAL LOW (ref 96–112)
GFR calc Af Amer: 71 mL/min — ABNORMAL LOW (ref >90)
GFR calc non Af Amer: 61 mL/min — ABNORMAL LOW (ref >90)
Glucose, Bld: 128 mg/dL — ABNORMAL HIGH (ref 70–99)
Potassium: 4.6 mEq/L (ref 3.7–5.3)
Sodium: 129 mEq/L — ABNORMAL LOW (ref 137–147)
Total Bilirubin: 3.7 mg/dL — ABNORMAL HIGH (ref 0.3–1.2)
Total Protein: 6.5 g/dL (ref 6.0–8.3)

## 2013-11-07 LAB — ALBUMIN, FLUID (OTHER): ALBUMIN FL: 0.2 g/dL

## 2013-11-07 LAB — LACTIC ACID, PLASMA
LACTIC ACID, VENOUS: 1.9 mmol/L (ref 0.5–2.2)
LACTIC ACID, VENOUS: 7.6 mmol/L — AB (ref 0.5–2.2)
Lactic Acid, Venous: 8 mmol/L — ABNORMAL HIGH (ref 0.5–2.2)

## 2013-11-07 LAB — LACTATE DEHYDROGENASE, PLEURAL OR PERITONEAL FLUID
LD FL: 194 U/L — AB (ref 3–23)
LD, Fluid: 554 U/L — ABNORMAL HIGH (ref 3–23)

## 2013-11-07 LAB — I-STAT CG4 LACTIC ACID, ED
LACTIC ACID, VENOUS: 2.06 mmol/L (ref 0.5–2.2)
Lactic Acid, Venous: 1.48 mmol/L (ref 0.5–2.2)

## 2013-11-07 LAB — PROTIME-INR
INR: 2.48 — ABNORMAL HIGH (ref 0.00–1.49)
Prothrombin Time: 27.1 seconds — ABNORMAL HIGH (ref 11.6–15.2)

## 2013-11-07 LAB — APTT: APTT: 48 s — AB (ref 24–37)

## 2013-11-07 LAB — PROTEIN, BODY FLUID
TOTAL PROTEIN, FLUID: 1.1 g/dL
Total protein, fluid: 2.6 g/dL

## 2013-11-07 LAB — LACTATE DEHYDROGENASE: LDH: 247 U/L (ref 94–250)

## 2013-11-07 LAB — MRSA PCR SCREENING: MRSA by PCR: NEGATIVE

## 2013-11-07 LAB — MAGNESIUM: MAGNESIUM: 1.7 mg/dL (ref 1.5–2.5)

## 2013-11-07 LAB — PATHOLOGIST SMEAR REVIEW: PATH REVIEW: REACTIVE

## 2013-11-07 LAB — PROTEIN, TOTAL: Total Protein: 5.2 g/dL — ABNORMAL LOW (ref 6.0–8.3)

## 2013-11-07 LAB — CHOLESTEROL, TOTAL: Cholesterol: 55 mg/dL (ref 0–200)

## 2013-11-07 MED ORDER — ALBUMIN HUMAN 25 % IV SOLN
25.0000 g | Freq: Once | INTRAVENOUS | Status: AC
Start: 2013-11-07 — End: 2013-11-07
  Administered 2013-11-07: 25 g via INTRAVENOUS
  Filled 2013-11-07: qty 100

## 2013-11-07 MED ORDER — SODIUM CHLORIDE 0.9 % IV SOLN: Freq: Once | INTRAVENOUS | Status: DC

## 2013-11-07 MED ORDER — SODIUM CHLORIDE 0.9 % IV SOLN
INTRAVENOUS | Status: DC
  Administered 2013-11-07: 07:00:00 via INTRAVENOUS

## 2013-11-07 MED ORDER — NOREPINEPHRINE BITARTRATE 1 MG/ML IV SOLN
0.0000 ug/min | INTRAVENOUS | Status: DC
  Administered 2013-11-07: 5 ug/min via INTRAVENOUS
  Filled 2013-11-07: qty 16

## 2013-11-07 MED ORDER — VANCOMYCIN HCL IN DEXTROSE 1-5 GM/200ML-% IV SOLN
1000.0000 mg | Freq: Two times a day (BID) | INTRAVENOUS | Status: DC
  Administered 2013-11-08: 1000 mg via INTRAVENOUS
  Filled 2013-11-07 (×2): qty 200

## 2013-11-07 MED ORDER — ASPIRIN 81 MG PO CHEW
324.0000 mg | CHEWABLE_TABLET | ORAL | Status: AC
  Filled 2013-11-07: qty 4

## 2013-11-07 MED ORDER — ALBUTEROL SULFATE (2.5 MG/3ML) 0.083% IN NEBU: 2.5000 mg | INHALATION_SOLUTION | RESPIRATORY_TRACT | Status: DC | PRN

## 2013-11-07 MED ORDER — FENTANYL CITRATE 0.05 MG/ML IJ SOLN
100.0000 ug | INTRAMUSCULAR | Status: DC | PRN
  Administered 2013-11-07 (×2): 100 ug via INTRAVENOUS
  Filled 2013-11-07 (×2): qty 2

## 2013-11-07 MED ORDER — VITAMIN B-1 100 MG PO TABS
100.0000 mg | ORAL_TABLET | Freq: Every day | ORAL | Status: DC
  Administered 2013-11-07: 100 mg via ORAL
  Filled 2013-11-07: qty 1

## 2013-11-07 MED ORDER — SODIUM CHLORIDE 0.9 % IV SOLN
1500.0000 mg | Freq: Once | INTRAVENOUS | Status: AC
  Administered 2013-11-07: 1500 mg via INTRAVENOUS
  Filled 2013-11-07: qty 1500

## 2013-11-07 MED ORDER — SODIUM CHLORIDE 0.9 % IV SOLN
750.0000 mL | INTRAVENOUS | Status: DC | PRN
Start: 2013-11-07 — End: 2013-11-12
  Administered 2013-11-07 – 2013-11-08 (×4): 750 mL via INTRAVENOUS

## 2013-11-07 MED ORDER — LACTULOSE 10 GM/15ML PO SOLN
30.0000 g | Freq: Three times a day (TID) | ORAL | Status: DC
  Administered 2013-11-08 – 2013-11-10 (×8): 30 g
  Filled 2013-11-07 (×11): qty 45

## 2013-11-07 MED ORDER — SODIUM CHLORIDE 0.9 % IV SOLN: Freq: Once | INTRAVENOUS | Status: AC

## 2013-11-07 MED ORDER — FENTANYL CITRATE 0.05 MG/ML IJ SOLN: 50.0000 ug | Freq: Once | INTRAMUSCULAR | Status: DC

## 2013-11-07 MED ORDER — FENTANYL CITRATE 0.05 MG/ML IJ SOLN
INTRAMUSCULAR | Status: AC
  Administered 2013-11-07: 50 ug
  Filled 2013-11-07: qty 2

## 2013-11-07 MED ORDER — SODIUM CHLORIDE 0.9 % IV SOLN
Freq: Once | INTRAVENOUS | Status: AC
  Administered 2013-11-07: 07:00:00 via INTRAVENOUS

## 2013-11-07 MED ORDER — FOLIC ACID 1 MG PO TABS
1.0000 mg | ORAL_TABLET | Freq: Every day | ORAL | Status: DC
  Administered 2013-11-07: 1 mg via ORAL
  Filled 2013-11-07: qty 1

## 2013-11-07 MED ORDER — PANTOPRAZOLE SODIUM 40 MG IV SOLR
40.0000 mg | Freq: Two times a day (BID) | INTRAVENOUS | Status: DC
  Administered 2013-11-07 – 2013-11-12 (×11): 40 mg via INTRAVENOUS
  Filled 2013-11-07 (×13): qty 40

## 2013-11-07 MED ORDER — FENTANYL CITRATE 0.05 MG/ML IJ SOLN
25.0000 ug | INTRAMUSCULAR | Status: DC | PRN
  Administered 2013-11-07 (×2): 25 ug via INTRAVENOUS
  Filled 2013-11-07 (×2): qty 2

## 2013-11-07 MED ORDER — ROCURONIUM BROMIDE 50 MG/5ML IV SOLN: 50.0000 mg | Freq: Once | INTRAVENOUS | Status: DC

## 2013-11-07 MED ORDER — CETYLPYRIDINIUM CHLORIDE 0.05 % MT LIQD
7.0000 mL | Freq: Four times a day (QID) | OROMUCOSAL | Status: DC
  Administered 2013-11-07 – 2013-11-10 (×13): 7 mL via OROMUCOSAL

## 2013-11-07 MED ORDER — "THROMBI-PAD 3""X3"" EX PADS"
1.0000 | MEDICATED_PAD | Freq: Once | CUTANEOUS | Status: AC
  Administered 2013-11-07: 1 via TOPICAL
  Filled 2013-11-07: qty 1

## 2013-11-07 MED ORDER — VANCOMYCIN HCL IN DEXTROSE 1-5 GM/200ML-% IV SOLN
1000.0000 mg | Freq: Once | INTRAVENOUS | Status: AC
  Administered 2013-11-07: 1000 mg via INTRAVENOUS
  Filled 2013-11-07: qty 200

## 2013-11-07 MED ORDER — SODIUM CHLORIDE 0.9 % IV SOLN
0.0000 ug/h | INTRAVENOUS | Status: DC
  Administered 2013-11-07: 50 ug/h via INTRAVENOUS
  Administered 2013-11-09: 150 ug/h via INTRAVENOUS
  Administered 2013-11-09: 50 ug/h via INTRAVENOUS
  Filled 2013-11-07 (×5): qty 50

## 2013-11-07 MED ORDER — VASOPRESSIN 20 UNIT/ML IV SOLN
0.0300 [IU]/min | INTRAVENOUS | Status: DC
  Administered 2013-11-07 – 2013-11-09 (×2): 0.03 [IU]/min via INTRAVENOUS
  Filled 2013-11-07 (×4): qty 2

## 2013-11-07 MED ORDER — LACTULOSE 10 GM/15ML PO SOLN
30.0000 g | Freq: Three times a day (TID) | ORAL | Status: DC
  Administered 2013-11-07 (×2): 30 g via ORAL
  Filled 2013-11-07 (×5): qty 45

## 2013-11-07 MED ORDER — DEXTROSE 5 % IV SOLN
2.0000 g | Freq: Once | INTRAVENOUS | Status: AC
  Administered 2013-11-07: 2 g via INTRAVENOUS
  Filled 2013-11-07: qty 2

## 2013-11-07 MED ORDER — CETYLPYRIDINIUM CHLORIDE 0.05 % MT LIQD
7.0000 mL | Freq: Two times a day (BID) | OROMUCOSAL | Status: DC
  Administered 2013-11-07 (×2): 7 mL via OROMUCOSAL

## 2013-11-07 MED ORDER — SODIUM CHLORIDE 0.9 % IV BOLUS (SEPSIS)
1000.0000 mL | Freq: Once | INTRAVENOUS | Status: AC
  Administered 2013-11-07: 1000 mL via INTRAVENOUS

## 2013-11-07 MED ORDER — DEXTROSE 5 % IV SOLN
1.0000 g | Freq: Three times a day (TID) | INTRAVENOUS | Status: DC
  Administered 2013-11-07 – 2013-11-10 (×9): 1 g via INTRAVENOUS
  Filled 2013-11-07 (×11): qty 1

## 2013-11-07 MED ORDER — FOLIC ACID 1 MG PO TABS
1.0000 mg | ORAL_TABLET | Freq: Every day | ORAL | Status: DC
  Administered 2013-11-08 – 2013-11-12 (×5): 1 mg
  Filled 2013-11-07 (×5): qty 1

## 2013-11-07 MED ORDER — DEXTROSE 10 % IV SOLN
INTRAVENOUS | Status: DC
  Administered 2013-11-07 – 2013-11-08 (×3): via INTRAVENOUS

## 2013-11-07 MED ORDER — DEXTROSE 50 % IV SOLN
INTRAVENOUS | Status: AC
  Administered 2013-11-07: 50 mL
  Filled 2013-11-07: qty 50

## 2013-11-07 MED ORDER — ALBUMIN HUMAN 5 % IV SOLN
25.0000 g | Freq: Once | INTRAVENOUS | Status: AC
  Administered 2013-11-07: 25 g via INTRAVENOUS
  Filled 2013-11-07: qty 500

## 2013-11-07 MED ORDER — CHLORHEXIDINE GLUCONATE 0.12 % MT SOLN
15.0000 mL | Freq: Two times a day (BID) | OROMUCOSAL | Status: DC
  Administered 2013-11-07: 15 mL via OROMUCOSAL
  Filled 2013-11-07: qty 15

## 2013-11-07 MED ORDER — SODIUM CHLORIDE 0.9 % IV SOLN
80.0000 mg | Freq: Once | INTRAVENOUS | Status: AC
  Administered 2013-11-07: 80 mg via INTRAVENOUS
  Filled 2013-11-07: qty 80

## 2013-11-07 MED ORDER — FUROSEMIDE 10 MG/ML IJ SOLN
40.0000 mg | Freq: Two times a day (BID) | INTRAMUSCULAR | Status: DC
  Administered 2013-11-07: 40 mg via INTRAVENOUS
  Filled 2013-11-07: qty 4

## 2013-11-07 MED ORDER — ETOMIDATE 2 MG/ML IV SOLN: 20.0000 mg | Freq: Once | INTRAVENOUS | Status: DC

## 2013-11-07 MED ORDER — DEXTROSE 50 % IV SOLN
INTRAVENOUS | Status: AC
Start: 2013-11-07 — End: 2013-11-07
  Administered 2013-11-07: 18:00:00
  Filled 2013-11-07: qty 50

## 2013-11-07 MED ORDER — LIDOCAINE-EPINEPHRINE (PF) 2 %-1:200000 IJ SOLN
10.0000 mL | Freq: Once | INTRAMUSCULAR | Status: AC
  Administered 2013-11-07: 10 mL
  Filled 2013-11-07: qty 20

## 2013-11-07 MED ORDER — MIDAZOLAM HCL 2 MG/2ML IJ SOLN: 1.0000 mg | Freq: Once | INTRAMUSCULAR | Status: DC

## 2013-11-07 MED ORDER — TAMSULOSIN HCL 0.4 MG PO CAPS
0.4000 mg | ORAL_CAPSULE | Freq: Every day | ORAL | Status: DC
  Administered 2013-11-07 – 2013-11-11 (×5): 0.4 mg via ORAL
  Filled 2013-11-07 (×5): qty 1

## 2013-11-07 MED ORDER — CHLORHEXIDINE GLUCONATE 0.12 % MT SOLN
15.0000 mL | Freq: Two times a day (BID) | OROMUCOSAL | Status: DC
  Administered 2013-11-07 – 2013-11-10 (×6): 15 mL via OROMUCOSAL
  Filled 2013-11-07 (×5): qty 15

## 2013-11-07 MED ORDER — ASPIRIN 300 MG RE SUPP: 300.0000 mg | RECTAL | Status: AC

## 2013-11-07 MED ORDER — FENTANYL CITRATE 0.05 MG/ML IJ SOLN
50.0000 ug | Freq: Once | INTRAMUSCULAR | Status: AC
  Administered 2013-11-07: 50 ug via INTRAVENOUS

## 2013-11-07 MED ORDER — DEXTROSE 5 % IV SOLN
2.0000 ug/min | INTRAVENOUS | Status: DC
  Administered 2013-11-07: 30 ug/min via INTRAVENOUS
  Administered 2013-11-08: 35 ug/min via INTRAVENOUS
  Administered 2013-11-08: 50 ug/min via INTRAVENOUS
  Administered 2013-11-08: 30 ug/min via INTRAVENOUS
  Administered 2013-11-09: 20 ug/min via INTRAVENOUS
  Filled 2013-11-07 (×8): qty 16

## 2013-11-07 MED ORDER — VITAMIN B-1 100 MG PO TABS
100.0000 mg | ORAL_TABLET | Freq: Every day | ORAL | Status: DC
  Administered 2013-11-08 – 2013-11-12 (×5): 100 mg
  Filled 2013-11-07 (×5): qty 1

## 2013-11-07 MED ORDER — FENTANYL BOLUS VIA INFUSION
50.0000 ug | INTRAVENOUS | Status: DC | PRN
  Administered 2013-11-09: 50 ug via INTRAVENOUS
  Administered 2013-11-10 (×2): 100 ug via INTRAVENOUS
  Filled 2013-11-07: qty 100

## 2013-11-07 MED ORDER — SODIUM CHLORIDE 0.9 % IV SOLN
250.0000 mL | INTRAVENOUS | Status: DC | PRN
  Administered 2013-11-10: 250 mL via INTRAVENOUS

## 2013-11-07 MED ORDER — MIDAZOLAM HCL 2 MG/2ML IJ SOLN
INTRAMUSCULAR | Status: AC
  Administered 2013-11-07: 1 mg
  Filled 2013-11-07: qty 4

## 2013-11-07 MED ORDER — POTASSIUM CHLORIDE CRYS ER 20 MEQ PO TBCR
20.0000 meq | EXTENDED_RELEASE_TABLET | Freq: Every day | ORAL | Status: DC
  Administered 2013-11-07: 20 meq via ORAL
  Filled 2013-11-07: qty 1

## 2013-11-07 NOTE — Progress Notes (Signed)
eLink Physician-Brief Progress Note Patient Name: Xavier DuskyRichard L Schetter DOB: Feb 24, 1960 MRN: [id number]   Date of Service  11/20/2013  HPI/Events of Note  Increasing pressors CVP 10 Co-ox 63%, lactate rising  eICU Interventions  Add vaso Albumin 25g x 1     Intervention Category Major Interventions: Sepsis - evaluation and management  ALVA,RAKESH V. 11/28/2013, 5:17 PM

## 2013-11-07 NOTE — Procedures (Signed)
Central Venous Catheter Insertion Procedure Note Xavier DuskyRichard L Valentine [id number] 01-04-1960  Procedure: Insertion of Central Venous Catheter Indications: Assessment of intravascular volume, Drug and/or fluid administration and Frequent blood sampling  Procedure Details Consent: Risks of procedure as well as the alternatives and risks of each were explained to the (patient/caregiver).  Consent for procedure obtained. Time Out: Verified patient identification, verified procedure, site/side was marked, verified correct patient position, special equipment/implants available, medications/allergies/relevent history reviewed, required imaging and test results available.  Performed  Maximum sterile technique was used including antiseptics, cap, gloves, gown, hand hygiene, mask and sheet. Skin prep: Chlorhexidine; local anesthetic administered A antimicrobial bonded/coated triple lumen catheter was placed in the left internal jugular vein using the Seldinger technique.  Evaluation Blood flow good Complications: No apparent complications Patient did tolerate procedure well. Chest X-ray ordered to verify placement.  CXR: pending.  BABCOCK,PETE 11/29/2013, 3:58 PM

## 2013-11-07 NOTE — ED Notes (Signed)
Family at bedside. 

## 2013-11-07 NOTE — ED Notes (Signed)
Patient is confused and appropriate at times.  His sister said he has been confused lately.

## 2013-11-07 NOTE — ED Notes (Signed)
Patient was complaining of pain, MD advised no orders given due to patient's blood pressure being low

## 2013-11-07 NOTE — Procedures (Signed)
Intubation Procedure Note Xavier DuskyRichard L Valentine [id number] April 08, 1960  Procedure: Intubation Indications: Respiratory insufficiency  Procedure Details Consent: Risks of procedure as well as the alternatives and risks of each were explained to the (patient/caregiver).  Consent for procedure obtained. Time Out: Verified patient identification, verified procedure, site/side was marked, verified correct patient position, special equipment/implants available, medications/allergies/relevent history reviewed, required imaging and test results available.  Performed  Maximum sterile technique was used including antiseptics, cap, gloves, hand hygiene, mask and sheet.  MAC 4 glide    Evaluation Hemodynamic Status: BP stable throughout; O2 sats: stable throughout Patient's Current Condition: stable Complications: No apparent complications Patient did tolerate procedure well. Chest X-ray ordered to verify placement.  CXR: pending.   Xavier Valentine,Xavier Valentine 11/06/2013

## 2013-11-07 NOTE — Progress Notes (Signed)
[facility]  Patient Status: Active (receiving services up to time of hospitalization)  [facility] is providing the following services: RN  If patient discharges after hours, please call [phone].   Xavier Valentine 11/27/2013, 8:49 PM

## 2013-11-07 NOTE — Progress Notes (Signed)
ANTIBIOTIC CONSULT NOTE - INITIAL  Pharmacy Consult for Vancomycin, Cefepime Indication: Exudative Pleural Effusion  Allergies  Allergen Reactions  . Bee Venom Swelling    Patient Measurements: Height: 5\' 11"  (180.3 cm) Weight: 197 lb 8.5 oz (89.6 kg) IBW/kg (Calculated) : 75.3 Vital Signs: Temp: 100.4 F (38 C) (11/06 1227) Temp Source: Oral (11/06 1227) BP: 108/54 mmHg (11/06 1300) Pulse Rate: 140 (11/06 1300) Intake/Output from previous day:   Intake/Output from this shift: Total I/O In: 767 [I.V.:426; Blood:241; IV Piggyback:100] Out: 1300 [Urine:300; Other:1000]  Labs:  Recent Labs  11/05/13 1143 11/06/13 2354  WBC  --  13.0*  HGB  --  11.0*  PLT  --  126*  CREATININE 1.07 1.28   Estimated Creatinine Clearance: 69.4 mL/min (by C-G formula based on Cr of 1.28). No results for input(s): VANCOTROUGH, VANCOPEAK, VANCORANDOM, GENTTROUGH, GENTPEAK, GENTRANDOM, TOBRATROUGH, TOBRAPEAK, TOBRARND, AMIKACINPEAK, AMIKACINTROU, AMIKACIN in the last 72 hours.   Microbiology: Recent Results (from the past 720 hour(s))  Body fluid culture     Status: None   Collection Time: 10/12/13 11:54 AM  Result Value Ref Range Status   Specimen Description PLEURAL FLUID  Final   Special Requests NONE  Final   Gram Stain   Final    FEW WBC PRESENT, PREDOMINANTLY PMN NO ORGANISMS SEEN Performed at [facility]   Culture   Final    NO GROWTH 3 DAYS Performed at [facility]   Report Status 10/16/2013 FINAL  Final  MRSA PCR Screening     Status: None   Collection Time: 11/14/2013  6:50 AM  Result Value Ref Range Status   MRSA by PCR NEGATIVE NEGATIVE Final    Comment:        The GeneXpert MRSA Assay (FDA approved for NASAL specimens only), is one component of a comprehensive MRSA colonization surveillance program. It is not intended to diagnose MRSA infection nor to guide or monitor treatment for MRSA infections.     Medical History: Past Medical History   Diagnosis Date  . Cirrhosis 08/2013    ETOH and Hep C  . Ascites 09/2013  . Thrombocytopenia 09/2013  . Coagulopathy 09/2013    due to hepatic dysfunction.   . Pneumonia 09/2013  . Chronic alcoholism   . Hepatitis C dx'd ~ 2000  . Anxiety   . Inguinal hernia     repaired in [place] 08/2013    Medications:  Anti-infectives    Start     Dose/Rate Route Frequency Ordered Stop   11/13/2013 0330  vancomycin (VANCOCIN) IVPB 1000 mg/200 mL premix     1,000 mg200 mL/hr over 60 Minutes Intravenous  Once 11/06/2013 0316 11/06/2013 0534   11/18/2013 0330  cefTRIAXone (ROCEPHIN) 2 g in dextrose 5 % 50 mL IVPB     2 g100 mL/hr over 30 Minutes Intravenous  Once 11/05/2013 0316 11/15/2013 0424     Assessment: 53 YOM with cirrhosis and history of recurrent pleural effusions, tapped today and appears exudative sent for culture and to start vancomycin and cefepime per pharmacy consult.   WBC 13, LA trending up 1.48 >> 1.9, Tmax 101. SCr 1.28/est CrCl ~65-70 mL/min. Making urine.   Last vancomycin 11/6 1g x1 at 04:24AM. Last ceftriaxone dose 11/6 2g x1 at 03:36AM.   Goal of Therapy:  Vancomycin trough level 15-20 mcg/ml  Clinical resolution of infection  Plan:  1. Vancomycin 1500mg  now as not loaded prior, then 1g IV q12h.  2. Cefepime 1g IV q8h.  3. Monitor renal function and culture results.   Link SnufferJessica Emeril Stille, PharmD, BCPS Clinical Pharmacist [phone] 11/06/2013,2:55 PM

## 2013-11-07 NOTE — Progress Notes (Signed)
Hypoglycemic Event  CBG: Lo Critical  Treatment: D50 IV 50 mL, D10 drip at 50 mL  Symptoms: None  Follow-up CBG: Time: 1616 CBG Result: 99  Possible Reasons for Event: Unknown  Comments/MD notified: Marsa ArisPete Babcok NP    Everli Rother A  Remember to initiate Hypoglycemia Order Set & complete

## 2013-11-07 NOTE — Procedures (Signed)
Central Venous Catheter Insertion Procedure Note Xavier DuskyRichard L Valentine [id number] [birth year]  Procedure: Insertion of Central Venous Catheter Indications: Assessment of intravascular volume, Drug and/or fluid administration and Frequent blood sampling  Procedure Details Consent: Risks of procedure as well as the alternatives and risks of each were explained to the (patient/caregiver).  Consent for procedure obtained. Time Out: Verified patient identification, verified procedure, site/side was marked, verified correct patient position, special equipment/implants available, medications/allergies/relevent history reviewed, required imaging and test results available.  Performed  Maximum sterile technique was used including antiseptics, cap, gloves, gown, hand hygiene, mask and sheet. Skin prep: Chlorhexidine; local anesthetic administered A antimicrobial bonded/coated triple lumen catheter was placed in the right internal jugular vein using the Seldinger technique.  Evaluation Blood flow good Complications: No apparent complications Patient did tolerate procedure well. Chest X-ray ordered to verify placement.  CXR: pending.  U/S used in placement.  Xavier Valentine 11/18/2013, 3:48 PM

## 2013-11-07 NOTE — Progress Notes (Signed)
  Volume status and tissue perfusion was assessed via hemodynamic parameters of CVP and SCVO2. Lactate clearance will be assessed.        Anders SimmondsPete Valeree Leidy ACNP-BC [facility] Pulmonary/Critical Care Pager # [phone] OR # [phone] if no answer

## 2013-11-07 NOTE — Progress Notes (Signed)
UR Completed.  Araf Clugston Jane [phone] 11/30/2013  

## 2013-11-07 NOTE — ED Notes (Signed)
Theda SersLisa Pannell [phone] Sister of patient asked that we call her with any emergency info. She is patient's POA.

## 2013-11-07 NOTE — Progress Notes (Signed)
Hypoglycemic Event  CBG: Lo Critical  Treatment: D50 IV 50 mL  Symptoms: Shaky and Nervous/irritable  Follow-up CBG: Time:1445 CBG Result:70  Possible Reasons for Event: Unknown  Comments/MD notified: Marsa ArisPete Babcok  Per MD order 1 amp d50    Xavier Valentine A  Remember to initiate Hypoglycemia Order Set & complete

## 2013-11-07 NOTE — Progress Notes (Deleted)
PULMONARY / CRITICAL CARE MEDICINE   Name: Xavier Valentine MRN: [id number] DOB: [birth year]    ADMISSION DATE:  11/11/2013 CONSULTATION DATE:  11/19/2013  REFERRING MD :  EDP  CHIEF COMPLAINT:  AMS  INITIAL PRESENTATION: 53 year old male with PMH cirrhosis and recurrent pleural effusion presents with SOB and AMS 11/6. In ED noted to have recurrent large R effusion. PCCM asked to see for further eval and potential thoracentesis.  STUDIES:  Therapeutic thoracentesis 10/11, 10/12, 10/14, 11/6 Paracentesis 11/6 >>>  SIGNIFICANT EVENTS: 10/10 - 10/26 [facility] effusions and ascites.  10/22 - Denver shunt placement 11/6: marked respiratory distress and hypotension. 1.5L drained from thora   HISTORY OF PRESENT ILLNESS:  53 year old male with PMH as below, which includes cirrhosis (alcoholic and hepatitis c) with ascites and recurrent pleural effusion. He had been receiving therapeutic thoracenteses and paracenteses for several months. [facility]. He had a lengthy admission which resulted in a Denver drain being placed, after which he was doing well and was discharged. Of note, he had a palliative care eval during that he would be DNR. He was discharged 10/26. 11/5 he returned to ED complaining of SOB and family complained of AMS. They state that he had been taking his lactulose and other medications regularly. His rifaximin was recently stopped by GI. In ED he was initially hypotensive, which responded to fluids, and hypoxic. He was found to have recurrent R pleural effusion on CXR, larger than previous admission. PCCM has been asked to evaluate.   PAST MEDICAL HISTORY :   has a past medical history of Cirrhosis (08/2013); Ascites (09/2013); Thrombocytopenia (09/2013); Coagulopathy (09/2013); Pneumonia (09/2013); Chronic alcoholism; Hepatitis C (dx'd ~ 2000); Anxiety; and Inguinal hernia.  has past surgical history that includes Incision and  drainage perirectal abscess (1980's); Inguinal hernia repair (Right, 08/2013); and Esophagogastroduodenoscopy (N/A, 10/22/2013).   SUBJECTIVE:   VITAL SIGNS: Temp:  [98.4 F (36.9 C)-101 F (38.3 C)] 100.4 F (38 C) (11/06 1227) Pulse Rate:  [55-140] 140 (11/06 1300) Resp:  [14-51] 51 (11/06 1300) BP: (79-150)/(29-77) 108/54 mmHg (11/06 1300) SpO2:  [88 %-100 %] 100 % (11/06 1300) Weight:  [87.998 kg (194 lb)-89.6 kg (197 lb 8.5 oz)] 89.6 kg (197 lb 8.5 oz) (11/06 0627) HEMODYNAMICS:   VENTILATOR SETTINGS:   INTAKE / OUTPUT:  Intake/Output Summary (Last 24 hours) at 07/26/13 1430 Last data filed at 07/26/13 1305  Gross per 24 hour  Intake    767 ml  Output   1300 ml  Net   -533 ml    PHYSICAL EXAMINATION: General:  Cachectic male in acute distress Neuro:  Alert, oriented but confused. Follows commands. Non-focal HEENT:  [place]/AT, PERRL, sclarea jaundiced.  Cardiovascular: SVT Lungs:  Mildly tachypneic, increased effort. Diminished breath sounds on R side.  Abdomen: Distended, fluctuant with ascites.  Musculoskeletal:  No acute deformity Skin:  Jaundiced.  LABS:  CBC  Recent Labs Lab 11/06/13 2354  WBC 13.0*  HGB 11.0*  HCT 31.5*  PLT 126*   Coag's  Recent Labs Lab 07/26/13 0157 07/26/13 0425  APTT 48*  --   INR  --  2.48*   BMET  Recent Labs Lab 11/05/13 1143 11/06/13 2354  NA 131* 129*  K 3.6 4.6  CL 95* 95*  CO2 26 23  BUN 24* 28*  CREATININE 1.07 1.28  GLUCOSE 111* 128*   Electrolytes  Recent Labs Lab 11/05/13 1143 11/06/13 2354  11/25/2013 0500  CALCIUM 7.1* 7.5*  --   MG  --   --  1.7  PHOS  --   --  4.4   Sepsis Markers  Recent Labs Lab 11/28/2013 0301 11/20/2013 0437 11/25/2013 0736  LATICACIDVEN 2.06 1.48 1.9   ABG  Recent Labs Lab 12/01/2013 1343  PHART 7.465*  PCO2ART 22.9*  PO2ART 54.0*   Liver Enzymes  Recent Labs Lab 11/05/13 1143 11/06/13 2354  AST 42* 42*  ALT 22 21  ALKPHOS 80 89  BILITOT 3.5* 3.7*   ALBUMIN 1.9* 1.6*   Cardiac Enzymes  Recent Labs Lab 11/06/13 2354 11/04/2013 0157  TROPONINI  --  <0.30  PROBNP 170.7*  --    Glucose  Recent Labs Lab 11/11/2013 0635 11/11/2013 0817 11/25/2013 1206  GLUCAP 103* 115* 77    Imaging No results found.   ASSESSMENT / PLAN:  PULMONARY A: Acute hypoxic respiratory failure 2nd to effusion Large R pleural effusion Acute on chronic Respiratory failure  Respiratory alkalosis related to liver failure vs sepsis Denver drain in place- not functioning?  P:   Intubate Place line, monitor CVP goal  He would accept short term intubation but does not want CPR / ACLS or any long term extraordinary support Therapeutic thoracentesis done with 1.5 liters drains Supplemental O2 as needed to maintain SpO2 greater than 92% F/u CXR, ABG   CARDIOVASCULAR A:  Hypotension SVT  P:  Gentle IVF hydration Tele  RENAL A:   Hyponatremia chronic Lactic acidosis (improved with IVF) BPH  P:   Follow Bmet Trend lactic acid Continue tamsulosin  GASTROINTESTINAL A:  ETOH/hepC cirrhosis Ascites MELD score: 18  P:   Diuresis with lasix, as BP/ renal function/ Na supports Spironolactone was held last admission, will likely need to be restarted 11/6 Lactulose  NPO; place OG- will need tube feeds initiated if remains intubated  HEMATOLOGIC A:   Coagulopathy r/t cirrhosis Thrombocytopenia  P:  cbc Follow Coags SCDs  INFECTIOUS A:   Low grade fever Severe Sepsis  P:   Monitor clinically  ENDOCRINE A:   No acute issues Hypoglycemia- likely related to sepsis  P:   D10 gtt   NEUROLOGIC A:   Hepatic encephalopathy GSC 14 for confusion Pain Management  P:   RASS goal: -1 to -2 Lactulose  Consider adding back rifaximin (recently stopped by GI) Thiamine, folate PRN fentanyl for pain management Monitor   FAMILY  - Updates:   - Inter-disciplinary family meet or Palliative Care meeting due by:   11/13

## 2013-11-07 NOTE — Progress Notes (Signed)
   Re reound by CCM   - resp disterss  - CXr - worsened large rt effusion  - Current CHild Pugh Score - 14  Plan Immediate thora for dx and thereapeutic conside TIPS   Dr. Kalman ShanMurali Sevastian Witczak, M.D., [facility].C.P Pulmonary and Critical Care Medicine Staff Physician [facility] [place] [facility]monary and Critical Care Pager: [phone], If no answer or between  15:00h - 7:00h: call [phone]  11/13/2013 12:49 PM

## 2013-11-07 NOTE — Progress Notes (Signed)
Referring Physician(s): * No referring provider recorded for this case *  Subjective:  53yo male with HCV/ETOH cirrhosis presents with acute SOB and confusion. Admit to ICU. Pt is SP Denver Shunt with IR 10/23/13 for recurrent Hepatic Hydrothorax/Ascites.  Current concern is for potential sepsis.  Also considered on DDx: encephalopathy, respiratory failure  Allergies: Bee venom  Medications: Prior to Admission medications   Medication Sig Start Date End Date Taking? Authorizing Provider  albuterol (PROVENTIL HFA;VENTOLIN HFA) 108 (90 BASE) MCG/ACT inhaler Inhale 2 puffs into the lungs daily as needed for wheezing or shortness of breath. 11/05/13  Yes [name], MD  clonazePAM (KLONOPIN) 0.5 MG tablet Take 1 tablet (0.5 mg total) by mouth daily as needed for anxiety. 11/05/13  Yes [name], MD  docusate sodium (COLACE) 100 MG capsule Take 1 capsule (100 mg total) by mouth 2 (two) times daily as needed for mild constipation. Hold if you have diarrhea. 11/05/13  Yes [name], MD  fentaNYL (DURAGESIC - DOSED MCG/HR) 25 MCG/HR patch Place 1 patch (25 mcg total) onto the skin every 3 (three) days. 11/05/13  Yes [name], MD  folic acid (FOLVITE) 1 MG tablet Take 1 tablet (1 mg total) by mouth daily. 11/05/13  Yes [name], MD  furosemide (LASIX) 40 MG tablet Take 1 tablet (40 mg total) by mouth daily. 11/05/13  Yes [name], MD  lactulose (CHRONULAC) 10 GM/15ML solution Take 45 mLs (30 g total) by mouth daily. 11/05/13  Yes [name], MD  Multiple Vitamin (MULTIVITAMIN WITH MINERALS) TABS tablet Take 1 tablet by mouth daily. 09/09/13  Yes Clydia Llano, MD  omeprazole (PRILOSEC) 40 MG capsule Take 1 capsule (40 mg total) by mouth daily. 11/05/13  Yes [name], MD  oxyCODONE (ROXICODONE) 5 MG immediate release tablet Take 1 tablet (5 mg total) by mouth every 4 (four) hours as needed for severe pain. 10/27/13  Yes Osvaldo Shipper, MD  polyethylene glycol (MIRALAX /  GLYCOLAX) packet Take 17 g by mouth daily as needed for moderate constipation. 11/05/13  Yes [name], MD  potassium chloride SA (K-DUR,KLOR-CON) 20 MEQ tablet Take 1 tablet (20 mEq total) by mouth daily. 11/05/13  Yes [name], MD  saccharomyces boulardii (FLORASTOR) 250 MG capsule Take 1 capsule (250 mg total) by mouth 2 (two) times daily. For 5 days 11/05/13  Yes [name], MD  tamsulosin (FLOMAX) 0.4 MG CAPS capsule Take 1 capsule (0.4 mg total) by mouth daily. 11/05/13  Yes [name], MD  thiamine 100 MG tablet Take 1 tablet (100 mg total) by mouth daily. 09/09/13  Yes Clydia Llano, MD  amoxicillin (AMOXIL) 500 MG capsule Take 1 capsule (500 mg total) by mouth every 8 (eight) hours. For 5 more days 10/27/13   Osvaldo Shipper, MD    Review of Systems  Vital Signs: BP 88/41 mmHg  Pulse 133  Temp(Src) 100.4 F (38 C) (Oral)  Resp 18  Ht 5\' 11"  (1.803 m)  Wt 197 lb 8.5 oz (89.6 kg)  BMI 27.56 kg/m2  SpO2 100%  Physical Exam  Imaging: Dg Chest 2 View  2013-11-28   CLINICAL DATA:  Shortness of breath.  History of liver failure.  EXAM: CHEST  2 VIEW  COMPARISON:  10/16/2013  FINDINGS: Large right pleural effusion with minimal aeration of the right upper lung, increasing since previous study. Consolidation in the right lung. Left lung is clear. Heart size is obscured by the right pleural effusion but appears normal. No pulmonary vascular  congestion. Small left pleural effusion. Shunt tubing demonstrated in the right chest.  IMPRESSION: Large and increasing right pleural effusion. Small left pleural effusion.   Electronically Signed   By: Burman NievesWilliam  Stevens M.D.   On: 11/13/2013 00:16   Dg Chest Port 1 View  11/09/2013   CLINICAL DATA:  Difficulty breathing. Status post right-sided thoracentesis  EXAM: PORTABLE CHEST - 1 VIEW  COMPARISON:  November 07, 2013  FINDINGS: Right effusion is considerably smaller status post thoracentesis. There is consolidation throughout much of the right  mid and lower lung zones with some layering effusion remaining on the right. The left lung is clear. Heart size and pulmonary vascularity are normal. Central catheter tip is at cavoatrial junction. No pneumothorax. No adenopathy.  IMPRESSION: No pneumothorax. Moderate right effusion remains, although effusion is considerably smaller. There remains consolidation throughout much of the right lung, although there has been improved aeration on the right middle and lower lobes following thoracentesis procedure. Left lung remains clear.   Electronically Signed   By: Bretta BangWilliam  Woodruff M.D.   On: 11/16/2013 13:04    Labs:  CBC:  Recent Labs  10/24/13 0337  10/25/13 0502  10/26/13 0500 10/26/13 1147 10/27/13 0310 11/06/13 2354  WBC 7.7  --  6.7  --   --   --  5.8 13.0*  HGB 9.1*  --  10.0*  --   --   --  9.0* 11.0*  HCT 25.1*  --  28.4*  --   --   --  25.1* 31.5*  PLT 92*  88*  < > 81*  86*  < > 77* 87* 81*  77* 126*  < > = values in this interval not displayed.  COAGS:  Recent Labs  10/26/13 0500 10/26/13 1147 10/27/13 0310 12/01/2013 0157 11/09/2013 0425  INR 3.32* 2.89* 3.18*  --  2.48*  APTT 54* 48* 47* 48*  --     BMP:  Recent Labs  10/26/13 0500 10/27/13 0310 11/05/13 1143 11/06/13 2354  NA 124* 128* 131* 129*  K 3.0* 3.1* 3.6 4.6  CL 86* 89* 95* 95*  CO2 31 29 26 23   GLUCOSE 122* 95 111* 128*  BUN 55* 48* 24* 28*  CALCIUM 7.3* 7.2* 7.1* 7.5*  CREATININE 1.50* 1.24 1.07 1.28  GFRNONAA 51* 64* 78 61*  GFRAA 59* 74* >89 71*    LIVER FUNCTION TESTS:  Recent Labs  10/20/13 0415 10/23/13 0519 10/24/13 0824 11/05/13 1143 11/06/13 2354 11/05/2013 1325  BILITOT 3.4* 2.3*  --  3.5* 3.7*  --   AST 62* 58*  --  42* 42*  --   ALT 41 39  --  22 21  --   ALKPHOS 95 110  --  80 89  --   PROT 6.9 6.5  --  6.3 6.5 5.2*  ALBUMIN 1.8* 1.7*  1.6* 1.8* 1.9* 1.6*  --     Assessment and Plan:  53yo male with respiratory decompensation, possible bacteremia/sepsis.   MELD  score = 24.   Patient is not a candidate for elective TIPS. (30-day mortality in this sub-set of patients is ~ 50%).  Appreciate ICU input and care.    Discussed Fenton FoyChilds C prognosis with the family (1 year survival = 45%, 2 year survival = 35%), and the fact that patient is not a candidate for TIPS.   Agree with current plan of care.    Please have nursing pump Denver Shunt every 6 hours (10x's Q6hr) to assure that the  shunt does not occlude.        Signed:  Yvone NeuJaime S. Loreta AveWagner, DO    Mikaele Stecher S 11/17/2013, 3:57 PM

## 2013-11-07 NOTE — Progress Notes (Signed)
Asked to evaluate patient's mental status and respiratory status.  Clearly deteriorating.  Spoke with family.  After discussion, decision was made to make patient a LCB, proceed with short intubation only and treat infection.  Evidently patient was DNR.  After discussion with family, will make patient no CPR/cardioversion and short term intubation only.  Spoke to IR, will proceed with TIPS procedure.  Additional CC time of 45 min.  Alyson ReedyWesam G. Shawnee Gambone, M.D. [facility] Pulmonary/Critical [facility] Medicine. Pager: [phone]. After hours pager: [phone].

## 2013-11-07 NOTE — Progress Notes (Signed)
PULMONARY / CRITICAL CARE MEDICINE   Name: Xavier Valentine MRN: [id number] DOB: 1961/01/01    ADMISSION DATE:  11/11/2013 CONSULTATION DATE:  11/26/2013  REFERRING MD :  EDP  CHIEF COMPLAINT:  AMS  INITIAL PRESENTATION: 53 year old male with PMH cirrhosis and recurrent pleural effusion presents with SOB and AMS 11/6. In ED noted to have recurrent large R effusion. PCCM asked to see for further eval and potential thoracentesis.  STUDIES:  Therapeutic thoracentesis 10/11, 10/12, 10/14, 11/6 Paracentesis 11/6 >>>  SIGNIFICANT EVENTS: 10/10 - 10/26 [facility] and ascites.  10/22 - [place] shunt placement 11/6: marked respiratory distress and hypotension. 1.5L drained from thora 11/6: intubated. Shock>>>got fluids and started on pressors SUBJECTIVE: Progressive resp distress.  VITAL SIGNS: Temp:  [98.4 F (36.9 C)-101 F (38.3 C)] 100.4 F (38 C) (11/06 1227) Pulse Rate:  [55-140] 133 (11/06 1501) Resp:  [14-51] 18 (11/06 1501) BP: (79-150)/(29-77) 88/41 mmHg (11/06 1501) SpO2:  [88 %-100 %] 100 % (11/06 1501) FiO2 (%):  [100 %] 100 % (11/06 1501) Weight:  [87.998 kg (194 lb)-89.6 kg (197 lb 8.5 oz)] 89.6 kg (197 lb 8.5 oz) (11/06 0627) HEMODYNAMICS:   VENTILATOR SETTINGS: Vent Mode:  [-] PRVC FiO2 (%):  [100 %] 100 % Set Rate:  [18 bmp] 18 bmp Vt Set:  [600 mL] 600 mL PEEP:  [5 cmH20] 5 cmH20 Plateau Pressure:  [17 cmH20] 17 cmH20 INTAKE / OUTPUT:  Intake/Output Summary (Last 24 hours) at 06/12/13 1551 Last data filed at 06/12/13 1305  Gross per 24 hour  Intake    767 ml  Output   1300 ml  Net   -533 ml    PHYSICAL EXAMINATION: General:  Cachectic male in acute distress prior to intubation  Neuro:  Alert, oriented but confused. Follows commands. Non-focal prior to intubation  HEENT:  [place]/AT, PERRL, sclarea jaundiced.  Cardiovascular: SVT Lungs:  Mildly tachypneic, increased effort. Diminished breath sounds on R side.  Abdomen:  Distended, fluctuant with ascites.  Musculoskeletal:  No acute deformity Skin:  Jaundiced.  LABS:  CBC  Recent Labs Lab 11/06/13 2354  WBC 13.0*  HGB 11.0*  HCT 31.5*  PLT 126*   Coag's  Recent Labs Lab 06/12/13 0157 06/12/13 0425  APTT 48*  --   INR  --  2.48*   BMET  Recent Labs Lab 11/05/13 1143 11/06/13 2354  NA 131* 129*  K 3.6 4.6  CL 95* 95*  CO2 26 23  BUN 24* 28*  CREATININE 1.07 1.28  GLUCOSE 111* 128*   Electrolytes  Recent Labs Lab 11/05/13 1143 11/06/13 2354 06/12/13 0500  CALCIUM 7.1* 7.5*  --   MG  --   --  1.7  PHOS  --   --  4.4   Sepsis Markers  Recent Labs Lab 06/12/13 0437 06/12/13 0736 06/12/13 1338  LATICACIDVEN 1.48 1.9 7.6*   ABG  Recent Labs Lab 06/12/13 1343  PHART 7.465*  PCO2ART 22.9*  PO2ART 54.0*   Liver Enzymes  Recent Labs Lab 11/05/13 1143 11/06/13 2354  AST 42* 42*  ALT 22 21  ALKPHOS 80 89  BILITOT 3.5* 3.7*  ALBUMIN 1.9* 1.6*   Cardiac Enzymes  Recent Labs Lab 11/06/13 2354 06/12/13 0157  TROPONINI  --  <0.30  PROBNP 170.7*  --    Glucose  Recent Labs Lab 06/12/13 0635 06/12/13 0817 06/12/13 1206  GLUCAP 103* 115* 77    Imaging No results found.   ASSESSMENT /  PLAN:  PULMONARY A: Acute hypoxic respiratory failure 2nd to effusion Large R pleural effusion Acute on chronic Respiratory failure  Respiratory alkalosis related to liver failure vs sepsis Denver drain in place- not functioning?  P:   Intubate He would accept short term intubation but does not want CPR / ACLS or any long term extraordinary support Therapeutic thoracentesis done with 1.5 liters drains F/u CXR, ABG   CARDIOVASCULAR A:  SIRS/sepsis  Probable septic shock   P:  Gentle IVF hydration Central access CVP goal 8-12 MAP goal >65   RENAL A:   Hyponatremia chronic Lactic acidosis (improved with IVF) BPH  P:   Follow Bmet Trend lactic acid Continue  tamsulosin  GASTROINTESTINAL A:  ETOH/hepC cirrhosis Ascites MELD score: 18  P:   Diuresis with lasix, as BP/ renal function/ Na supports Spironolactone was held last admission, will likely need to be restarted 11/6 Lactulose  NPO; place OG- will need tube feeds initiated if remains intubated IR says not a TIPS candidate    HEMATOLOGIC  A:   Coagulopathy r/t cirrhosis Thrombocytopenia  P:  cbc Follow Coags SCDs  INFECTIOUS A:   Low grade fever Severe Sepsis ? Source: lungs, pleural space? Abd?   P:   Sputum 11/6>>> Pleural fluid 11/6>>> vanc 11/6>>> Cefepime 11/6>>>  ENDOCRINE A:   No acute issues Hypoglycemia- likely related to sepsis  P:   D10 gtt  Trend cbg q 1 hr   NEUROLOGIC A:   Hepatic +/- septic encephalopathy GSC 14 for confusion Pain Management  P:   RASS goal: -1 to -2 Lactulose  Consider adding back rifaximin (recently stopped by GI) Thiamine, folate PRN fentanyl for pain management Monitor   FAMILY  - Updated at bedside   - Inter-disciplinary family meet or Palliative Care meeting due by:  11/13

## 2013-11-07 NOTE — Procedures (Signed)
Intubation Procedure Note Xavier Valentine [id number] 11-Feb-1960  Procedure: Intubation Indications: Respiratory insufficiency  Procedure Details Consent: Risks of procedure as well as the alternatives and risks of each were explained to the (patient/caregiver).  Consent for procedure obtained. Time Out: Verified patient identification, verified procedure, site/side was marked, verified correct patient position, special equipment/implants available, medications/allergies/relevent history reviewed, required imaging and test results available.  Performed  Maximum sterile technique was used including gloves, hand hygiene and mask.  MAC    Evaluation Hemodynamic Status: BP stable throughout; O2 sats: stable throughout Patient's Current Condition: stable Complications: No apparent complications Patient did tolerate procedure well. Chest X-ray ordered to verify placement.  CXR: pending.   Otto Felkins 11/02/2013

## 2013-11-07 NOTE — H&P (Signed)
PULMONARY / CRITICAL CARE MEDICINE   Name: Xavier Valentine MRN: [id number] DOB: 1960-02-05    ADMISSION DATE:  06-Dec-2013 CONSULTATION DATE:  December 06, 2013  REFERRING MD :  EDP  CHIEF COMPLAINT:  AMS  INITIAL PRESENTATION: 53 year old male with PMH cirrhosis and recurrent pleural effusion presents with SOB and AMS 11/6. In ED noted to have recurrent large R effusion. PCCM asked to see for further eval and potential thoracentesis.  STUDIES:  Therapeutic thoracentesis 10/11, 10/12, 10/14  Paracentesis 11/6 >>>  SIGNIFICANT EVENTS: 10/10 - 10/26 Admission to [facility] for recurrent effusions and ascites.  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 He had been receiving therapeutic thoracenteses and paracenteses for several months. He was recently admitted to [facility] for this condition. He had a lengthy admission which resulted in a [place] drain being placed, after which he was doing well and was discharged. Of note, he had a palliative care eval during that he would be DNR. He was discharged 10/26. 11/5 he returned to ED complaining of SOB and family complained of AMS. They state that he had been taking his lactulose and other medications regularly. His rifaximin was recently stopped by GI. In ED he was initially hypotensive, which responded to fluids, and hypoxic. He was found to have recurrent R pleural effusion on CXR, larger than previous admission. PCCM has been asked to evaluate.   PAST MEDICAL HISTORY :   has a past medical history of Cirrhosis (08/2013); Ascites (09/2013); Thrombocytopenia (09/2013); Coagulopathy (09/2013); Pneumonia (09/2013); Chronic alcoholism; Hepatitis C (dx'd ~ 2000); Anxiety; and Inguinal hernia.  has past surgical history that includes Incision and drainage perirectal abscess (1980's); Inguinal hernia repair (Right, 08/2013); and  Esophagogastroduodenoscopy (N/A, 10/22/2013). Prior to Admission medications   Medication Sig Start Date End Date Taking? Authorizing Provider  albuterol (PROVENTIL HFA;VENTOLIN HFA) 108 (90 BASE) MCG/ACT inhaler Inhale 2 puffs into the lungs daily as needed for wheezing or shortness of breath. 11/05/13  Yes [name], MD  clonazePAM (KLONOPIN) 0.5 MG tablet Take 1 tablet (0.5 mg total) by mouth daily as needed for anxiety. 11/05/13  Yes [name], MD  docusate sodium (COLACE) 100 MG capsule Take 1 capsule (100 mg total) by mouth 2 (two) times daily as needed for mild constipation. Hold if you have diarrhea. 11/05/13  Yes [name], MD  fentaNYL (DURAGESIC - DOSED MCG/HR) 25 MCG/HR patch Place 1 patch (25 mcg total) onto the skin every 3 (three) days. 11/05/13  Yes [name], MD  folic acid (FOLVITE) 1 MG tablet Take 1 tablet (1 mg total) by mouth daily. 11/05/13  Yes [name], MD  furosemide (LASIX) 40 MG tablet Take 1 tablet (40 mg total) by mouth daily. 11/05/13  Yes [name], MD  lactulose (CHRONULAC) 10 GM/15ML solution Take 45 mLs (30 g total) by mouth daily. 11/05/13  Yes [name], MD  Multiple Vitamin (MULTIVITAMIN WITH MINERALS) TABS tablet Take 1 tablet by mouth daily. 09/09/13  Yes Clydia Llano, MD  omeprazole (PRILOSEC) 40 MG capsule Take 1 capsule (40 mg total) by mouth daily. 11/05/13  Yes [name], MD  oxyCODONE (ROXICODONE) 5 MG immediate release tablet Take 1 tablet (5 mg total) by mouth every 4 (four) hours as needed for severe pain. 10/27/13  Yes Osvaldo Shipper, MD  polyethylene glycol (MIRALAX / GLYCOLAX) packet Take 17 g  by mouth daily as needed for moderate constipation. 11/05/13  Yes Doris Cheadleeepak Advani, MD  potassium chloride SA (K-DUR,KLOR-CON) 20 MEQ tablet Take 1 tablet (20 mEq total) by mouth daily. 11/05/13  Yes Doris Cheadleeepak Advani, MD  saccharomyces boulardii (FLORASTOR) 250 MG capsule Take 1 capsule (250 mg total) by mouth 2 (two) times daily. For 5 days  11/05/13  Yes Doris Cheadleeepak Advani, MD  tamsulosin (FLOMAX) 0.4 MG CAPS capsule Take 1 capsule (0.4 mg total) by mouth daily. 11/05/13  Yes Doris Cheadleeepak Advani, MD  thiamine 100 MG tablet Take 1 tablet (100 mg total) by mouth daily. 09/09/13  Yes Clydia LlanoMutaz Elmahi, MD  amoxicillin (AMOXIL) 500 MG capsule Take 1 capsule (500 mg total) by mouth every 8 (eight) hours. For 5 more days 10/27/13   Osvaldo ShipperGokul Krishnan, MD   Allergies  Allergen Reactions  . Bee Venom Swelling    FAMILY HISTORY:  indicated that his mother is alive. He indicated that his father is deceased. He indicated that both of his sisters are alive.  SOCIAL HISTORY:  reports that he has quit smoking. His smoking use included Cigarettes. He smoked 0.00 packs per day for 10 years. He has never used smokeless tobacco. He reports that he drinks alcohol. He reports that he uses illicit drugs (Marijuana and Cocaine).  REVIEW OF SYSTEMS:  Limited by encephalopathy  Bolds are positive  Constitutional: weight loss, gain, night sweats, Fevers, chills, fatigue .  HEENT: headaches, Sore throat, sneezing, nasal congestion, post nasal drip, Difficulty swallowing, Tooth/dental problems, visual complaints visual changes, ear ache CV:  chest pain, radiates: ,Orthopnea, PND, swelling in lower extremities, dizziness, palpitations, syncope.  GI  heartburn, indigestion, abdominal pain, nausea, vomiting, diarrhea, change in bowel habits, loss of appetite, bloody stools.  Resp: cough, productive: , hemoptysis, dyspnea, chest pain, pleuritic.  Skin: rash or itching or icterus GU: dysuria, change in color of urine, urgency or frequency. flank pain, hematuria  MS: joint pain or swelling. decreased range of motion  Psych: change in mood or affect. depression or anxiety.  Neuro: difficulty with speech, weakness, numbness, ataxia    SUBJECTIVE:   VITAL SIGNS: Temp:  [101 F (38.3 C)] 101 F (38.3 C) (11/05 2354) Pulse Rate:  [55-109] 55 (11/06 0329) Resp:  [14-33] 16  (11/06 0352) BP: (79-125)/(29-63) 125/55 mmHg (11/06 0329) SpO2:  [88 %-95 %] 93 % (11/06 0329) Weight:  [87.998 kg (194 lb)] 87.998 kg (194 lb) (11/05 2354) HEMODYNAMICS:   VENTILATOR SETTINGS:   INTAKE / OUTPUT: No intake or output data in the 24 hours ending 11/27/2013 0418  PHYSICAL EXAMINATION: General:  Cachectic male in mild distress Neuro:  Alert, oriented but confused. Follows commands. Non-focal HEENT:  [place]/AT, PERRL, sclarea jaundiced.  Cardiovascular: Borderline tachy, regular Lungs:  Mildly tachypneic, increased effort. Diminished breath sounds on R side.  Abdomen: Distended, fluctuant with ascites.  Musculoskeletal:  No acute deformity Skin:  Jaundiced.  LABS:  CBC  Recent Labs Lab 11/06/13 2354  WBC 13.0*  HGB 11.0*  HCT 31.5*  PLT 126*   Coag's  Recent Labs Lab 11/26/2013 0157  APTT 48*   BMET  Recent Labs Lab 11/05/13 1143 11/06/13 2354  NA 131* 129*  K 3.6 4.6  CL 95* 95*  CO2 26 23  BUN 24* 28*  CREATININE 1.07 1.28  GLUCOSE 111* 128*   Electrolytes  Recent Labs Lab 11/05/13 1143 11/06/13 2354  CALCIUM 7.1* 7.5*   Sepsis Markers  Recent Labs Lab 11/25/2013 0301  LATICACIDVEN 2.06   ABG  No results for input(s): PHART, PCO2ART, PO2ART in the last 168 hours. Liver Enzymes  Recent Labs Lab 11/05/13 1143 11/06/13 2354  AST 42* 42*  ALT 22 21  ALKPHOS 80 89  BILITOT 3.5* 3.7*  ALBUMIN 1.9* 1.6*   Cardiac Enzymes  Recent Labs Lab 11/06/13 2354 11/06/2013 0157  TROPONINI  --  <0.30  PROBNP 170.7*  --    Glucose No results for input(s): GLUCAP in the last 168 hours.  Imaging No results found.   ASSESSMENT / PLAN:  PULMONARY A: Acute hypoxic respiratory failure 2nd to effusion Large R pleural effusion  P:   Supplemental O2 as needed to maintain SpO2 greater than 92% May need BiPAP, hopefully will be able to defer. He would accept short term intubation but does not want CPR / ACLS or any long term extraordinary  support Will attempt to use [place] drain to clear effusion, ? Whether they are using it at home or whether it is functioning properly May require therapeutic thoracentesis even with the shunt. He needs at least a diagnostic thora to assess for infxn F/u CXR, ABG Continue diuresis  CARDIOVASCULAR A:  Hypotension, improved with IVF  P:  Gentle IVF hydration Tele  RENAL A:   Hyponatremia chronic Lactic acidosis (improved with IVF) BPH  P:   Follow Bmet Trend lactic acid Continue tamsulosin  GASTROINTESTINAL A:  ETOH/hepC cirrhosis Ascites MELD score: 18  P:   Diuresis with lasix, as BP/ renal function/ Na supports Spironolactone was held last admission, will likely need to be restarted 11/6 Lactulose   HEMATOLOGIC A:   Coagulopathy r/t cirrhosis Thrombocytopenia  P:  Will consider transfusing one unit FFP if needs thora Follow Coags SCDs  INFECTIOUS A:   Low suspicion for SBP  P:   Monitor clinically  ENDOCRINE A:   No acute issues  P:   Monitor glucose on Bmet  NEUROLOGIC A:   Hepatic encephalopathy Pain Managment  P:   RASS goal: 0 Lactulose  Consider adding back rifaximin (recently stopped by GI) Thiamine, folate PRN fentanyl for pain management Monitor   FAMILY  - Updates:   - Inter-disciplinary family meet or Palliative Care meeting due by:  11/13   Joneen RoachPaul Hoffman, ACNP [facility] Pager [phone] or [phone]  Attending Note I have examined patient, reviewed all notes, labs, studies. I have discussed the case with P. Mikey BussingHoffman, NP, and agree with the assessment and plans as amended above. Both his chronic R effusion and his hepatic encephalopathy are decompensated. No clear evidence for SBP or empyema although there is risk for these. He will need at least diagnostic thora. Code status clarified as above - Limited code, only short term intubation to get over an acute event is acceptable. Independent CC  time 45 minutes.   Levy Pupaobert Lynda Wanninger, MD, PhD 11/23/2013, 5:47 AM [facility] [phone] or if no answer [phone]

## 2013-11-07 NOTE — Progress Notes (Signed)
Hypoglycemic Event  CBG: 50  Treatment: D50 IV 50 mL  Symptoms: None  Follow-up CBG: Time: 1734 CBG Result: 129  Possible Reasons for Event: Unknown  Comments/MD notified: Dr. Barrett HenleAlva    Xavier Valentine A  Remember to initiate Hypoglycemia Order Set & complete

## 2013-11-07 NOTE — Procedures (Signed)
Thoracentesis Procedure Note  Pre-operative Diagnosis: right effusion   Post-operative Diagnosis: same  Indications: acute resp distress   Procedure Details  Consent: Informed consent was obtained. Risks of the procedure were discussed including: infection, bleeding, pain, pneumothorax.  Under sterile conditions the patient was positioned. Betadine solution and sterile drapes were utilized.  1% buffered lidocaine was used to anesthetize the pleural space r space which was identified by real time US. Fluid was obtained without any difficulties and minimal blood loss.  A dressing was applied to the wound and wound care instructions were provided.   Findings 1800 ml of blood tinged pleural fluid was obtained. A sample was sent to Pathology for cytogenetics, flow, and cell counts, as well as for infection analysis.  Complications:  None; patient tolerated the procedure well.          Condition: unstable  Plan A follow up chest x-ray was ordered. Bed Rest for 0 hours.

## 2013-11-07 NOTE — ED Provider Notes (Signed)
CSN: [id number]     Arrival date & time 11/06/13  2341 History   First MD Initiated Contact with Patient 01/15/13 0031     No chief complaint on file.    (Consider location/radiation/quality/duration/timing/severity/associated sxs/prior Treatment) HPI Comments: Pt comes in with cc of confusion, shortness of breath. Hx of ESLD with recent [place] shunt placement. Pt reprots that things were going well post discharge, however, on Wednesday, he started getting more agitated. Sister at bedside adds that today pt started having more somnolence, listlessness and confusion. Pt feelign a lot better in the ER as he has received O2.  He has no chest pain, dib worse when laying flat. Pt also having a fever at home. Temp 101 at arrival. No abd pain, nausea, emesis, diarrhea. + dry cough.   The history is provided by the patient.    Past Medical History  Diagnosis Date  . Cirrhosis 08/2013    ETOH and Hep C  . Ascites 09/2013  . Thrombocytopenia 09/2013  . Coagulopathy 09/2013    due to hepatic dysfunction.   . Pneumonia 09/2013  . Chronic alcoholism   . Hepatitis C dx'd ~ 2000  . Anxiety   . Inguinal hernia     repaired in [place] 08/2013   Past Surgical History  Procedure Laterality Date  . Incision and drainage perirectal abscess  1980's  . Inguinal hernia repair Right 08/2013    at [facility] in [place].   . Esophagogastroduodenoscopy N/A 10/22/2013    Procedure: ESOPHAGOGASTRODUODENOSCOPY (EGD);  Surgeon: Hilarie FredricksonJohn N Perry, MD;  [facility];  Service: Endoscopy;  Laterality: N/A;  [facility] var[facility]   Family History  Problem Relation Age of Onset  . CAD    . Hypertension    . Diabetes Mother   . Hypertension Mother   . Cancer Father   . Cancer Maternal Grandfather    History  Substance Use Topics  . Smoking status: Former Smoker -- 10 years    Types: Cigarettes  . Smokeless tobacco: Never Used     Comment: "smoked 1/2 pack/wk when I did smoke"   . Alcohol Use: Yes     Comment: 10/07/2013 "I've had 2 drinks since 08/2013; I"ve quit"    Review of Systems  Constitutional: Positive for fever and fatigue. Negative for chills and activity change.  Eyes: Negative for visual disturbance.  Respiratory: Positive for cough and shortness of breath. Negative for chest tightness.   Cardiovascular: Negative for chest pain.  Gastrointestinal: Negative for abdominal distention.  Genitourinary: Negative for dysuria, enuresis and difficulty urinating.  Musculoskeletal: Negative for arthralgias and neck pain.  Allergic/Immunologic: Positive for immunocompromised state.  Neurological: Positive for dizziness and weakness. Negative for light-headedness and headaches.  Hematological: Bruises/bleeds easily.  Psychiatric/Behavioral: Positive for confusion.      Allergies  Bee venom  Home Medications   Prior to Admission medications   Medication Sig Start Date End Date Taking? Authorizing Provider  albuterol (PROVENTIL HFA;VENTOLIN HFA) 108 (90 BASE) MCG/ACT inhaler Inhale 2 puffs into the lungs daily as needed for wheezing or shortness of breath. 11/05/13  Yes Doris Cheadleeepak Advani, MD  clonazePAM (KLONOPIN) 0.5 MG tablet Take 1 tablet (0.5 mg total) by mouth daily as needed for anxiety. 11/05/13  Yes Doris Cheadleeepak Advani, MD  docusate sodium (COLACE) 100 MG capsule Take 1 capsule (100 mg total) by mouth 2 (two) times daily as needed for mild constipation. Hold if you have diarrhea. 11/05/13  Yes Doris Cheadleeepak Advani, MD  fentaNYL (DURAGESIC - DOSED MCG/HR) 25 MCG/HR patch Place 1 patch (25 mcg total) onto the skin every 3 (three) days. 11/05/13  Yes Doris Cheadleeepak Advani, MD  folic acid (FOLVITE) 1 MG tablet Take 1 tablet (1 mg total) by mouth daily. 11/05/13  Yes Doris Cheadleeepak Advani, MD  furosemide (LASIX) 40 MG tablet Take 1 tablet (40 mg total) by mouth daily. 11/05/13  Yes Doris Cheadleeepak Advani, MD  lactulose (CHRONULAC) 10 GM/15ML solution Take 45 mLs (30 g total) by mouth daily. 11/05/13  Yes  Doris Cheadleeepak Advani, MD  Multiple Vitamin (MULTIVITAMIN WITH MINERALS) TABS tablet Take 1 tablet by mouth daily. 09/09/13  Yes Clydia LlanoMutaz Elmahi, MD  omeprazole (PRILOSEC) 40 MG capsule Take 1 capsule (40 mg total) by mouth daily. 11/05/13  Yes Doris Cheadleeepak Advani, MD  oxyCODONE (ROXICODONE) 5 MG immediate release tablet Take 1 tablet (5 mg total) by mouth every 4 (four) hours as needed for severe pain. 10/27/13  Yes Osvaldo ShipperGokul Krishnan, MD  polyethylene glycol (MIRALAX / GLYCOLAX) packet Take 17 g by mouth daily as needed for moderate constipation. 11/05/13  Yes Doris Cheadleeepak Advani, MD  potassium chloride SA (K-DUR,KLOR-CON) 20 MEQ tablet Take 1 tablet (20 mEq total) by mouth daily. 11/05/13  Yes Doris Cheadleeepak Advani, MD  saccharomyces boulardii (FLORASTOR) 250 MG capsule Take 1 capsule (250 mg total) by mouth 2 (two) times daily. For 5 days 11/05/13  Yes Doris Cheadleeepak Advani, MD  tamsulosin (FLOMAX) 0.4 MG CAPS capsule Take 1 capsule (0.4 mg total) by mouth daily. 11/05/13  Yes Doris Cheadleeepak Advani, MD  thiamine 100 MG tablet Take 1 tablet (100 mg total) by mouth daily. 09/09/13  Yes Clydia LlanoMutaz Elmahi, MD  amoxicillin (AMOXIL) 500 MG capsule Take 1 capsule (500 mg total) by mouth every 8 (eight) hours. For 5 more days 10/27/13   Osvaldo ShipperGokul Krishnan, MD   BP 102/57 mmHg  Pulse 107  Temp(Src) 101 F (38.3 C) (Oral)  Resp 31  Ht 5\' 11"  (1.803 m)  Wt 194 lb (87.998 kg)  BMI 27.07 kg/m2  SpO2 92% Physical Exam  Constitutional: He is oriented to person, place, and time. He appears well-developed.  HENT:  Head: Normocephalic and atraumatic.  Eyes: Conjunctivae and EOM are normal. Pupils are equal, round, and reactive to light.  Neck: Normal range of motion. Neck supple. No JVD present.  Cardiovascular: Normal rate and regular rhythm.   Pulmonary/Chest: Effort normal. He has rales.  Poor air entry - R side  Abdominal: Soft. Bowel sounds are normal. He exhibits distension. There is no tenderness. There is no rebound and no guarding.  Musculoskeletal: He  exhibits edema.  2+ pitting edema - bilateral  Neurological: He is alert and oriented to person, place, and time.  Skin: Skin is warm.  Nursing note and vitals reviewed.   ED Course  PARACENTESIS Date/Time: 11/04/2013 5:15 AM Performed by: Derwood KaplanNANAVATI, Lemoyne Scarpati Authorized by: Derwood KaplanNANAVATI, Mikhai Bienvenue Consent: Verbal consent obtained. Risks and benefits: risks, benefits and alternatives were discussed Consent given by: patient Patient understanding: patient states understanding of the procedure being performed Required items: required blood products, implants, devices, and special equipment available Patient identity confirmed: arm band Time out: Immediately prior to procedure a "time out" was called to verify the correct patient, procedure, equipment, support staff and site/side marked as required. Initial or subsequent exam: initial Procedure purpose: diagnostic Indications: suspected peritonitis Anesthesia: local infiltration Local anesthetic: lidocaine 2% with epinephrine Anesthetic total: 3 ml Patient sedated: no Preparation: Patient was prepped and draped in the usual sterile fashion. Needle gauge: 18 Ultrasound  guidance: yes Puncture site: right lower quadrant Fluid removed: 5(ml) Fluid appearance: bloody Dressing: 4x4 sterile gauze and pressure dressing Patient tolerance: Patient tolerated the procedure well with no immediate complications   (including critical care time) Labs Review Labs Reviewed  CBC WITH DIFFERENTIAL - Abnormal; Notable for the following:    WBC 13.0 (*)    RBC 3.58 (*)    Hemoglobin 11.0 (*)    HCT 31.5 (*)    Platelets 126 (*)    Neutro Abs 9.8 (*)    Monocytes Absolute 1.4 (*)    All other components within normal limits  COMPREHENSIVE METABOLIC PANEL - Abnormal; Notable for the following:    Sodium 129 (*)    Chloride 95 (*)    Glucose, Bld 128 (*)    BUN 28 (*)    Calcium 7.5 (*)    Albumin 1.6 (*)    AST 42 (*)    Total Bilirubin 3.7 (*)    GFR  calc non Af Amer 61 (*)    GFR calc Af Amer 71 (*)    All other components within normal limits  PRO B NATRIURETIC PEPTIDE - Abnormal; Notable for the following:    Pro B Natriuretic peptide (BNP) 170.7 (*)    All other components within normal limits  APTT - Abnormal; Notable for the following:    aPTT 48 (*)    All other components within normal limits  BODY FLUID CELL COUNT WITH DIFFERENTIAL - Abnormal; Notable for the following:    Color, Fluid RED (*)    Appearance, Fluid CLOUDY (*)    WBC, Fluid 6226 (*)    Neutrophil Count, Fluid 85 (*)    Monocyte-Macrophage-Serous Fluid 15 (*)    All other components within normal limits  AMMONIA - Abnormal; Notable for the following:    Ammonia 71 (*)    All other components within normal limits  PROTIME-INR - Abnormal; Notable for the following:    Prothrombin Time 27.1 (*)    INR 2.48 (*)    All other components within normal limits  CULTURE, BLOOD (ROUTINE X 2)  CULTURE, BLOOD (ROUTINE X 2)  URINE CULTURE  BODY FLUID CULTURE  TROPONIN I  PROTEIN, BODY FLUID  ALBUMIN, FLUID  URINALYSIS, ROUTINE W REFLEX MICROSCOPIC  LACTATE DEHYDROGENASE, BODY FLUID  GLUCOSE, PERITONEAL FLUID  MAGNESIUM  PHOSPHORUS  LACTIC ACID, PLASMA  I-STAT TROPOININ, ED  I-STAT TROPOININ, ED  I-STAT CG4 LACTIC ACID, ED  I-STAT CG4 LACTIC ACID, ED  PREPARE FRESH FROZEN PLASMA    Imaging Review Dg Chest 2 View  11/08/2013   CLINICAL DATA:  Shortness of breath.  History of liver failure.  EXAM: CHEST  2 VIEW  COMPARISON:  10/16/2013  FINDINGS: Large right pleural effusion with minimal aeration of the right upper lung, increasing since previous study. Consolidation in the right lung. Left lung is clear. Heart size is obscured by the right pleural effusion but appears normal. No pulmonary vascular congestion. Small left pleural effusion. Shunt tubing demonstrated in the right chest.  IMPRESSION: Large and increasing right pleural effusion. Small left pleural  effusion.   Electronically Signed   By: Burman Nieves M.D.   On: 11/18/2013 00:16     EKG Interpretation None     5:13 AM SBP per labs. Antibiotics already ordered. Will give albumin, given it's benefit in pt's with SBP in preventing renal failure. CCM admitting.   CRITICAL CARE Performed by: Derwood Kaplan   Total critical care time:  50 min  Critical care time was exclusive of separately billable procedures and treating other patients.  Critical care was necessary to treat or prevent imminent or life-threatening deterioration.  Critical care was time spent personally by me on the following activities: development of treatment plan with patient and/or surrogate as well as nursing, discussions with consultants, evaluation of patient's response to treatment, examination of patient, obtaining history from patient or surrogate, ordering and performing treatments and interventions, ordering and review of laboratory studies, ordering and review of radiographic studies, pulse oximetry and re-evaluation of patient's condition.   MDM   Final diagnoses:  Alcoholic cirrhosis of liver with ascites  Sepsis due to other etiology  Pleural effusion  Hepatic encephalopathy  Respiratory distress  Spontaneous bacterial peritonitis     Pt comes in with cc of confusion, dib. Hx of hep c and alcoholic liver cirrhosis, with ascites and pleural effusion and recent [place] shunt placement. Pt is also febrile at arrival, and has 3 SIRS criteria on arrival -with WBC of 13K.  SEPSIS: SBP vs. Bacteremia due to recent surgery. Paracenthesis attempted - trace fluid back with the 18 gauge needle. Ceftriaxone and Vanc started to cover for SBP. BP is thready, and staying in the 80s- 100 SBP. Lactate is normal. 1 liter fluid given. Albumin will be give if needed.  DIB: Pleural effusion related. ICU consulted for thoracentesis. They will evaluate the patient for placement.    Derwood Kaplan,  MD 11/14/2013 [phone]

## 2013-11-07 NOTE — ED Notes (Signed)
MD at bedside. 

## 2013-11-07 NOTE — Progress Notes (Signed)
OG tube OK to use per Dr. Craige CottaSood. Reported result of positive blood culture - anaerobic gram negative rods.

## 2013-11-08 ENCOUNTER — Inpatient Hospital Stay (HOSPITAL_COMMUNITY): Payer: Medicaid - Out of State

## 2013-11-08 DIAGNOSIS — R6521 Severe sepsis with septic shock: Secondary | ICD-10-CM

## 2013-11-08 DIAGNOSIS — J90 Pleural effusion, not elsewhere classified: Secondary | ICD-10-CM | POA: Insufficient documentation

## 2013-11-08 DIAGNOSIS — J9601 Acute respiratory failure with hypoxia: Secondary | ICD-10-CM

## 2013-11-08 DIAGNOSIS — K7031 Alcoholic cirrhosis of liver with ascites: Secondary | ICD-10-CM

## 2013-11-08 DIAGNOSIS — K7682 Hepatic encephalopathy: Secondary | ICD-10-CM

## 2013-11-08 DIAGNOSIS — Z01818 Encounter for other preprocedural examination: Secondary | ICD-10-CM

## 2013-11-08 DIAGNOSIS — Z9889 Other specified postprocedural states: Secondary | ICD-10-CM | POA: Insufficient documentation

## 2013-11-08 DIAGNOSIS — K7290 Hepatic failure, unspecified without coma: Secondary | ICD-10-CM

## 2013-11-08 DIAGNOSIS — K652 Spontaneous bacterial peritonitis: Secondary | ICD-10-CM

## 2013-11-08 DIAGNOSIS — N179 Acute kidney failure, unspecified: Secondary | ICD-10-CM

## 2013-11-08 DIAGNOSIS — A4150 Gram-negative sepsis, unspecified: Secondary | ICD-10-CM

## 2013-11-08 LAB — GLUCOSE, CAPILLARY
GLUCOSE-CAPILLARY: 136 mg/dL — AB (ref 70–99)
GLUCOSE-CAPILLARY: 187 mg/dL — AB (ref 70–99)
GLUCOSE-CAPILLARY: 175 mg/dL — AB (ref 70–99)
Glucose-Capillary: 178 mg/dL — ABNORMAL HIGH (ref 70–99)
Glucose-Capillary: 161 mg/dL — ABNORMAL HIGH (ref 70–99)
Glucose-Capillary: 211 mg/dL — ABNORMAL HIGH (ref 70–99)

## 2013-11-08 LAB — URINALYSIS, ROUTINE W REFLEX MICROSCOPIC
Glucose, UA: NEGATIVE mg/dL
Ketones, ur: NEGATIVE mg/dL
Leukocytes, UA: NEGATIVE
Nitrite: NEGATIVE
PROTEIN: NEGATIVE mg/dL
SPECIFIC GRAVITY, URINE: 1.018 (ref 1.005–1.030)
UROBILINOGEN UA: 0.2 mg/dL (ref 0.0–1.0)
pH: 5 (ref 5.0–8.0)

## 2013-11-08 LAB — CBC
HCT: 26.6 % — ABNORMAL LOW (ref 39.0–52.0)
Hemoglobin: 8.9 g/dL — ABNORMAL LOW (ref 13.0–17.0)
MCH: 31 pg (ref 26.0–34.0)
MCHC: 33.5 g/dL (ref 30.0–36.0)
MCV: 92.7 fL (ref 78.0–100.0)
PLATELETS: 58 10*3/uL — AB (ref 150–400)
RBC: 2.87 MIL/uL — ABNORMAL LOW (ref 4.22–5.81)
RDW: 16.5 % — ABNORMAL HIGH (ref 11.5–15.5)
WBC: 42.9 10*3/uL — AB (ref 4.0–10.5)

## 2013-11-08 LAB — PREPARE FRESH FROZEN PLASMA: Unit division: 0

## 2013-11-08 LAB — LACTIC ACID, PLASMA
Lactic Acid, Venous: 5 mmol/L — ABNORMAL HIGH (ref 0.5–2.2)
Lactic Acid, Venous: 3.4 mmol/L — ABNORMAL HIGH (ref 0.5–2.2)

## 2013-11-08 LAB — POCT I-STAT 3, ART BLOOD GAS (G3+)
ACID-BASE DEFICIT: 8 mmol/L — AB (ref 0.0–2.0)
Bicarbonate: 16.9 mEq/L — ABNORMAL LOW (ref 20.0–24.0)
O2 SAT: 88 %
PCO2 ART: 32.5 mmHg — AB (ref 35.0–45.0)
PO2 ART: 59 mmHg — AB (ref 80.0–100.0)
Patient temperature: 98.5
TCO2: 18 mmol/L (ref 0–100)
pH, Arterial: 7.325 — ABNORMAL LOW (ref 7.350–7.450)

## 2013-11-08 LAB — URINE MICROSCOPIC-ADD ON

## 2013-11-08 LAB — BASIC METABOLIC PANEL
ANION GAP: 21 — AB (ref 5–15)
BUN: 38 mg/dL — ABNORMAL HIGH (ref 6–23)
CALCIUM: 6.8 mg/dL — AB (ref 8.4–10.5)
CO2: 14 mEq/L — ABNORMAL LOW (ref 19–32)
CREATININE: 2.01 mg/dL — AB (ref 0.50–1.35)
Chloride: 95 mEq/L — ABNORMAL LOW (ref 96–112)
GFR, EST AFRICAN AMERICAN: 41 mL/min — AB (ref >90)
GFR, EST NON AFRICAN AMERICAN: 36 mL/min — AB (ref >90)
Glucose, Bld: 115 mg/dL — ABNORMAL HIGH (ref 70–99)
Potassium: 4.4 mEq/L (ref 3.7–5.3)
SODIUM: 130 meq/L — AB (ref 137–147)

## 2013-11-08 LAB — PHOSPHORUS
PHOSPHORUS: 5 mg/dL — AB (ref 2.3–4.6)
PHOSPHORUS: 5.2 mg/dL — AB (ref 2.3–4.6)

## 2013-11-08 LAB — PROTIME-INR
INR: 6.17 — AB (ref 0.00–1.49)
Prothrombin Time: 55.1 seconds — ABNORMAL HIGH (ref 11.6–15.2)

## 2013-11-08 LAB — GLUCOSE, PERITONEAL FLUID: Glucose, Peritoneal Fluid: 65 mg/dL

## 2013-11-08 LAB — LIPASE, BLOOD: Lipase: 19 U/L (ref 11–59)

## 2013-11-08 LAB — AMMONIA: Ammonia: 57 umol/L (ref 11–60)

## 2013-11-08 LAB — PROCALCITONIN: PROCALCITONIN: 27 ng/mL

## 2013-11-08 LAB — TROPONIN I: Troponin I: <0.3 ng/mL (ref <0.30)

## 2013-11-08 LAB — MAGNESIUM: Magnesium: 1.4 mg/dL — ABNORMAL LOW (ref 1.5–2.5)

## 2013-11-08 MED ORDER — DEXTROSE 5 % IV SOLN
30.0000 ug/min | INTRAVENOUS | Status: DC
  Administered 2013-11-09: 30 ug/min via INTRAVENOUS
  Administered 2013-11-09: 40 ug/min via INTRAVENOUS
  Filled 2013-11-08 (×2): qty 4

## 2013-11-08 MED ORDER — PRO-STAT SUGAR FREE PO LIQD
30.0000 mL | Freq: Two times a day (BID) | ORAL | Status: DC
  Administered 2013-11-08 – 2013-11-10 (×4): 30 mL
  Filled 2013-11-08 (×6): qty 30

## 2013-11-08 MED ORDER — MAGNESIUM SULFATE IN D5W 10-5 MG/ML-% IV SOLN
1.0000 g | Freq: Once | INTRAVENOUS | Status: AC
  Administered 2013-11-08: 1 g via INTRAVENOUS
  Filled 2013-11-08: qty 100

## 2013-11-08 MED ORDER — VITAL AF 1.2 CAL PO LIQD
1000.0000 mL | ORAL | Status: DC
  Filled 2013-11-08 (×3): qty 1000

## 2013-11-08 MED ORDER — VITAL AF 1.2 CAL PO LIQD
1000.0000 mL | ORAL | Status: DC
  Administered 2013-11-08: 1000 mL
  Filled 2013-11-08 (×7): qty 1000

## 2013-11-08 MED ORDER — DEXTROSE 5 % IV SOLN
30.0000 ug/min | INTRAVENOUS | Status: DC
  Administered 2013-11-08 (×2): 40 ug/min via INTRAVENOUS
  Administered 2013-11-08: 30 ug/min via INTRAVENOUS
  Administered 2013-11-08: 50 ug/min via INTRAVENOUS
  Filled 2013-11-08 (×3): qty 1

## 2013-11-08 MED ORDER — VITAL HIGH PROTEIN PO LIQD
1000.0000 mL | ORAL | Status: DC
  Filled 2013-11-08 (×2): qty 1000

## 2013-11-08 MED ORDER — ALBUMIN HUMAN 25 % IV SOLN
50.0000 g | Freq: Three times a day (TID) | INTRAVENOUS | Status: AC
  Administered 2013-11-08 – 2013-11-09 (×3): 50 g via INTRAVENOUS
  Filled 2013-11-08 (×3): qty 200

## 2013-11-08 MED ORDER — VITAL HIGH PROTEIN PO LIQD: 1000.0000 mL | ORAL | Status: DC

## 2013-11-08 NOTE — Procedures (Signed)
Arterial Catheter Insertion Procedure Note Xavier DuskyRichard L Valentine [id number] 06-Nov-1960  Procedure: Insertion of Arterial Catheter  Indications: Blood pressure monitoring  Procedure Details Consent: Risks of procedure as well as the alternatives and risks of each were explained to the (patient/caregiver).  Consent for procedure obtained. Time Out: Verified patient identification, verified procedure, site/side was marked, verified correct patient position, special equipment/implants available, medications/allergies/relevent history reviewed, required imaging and test results available.  Performed  Maximum sterile technique was used including antiseptics. Skin prep: Chlorhexidine; local anesthetic administered 20 gauge catheter was inserted into right radial artery using the Seldinger technique.  Evaluation Blood flow good; BP tracing good. Complications: No apparent complications   Arterial line placed per MD order.  Pt tolerated well,  RT will continue to monitor.   Closson, Terie PurserMorgan Mayah Urquidi 11/08/2013

## 2013-11-08 NOTE — Progress Notes (Signed)
CRITICAL VALUE ALERT  Critical value received:  6.17 INR  Date of notification:  11/08/2013   Time of notification:  1420  Critical value read back:yes  Nurse who received alert:  Philippa SicksMelinda Georgie Eduardo  MD notified (1st page):  Dr Marchelle Gearingamaswamy  Time of first page:  1415  MD notified (2nd page):na  Time of second page:na  Responding MD:  Dr Marchelle Gearingamaswamy  Time MD responded:  1420

## 2013-11-08 NOTE — Progress Notes (Addendum)
eLink Physician-Brief Progress Note Patient Name: Xavier Valentine DOB: [birth year] MRN: [id number]   Date of Service  11/08/2013  HPI/Events of Note   Patient monitoring after being identified for lighting rounds for worsening shock. Currently on 3 pressors with MAP ~ 65. Lactate was downtrending until today. No cortisol drawn this admission. Albumin 1.6   eICU Interventions   Continue pressor support (MAP goal of 60 is probably sufficient in a cirrhotic) Check cortisol, lactate Albumin challenge 50 g x 3 doses      Intervention Category Major Interventions: Shock - evaluation and management  Raniah Karan R. 11/08/2013, 4:06 PM

## 2013-11-08 NOTE — H&P (Signed)
PULMONARY / CRITICAL CARE MEDICINE   Name: Xavier Valentine MRN: [id number] DOB: 1960/12/18    ADMISSION DATE:  11/26/2013 CONSULTATION DATE:  11/11/2013  REFERRING MD :  EDP  CHIEF COMPLAINT:  AMS  INITIAL PRESENTATION:    STUDIES:  Therapeutic thoracentesis 10/11, 10/12, 10/14  Paracentesis 11/6 >>>  SIGNIFICANT EVENTS: 10/10 - 10/26 Admission to [facility] for recurrent effusions and ascites.  10/22 - [place] shunt placement    SUBJECTIVE:   11/08/13: On 60% fio2, on levophed  And vasopressin. MELD score 28. Paracenetsis 11/19/2013 -c/wSBP 85% polus of 6226 wc and thoracentesis cw SBP - 62% polys of 3710 wbc. GNR rods in blood. Worsening creat. Rising WBC to 40K, dropping platelets  VITAL SIGNS: Temp:  [98.5 F (36.9 C)-101 F (38.3 C)] 98.5 F (36.9 C) (11/07 0755) Pulse Rate:  [30-143] 106 (11/07 1000) Resp:  [17-51] 21 (11/07 1000) BP: (46-164)/(10-128) 90/52 mmHg (11/07 1000) SpO2:  [83 %-100 %] 100 % (11/07 1000) FiO2 (%):  [60 %-100 %] 60 % (11/07 0839) Weight:  [94.1 kg (207 lb 7.3 oz)] 94.1 kg (207 lb 7.3 oz) (11/07 0500) HEMODYNAMICS: CVP:  [8 mmHg-22 mmHg] 22 mmHg VENTILATOR SETTINGS: Vent Mode:  [-] PRVC FiO2 (%):  [60 %-100 %] 60 % Set Rate:  [18 bmp] 18 bmp Vt Set:  [600 mL] 600 mL PEEP:  [5 cmH20-8 cmH20] 8 cmH20 Plateau Pressure:  [12 cmH20-19 cmH20] 12 cmH20 INTAKE / OUTPUT:  Intake/Output Summary (Last 24 hours) at 11/08/13 1054 Last data filed at 11/08/13 0600  Gross per 24 hour  Intake 10591.3 ml  Output   1305 ml  Net 9286.3 ml    PHYSICAL EXAMINATION: General:  Cachectic male, On vent. Criticallyill Neuro:  Follows commands. Does not want extubation HEENT:  [place]/AT, PERRL, sclarea jaundiced.  Cardiovascular: Borderline tachy, regular Lungs:Decreased on right side. Sync with vent  Abdomen: Distended, fluctuant with ascites.  Musculoskeletal:  No acute deformity Skin:  Jaundiced.  LABS: PULMONARY  Recent Labs Lab 11/19/2013 1343  12/01/2013 1615 11/20/2013 1651 11/08/2013 2214  PHART 7.465*  --  7.295* 7.281*  PCO2ART 22.9*  --  32.3* 33.4*  PO2ART 54.0*  --  67.0* 122.0*  HCO3 16.5*  --  15.7* 15.7*  TCO2 17  --  17 17  O2SAT 90.0 64.2 91.0 98.0    CBC  Recent Labs Lab 11/06/13 2354 11/08/13 0251  HGB 11.0* 8.9*  HCT 31.5* 26.6*  WBC 13.0* 42.9*  PLT 126* 58*    COAGULATION  Recent Labs Lab 11/30/2013 0425  INR 2.48*    CARDIAC   Recent Labs Lab 11/09/2013 0157  TROPONINI <0.30    Recent Labs Lab 11/06/13 2354  PROBNP 170.7*     CHEMISTRY  Recent Labs Lab 11/05/13 1143 11/06/13 2354 11/27/2013 0500 11/08/13 0251  NA 131* 129*  --  130*  K 3.6 4.6  --  4.4  CL 95* 95*  --  95*  CO2 26 23  --  14*  GLUCOSE 111* 128*  --  115*  BUN 24* 28*  --  38*  CREATININE 1.07 1.28  --  2.01*  CALCIUM 7.1* 7.5*  --  6.8*  MG  --   --  1.7 1.4*  PHOS  --   --  4.4 5.0*   Estimated Creatinine Clearance: 48.6 mL/min (by C-G formula based on Cr of 2.01).   LIVER  Recent Labs Lab 11/05/13 1143 11/06/13 2354 11/03/2013 0425 11/08/2013 1325  AST 42* 42*  --   --  ALT 22 21  --   --   ALKPHOS 80 89  --   --   BILITOT 3.5* 3.7*  --   --   PROT 6.3 6.5  --  5.2*  ALBUMIN 1.9* 1.6*  --   --   INR  --   --  2.48*  --      INFECTIOUS  Recent Labs Lab 11-21-13 0736 November 21, 2013 1338 11/21/13 1614  LATICACIDVEN 1.9 7.6* 8.0*     ENDOCRINE CBG (last 3)   Recent Labs  2013/11/21 2125 21-Nov-2013 2305 11/08/13 0728  GLUCAP 126* 136* 161*         IMAGING x48h Dg Chest 2 View  21-Nov-2013   CLINICAL DATA:  Shortness of breath.  History of liver failure.  EXAM: CHEST  2 VIEW  COMPARISON:  10/16/2013  FINDINGS: Large right pleural effusion with minimal aeration of the right upper lung, increasing since previous study. Consolidation in the right lung. Left lung is clear. Heart size is obscured by the right pleural effusion but appears normal. No pulmonary vascular congestion. Small left  pleural effusion. Shunt tubing demonstrated in the right chest.  IMPRESSION: Large and increasing right pleural effusion. Small left pleural effusion.   Electronically Signed   By: Burman Nieves M.D.   On: November 21, 2013 00:16   Dg Chest Port 1 View  11/08/2013   CLINICAL DATA:  Pleural effusion.  EXAM: PORTABLE CHEST - 1 VIEW  COMPARISON:  November 21, 2013  FINDINGS: Endotracheal tube terminates between the clavicular heads and carina, unchanged from yesterday. Left IJ catheter is in stable position. Right-sided [place] shunt in stable position (visualized portions). Orogastric tube reaches the stomach.  Large right pleural effusion. Although the apical component appears decreased, this is likely from differences in patient positioning. A retrocardiac opacity with air bronchograms is again noted. No pneumothorax.  IMPRESSION: 1. Unchanged positioning of tubes and lines. 2. Unchanged large right pleural effusion. 3. Retrocardiac atelectasis or pneumonia.   Electronically Signed   By: Tiburcio Pea M.D.   On: 11/08/2013 07:27   Dg Chest Port 1 View  21-Nov-2013   CLINICAL DATA:  53 year old male status post emergent intubation, a declining oxygenation. Initial encounter.  EXAM: PORTABLE CHEST - 1 VIEW  COMPARISON:  1249 hr the same day and earlier.  FINDINGS: Portable AP semi upright view at 1549 hrs. Intubated. Endotracheal tube tip in the midline projects 2 cm above the carina. Stable right chest catheter. Left IJ central line placed, tip at the cavoatrial junction level. Enteric tube placed, courses to the left abdomen and tip not included.  Larger lung volumes. Large right pleural effusion re- identified. Mildly increased left perihilar opacity. No pneumothorax.  IMPRESSION: 1. Intubated, endotracheal tube in good position. 2. Left IJ central line placed, tip at the cavoatrial junction level. 3. Enteric tube placed, courses to the left abdomen tip not included. 4. Mildly increased patchy left perihilar opacity.  Large right pleural effusion.   Electronically Signed   By: Augusto Gamble M.D.   On: 2013/11/21 16:13   Dg Chest Port 1 View  11-21-13   CLINICAL DATA:  Difficulty breathing. Status post right-sided thoracentesis  EXAM: PORTABLE CHEST - 1 VIEW  COMPARISON:  Nov 21, 2013  FINDINGS: Right effusion is considerably smaller status post thoracentesis. There is consolidation throughout much of the right mid and lower lung zones with some layering effusion remaining on the right. The left lung is clear. Heart size and pulmonary vascularity are normal. Central catheter  tip is at cavoatrial junction. No pneumothorax. No adenopathy.  IMPRESSION: No pneumothorax. Moderate right effusion remains, although effusion is considerably smaller. There remains consolidation throughout much of the right lung, although there has been improved aeration on the right middle and lower lobes following thoracentesis procedure. Left lung remains clear.   Electronically Signed   By: Bretta Bang M.D.   On: 11/11/2013 13:04       ASSESSMENT / PLAN:  PULMONARY A: Acute hypoxic respiratory failure 2nd to effusion and septic shock. Intubated 11/04/2013 Large R pleural effusion - SB pleuritis 11/20/2013  P:   Full vent supprt No thora When stable can consider TIPS (child pugh is 14 and indicative for risk for Hep Enceph)   CARDIOVASCULAR A:  Septic shock - on max pressors  P:  Pressors for MAP > 65; add neo  RENAL A:   BPH   - now with AKI/ATN and lactic acidosis  P:   Follow Bmet Trend lactic acid Dc  tamsulosin  GASTROINTESTINAL A:  ETOH/hepC cirrhosis Ascites MELD score: 18 at admit. Currently 29 and worse  P:   Lactulose  No Xifaxan due to high MELD STart tube feeds   HEMATOLOGIC A:   Coagulopathy r/t cirrhosis Thrombocytopenia Anemia of critical illness   - all worsening due to sepsis  P:  Monitor closely PRBC for hgb < 7gm%   INFECTIOUS Results for orders placed or performed  during the hospital encounter of 11/04/2013  Blood Culture (routine x 2)     Status: None (Preliminary result)   Collection Time: 11/06/2013  2:00 AM  Result Value Ref Range Status   Specimen Description BLOOD LEFT ARM  Final   Special Requests BOTTLES DRAWN AEROBIC AND ANAEROBIC 10 CC  Final   Culture  Setup Time   Final    11/14/2013 13:30 Performed at [facility]    Culture   Final    GRAM NEGATIVE RODS Note: Gram Stain Report Called to,Read Back By and Verified With: Ladora Daniel RN [id number] Performed at [facility]    Report Status PENDING  Incomplete  Body fluid culture     Status: None (Preliminary result)   Collection Time: 12/01/2013  3:14 AM  Result Value Ref Range Status   Specimen Description PERITONEAL CAVITY  Final   Special Requests NONE  Final   Gram Stain   Final    FEW WBC PRESENT,BOTH PMN AND MONONUCLEAR NO ORGANISMS SEEN Performed at [facility]    Culture PENDING  Incomplete   Report Status PENDING  Incomplete  MRSA PCR Screening     Status: None   Collection Time: 11/11/2013  6:50 AM  Result Value Ref Range Status   MRSA by PCR NEGATIVE NEGATIVE Final    Comment:        The GeneXpert MRSA Assay (FDA approved for NASAL specimens only), is one component of a comprehensive MRSA colonization surveillance program. It is not intended to diagnose MRSA infection nor to guide or monitor treatment for MRSA infections.   Body fluid culture     Status: None (Preliminary result)   Collection Time: 11/19/2013 12:45 PM  Result Value Ref Range Status   Specimen Description FLUID RIGHT PLEURAL  Final   Special Requests Normal  Final   Gram Stain   Final    MODERATE WBC PRESENT, PREDOMINANTLY MONONUCLEAR NO ORGANISMS SEEN Performed at [facility]    Culture PENDING  Incomplete   Report Status PENDING  Incomplete  A:   Septic shock, GNR in blood/ c/w SBP - severe  P:   Monitor clinically  Anti-infectives    Start     Dose/Rate  Route Frequency Ordered Stop   11/08/13 0315  vancomycin (VANCOCIN) IVPB 1000 mg/200 mL premix     1,000 mg200 mL/hr over 60 Minutes Intravenous Every 12 hours 11/24/2013 1505     11/05/2013 1515  vancomycin (VANCOCIN) 1,500 mg in sodium chloride 0.9 % 500 mL IVPB     1,500 mg250 mL/hr over 120 Minutes Intravenous  Once 11/13/2013 1505 12/01/2013 1715   11/25/2013 1515  ceFEPIme (MAXIPIME) 1 g in dextrose 5 % 50 mL IVPB     1 g100 mL/hr over 30 Minutes Intravenous 3 times per day 11/04/2013 1506     11/14/2013 0330  vancomycin (VANCOCIN) IVPB 1000 mg/200 mL premix     1,000 mg200 mL/hr over 60 Minutes Intravenous  Once 11/19/2013 0316 11/19/2013 0534   11/21/2013 0330  cefTRIAXone (ROCEPHIN) 2 g in dextrose 5 % 50 mL IVPB     2 g100 mL/hr over 30 Minutes Intravenous  Once 11/25/2013 0316 11/19/2013 0424       ENDOCRINE A:   No acute issues  P:   Monitor glucose on Bmet  NEUROLOGIC A:   Hepatic encephalopathy on lactulose Pain Management   -  Mind is clear on lactulose  P:   RASS goal: 0 to -2 Lactulose  Consider adding back rifaximin (recently stopped by GI); but MELD score needs to reduce to < 25 or ideally < 19 (per salix website) Thiamine, folate  fentanyl for sedation Monitor   FAMILY  - Updates: Sistes x 2 including DPOA  - Inter-disciplinary family meet or Palliative Care meeting due by:  Briefly done at bedside 11/08/13: . Code status clarified . Sisters x 2 discussed: they are open to talking to palliative care and has MAary Larach cell. They will call her when they feel so. For now full medical care except DNR when arrests. They are not wanting to give up but agree his qwuality of life is important. Their vision is for him to get better to get transplant     Code Status Orders        Start     Ordered   11/27/2013 0511  Limited resuscitation (code)   Continuous    Question Answer Comment  In the event of cardiac or respiratory ARREST: Perform CPR No   In the event of cardiac or  respiratory ARREST: Perform Intubation/Mechanical Ventilation Yes   In the event of cardiac or respiratory ARREST: Perform Defibrillation or Cardioversion if indicated No   Antiarrhythmic drugs - Any drug used to treat arrhythmias Yes   Vasoactive drug - Any drug used to stabilize blood pressure Yes      11/20/2013 0511       The patient is critically ill with multiple organ systems failure and requires high complexity decision making for assessment and support, frequent evaluation and titration of therapies, application of advanced monitoring technologies and extensive interpretation of multiple databases.   Critical Care Time devoted to patient care services described in this note is  45  Minutes. This time reflects time of care of this signee Dr Kalman ShanMurali Janine Reller. This critical care time does not reflect procedure time, or teaching time or supervisory time of PA/NP/Med student/Med Resident etc but could involve care discussion time    Dr. Kalman ShanMurali Salaya Holtrop, M.D., [facility] HospitalF.C.C.P Pulmonary and Critical Care Medicine Staff Physician [facility] Pulmonary and Critical Care Pager: [phone], If no answer or between  15:00h - 7:00h: call [phone]  11/08/2013 11:34 AM

## 2013-11-08 NOTE — Progress Notes (Signed)
eLink Physician-Brief Progress Note Patient Name: Xavier DuskyRichard L Meland DOB: 1960-06-15 MRN: [id number]   Date of Service  11/08/2013  HPI/Events of Note   Elevated Glucose and on D10. Starting TFs.   eICU Interventions   Stopping D10.       Intervention Category Minor Interventions: Routine modifications to care plan (e.g. PRN medications for pain, fever)  Andrea Colglazier R. 11/08/2013, 7:09 PM

## 2013-11-08 NOTE — Progress Notes (Addendum)
INITIAL NUTRITION ASSESSMENT  DOCUMENTATION CODES Per approved criteria  -Not Applicable   INTERVENTION: Initiate TF via OGT with Vital Af 1.2 at 25 ml/h and Prostat 30 ml BID on day 1; on day 2, D/C Prostat and increase to goal rate of 75 ml/h (1800 ml per day) to provide 2160 kcals, 135 gm protein, 1519 ml free water daily.  NUTRITION DIAGNOSIS: Inadequate oral intake related to inability to eat as evidenced by NPO.   Goal: Pt to meet >/= 90% of their estimated nutrition needs   Monitor:  Weight trend, I/O's, initiation and adequacy of TF, vent status  Reason for Assessment: Consult received for TF initiation and management  53 y.o. male  Admitting Dx: <principal problem not specified>  ASSESSMENT: 53 year old male with PMH cirrhosis and recurrent pleural effusion presents with SOB and AMS 11/6. In ED noted to have recurrent large R effusion. PCCM asked to see for further eval and potential thoracentesis.  - Per MD note, pt only wants short-term intubation.  Patient is currently intubated on ventilator support MV: 11.4 L/min Temp (24hrs), Avg:99.5 F (37.5 C), Min:98.5 F (36.9 C), Max:101 F (38.3 C)   Height: Ht Readings from Last 1 Encounters:  09/21/2013 5\' 11"  (1.803 m)    Weight: Wt Readings from Last 1 Encounters:  11/08/13 207 lb 7.3 oz (94.1 kg)    Ideal Body Weight: 75.3 kg  % Ideal Body Weight: 125%  Wt Readings from Last 10 Encounters:  11/08/13 207 lb 7.3 oz (94.1 kg)  11/05/13 194 lb (87.998 kg)  10/28/13 181 lb 3.5 oz (82.2 kg)  10/10/13 189 lb 13.1 oz (86.1 kg)  10/01/13 167 lb 15.9 oz (76.2 kg)  09/26/13 177 lb 3.2 oz (80.377 kg)  09/21/13 186 lb 9.6 oz (84.641 kg)  09/12/13 184 lb (83.462 kg)  09/05/13 196 lb 6.9 oz (89.1 kg)    BMI:  Body mass index is 28.95 kg/(m^2).  Estimated Nutritional Needs: Kcal: 2264 Protein: 130-140 g Fluid: Per MD  Skin: closed incision on right abdomen  Diet Order: Diet NPO time  specified  EDUCATION NEEDS: -Education not appropriate at this time   Intake/Output Summary (Last 24 hours) at 11/08/13 1016 Last data filed at 11/08/13 0600  Gross per 24 hour  Intake 10591.3 ml  Output   1305 ml  Net 9286.3 ml    Last BM: 11/6   Labs:   Recent Labs Lab 11/05/13 1143 11/06/13 2354 09/21/2013 0500 11/08/13 0251  NA 131* 129*  --  130*  K 3.6 4.6  --  4.4  CL 95* 95*  --  95*  CO2 26 23  --  14*  BUN 24* 28*  --  38*  CREATININE 1.07 1.28  --  2.01*  CALCIUM 7.1* 7.5*  --  6.8*  MG  --   --  1.7 1.4*  PHOS  --   --  4.4 5.0*  GLUCOSE 111* 128*  --  115*    CBG (last 3)   Recent Labs  09/21/2013 2125 09/21/2013 2305 11/08/13 0728  GLUCAP 126* 136* 161*    Scheduled Meds: . sodium chloride   Intravenous Once  . sodium chloride   Intravenous Once  . antiseptic oral rinse  7 mL Mouth Rinse QID  . ceFEPime (MAXIPIME) IV  1 g Intravenous 3 times per day  . chlorhexidine  15 mL Mouth Rinse BID  . etomidate  20 mg Intravenous Once  . fentaNYL  50 mcg Intravenous  Once  . folic acid  1 mg Per Tube Daily  . lactulose  30 g Per Tube TID  . pantoprazole (PROTONIX) IV  40 mg Intravenous Q12H  . rocuronium  50 mg Intravenous Once  . tamsulosin  0.4 mg Oral Daily  . thiamine  100 mg Per Tube Daily  . vancomycin  1,000 mg Intravenous Q12H    Continuous Infusions: . dextrose 100 mL/hr at 11/08/13 0311  . fentaNYL infusion INTRAVENOUS 25 mcg/hr (11/08/13 0000)  . norepinephrine (LEVOPHED) Adult infusion 50 mcg/min (11/08/13 0313)  . vasopressin (PITRESSIN) infusion - *FOR SHOCK* 0.03 Units/min (2013-01-10 1758)    Past Medical History  Diagnosis Date  . Cirrhosis 08/2013    ETOH and Hep C  . Ascites 09/2013  . Thrombocytopenia 09/2013  . Coagulopathy 09/2013    due to hepatic dysfunction.   . Pneumonia 09/2013  . Chronic alcoholism   . Hepatitis C dx'd ~ 2000  . Anxiety   . Inguinal hernia     repaired in [place] 08/2013    Past Surgical History   Procedure Laterality Date  . Incision and drainage perirectal abscess  1980's  . Inguinal hernia repair Right 08/2013    [facility] [place]   . Esophagogastroduodenoscopy N/A 10/22/2013    Procedure: ESOPHAGOGASTRODUODENOSCOPY (EGD);  Surgeon: Hilarie FredricksonJohn N Perry, MD;  [facility];  Service: Endoscopy;  Laterality: N/A;  [facility] out [facility]ices [facility]    Emmaline KluverHaley Cassara Nida RD, LDN

## 2013-11-08 NOTE — Progress Notes (Signed)
eLink Physician-Brief Progress Note Patient Name: Xavier Valentine DOB: 1960-02-27 MRN: [id number]   Date of Service  11/08/2013  HPI/Events of Note   Reviewed antibiotic regimen per protocol given presence of positive microbacterial testing (E. Coli bacteremia). On cefepime.   eICU Interventions   No change.       Intervention Category Minor Interventions: Routine modifications to care plan (e.g. PRN medications for pain, fever)  Ksenia Kunz R. 11/08/2013, 5:14 PM

## 2013-11-09 ENCOUNTER — Inpatient Hospital Stay (HOSPITAL_COMMUNITY): Payer: Medicaid - Out of State

## 2013-11-09 DIAGNOSIS — R6521 Severe sepsis with septic shock: Secondary | ICD-10-CM

## 2013-11-09 DIAGNOSIS — A419 Sepsis, unspecified organism: Secondary | ICD-10-CM

## 2013-11-09 DIAGNOSIS — K7031 Alcoholic cirrhosis of liver with ascites: Secondary | ICD-10-CM | POA: Insufficient documentation

## 2013-11-09 DIAGNOSIS — A4189 Other specified sepsis: Secondary | ICD-10-CM

## 2013-11-09 LAB — CBC WITH DIFFERENTIAL/PLATELET
BASOS PCT: 0 % (ref 0–1)
Basophils Absolute: 0 10*3/uL (ref 0.0–0.1)
Eosinophils Absolute: 0 10*3/uL (ref 0.0–0.7)
Eosinophils Relative: 0 % (ref 0–5)
HCT: 21.5 % — ABNORMAL LOW (ref 39.0–52.0)
Hemoglobin: 7.5 g/dL — ABNORMAL LOW (ref 13.0–17.0)
Lymphocytes Relative: 8 % — ABNORMAL LOW (ref 12–46)
Lymphs Abs: 2.7 10*3/uL (ref 0.7–4.0)
MCH: 31.4 pg (ref 26.0–34.0)
MCHC: 34.9 g/dL (ref 30.0–36.0)
MCV: 90 fL (ref 78.0–100.0)
MONO ABS: 3 10*3/uL — AB (ref 0.1–1.0)
Monocytes Relative: 9 % (ref 3–12)
NEUTROS PCT: 83 % — AB (ref 43–77)
Neutro Abs: 27.8 10*3/uL — ABNORMAL HIGH (ref 1.7–7.7)
Platelets: 40 10*3/uL — ABNORMAL LOW (ref 150–400)
RBC: 2.39 MIL/uL — ABNORMAL LOW (ref 4.22–5.81)
RDW: 15.8 % — AB (ref 11.5–15.5)
WBC Morphology: INCREASED
WBC: 33.5 10*3/uL — ABNORMAL HIGH (ref 4.0–10.5)

## 2013-11-09 LAB — HEPATIC FUNCTION PANEL
ALK PHOS: 65 U/L (ref 39–117)
ALT: 16 U/L (ref 0–53)
AST: 27 U/L (ref 0–37)
Albumin: 2.8 g/dL — ABNORMAL LOW (ref 3.5–5.2)
BILIRUBIN DIRECT: 1.7 mg/dL — AB (ref 0.0–0.3)
Indirect Bilirubin: 2.6 mg/dL — ABNORMAL HIGH (ref 0.3–0.9)
TOTAL PROTEIN: 5.5 g/dL — AB (ref 6.0–8.3)
Total Bilirubin: 4.3 mg/dL — ABNORMAL HIGH (ref 0.3–1.2)

## 2013-11-09 LAB — BASIC METABOLIC PANEL
ANION GAP: 13 (ref 5–15)
BUN: 40 mg/dL — ABNORMAL HIGH (ref 6–23)
CHLORIDE: 95 meq/L — AB (ref 96–112)
CO2: 20 meq/L (ref 19–32)
Calcium: 7.5 mg/dL — ABNORMAL LOW (ref 8.4–10.5)
Creatinine, Ser: 1.53 mg/dL — ABNORMAL HIGH (ref 0.50–1.35)
GFR calc Af Amer: 57 mL/min — ABNORMAL LOW (ref >90)
GFR calc non Af Amer: 50 mL/min — ABNORMAL LOW (ref >90)
GLUCOSE: 129 mg/dL — AB (ref 70–99)
Potassium: 4.2 mEq/L (ref 3.7–5.3)
SODIUM: 128 meq/L — AB (ref 137–147)

## 2013-11-09 LAB — POCT I-STAT 3, ART BLOOD GAS (G3+)
Acid-base deficit: 5 mmol/L — ABNORMAL HIGH (ref 0.0–2.0)
Bicarbonate: 20.2 mEq/L (ref 20.0–24.0)
O2 SAT: 91 %
PCO2 ART: 34.8 mmHg — AB (ref 35.0–45.0)
PO2 ART: 62 mmHg — AB (ref 80.0–100.0)
Patient temperature: 98.1
TCO2: 21 mmol/L (ref 0–100)
pH, Arterial: 7.37 (ref 7.350–7.450)

## 2013-11-09 LAB — PHOSPHORUS
Phosphorus: 4 mg/dL (ref 2.3–4.6)
Phosphorus: 4.3 mg/dL (ref 2.3–4.6)
Phosphorus: 4.6 mg/dL (ref 2.3–4.6)

## 2013-11-09 LAB — PROTIME-INR
INR: 4.24 — AB (ref 0.00–1.49)
PROTHROMBIN TIME: 41.1 s — AB (ref 11.6–15.2)

## 2013-11-09 LAB — LACTIC ACID, PLASMA: Lactic Acid, Venous: 2.8 mmol/L — ABNORMAL HIGH (ref 0.5–2.2)

## 2013-11-09 LAB — PROCALCITONIN: PROCALCITONIN: 17.55 ng/mL

## 2013-11-09 LAB — GLUCOSE, CAPILLARY
GLUCOSE-CAPILLARY: 146 mg/dL — AB (ref 70–99)
GLUCOSE-CAPILLARY: 95 mg/dL (ref 70–99)
Glucose-Capillary: 122 mg/dL — ABNORMAL HIGH (ref 70–99)
Glucose-Capillary: 102 mg/dL — ABNORMAL HIGH (ref 70–99)
Glucose-Capillary: 97 mg/dL (ref 70–99)
Glucose-Capillary: 96 mg/dL (ref 70–99)

## 2013-11-09 LAB — MAGNESIUM: MAGNESIUM: 1.6 mg/dL (ref 1.5–2.5)

## 2013-11-09 LAB — TROPONIN I: Troponin I: <0.3 ng/mL (ref <0.30)

## 2013-11-09 MED ORDER — WHITE PETROLATUM GEL
Status: AC
  Filled 2013-11-09: qty 5

## 2013-11-09 MED ORDER — WHITE PETROLATUM GEL
Status: AC
  Administered 2013-11-09: 0.2
  Filled 2013-11-09: qty 5

## 2013-11-09 NOTE — Progress Notes (Signed)
Right chest [place] shunt pumped ten times by Beckey DowningPam Turpin.

## 2013-11-09 NOTE — Progress Notes (Signed)
PULMONARY / CRITICAL CARE MEDICINE   Name: Xavier Valentine MRN: [id number] DOB: 04-22-1960    ADMISSION DATE:  11/23/2013 CONSULTATION DATE:  11/02/2013  REFERRING MD :  EDP  CHIEF COMPLAINT:  AMS  INITIAL PRESENTATION:    STUDIES:  Therapeutic thoracentesis 10/11, 10/12, 10/14  Paracentesis 11/6 >>>  SIGNIFICANT EVENTS: 10/10 - 10/26 Admission to [facility] for recurrent effusions and ascites.  10/22 - [place] shunt placement    SUBJECTIVE:   11/8 awake and alert, pressors weaning  VITAL SIGNS: Temp:  [98 F (36.7 C)-99.1 F (37.3 C)] 98.1 F (36.7 C) (11/08 0745) Pulse Rate:  [80-110] 96 (11/08 0900) Resp:  [17-27] 18 (11/08 0900) BP: (77-130)/(48-68) 89/57 mmHg (11/08 0900) SpO2:  [96 %-100 %] 100 % (11/08 0900) Arterial Line BP: (80-138)/(37-63) 110/45 mmHg (11/08 0900) FiO2 (%):  [40 %] 40 % (11/08 0821) Weight:  [207 lb 0.2 oz (93.9 kg)] 207 lb 0.2 oz (93.9 kg) (11/08 0500) HEMODYNAMICS: CVP:  [11 mmHg] 11 mmHg VENTILATOR SETTINGS: Vent Mode:  [-] PRVC FiO2 (%):  [40 %] 40 % Set Rate:  [18 bmp] 18 bmp Vt Set:  [600 mL] 600 mL PEEP:  [5 cmH20-8 cmH20] 5 cmH20 Plateau Pressure:  [17 cmH20-21 cmH20] 20 cmH20 INTAKE / OUTPUT:  Intake/Output Summary (Last 24 hours) at 11/09/13 1003 Last data filed at 11/09/13 0900  Gross per 24 hour  Intake 3714.25 ml  Output   1410 ml  Net 2304.25 ml    PHYSICAL EXAMINATION: General:  Cachectic male, On vent. Criticallyill Neuro:  Follows commands. Interactive HEENT:  [place]/AT, PERRL, sclarea jaundiced.  Cardiovascular: Borderline tachy, regular Lungs:Decreased on right side. Sync with vent  Abdomen: Distended, fluctuant with ascites.  Musculoskeletal:  No acute deformity Skin:  Jaundiced. Rt axiliary area with breakdown  LABS: PULMONARY  Recent Labs Lab 11/21/2013 1343 11/16/2013 1615 11/27/2013 1651 11/27/2013 2214 11/08/13 1331  PHART 7.465*  --  7.295* 7.281* 7.325*  PCO2ART 22.9*  --  32.3* 33.4* 32.5*  PO2ART 54.0*   --  67.0* 122.0* 59.0*  HCO3 16.5*  --  15.7* 15.7* 16.9*  TCO2 17  --  17 17 18   O2SAT 90.0 64.2 91.0 98.0 88.0    CBC  Recent Labs Lab 11/06/13 2354 11/08/13 0251 11/09/13 0421  HGB 11.0* 8.9* 7.5*  HCT 31.5* 26.6* 21.5*  WBC 13.0* 42.9* 33.5*  PLT 126* 58* 40*    COAGULATION  Recent Labs Lab 11/15/2013 0425 11/08/13 1110 11/09/13 0421  INR 2.48* 6.17* 4.24*    CARDIAC    Recent Labs Lab 11/23/2013 0157 11/08/13 1110 11/09/13 0421  TROPONINI <0.30 <0.30 <0.30    Recent Labs Lab 11/06/13 2354  PROBNP 170.7*     CHEMISTRY  Recent Labs Lab 11/05/13 1143 11/06/13 2354 12/01/2013 0500 11/08/13 0251 11/08/13 1110 11/09/13 0005 11/09/13 0421  NA 131* 129*  --  130*  --   --  128*  K 3.6 4.6  --  4.4  --   --  4.2  CL 95* 95*  --  95*  --   --  95*  CO2 26 23  --  14*  --   --  20  GLUCOSE 111* 128*  --  115*  --   --  129*  BUN 24* 28*  --  38*  --   --  40*  CREATININE 1.07 1.28  --  2.01*  --   --  1.53*  CALCIUM 7.1* 7.5*  --  6.8*  --   --  7.5*  MG  --   --  1.7 1.4*  --   --  1.6  PHOS  --   --  4.4 5.0* 5.2* 4.6 4.3   Estimated Creatinine Clearance: 63.8 mL/min (by C-G formula based on Cr of 1.53).   LIVER  Recent Labs Lab 11/05/13 1143 11/06/13 2354 11/24/2013 0425 11/09/2013 1325 11/08/13 1110 11/09/13 0421  AST 42* 42*  --   --   --  27  ALT 22 21  --   --   --  16  ALKPHOS 80 89  --   --   --  65  BILITOT 3.5* 3.7*  --   --   --  4.3*  PROT 6.3 6.5  --  5.2*  --  5.5*  ALBUMIN 1.9* 1.6*  --   --   --  2.8*  INR  --   --  2.48*  --  6.17* 4.24*     INFECTIOUS  Recent Labs Lab 11/08/2013 1614 11/08/13 1100 11/08/13 1110 11/08/13 1746 11/09/13 0421  LATICACIDVEN 8.0* 5.0*  --  3.4*  --   PROCALCITON  --   --  27.00  --  17.55     ENDOCRINE CBG (last 3)   Recent Labs  11/08/13 2358 11/09/13 0359 11/09/13 0733  GLUCAP 146* 122* 102*         IMAGING x48h Dg Chest Port 1 View  11/09/2013   CLINICAL DATA:   Acute respiratory failure  EXAM: PORTABLE CHEST - 1 VIEW  COMPARISON:  11/08/2013  FINDINGS: Endotracheal tube tip is 1.8 cm above the carina. Left IJ central venous catheter is noted with tip over the cavoatrial junction. Right-sided presumed IJ approach line terminates over the cavoatrial junction. Increased hazy perihilar airspace opacity is noted with stable posteriorly layering large right and small left pleural effusions. Mild enlargement of the cardiomediastinal silhouette is reidentified.  IMPRESSION: Increased bilateral perihilar airspace opacities suggesting superimposed edema on underlying moderate right and small left pleural effusions.   Electronically Signed   By: Christiana PellantGretchen  Green M.D.   On: 11/09/2013 07:46   Dg Chest Port 1 View  11/08/2013   CLINICAL DATA:  Pleural effusion.  EXAM: PORTABLE CHEST - 1 VIEW  COMPARISON:  11/30/2013  FINDINGS: Endotracheal tube terminates between the clavicular heads and carina, unchanged from yesterday. Left IJ catheter is in stable position. Right-sided [place] shunt in stable position (visualized portions). Orogastric tube reaches the stomach.  Large right pleural effusion. Although the apical component appears decreased, this is likely from differences in patient positioning. A retrocardiac opacity with air bronchograms is again noted. No pneumothorax.  IMPRESSION: 1. Unchanged positioning of tubes and lines. 2. Unchanged large right pleural effusion. 3. Retrocardiac atelectasis or pneumonia.   Electronically Signed   By: Tiburcio PeaJonathan  Watts M.D.   On: 11/08/2013 07:27   Dg Chest Port 1 View  12/01/2013   CLINICAL DATA:  53 year old male status post emergent intubation, a declining oxygenation. Initial encounter.  EXAM: PORTABLE CHEST - 1 VIEW  COMPARISON:  1249 hr the same day and earlier.  FINDINGS: Portable AP semi upright view at 1549 hrs. Intubated. Endotracheal tube tip in the midline projects 2 cm above the carina. Stable right chest catheter. Left IJ  central line placed, tip at the cavoatrial junction level. Enteric tube placed, courses to the left abdomen and tip not included.  Larger lung volumes. Large right pleural effusion re- identified. Mildly increased left perihilar opacity. No pneumothorax.  IMPRESSION: 1.  Intubated, endotracheal tube in good position. 2. Left IJ central line placed, tip at the cavoatrial junction level. 3. Enteric tube placed, courses to the left abdomen tip not included. 4. Mildly increased patchy left perihilar opacity. Large right pleural effusion.   Electronically Signed   By: Augusto Gamble M.D.   On: 18-Nov-2013 16:13   Dg Chest Port 1 View  2013-11-18   CLINICAL DATA:  Difficulty breathing. Status post right-sided thoracentesis  EXAM: PORTABLE CHEST - 1 VIEW  COMPARISON:  11-18-2013  FINDINGS: Right effusion is considerably smaller status post thoracentesis. There is consolidation throughout much of the right mid and lower lung zones with some layering effusion remaining on the right. The left lung is clear. Heart size and pulmonary vascularity are normal. Central catheter tip is at cavoatrial junction. No pneumothorax. No adenopathy.  IMPRESSION: No pneumothorax. Moderate right effusion remains, although effusion is considerably smaller. There remains consolidation throughout much of the right lung, although there has been improved aeration on the right middle and lower lobes following thoracentesis procedure. Left lung remains clear.   Electronically Signed   By: Bretta Bang M.D.   On: November 18, 2013 13:04       ASSESSMENT / PLAN:  PULMONARY A: Acute hypoxic respiratory failure 2nd to effusion and septic shock. Intubated 11-18-2013 Large R pleural effusion - SB pleuritis 11/18/2013  P:   Full vent support No thora When stable can consider TIPS (child pugh is 14 and indicative for risk for Hep Enceph)   CARDIOVASCULAR A:  Septic shock - on 3 pressors  P:  Pressors for MAP > 65; add neo  RENAL Lab  Results  Component Value Date   CREATININE 1.53* 11/09/2013   CREATININE 2.01* 11/08/2013   CREATININE 1.28 11/06/2013   CREATININE 1.07 11/05/2013    A:   BPH   -hyponatremia  - now with AKI/ATN and lactic acidosis  P:   Follow Bmet Trend lactic acid Dc  tamsulosin  GASTROINTESTINAL A:  ETOH/hepC cirrhosis Ascites MELD score: 18 at admit.   P:   Lactulose  No Xifaxan due to high MELD STart tube feeds   HEMATOLOGIC  Recent Labs  11/08/13 0251 11/09/13 0421  HGB 8.9* 7.5*   Lab Results  Component Value Date   INR 4.24* 11/09/2013   INR 6.17* 11/08/2013   INR 2.48* 18-Nov-2013    A:   Coagulopathy r/t cirrhosis Thrombocytopenia Anemia of critical illness   - all worsening due to sepsis  P:  Monitor closely PRBC for hgb < 7gm%   INFECTIOUS Results for orders placed or performed during the hospital encounter of 18-Nov-2013  Blood Culture (routine x 2)     Status: None (Preliminary result)   Collection Time: 18-Nov-2013  2:00 AM  Result Value Ref Range Status   Specimen Description BLOOD LEFT ARM  Final   Special Requests BOTTLES DRAWN AEROBIC AND ANAEROBIC 10 CC  Final   Culture  Setup Time   Final    Nov 18, 2013 13:30 Performed at [facility]    Culture   Final    ESCHERICHIA COLI Note: Gram Stain Report Called to,Read Back By and Verified With: Ladora Daniel RN [id number] Performed at [facility]    Report Status PENDING  Incomplete  Body fluid culture     Status: None (Preliminary result)   Collection Time: 11/18/13  3:14 AM  Result Value Ref Range Status   Specimen Description PERITONEAL CAVITY  Final   Special Requests NONE  Final   Gram Stain   Final    FEW WBC PRESENT,BOTH PMN AND MONONUCLEAR NO ORGANISMS SEEN Performed at [facility]    Culture PENDING  Incomplete   Report Status PENDING  Incomplete  MRSA PCR Screening     Status: None   Collection Time: 11/04/2013  6:50 AM  Result Value Ref Range Status   MRSA  by PCR NEGATIVE NEGATIVE Final    Comment:        The GeneXpert MRSA Assay (FDA approved for NASAL specimens only), is one component of a comprehensive MRSA colonization surveillance program. It is not intended to diagnose MRSA infection nor to guide or monitor treatment for MRSA infections.   Body fluid culture     Status: None (Preliminary result)   Collection Time: 11/14/2013 12:45 PM  Result Value Ref Range Status   Specimen Description FLUID RIGHT PLEURAL  Final   Special Requests Normal  Final   Gram Stain   Final    MODERATE WBC PRESENT, PREDOMINANTLY MONONUCLEAR NO ORGANISMS SEEN Performed at [facility]    Culture PENDING  Incomplete   Report Status PENDING  Incomplete    A:   Septic shock, GNR in blood/ c/w SBP - severe  P:   Monitor clinically  Anti-infectives    Start     Dose/Rate Route Frequency Ordered Stop   11/08/13 0315  vancomycin (VANCOCIN) IVPB 1000 mg/200 mL premix  Status:  Discontinued     1,000 mg200 mL/hr over 60 Minutes Intravenous Every 12 hours 11/02/2013 1505 11/08/13 1115   11/24/2013 1515  vancomycin (VANCOCIN) 1,500 mg in sodium chloride 0.9 % 500 mL IVPB     1,500 mg250 mL/hr over 120 Minutes Intravenous  Once 11/26/2013 1505 11/26/2013 1715   11/14/2013 1515  ceFEPIme (MAXIPIME) 1 g in dextrose 5 % 50 mL IVPB     1 g100 mL/hr over 30 Minutes Intravenous 3 times per day 11/26/2013 1506     11/03/2013 0330  vancomycin (VANCOCIN) IVPB 1000 mg/200 mL premix     1,000 mg200 mL/hr over 60 Minutes Intravenous  Once 11/28/2013 0316 11/04/2013 0534   11/25/2013 0330  cefTRIAXone (ROCEPHIN) 2 g in dextrose 5 % 50 mL IVPB     2 g100 mL/hr over 30 Minutes Intravenous  Once 11/10/2013 0316 11/03/2013 0424       ENDOCRINE A:   No acute issues  P:   Monitor glucose on Bmet  NEUROLOGIC A:   Hepatic encephalopathy on lactulose(improved) Pain Management   -  Mind is clear on lactulose  P:   RASS goal: 0 to -2 Lactulose  Consider adding back rifaximin  (recently stopped by GI); but MELD score needs to reduce to < 25 or ideally < 19 (per salix website) Thiamine, folate  fentanyl for sedation Monitor   FAMILY  - Updates: No family at  Bedside.  - Inter-disciplinary family meet or Palliative Care meeting due by:  Briefly done at bedside 11/08/13: . Code status clarified . Sisters x 2 discussed: they are open to talking to palliative care and has MAary Larach cell. They will call her when they feel so. For now full medical care except DNR when arrests. They are not wanting to give up but agree his qwuality of life is important. Their vision is for him to get better to get transplant     Code Status Orders        Start     Ordered   11/16/2013  [id number]  Limited resuscitation (code)   Continuous    Question Answer Comment  In the event of cardiac or respiratory ARREST: Perform CPR No   In the event of cardiac or respiratory ARREST: Perform Intubation/Mechanical Ventilation Yes   In the event of cardiac or respiratory ARREST: Perform Defibrillation or Cardioversion if indicated No   Antiarrhythmic drugs - Any drug used to treat arrhythmias Yes   Vasoactive drug - Any drug used to stabilize blood pressure Yes      March 12, 2013 0511

## 2013-11-09 NOTE — Progress Notes (Signed)
Patient ID: Xavier Valentine, male   DOB: 12/30/1960, 53 y.o.   MRN: [id number]   Instructed RN as to how to charge [place] shunt Reiterated orders to push 10x/shift daily  She has full understanding and new orders placed for same She will instruct others  plts lower at 40 today Wbc 33.5 Hg 7.5 TBili 4.3 Cr 1.53 ventilated  Still not good candidate for TIPS  Plan per CCM

## 2013-11-10 ENCOUNTER — Inpatient Hospital Stay (HOSPITAL_COMMUNITY): Payer: Medicaid - Out of State

## 2013-11-10 DIAGNOSIS — A419 Sepsis, unspecified organism: Secondary | ICD-10-CM

## 2013-11-10 LAB — URINE CULTURE
Colony Count: NO GROWTH
Culture: NO GROWTH

## 2013-11-10 LAB — CBC WITH DIFFERENTIAL/PLATELET
Basophils Absolute: 0 10*3/uL (ref 0.0–0.1)
Basophils Relative: 0 % (ref 0–1)
Eosinophils Absolute: 0.1 10*3/uL (ref 0.0–0.7)
Eosinophils Relative: 0 % (ref 0–5)
HCT: 20.2 % — ABNORMAL LOW (ref 39.0–52.0)
Hemoglobin: 6.9 g/dL — CL (ref 13.0–17.0)
Lymphocytes Relative: 12 % (ref 12–46)
Lymphs Abs: 1.6 10*3/uL (ref 0.7–4.0)
MCH: 30.7 pg (ref 26.0–34.0)
MCHC: 34.2 g/dL (ref 30.0–36.0)
MCV: 89.8 fL (ref 78.0–100.0)
Monocytes Absolute: 1.1 10*3/uL — ABNORMAL HIGH (ref 0.1–1.0)
Monocytes Relative: 8 % (ref 3–12)
Neutro Abs: 10.7 10*3/uL — ABNORMAL HIGH (ref 1.7–7.7)
Neutrophils Relative %: 79 % — ABNORMAL HIGH (ref 43–77)
Platelets: 26 10*3/uL — CL (ref 150–400)
RBC: 2.25 MIL/uL — ABNORMAL LOW (ref 4.22–5.81)
RDW: 15.8 % — ABNORMAL HIGH (ref 11.5–15.5)
WBC: 13.5 10*3/uL — ABNORMAL HIGH (ref 4.0–10.5)

## 2013-11-10 LAB — LACTIC ACID, PLASMA: Lactic Acid, Venous: 1.9 mmol/L (ref 0.5–2.2)

## 2013-11-10 LAB — BASIC METABOLIC PANEL WITH GFR
Anion gap: 10 (ref 5–15)
BUN: 44 mg/dL — ABNORMAL HIGH (ref 6–23)
CO2: 22 meq/L (ref 19–32)
Calcium: 8 mg/dL — ABNORMAL LOW (ref 8.4–10.5)
Chloride: 99 meq/L (ref 96–112)
Creatinine, Ser: 1.09 mg/dL (ref 0.50–1.35)
GFR calc Af Amer: 87 mL/min — ABNORMAL LOW
GFR calc non Af Amer: 75 mL/min — ABNORMAL LOW
Glucose, Bld: 78 mg/dL (ref 70–99)
Potassium: 4 meq/L (ref 3.7–5.3)
Sodium: 131 meq/L — ABNORMAL LOW (ref 137–147)

## 2013-11-10 LAB — BODY FLUID CULTURE: Culture: NO GROWTH

## 2013-11-10 LAB — CBC
HEMATOCRIT: 23.2 % — AB (ref 39.0–52.0)
Hemoglobin: 8.1 g/dL — ABNORMAL LOW (ref 13.0–17.0)
MCH: 30.7 pg (ref 26.0–34.0)
MCHC: 34.9 g/dL (ref 30.0–36.0)
MCV: 87.9 fL (ref 78.0–100.0)
Platelets: 28 10*3/uL — CL (ref 150–400)
RBC: 2.64 MIL/uL — ABNORMAL LOW (ref 4.22–5.81)
RDW: 15.3 % (ref 11.5–15.5)
WBC: 14.6 10*3/uL — AB (ref 4.0–10.5)

## 2013-11-10 LAB — GLUCOSE, CAPILLARY
GLUCOSE-CAPILLARY: 76 mg/dL (ref 70–99)
GLUCOSE-CAPILLARY: 79 mg/dL (ref 70–99)
GLUCOSE-CAPILLARY: 95 mg/dL (ref 70–99)
GLUCOSE-CAPILLARY: 86 mg/dL (ref 70–99)
Glucose-Capillary: 65 mg/dL — ABNORMAL LOW (ref 70–99)
Glucose-Capillary: 72 mg/dL (ref 70–99)
Glucose-Capillary: 81 mg/dL (ref 70–99)
Glucose-Capillary: <10 mg/dL — CL (ref 70–99)

## 2013-11-10 LAB — PROTIME-INR
INR: 3.31 — ABNORMAL HIGH (ref 0.00–1.49)
Prothrombin Time: 33.9 s — ABNORMAL HIGH (ref 11.6–15.2)

## 2013-11-10 LAB — CULTURE, BLOOD (ROUTINE X 2)

## 2013-11-10 LAB — PREPARE RBC (CROSSMATCH)

## 2013-11-10 LAB — PHOSPHORUS: Phosphorus: 3.4 mg/dL (ref 2.3–4.6)

## 2013-11-10 LAB — CORTISOL: CORTISOL PLASMA: 32 ug/dL

## 2013-11-10 LAB — PROCALCITONIN: Procalcitonin: 8.72 ng/mL

## 2013-11-10 MED ORDER — DEXTROSE 5 % IV SOLN
2.0000 g | INTRAVENOUS | Status: DC
  Administered 2013-11-10 – 2013-11-11 (×2): 2 g via INTRAVENOUS
  Filled 2013-11-10 (×3): qty 2

## 2013-11-10 MED ORDER — DEXTROSE 50 % IV SOLN
INTRAVENOUS | Status: AC
  Filled 2013-11-10: qty 50

## 2013-11-10 MED ORDER — SODIUM CHLORIDE 0.9 % IV SOLN: INTRAVENOUS | Status: DC | PRN

## 2013-11-10 MED ORDER — CETYLPYRIDINIUM CHLORIDE 0.05 % MT LIQD: 7.0000 mL | Freq: Two times a day (BID) | OROMUCOSAL | Status: DC

## 2013-11-10 MED ORDER — "THROMBI-PAD 3""X3"" EX PADS"
1.0000 | MEDICATED_PAD | Freq: Once | CUTANEOUS | Status: AC
  Administered 2013-11-10: 1 via TOPICAL
  Filled 2013-11-10: qty 1

## 2013-11-10 MED ORDER — ALBUMIN HUMAN 25 % IV SOLN
100.0000 g | Freq: Once | INTRAVENOUS | Status: AC
  Administered 2013-11-10: 100 g via INTRAVENOUS
  Filled 2013-11-10: qty 400

## 2013-11-10 MED ORDER — LACTULOSE ENEMA
300.0000 mL | Freq: Every day | ORAL | Status: DC
  Administered 2013-11-10: 300 mL via RECTAL
  Filled 2013-11-10: qty 300

## 2013-11-10 MED ORDER — DEXTROSE-NACL 5-0.9 % IV SOLN
INTRAVENOUS | Status: DC
  Administered 2013-11-10 – 2013-11-11 (×3): via INTRAVENOUS

## 2013-11-10 MED ORDER — DEXTROSE 50 % IV SOLN
25.0000 mL | Freq: Once | INTRAVENOUS | Status: AC | PRN
  Administered 2013-11-10: 25 mL via INTRAVENOUS

## 2013-11-10 MED ORDER — FENTANYL CITRATE 0.05 MG/ML IJ SOLN
12.5000 ug | INTRAMUSCULAR | Status: DC | PRN
  Administered 2013-11-10 – 2013-11-11 (×2): 25 ug via INTRAVENOUS
  Filled 2013-11-10 (×2): qty 2

## 2013-11-10 MED ORDER — LACTULOSE ENEMA
300.0000 mL | Freq: Two times a day (BID) | ORAL | Status: DC
  Administered 2013-11-10: 300 mL via RECTAL
  Filled 2013-11-10 (×4): qty 300

## 2013-11-10 MED ORDER — CHLORHEXIDINE GLUCONATE 0.12 % MT SOLN
15.0000 mL | Freq: Two times a day (BID) | OROMUCOSAL | Status: DC
Start: 2013-11-10 — End: 2013-11-11
  Administered 2013-11-10: 15 mL via OROMUCOSAL
  Filled 2013-11-10: qty 15

## 2013-11-10 MED ORDER — SODIUM CHLORIDE 0.9 % IV SOLN: Freq: Once | INTRAVENOUS | Status: DC

## 2013-11-10 NOTE — Progress Notes (Signed)
15cc's of Fentanyl wasted with Diona Fantihris McKeown RN.

## 2013-11-10 NOTE — Progress Notes (Signed)
Patient was only able to tolerate half-dose of lactulose enema administered due to increased discomfort.

## 2013-11-10 NOTE — Progress Notes (Signed)
eLink Physician-Brief Progress Note Patient Name: Xavier Valentine DOB: 05-01-1960 MRN: [id number]   Date of Service  11/10/2013  HPI/Events of Note  Hgb down to 6.9 from 7.5  eICU Interventions  Plan: Transfuse 1 unit pRBC Post-transfusion CBC, BMET        DETERDING,ELIZABETH 11/10/2013, 4:13 AM

## 2013-11-10 NOTE — Progress Notes (Signed)
CRITICAL VALUE ALERT  Critical value received:  Hgb 6.9, Plt 26  Date of notification:  11/10/2013  Time of notification:  04:00  Critical value read back:Yes.    Nurse who received alert:  Jae DireGinger Epling, RN  MD notified (1st page):  Deterding, E  Time of first page:  04:25  MD notified (2nd page):  Time of second page:  Responding MD:  Deterding, E  Time MD responded:  04:25

## 2013-11-10 NOTE — Progress Notes (Signed)
PULMONARY / CRITICAL CARE MEDICINE   Name: Xavier Valentine MRN: [id number] DOB: 08/17/1960    ADMISSION DATE:  11/25/2013 CONSULTATION DATE:  11/04/2013  REFERRING MD :  EDP  CHIEF COMPLAINT:  AMS  INITIAL PRESENTATION:    STUDIES:  Therapeutic thoracentesis 10/11, 10/12, 10/14  Paracentesis 11/6 >>>  SIGNIFICANT EVENTS: 10/10 - 10/26 Admission to [facility] for recurrent effusions and ascites.  10/22 - [place] shunt placement 11/6 intubated, ICU >> 11/9 extubated  SUBJECTIVE:  Pressors weaning, more awake, passing SBT   VITAL SIGNS: Temp:  [97.5 F (36.4 C)-98.8 F (37.1 C)] 97.5 F (36.4 C) (11/09 1147) Pulse Rate:  [70-121] 111 (11/09 1200) Resp:  [12-32] 17 (11/09 1200) BP: (82-135)/(50-78) 101/65 mmHg (11/09 1200) SpO2:  [100 %] 100 % (11/09 1200) Arterial Line BP: (101-159)/(41-66) 131/58 mmHg (11/09 1200) FiO2 (%):  [40 %] 40 % (11/09 1009) Weight:  [94.2 kg (207 lb 10.8 oz)] 94.2 kg (207 lb 10.8 oz) (11/09 0500) HEMODYNAMICS:   VENTILATOR SETTINGS: Vent Mode:  [-] PSV;CPAP FiO2 (%):  [40 %] 40 % Set Rate:  [18 bmp] 18 bmp Vt Set:  [600 mL] 600 mL PEEP:  [5 cmH20] 5 cmH20 Pressure Support:  [5 cmH20-10 cmH20] 5 cmH20 Plateau Pressure:  [16 cmH20-17 cmH20] 16 cmH20 INTAKE / OUTPUT:  Intake/Output Summary (Last 24 hours) at 11/10/13 1217 Last data filed at 11/10/13 1100  Gross per 24 hour  Intake 2215.25 ml  Output   3030 ml  Net -814.75 ml    PHYSICAL EXAMINATION: General:  Follows commands on vent HEENT: NCAT, OP dry PULM: few crackles bialterally, diminished R CV: RRR, no mgr Ab: BS+, mildly distended, [place] shunt Ext: massive edema Neuro: awake, following commands  LABS: PULMONARY  Recent Labs Lab 11/02/2013 1343 11/24/2013 1615 11/23/2013 1651 11/14/2013 2214 11/08/13 1331 11/09/13 1125  PHART 7.465*  --  7.295* 7.281* 7.325* 7.370  PCO2ART 22.9*  --  32.3* 33.4* 32.5* 34.8*  PO2ART 54.0*  --  67.0* 122.0* 59.0* 62.0*  HCO3 16.5*  --  15.7*  15.7* 16.9* 20.2  TCO2 17  --  17 17 18 21   O2SAT 90.0 64.2 91.0 98.0 88.0 91.0    CBC  Recent Labs Lab 11/08/13 0251 11/09/13 0421 11/10/13 0328  HGB 8.9* 7.5* 6.9*  HCT 26.6* 21.5* 20.2*  WBC 42.9* 33.5* 13.5*  PLT 58* 40* 26*    COAGULATION  Recent Labs Lab 11/20/2013 0425 11/08/13 1110 11/09/13 0421 11/10/13 0328  INR 2.48* 6.17* 4.24* 3.31*    CARDIAC    Recent Labs Lab 12/01/2013 0157 11/08/13 1110 11/09/13 0421  TROPONINI <0.30 <0.30 <0.30    Recent Labs Lab 11/06/13 2354  PROBNP 170.7*     CHEMISTRY  Recent Labs Lab 11/05/13 1143 11/06/13 2354  12/01/2013 0500 11/08/13 0251 11/08/13 1110 11/09/13 0005 11/09/13 0421 11/09/13 1205 11/10/13 0700  NA 131* 129*  --   --  130*  --   --  128*  --  131*  K 3.6 4.6  --   --  4.4  --   --  4.2  --  4.0  CL 95* 95*  --   --  95*  --   --  95*  --  99  CO2 26 23  --   --  14*  --   --  20  --  22  GLUCOSE 111* 128*  --   --  115*  --   --  129*  --  78  BUN 24* 28*  --   --  38*  --   --  40*  --  44*  CREATININE 1.07 1.28  --   --  2.01*  --   --  1.53*  --  1.09  CALCIUM 7.1* 7.5*  --   --  6.8*  --   --  7.5*  --  8.0*  MG  --   --   --  1.7 1.4*  --   --  1.6  --   --   PHOS  --   --   < > 4.4 5.0* 5.2* 4.6 4.3 4.0  --   < > = values in this interval not displayed. Estimated Creatinine Clearance: 89.8 mL/min (by C-G formula based on Cr of 1.09).   LIVER  Recent Labs Lab 11/05/13 1143 11/06/13 2354 11/25/2013 0425 11/19/2013 1325 11/08/13 1110 11/09/13 0421 11/10/13 0328  AST 42* 42*  --   --   --  27  --   ALT 22 21  --   --   --  16  --   ALKPHOS 80 89  --   --   --  65  --   BILITOT 3.5* 3.7*  --   --   --  4.3*  --   PROT 6.3 6.5  --  5.2*  --  5.5*  --   ALBUMIN 1.9* 1.6*  --   --   --  2.8*  --   INR  --   --  2.48*  --  6.17* 4.24* 3.31*     INFECTIOUS  Recent Labs Lab 11/08/13 1110 11/08/13 1746 11/09/13 0421 11/09/13 1009 11/10/13 0327 11/10/13 0328  LATICACIDVEN   --  3.4*  --  2.8* 1.9  --   PROCALCITON 27.00  --  17.55  --   --  8.72     ENDOCRINE CBG (last 3)   Recent Labs  11/09/13 2354 11/10/13 0418 11/10/13 0752  GLUCAP 81 72 76         IMAGING x48h Dg Chest Port 1 View  11/10/2013   CLINICAL DATA:  Respiratory failure.  Intubated patient.  EXAM: PORTABLE CHEST - 1 VIEW  COMPARISON:  Chest 11/09/2013 and 11/08/2013.  FINDINGS: Support tubes and lines are unchanged. Right worse than left effusions and airspace disease also appear unchanged. There is no pneumothorax. Heart size is normal.  IMPRESSION: No change in right greater left pleural effusions and extensive bilateral airspace disease.   Electronically Signed   By: Drusilla Kannerhomas  Dalessio M.D.   On: 11/10/2013 06:42   Dg Chest Port 1 View  11/09/2013   CLINICAL DATA:  Acute respiratory failure  EXAM: PORTABLE CHEST - 1 VIEW  COMPARISON:  11/08/2013  FINDINGS: Endotracheal tube tip is 1.8 cm above the carina. Left IJ central venous catheter is noted with tip over the cavoatrial junction. Right-sided presumed IJ approach line terminates over the cavoatrial junction. Increased hazy perihilar airspace opacity is noted with stable posteriorly layering large right and small left pleural effusions. Mild enlargement of the cardiomediastinal silhouette is reidentified.  IMPRESSION: Increased bilateral perihilar airspace opacities suggesting superimposed edema on underlying moderate right and small left pleural effusions.   Electronically Signed   By: Christiana PellantGretchen  Green M.D.   On: 11/09/2013 07:46       ASSESSMENT / PLAN:  PULMONARY A: Acute hypoxic respiratory failure 2nd to effusion and septic shock. Intubated 11/06/2013 Large R pleural effusion - SB pleuritis 11/15/2013  P:  Extubate SLP eval  CARDIOVASCULAR A:  Septic shock - improving  P:  Pressors for MAP > 55; wean neo Stop vasopressin Give albumin  RENAL Lab Results  Component Value Date   CREATININE 1.09 11/10/2013    CREATININE 1.53* 11/09/2013   CREATININE 2.01* 11/08/2013   CREATININE 1.07 11/05/2013    A:   BPH Hyponatremia improving AKI> resolving P:   Follow Bmet Trend lactic acid Dc  tamsulosin  GASTROINTESTINAL A:  ETOH/hepC cirrhosis  Ascites MELD score: 18 at admit.   P:   Lactulose  No Xifaxan due to high MELD Hold tube feedings SLP eval  HEMATOLOGIC  Recent Labs  11/09/13 0421 11/10/13 0328  HGB 7.5* 6.9*   Lab Results  Component Value Date   INR 3.31* 11/10/2013   INR 4.24* 11/09/2013   INR 6.17* 11/08/2013    A:   Coagulopathy r/t cirrhosis Thrombocytopenia Anemia of critical illness   - all worsening due to sepsis  P:  Monitor closely PRBC for hgb < 7gm%   INFECTIOUS  A:   Septic shock, GNR in blood/ c/w SBP - severe 11/6 Blood >> e.coli 11/6 body fluid > neg 11/7 urine > neg P:   Change to ceftriaxone, plan 14 day course   ENDOCRINE A:   No acute issues  P:   Monitor glucose on Bmet  NEUROLOGIC A:   Hepatic encephalopathy on lactulose(improved) Pain Management   P:   RASS goal: 0 Lactulose enema Consider adding back rifaximin  Thiamine, folate Monitor   FAMILY  - Updates: Updated sister at length, she confirms full code..  CC time 40 minutes  Heber CarolinaBrent Amarie Viles, MD [facility] Pager: [phone] Cell: [phone] If no response, call [phone]

## 2013-11-10 NOTE — Progress Notes (Signed)
UR Completed.  Xavier Valentine Jane [phone] 01/20/2013  

## 2013-11-10 NOTE — Procedures (Signed)
Extubation Procedure Note  Patient Details:   Name: Xavier Valentine DOB: 1960-10-21 MRN: [id number]   Airway Documentation:     Evaluation  O2 sats: stable throughout  Complications: No apparent complications Patient did tolerate procedure well. Bilateral Breath Sounds: Rhonchi Suctioning: Airway No   Patient extubated to 4L nasal cannula.  Positive cuff leak noted.  Sats currently 100%.  Vitals are stable.  Patient is still confused and not speaking.  Unable to perform incentive spirometry.  No evidence of stridor.  RT will continue to monitor.   Ledell NossBrown, Timmy Bubeck N 11/10/2013, 12:00 PM

## 2013-11-10 NOTE — Progress Notes (Signed)
Pressed [place] Shunt 10 times at the direction of IR Medcal Team, Beckey Downingam Turpin assisted me in this.  Jacqulyn Canehristopher Scott Scotlyn Mccranie RN, BSN, CCRN

## 2013-11-10 NOTE — Progress Notes (Signed)
Referring Physician(s): CCM  Subjective:  Cirrhosis Hepatic hydrothorax Recurrent ascites [place] shunt placed 10/23/2013 Returned to hospital 11/5 Hypoxia Immediately intubated and thoracentesis CCM admitted pt---following closely Extubated this afternoon Meld score 26 today  Allergies: Bee venom  Medications: Prior to Admission medications   Medication Sig Start Date End Date Taking? Authorizing Provider  albuterol (PROVENTIL HFA;VENTOLIN HFA) 108 (90 BASE) MCG/ACT inhaler Inhale 2 puffs into the lungs daily as needed for wheezing or shortness of breath. 11/05/13  Yes Doris Cheadleeepak Advani, MD  clonazePAM (KLONOPIN) 0.5 MG tablet Take 1 tablet (0.5 mg total) by mouth daily as needed for anxiety. 11/05/13  Yes Doris Cheadleeepak Advani, MD  docusate sodium (COLACE) 100 MG capsule Take 1 capsule (100 mg total) by mouth 2 (two) times daily as needed for mild constipation. Hold if you have diarrhea. 11/05/13  Yes Doris Cheadleeepak Advani, MD  fentaNYL (DURAGESIC - DOSED MCG/HR) 25 MCG/HR patch Place 1 patch (25 mcg total) onto the skin every 3 (three) days. 11/05/13  Yes Doris Cheadleeepak Advani, MD  folic acid (FOLVITE) 1 MG tablet Take 1 tablet (1 mg total) by mouth daily. 11/05/13  Yes Doris Cheadleeepak Advani, MD  furosemide (LASIX) 40 MG tablet Take 1 tablet (40 mg total) by mouth daily. 11/05/13  Yes Doris Cheadleeepak Advani, MD  lactulose (CHRONULAC) 10 GM/15ML solution Take 45 mLs (30 g total) by mouth daily. 11/05/13  Yes Doris Cheadleeepak Advani, MD  Multiple Vitamin (MULTIVITAMIN WITH MINERALS) TABS tablet Take 1 tablet by mouth daily. 09/09/13  Yes Clydia LlanoMutaz Elmahi, MD  omeprazole (PRILOSEC) 40 MG capsule Take 1 capsule (40 mg total) by mouth daily. 11/05/13  Yes Doris Cheadleeepak Advani, MD  oxyCODONE (ROXICODONE) 5 MG immediate release tablet Take 1 tablet (5 mg total) by mouth every 4 (four) hours as needed for severe pain. 10/27/13  Yes Osvaldo ShipperGokul Krishnan, MD  polyethylene glycol (MIRALAX / GLYCOLAX) packet Take 17 g by mouth daily as needed for moderate constipation.  11/05/13  Yes Doris Cheadleeepak Advani, MD  potassium chloride SA (K-DUR,KLOR-CON) 20 MEQ tablet Take 1 tablet (20 mEq total) by mouth daily. 11/05/13  Yes Doris Cheadleeepak Advani, MD  saccharomyces boulardii (FLORASTOR) 250 MG capsule Take 1 capsule (250 mg total) by mouth 2 (two) times daily. For 5 days 11/05/13  Yes Doris Cheadleeepak Advani, MD  tamsulosin (FLOMAX) 0.4 MG CAPS capsule Take 1 capsule (0.4 mg total) by mouth daily. 11/05/13  Yes Doris Cheadleeepak Advani, MD  thiamine 100 MG tablet Take 1 tablet (100 mg total) by mouth daily. 09/09/13  Yes Clydia LlanoMutaz Elmahi, MD  amoxicillin (AMOXIL) 500 MG capsule Take 1 capsule (500 mg total) by mouth every 8 (eight) hours. For 5 more days 10/27/13   Osvaldo ShipperGokul Krishnan, MD    Review of Systems  Psychiatric/Behavioral: Positive for agitation.    Vital Signs: BP 101/65 mmHg  Pulse 111  Temp(Src) 97.5 F (36.4 C) (Axillary)  Resp 17  Ht 5\' 11"  (1.803 m)  Wt 94.2 kg (207 lb 10.8 oz)  BMI 28.98 kg/m2  SpO2 100%  Physical Exam  Pulmonary/Chest: He is in respiratory distress. He has wheezes.  Abdominal: He exhibits distension.  Able to palpate [place] shunt Rt side Charged 10 x with RN RN has good understanding of this procedure  Musculoskeletal: He exhibits edema.  Nursing note and vitals reviewed.   Imaging: Dg Chest 2 View  11/03/2013   CLINICAL DATA:  Shortness of breath.  History of liver failure.  EXAM: CHEST  2 VIEW  COMPARISON:  10/16/2013  FINDINGS: Large right pleural effusion with minimal  aeration of the right upper lung, increasing since previous study. Consolidation in the right lung. Left lung is clear. Heart size is obscured by the right pleural effusion but appears normal. No pulmonary vascular congestion. Small left pleural effusion. Shunt tubing demonstrated in the right chest.  IMPRESSION: Large and increasing right pleural effusion. Small left pleural effusion.   Electronically Signed   By: Burman NievesWilliam  Stevens M.D.   On: 2013/02/04 00:16   Dg Chest Port 1 View  11/10/2013    CLINICAL DATA:  Respiratory failure.  Intubated patient.  EXAM: PORTABLE CHEST - 1 VIEW  COMPARISON:  Chest 11/09/2013 and 11/08/2013.  FINDINGS: Support tubes and lines are unchanged. Right worse than left effusions and airspace disease also appear unchanged. There is no pneumothorax. Heart size is normal.  IMPRESSION: No change in right greater left pleural effusions and extensive bilateral airspace disease.   Electronically Signed   By: Drusilla Kannerhomas  Dalessio M.D.   On: 11/10/2013 06:42   Dg Chest Port 1 View  11/09/2013   CLINICAL DATA:  Acute respiratory failure  EXAM: PORTABLE CHEST - 1 VIEW  COMPARISON:  11/08/2013  FINDINGS: Endotracheal tube tip is 1.8 cm above the carina. Left IJ central venous catheter is noted with tip over the cavoatrial junction. Right-sided presumed IJ approach line terminates over the cavoatrial junction. Increased hazy perihilar airspace opacity is noted with stable posteriorly layering large right and small left pleural effusions. Mild enlargement of the cardiomediastinal silhouette is reidentified.  IMPRESSION: Increased bilateral perihilar airspace opacities suggesting superimposed edema on underlying moderate right and small left pleural effusions.   Electronically Signed   By: Christiana PellantGretchen  Green M.D.   On: 11/09/2013 07:46   Dg Chest Port 1 View  11/08/2013   CLINICAL DATA:  Pleural effusion.  EXAM: PORTABLE CHEST - 1 VIEW  COMPARISON:  2013/02/04  FINDINGS: Endotracheal tube terminates between the clavicular heads and carina, unchanged from yesterday. Left IJ catheter is in stable position. Right-sided [place] shunt in stable position (visualized portions). Orogastric tube reaches the stomach.  Large right pleural effusion. Although the apical component appears decreased, this is likely from differences in patient positioning. A retrocardiac opacity with air bronchograms is again noted. No pneumothorax.  IMPRESSION: 1. Unchanged positioning of tubes and lines. 2. Unchanged large  right pleural effusion. 3. Retrocardiac atelectasis or pneumonia.   Electronically Signed   By: Tiburcio PeaJonathan  Watts M.D.   On: 11/08/2013 07:27   Dg Chest Port 1 View  11/25/2013   CLINICAL DATA:  53 year old male status post emergent intubation, a declining oxygenation. Initial encounter.  EXAM: PORTABLE CHEST - 1 VIEW  COMPARISON:  1249 hr the same day and earlier.  FINDINGS: Portable AP semi upright view at 1549 hrs. Intubated. Endotracheal tube tip in the midline projects 2 cm above the carina. Stable right chest catheter. Left IJ central line placed, tip at the cavoatrial junction level. Enteric tube placed, courses to the left abdomen and tip not included.  Larger lung volumes. Large right pleural effusion re- identified. Mildly increased left perihilar opacity. No pneumothorax.  IMPRESSION: 1. Intubated, endotracheal tube in good position. 2. Left IJ central line placed, tip at the cavoatrial junction level. 3. Enteric tube placed, courses to the left abdomen tip not included. 4. Mildly increased patchy left perihilar opacity. Large right pleural effusion.   Electronically Signed   By: Augusto GambleLee  Hall M.D.   On: 2013/02/04 16:13   Dg Chest Port 1 View  11/21/2013   CLINICAL DATA:  Difficulty breathing. Status post right-sided thoracentesis  EXAM: PORTABLE CHEST - 1 VIEW  COMPARISON:  2013/11/27  FINDINGS: Right effusion is considerably smaller status post thoracentesis. There is consolidation throughout much of the right mid and lower lung zones with some layering effusion remaining on the right. The left lung is clear. Heart size and pulmonary vascularity are normal. Central catheter tip is at cavoatrial junction. No pneumothorax. No adenopathy.  IMPRESSION: No pneumothorax. Moderate right effusion remains, although effusion is considerably smaller. There remains consolidation throughout much of the right lung, although there has been improved aeration on the right middle and lower lobes following  thoracentesis procedure. Left lung remains clear.   Electronically Signed   By: Bretta Bang M.D.   On: 11-27-13 13:04    Labs:  CBC:  Recent Labs  11/08/13 0251 11/09/13 0421 11/10/13 0328 11/10/13 1225  WBC 42.9* 33.5* 13.5* 14.6*  HGB 8.9* 7.5* 6.9* 8.1*  HCT 26.6* 21.5* 20.2* 23.2*  PLT 58* 40* 26* 28*    COAGS:  Recent Labs  10/26/13 0500 10/26/13 1147 10/27/13 0310 2013/11/27 0157 11-27-13 0425 11/08/13 1110 11/09/13 0421 11/10/13 0328  INR 3.32* 2.89* 3.18*  --  2.48* 6.17* 4.24* 3.31*  APTT 54* 48* 47* 48*  --   --   --   --     BMP:  Recent Labs  11/06/13 2354 11/08/13 0251 11/09/13 0421 11/10/13 0700  NA 129* 130* 128* 131*  K 4.6 4.4 4.2 4.0  CL 95* 95* 95* 99  CO2 23 14* 20 22  GLUCOSE 128* 115* 129* 78  BUN 28* 38* 40* 44*  CALCIUM 7.5* 6.8* 7.5* 8.0*  CREATININE 1.28 2.01* 1.53* 1.09  GFRNONAA 61* 36* 50* 75*  GFRAA 71* 41* 57* 87*    LIVER FUNCTION TESTS:  Recent Labs  10/23/13 0519 10/24/13 0824 11/05/13 1143 11/06/13 2354 11/27/13 1325 11/09/13 0421  BILITOT 2.3*  --  3.5* 3.7*  --  4.3*  AST 58*  --  42* 42*  --  27  ALT 39  --  22 21  --  16  ALKPHOS 110  --  80 89  --  65  PROT 6.5  --  6.3 6.5 5.2* 5.5*  ALBUMIN 1.7*  1.6* 1.8* 1.9* 1.6*  --  2.8*    Assessment and Plan:  Hepatic hydrothorax Cirrhosis  [place] shunt placed 10/22 Extubated today Not responsive MELD 26 today NOT TIPs candidate as of yet Will report to Dr Loreta Ave    I spent a total of 15 minutes face to face in clinical consultation/evaluation, greater than 50% of which was counseling/coordinating care for [facility] shunt  Signed: Sharnae Winfree A 11/10/2013, 12:58 PM

## 2013-11-10 NOTE — Progress Notes (Signed)
[facility] PCCM  Long discussion with family, they state he was of sound mind recently.  They want to continue full support for now but don't want chest compressions in the setting of cardiac arrest.  They would want to put him back on the ventilator if necessary.  I advised that he is quite ill from his advanced liver disease and his overall prognosis is poor.  Heber CarolinaBrent McQuaid, MD  PCCM Pager: [phone] Cell: [phone] If no response, call [phone]

## 2013-11-10 NOTE — Progress Notes (Signed)
Hypoglycemic Event  CBG: 65  Treatment: D50 IV 25 mL  Symptoms: None  Follow-up CBG: Time:1749 CBG Result:95  Possible Reasons for Event: Inadequate meal intake  Comments/MD notified: Dr. Delton CoombesByrum via Huntley DecSara RN in e-Link notified.    Hermelinda MedicusIrekia Jared Cahn RN   Remember to initiate Hypoglycemia Order Set & complete

## 2013-11-11 ENCOUNTER — Other Ambulatory Visit: Payer: Self-pay

## 2013-11-11 ENCOUNTER — Inpatient Hospital Stay (HOSPITAL_COMMUNITY): Payer: Medicaid - Out of State

## 2013-11-11 DIAGNOSIS — J9601 Acute respiratory failure with hypoxia: Secondary | ICD-10-CM | POA: Insufficient documentation

## 2013-11-11 LAB — POCT I-STAT 3, ART BLOOD GAS (G3+)
ACID-BASE DEFICIT: 2 mmol/L (ref 0.0–2.0)
BICARBONATE: 24.4 meq/L — AB (ref 20.0–24.0)
O2 SAT: 97 %
TCO2: 26 mmol/L (ref 0–100)
pCO2 arterial: 51.9 mmHg — ABNORMAL HIGH (ref 35.0–45.0)
pH, Arterial: 7.279 — ABNORMAL LOW (ref 7.350–7.450)
pO2, Arterial: 99 mmHg (ref 80.0–100.0)

## 2013-11-11 LAB — TYPE AND SCREEN
ABO/RH(D): A POS
Antibody Screen: NEGATIVE
Unit division: 0

## 2013-11-11 LAB — GLUCOSE, CAPILLARY
GLUCOSE-CAPILLARY: 69 mg/dL — AB (ref 70–99)
Glucose-Capillary: 123 mg/dL — ABNORMAL HIGH (ref 70–99)
Glucose-Capillary: 71 mg/dL (ref 70–99)
Glucose-Capillary: 74 mg/dL (ref 70–99)
Glucose-Capillary: 77 mg/dL (ref 70–99)
Glucose-Capillary: 80 mg/dL (ref 70–99)
Glucose-Capillary: 84 mg/dL (ref 70–99)
Glucose-Capillary: 98 mg/dL (ref 70–99)

## 2013-11-11 LAB — BLOOD GAS, ARTERIAL
ACID-BASE DEFICIT: 2.2 mmol/L — AB (ref 0.0–2.0)
BICARBONATE: 22.5 meq/L (ref 20.0–24.0)
Drawn by: 252031
FIO2: 0.6 %
MECHVT: 600 mL
O2 SAT: 95.7 %
PEEP: 5 cmH2O
Patient temperature: 98.6
RATE: 16 resp/min
TCO2: 23.8 mmol/L (ref 0–100)
pCO2 arterial: 41.4 mmHg (ref 35.0–45.0)
pH, Arterial: 7.354 (ref 7.350–7.450)
pO2, Arterial: 82.1 mmHg (ref 80.0–100.0)

## 2013-11-11 LAB — CBC WITH DIFFERENTIAL/PLATELET
Basophils Absolute: 0 10*3/uL (ref 0.0–0.1)
Basophils Relative: 0 % (ref 0–1)
EOS PCT: 1 % (ref 0–5)
Eosinophils Absolute: 0.1 10*3/uL (ref 0.0–0.7)
HEMATOCRIT: 21.8 % — AB (ref 39.0–52.0)
Hemoglobin: 7.4 g/dL — ABNORMAL LOW (ref 13.0–17.0)
LYMPHS ABS: 1 10*3/uL (ref 0.7–4.0)
LYMPHS PCT: 11 % — AB (ref 12–46)
MCH: 30 pg (ref 26.0–34.0)
MCHC: 33.9 g/dL (ref 30.0–36.0)
MCV: 88.3 fL (ref 78.0–100.0)
MONO ABS: 0.9 10*3/uL (ref 0.1–1.0)
MONOS PCT: 10 % (ref 3–12)
Neutro Abs: 7.5 10*3/uL (ref 1.7–7.7)
Neutrophils Relative %: 79 % — ABNORMAL HIGH (ref 43–77)
Platelets: 25 10*3/uL — CL (ref 150–400)
RBC: 2.47 MIL/uL — AB (ref 4.22–5.81)
RDW: 15.7 % — ABNORMAL HIGH (ref 11.5–15.5)
WBC: 9.5 10*3/uL (ref 4.0–10.5)

## 2013-11-11 LAB — MAGNESIUM: Magnesium: 1.9 mg/dL (ref 1.5–2.5)

## 2013-11-11 LAB — BASIC METABOLIC PANEL
Anion gap: 10 (ref 5–15)
BUN: 40 mg/dL — ABNORMAL HIGH (ref 6–23)
CO2: 24 meq/L (ref 19–32)
CREATININE: 1.03 mg/dL (ref 0.50–1.35)
Calcium: 8.8 mg/dL (ref 8.4–10.5)
Chloride: 106 mEq/L (ref 96–112)
GFR calc Af Amer: >90 mL/min (ref >90)
GFR calc non Af Amer: 80 mL/min — ABNORMAL LOW (ref >90)
GLUCOSE: 78 mg/dL (ref 70–99)
Potassium: 3.5 mEq/L — ABNORMAL LOW (ref 3.7–5.3)
Sodium: 140 mEq/L (ref 137–147)

## 2013-11-11 LAB — BODY FLUID CULTURE
Culture: NO GROWTH
SPECIAL REQUESTS: NORMAL

## 2013-11-11 LAB — PHOSPHORUS: Phosphorus: 3.1 mg/dL (ref 2.3–4.6)

## 2013-11-11 MED ORDER — DEXTROSE 50 % IV SOLN
INTRAVENOUS | Status: AC
  Filled 2013-11-11: qty 50

## 2013-11-11 MED ORDER — CETYLPYRIDINIUM CHLORIDE 0.05 % MT LIQD
7.0000 mL | Freq: Four times a day (QID) | OROMUCOSAL | Status: DC
  Administered 2013-11-11 – 2013-11-12 (×3): 7 mL via OROMUCOSAL

## 2013-11-11 MED ORDER — FENTANYL BOLUS VIA INFUSION
50.0000 ug | INTRAVENOUS | Status: DC | PRN
  Administered 2013-11-11: 50 ug via INTRAVENOUS
  Administered 2013-11-11: 100 ug via INTRAVENOUS
  Administered 2013-11-11: 50 ug via INTRAVENOUS
  Administered 2013-11-12 (×3): 100 ug via INTRAVENOUS
  Administered 2013-11-12: 50 ug via INTRAVENOUS
  Filled 2013-11-11: qty 100

## 2013-11-11 MED ORDER — ETOMIDATE 2 MG/ML IV SOLN
0.3000 mg/kg | Freq: Once | INTRAVENOUS | Status: AC
  Administered 2013-11-11: 20 mg via INTRAVENOUS

## 2013-11-11 MED ORDER — LORAZEPAM 2 MG/ML IJ SOLN
INTRAMUSCULAR | Status: AC
Start: 2013-11-11 — End: 2013-11-11
  Administered 2013-11-11: 1 mg
  Filled 2013-11-11: qty 1

## 2013-11-11 MED ORDER — PRO-STAT SUGAR FREE PO LIQD
30.0000 mL | Freq: Two times a day (BID) | ORAL | Status: AC
  Administered 2013-11-11 (×2): 30 mL
  Filled 2013-11-11 (×2): qty 30

## 2013-11-11 MED ORDER — MIDAZOLAM HCL 2 MG/2ML IJ SOLN
INTRAMUSCULAR | Status: AC
  Administered 2013-11-11: 2 mg
  Filled 2013-11-11: qty 2

## 2013-11-11 MED ORDER — LORAZEPAM 2 MG/ML IJ SOLN: 1.0000 mg | Freq: Once | INTRAMUSCULAR | Status: AC

## 2013-11-11 MED ORDER — FENTANYL CITRATE 0.05 MG/ML IJ SOLN: 50.0000 ug | Freq: Once | INTRAMUSCULAR | Status: AC

## 2013-11-11 MED ORDER — LACTULOSE 10 GM/15ML PO SOLN
30.0000 g | Freq: Three times a day (TID) | ORAL | Status: DC
  Administered 2013-11-11 – 2013-11-12 (×4): 30 g
  Filled 2013-11-11 (×9): qty 45

## 2013-11-11 MED ORDER — FENTANYL CITRATE 0.05 MG/ML IJ SOLN
INTRAMUSCULAR | Status: AC
  Administered 2013-11-11: 100 ug
  Filled 2013-11-11: qty 2

## 2013-11-11 MED ORDER — VITAL HIGH PROTEIN PO LIQD
1000.0000 mL | ORAL | Status: DC
  Administered 2013-11-11: 1000 mL
  Filled 2013-11-11 (×3): qty 1000

## 2013-11-11 MED ORDER — SODIUM CHLORIDE 0.9 % IV SOLN
0.0000 ug/h | INTRAVENOUS | Status: DC
  Administered 2013-11-11: 50 ug/h via INTRAVENOUS
  Administered 2013-11-12: 200 ug/h via INTRAVENOUS
  Filled 2013-11-11 (×2): qty 50

## 2013-11-11 MED ORDER — ALBUMIN HUMAN 25 % IV SOLN
50.0000 g | Freq: Once | INTRAVENOUS | Status: AC
  Administered 2013-11-11: 50 g via INTRAVENOUS
  Filled 2013-11-11: qty 200

## 2013-11-11 MED ORDER — DEXTROSE 50 % IV SOLN
25.0000 mL | Freq: Once | INTRAVENOUS | Status: AC | PRN
  Administered 2013-11-11: 25 mL via INTRAVENOUS

## 2013-11-11 MED ORDER — CHLORHEXIDINE GLUCONATE 0.12 % MT SOLN
15.0000 mL | Freq: Two times a day (BID) | OROMUCOSAL | Status: DC
  Administered 2013-11-11 (×2): 15 mL via OROMUCOSAL
  Filled 2013-11-11 (×3): qty 15

## 2013-11-11 MED ORDER — RIFAXIMIN 200 MG PO TABS
400.0000 mg | ORAL_TABLET | Freq: Three times a day (TID) | ORAL | Status: DC
  Administered 2013-11-11 – 2013-11-12 (×3): 400 mg via ORAL
  Filled 2013-11-11 (×6): qty 2

## 2013-11-11 NOTE — Progress Notes (Signed)
Referring Physician(s): CCM  Subjective:  Cirrhosis Hepatic hydrothorax [place] shunt placed 10/23/13 Pt readmitted for hypoxia/sepsis 11/5 Extubated 11/9 Re intubated 11/10 early am For family meeting for GOC plans this afternoon  Allergies: Bee venom  Medications: Prior to Admission medications   Medication Sig Start Date End Date Taking? Authorizing Provider  albuterol (PROVENTIL HFA;VENTOLIN HFA) 108 (90 BASE) MCG/ACT inhaler Inhale 2 puffs into the lungs daily as needed for wheezing or shortness of breath. 11/05/13  Yes [name], MD  clonazePAM (KLONOPIN) 0.5 MG tablet Take 1 tablet (0.5 mg total) by mouth daily as needed for anxiety. 11/05/13  Yes [name], MD  docusate sodium (COLACE) 100 MG capsule Take 1 capsule (100 mg total) by mouth 2 (two) times daily as needed for mild constipation. Hold if you have diarrhea. 11/05/13  Yes [name], MD  fentaNYL (DURAGESIC - DOSED MCG/HR) 25 MCG/HR patch Place 1 patch (25 mcg total) onto the skin every 3 (three) days. 11/05/13  Yes [name], MD  folic acid (FOLVITE) 1 MG tablet Take 1 tablet (1 mg total) by mouth daily. 11/05/13  Yes [name], MD  furosemide (LASIX) 40 MG tablet Take 1 tablet (40 mg total) by mouth daily. 11/05/13  Yes [name], MD  lactulose (CHRONULAC) 10 GM/15ML solution Take 45 mLs (30 g total) by mouth daily. 11/05/13  Yes [name], MD  Multiple Vitamin (MULTIVITAMIN WITH MINERALS) TABS tablet Take 1 tablet by mouth daily. 09/09/13  Yes Clydia Llano, MD  omeprazole (PRILOSEC) 40 MG capsule Take 1 capsule (40 mg total) by mouth daily. 11/05/13  Yes [name], MD  oxyCODONE (ROXICODONE) 5 MG immediate release tablet Take 1 tablet (5 mg total) by mouth every 4 (four) hours as needed for severe pain. 10/27/13  Yes Osvaldo Shipper, MD  polyethylene glycol (MIRALAX / GLYCOLAX) packet Take 17 g by mouth daily as needed for moderate constipation. 11/05/13  Yes [name], MD  potassium  chloride SA (K-DUR,KLOR-CON) 20 MEQ tablet Take 1 tablet (20 mEq total) by mouth daily. 11/05/13  Yes [name], MD  saccharomyces boulardii (FLORASTOR) 250 MG capsule Take 1 capsule (250 mg total) by mouth 2 (two) times daily. For 5 days 11/05/13  Yes [name], MD  tamsulosin (FLOMAX) 0.4 MG CAPS capsule Take 1 capsule (0.4 mg total) by mouth daily. 11/05/13  Yes [name], MD  thiamine 100 MG tablet Take 1 tablet (100 mg total) by mouth daily. 09/09/13  Yes Clydia Llano, MD  amoxicillin (AMOXIL) 500 MG capsule Take 1 capsule (500 mg total) by mouth every 8 (eight) hours. For 5 more days 10/27/13   Osvaldo Shipper, MD    Review of Systems  Vital Signs: BP 90/53 mmHg  Pulse 102  Temp(Src) 97.8 F (36.6 C) (Oral)  Resp 29  Ht 5\' 11"  (1.803 m)  Wt 90.7 kg (199 lb 15.3 oz)  BMI 27.90 kg/m2  SpO2 92%  Physical Exam  Pulmonary/Chest: He is in respiratory distress. He has wheezes.  ventilated  Abdominal: He exhibits distension.  Musculoskeletal: He exhibits edema.    Imaging: Portable Chest Xray  11/11/2013   CLINICAL DATA:  Status post intubation.  Shortness of breath.  EXAM: PORTABLE CHEST - 1 VIEW  COMPARISON:  Single view of the chest 11/10/2013.  FINDINGS: Endotracheal tube is in place with tip in good position at the level of the clavicular heads. Support apparatus is otherwise unchanged. Large bilateral pleural effusions and extensive airspace disease, worse on the right,  do not appear notably changed. No pneumothorax.  IMPRESSION: ET tube is in good position.  No change in large bilateral pleural effusions and airspace disease, worse on the right.   Electronically Signed   By: Drusilla Kannerhomas  Dalessio M.D.   On: 11/11/2013 03:57   Dg Chest Port 1 View  11/10/2013   CLINICAL DATA:  Respiratory failure.  Intubated patient.  EXAM: PORTABLE CHEST - 1 VIEW  COMPARISON:  Chest 11/09/2013 and 11/08/2013.  FINDINGS: Support tubes and lines are unchanged. Right worse than left effusions and  airspace disease also appear unchanged. There is no pneumothorax. Heart size is normal.  IMPRESSION: No change in right greater left pleural effusions and extensive bilateral airspace disease.   Electronically Signed   By: Drusilla Kannerhomas  Dalessio M.D.   On: 11/10/2013 06:42   Dg Chest Port 1 View  11/09/2013   CLINICAL DATA:  Acute respiratory failure  EXAM: PORTABLE CHEST - 1 VIEW  COMPARISON:  11/08/2013  FINDINGS: Endotracheal tube tip is 1.8 cm above the carina. Left IJ central venous catheter is noted with tip over the cavoatrial junction. Right-sided presumed IJ approach line terminates over the cavoatrial junction. Increased hazy perihilar airspace opacity is noted with stable posteriorly layering large right and small left pleural effusions. Mild enlargement of the cardiomediastinal silhouette is reidentified.  IMPRESSION: Increased bilateral perihilar airspace opacities suggesting superimposed edema on underlying moderate right and small left pleural effusions.   Electronically Signed   By: Christiana PellantGretchen  Green M.D.   On: 11/09/2013 07:46   Dg Chest Port 1 View  11/08/2013   CLINICAL DATA:  Pleural effusion.  EXAM: PORTABLE CHEST - 1 VIEW  COMPARISON:  14-Apr-2013  FINDINGS: Endotracheal tube terminates between the clavicular heads and carina, unchanged from yesterday. Left IJ catheter is in stable position. Right-sided [place] shunt in stable position (visualized portions). Orogastric tube reaches the stomach.  Large right pleural effusion. Although the apical component appears decreased, this is likely from differences in patient positioning. A retrocardiac opacity with air bronchograms is again noted. No pneumothorax.  IMPRESSION: 1. Unchanged positioning of tubes and lines. 2. Unchanged large right pleural effusion. 3. Retrocardiac atelectasis or pneumonia.   Electronically Signed   By: Tiburcio PeaJonathan  Watts M.D.   On: 11/08/2013 07:27   Dg Chest Port 1 View  12/01/2013   CLINICAL DATA:  53 year old male status  post emergent intubation, a declining oxygenation. Initial encounter.  EXAM: PORTABLE CHEST - 1 VIEW  COMPARISON:  1249 hr the same day and earlier.  FINDINGS: Portable AP semi upright view at 1549 hrs. Intubated. Endotracheal tube tip in the midline projects 2 cm above the carina. Stable right chest catheter. Left IJ central line placed, tip at the cavoatrial junction level. Enteric tube placed, courses to the left abdomen and tip not included.  Larger lung volumes. Large right pleural effusion re- identified. Mildly increased left perihilar opacity. No pneumothorax.  IMPRESSION: 1. Intubated, endotracheal tube in good position. 2. Left IJ central line placed, tip at the cavoatrial junction level. 3. Enteric tube placed, courses to the left abdomen tip not included. 4. Mildly increased patchy left perihilar opacity. Large right pleural effusion.   Electronically Signed   By: Augusto GambleLee  Hall M.D.   On: 14-Apr-2013 16:13   Dg Chest Port 1 View  11/28/2013   CLINICAL DATA:  Difficulty breathing. Status post right-sided thoracentesis  EXAM: PORTABLE CHEST - 1 VIEW  COMPARISON:  November 07, 2013  FINDINGS: Right effusion is considerably smaller status post  thoracentesis. There is consolidation throughout much of the right mid and lower lung zones with some layering effusion remaining on the right. The left lung is clear. Heart size and pulmonary vascularity are normal. Central catheter tip is at cavoatrial junction. No pneumothorax. No adenopathy.  IMPRESSION: No pneumothorax. Moderate right effusion remains, although effusion is considerably smaller. There remains consolidation throughout much of the right lung, although there has been improved aeration on the right middle and lower lobes following thoracentesis procedure. Left lung remains clear.   Electronically Signed   By: Bretta BangWilliam  Woodruff M.D.   On: 04-06-13 13:04   Dg Abd Portable 1v  11/11/2013   CLINICAL DATA:  NG tube placement.  EXAM: PORTABLE ABDOMEN - 1  VIEW  COMPARISON:  None.  FINDINGS: NG tube tip is in the distal stomach.  IMPRESSION: As above.   Electronically Signed   By: Drusilla Kannerhomas  Dalessio M.D.   On: 11/11/2013 04:25    Labs:  CBC:  Recent Labs  11/09/13 0421 11/10/13 0328 11/10/13 1225 11/11/13 0204  WBC 33.5* 13.5* 14.6* 9.5  HGB 7.5* 6.9* 8.1* 7.4*  HCT 21.5* 20.2* 23.2* 21.8*  PLT 40* 26* 28* 25*    COAGS:  Recent Labs  10/26/13 0500 10/26/13 1147 10/27/13 0310 09-28-2013 0157 09-28-2013 0425 11/08/13 1110 11/09/13 0421 11/10/13 0328  INR 3.32* 2.89* 3.18*  --  2.48* 6.17* 4.24* 3.31*  APTT 54* 48* 47* 48*  --   --   --   --     BMP:  Recent Labs  11/08/13 0251 11/09/13 0421 11/10/13 0700 11/11/13 0204  NA 130* 128* 131* 140  K 4.4 4.2 4.0 3.5*  CL 95* 95* 99 106  CO2 14* 20 22 24   GLUCOSE 115* 129* 78 78  BUN 38* 40* 44* 40*  CALCIUM 6.8* 7.5* 8.0* 8.8  CREATININE 2.01* 1.53* 1.09 1.03  GFRNONAA 36* 50* 75* 80*  GFRAA 41* 57* 87* >90    LIVER FUNCTION TESTS:  Recent Labs  10/23/13 0519 10/24/13 0824 11/05/13 1143 11/06/13 2354 09-28-2013 1325 11/09/13 0421  BILITOT 2.3*  --  3.5* 3.7*  --  4.3*  AST 58*  --  42* 42*  --  27  ALT 39  --  22 21  --  16  ALKPHOS 110  --  80 89  --  65  PROT 6.5  --  6.3 6.5 5.2* 5.5*  ALBUMIN 1.7*  1.6* 1.8* 1.9* 1.6*  --  2.8*    Assessment and Plan:  Hepatic hydrothorax [place] shunt placed 10/22 RNs keeping charge nicely 2-3x/day CXR reveals B pleural effusions R worse than L Consider thoracentesis? Family meeting planned for today    I spent a total of 15 minutes face to face in clinical consultation/evaluation, greater than 50% of which was counseling/[facility]inating care [facility]  Signed: Odetta Forness A 11/11/2013, 12:52 PM

## 2013-11-11 NOTE — Progress Notes (Signed)
NUTRITION FOLLOW UP  Intervention:    Initiate TF via OGT with Vital High Protein at 25 ml/h and Prostat 30 ml BID on day 1; on day 2, d/c Prostat and increase to goal rate of 80 ml/h (1920 ml per day) to provide 1920 kcals, 168 gm protein, 1605 ml free water daily.  Recommend start Prokinetic agent, such as Reglan.  Nutrition Dx:   Inadequate oral intake related to inability to eat as evidenced by NPO status, ongoing.  Goal:   Intake to meet >90% of estimated nutrition needs. Unmet.  Monitor:   TF tolerance/adequacy, weight trend, labs, vent status.  Assessment:   53 year old 44male with PMH cirrhosis and recurrent pleural effusion presents with SOB and AMS 11/6. In ED noted to have recurrent large R effusion. PCCM asked to see for further eval and potential thoracentesis.  Per discussion with RN, CCM physician would like to resume TF. Previously had increased residuals > 600 ml. TF has been on hold since 11/8. May need a prokinetic, such as Reglan.  Patient is currently intubated on ventilator support MV: 8.2, 14.7 L/min Temp (24hrs), Avg:97.8 F (36.6 C), Min:97.5 F (36.4 C), Max:98.1 F (36.7 C)  Propofol: none  Height: Ht Readings from Last 1 Encounters:  October 26, 2013 5\' 11"  (1.803 m)    Weight Status:   Wt Readings from Last 1 Encounters:  11/11/13 199 lb 15.3 oz (90.7 kg)   11/08/13 207 lb 7.3 oz (94.1 kg)    Re-estimated needs:  Kcal: 1970 Protein: 130-150 gm Fluid: 2 L  Skin: abdomen and chest incisions  Diet Order: Diet NPO time specified   Intake/Output Summary (Last 24 hours) at 11/11/13 1009 Last data filed at 11/11/13 0900  Gross per 24 hour  Intake 2008.49 ml  Output   5405 ml  Net -3396.51 ml    Last BM: 11/10   Labs:   Recent Labs Lab 11/08/13 0251  11/09/13 0421 11/09/13 1205 11/10/13 0700 11/10/13 1205 11/11/13 0204  NA 130*  --  128*  --  131*  --  140  K 4.4  --  4.2  --  4.0  --  3.5*  CL 95*  --  95*  --  99  --  106   CO2 14*  --  20  --  22  --  24  BUN 38*  --  40*  --  44*  --  40*  CREATININE 2.01*  --  1.53*  --  1.09  --  1.03  CALCIUM 6.8*  --  7.5*  --  8.0*  --  8.8  MG 1.4*  --  1.6  --   --   --  1.9  PHOS 5.0*  < > 4.3 4.0  --  3.4 3.1  GLUCOSE 115*  --  129*  --  78  --  78  < > = values in this interval not displayed.  CBG (last 3)   Recent Labs  11/11/13 0334 11/11/13 0810 11/11/13 0851  GLUCAP 71 69* 80    Scheduled Meds: . antiseptic oral rinse  7 mL Mouth Rinse QID  . cefTRIAXone (ROCEPHIN)  IV  2 g Intravenous Q24H  . chlorhexidine  15 mL Mouth Rinse BID  . folic acid  1 mg Per Tube Daily  . lactulose  30 g Per Tube TID  . pantoprazole (PROTONIX) IV  40 mg Intravenous Q12H  . rifaximin  400 mg Oral 3 times per day  .  tamsulosin  0.4 mg Oral Daily  . thiamine  100 mg Per Tube Daily    Continuous Infusions: . dextrose 5 % and 0.9% NaCl 50 mL/hr at 11/10/13 2000  . fentaNYL infusion INTRAVENOUS Stopped (11/11/13 0950)  . norepinephrine (LEVOPHED) Adult infusion Stopped (11/10/13 [id number])  . phenylephrine (NEO-SYNEPHRINE) Adult infusion Stopped (11/10/13 1238)  . vasopressin (PITRESSIN) infusion - *FOR SHOCK* Stopped (11/10/13 1239)     Joaquin CourtsKimberly Harris, RD, LDN, CNSC Pager [phone] After Hours Pager [phone]

## 2013-11-11 NOTE — Progress Notes (Signed)
UR Completed.  Xavier Valentine Jane [phone] 11/11/2013  

## 2013-11-11 NOTE — Procedures (Signed)
Intubation Procedure Note Xavier DuskyRichard L Turley [id number] October 29, 1960  Procedure: Intubation Indications: Respiratory insufficiency  Procedure Details Consent: Unable to obtain consent because of altered level of consciousness. Time Out: Verified patient identification, verified procedure, site/side was marked, verified correct patient position, special equipment/implants available, medications/allergies/relevent history reviewed, required imaging and test results available.  Performed  Drugs 100mcg Fentanyl, 2mg  Versed, 20mg  Etomidate. DL x 1 with # 3 MAC blade. 7.5 tube passed through cords under direct visualization.  Evaluation Hemodynamic Status: BP stable throughout; O2 sats: stable throughout Patient's Current Condition: stable Complications: No apparent complications Patient did tolerate procedure well. Chest X-ray ordered to verify placement.  CXR: pending.  Glidescope used.   Rutherford Guysahul Desai, PA - [facility] Pgr: [phone]  or [phone] 11/11/2013, 3:07 AM  Attempted to reach family prior to intubation, message left.  I was present and supervised the entire procedure.  Alyson ReedyWesam G. Berna Gitto, M.D. [facility]. Pager: [phone]. After hours pager: [phone].

## 2013-11-11 NOTE — Progress Notes (Signed)
Hypoglycemic Event  CBG: 69  Treatment: D50 IV 25 mL  Symptoms: None  Follow-up CBG: WUJW:[id number]:[id number] CBG Result:80  Possible Reasons for Event: Inadequate meal intake  Comments/MD notified: Dr. Waynetta SandyWight notified    Hermelinda MedicusIrekia Calyn Sivils RN Remember to initiate Hypoglycemia Order Set & complete

## 2013-11-11 NOTE — Progress Notes (Signed)
   11/11/13 0157  Vent Select  Invasive or Noninvasive Noninvasive  Adult Vent Y  Adult Ventilator Settings  Vent Type Servo i  Vent Mode BIPAP  FiO2 (%) 60 %  Pressure Support 10 cmH20  PEEP 5 cmH20  Adult Ventilator Measurements  Peak Airway Pressure 11 L/min  Resp Rate Spontaneous 22 br/min  Resp Rate Total 22 br/min  Spont TV 534 mL  Measured Ve 8.1 mL  HOB> 30 Degrees Y  Adult Ventilator Alarms  Alarms On Y  Patient on NIV BIPAP 10/5. He tolertaes it very well at this time.

## 2013-11-11 NOTE — Progress Notes (Signed)
PULMONARY / CRITICAL CARE MEDICINE   Name: Xavier DuskyRichard L Sakamoto MRN: [id number] DOB: [birth year]    ADMISSION DATE:  11/02/2013 CONSULTATION DATE:  11/23/2013  REFERRING MD :  EDP  CHIEF COMPLAINT:  AMS  INITIAL PRESENTATION:    STUDIES:  Therapeutic thoracentesis 10/11, 10/12, 10/14  Paracentesis 11/6 >>>  SIGNIFICANT EVENTS: 10/10 - 10/26 [facility].  10/22 - [place] shunt placement 11/6 intubated, ICU >> 11/9 extubated 11/10 re-intubated  SUBJECTIVE:  reintubated for hypoxemia and encephalopathy last night  VITAL SIGNS: Temp:  [97.6 F (36.4 C)-98.1 F (36.7 C)] 97.8 F (36.6 C) (11/10 1212) Pulse Rate:  [76-118] 96 (11/10 1300) Resp:  [14-36] 17 (11/10 1300) BP: (81-147)/(49-70) 82/49 mmHg (11/10 1300) SpO2:  [80 %-100 %] 94 % (11/10 1300) Arterial Line BP: (78-154)/(34-62) 87/34 mmHg (11/10 1300) FiO2 (%):  [50 %-80 %] 50 % (11/10 0800) Weight:  [90.7 kg (199 lb 15.3 oz)] 90.7 kg (199 lb 15.3 oz) (11/10 0600) HEMODYNAMICS:   VENTILATOR SETTINGS: Vent Mode:  [-] PRVC FiO2 (%):  [50 %-80 %] 50 % Set Rate:  [16 bmp] 16 bmp Vt Set:  [600 mL] 600 mL PEEP:  [5 cmH20] 5 cmH20 Pressure Support:  [10 cmH20] 10 cmH20 Plateau Pressure:  [14 cmH20-17 cmH20] 17 cmH20 INTAKE / OUTPUT:  Intake/Output Summary (Last 24 hours) at 11/11/13 1423 Last data filed at 11/11/13 1100  Gross per 24 hour  Intake 1969.76 ml  Output   5280 ml  Net -3310.24 ml    PHYSICAL EXAMINATION: General:  Sedated on vent HEENT: NCAT, OP dry PULM: few crackles bialterally, diminished R CV: RRR, no mgr Ab: BS+, mildly distended, denver shunt Ext: massive edema Neuro: sedated, intermittently agitated  LABS: PULMONARY  Recent Labs Lab 11/30/2013 2214 11/08/13 1331 11/09/13 1125 11/11/13 0142 11/11/13 0330  PHART 7.281* 7.325* 7.370 7.279* 7.354  PCO2ART 33.4* 32.5* 34.8* 51.9* 41.4  PO2ART 122.0* 59.0* 62.0* 99.0 82.1  HCO3 15.7* 16.9* 20.2  24.4* 22.5  TCO2 17 18 21 26  23.8  O2SAT 98.0 88.0 91.0 97.0 95.7    CBC  Recent Labs Lab 11/10/13 0328 11/10/13 1225 11/11/13 0204  HGB 6.9* 8.1* 7.4*  HCT 20.2* 23.2* 21.8*  WBC 13.5* 14.6* 9.5  PLT 26* 28* 25*    COAGULATION  Recent Labs Lab 12/01/2013 0425 11/08/13 1110 11/09/13 0421 11/10/13 0328  INR 2.48* 6.17* 4.24* 3.31*    CARDIAC    Recent Labs Lab 11/27/2013 0157 11/08/13 1110 11/09/13 0421  TROPONINI <0.30 <0.30 <0.30    Recent Labs Lab 11/06/13 2354  PROBNP 170.7*     CHEMISTRY  Recent Labs Lab 11/06/13 2354  11/30/2013 0500 11/08/13 0251  11/09/13 0005 11/09/13 0421 11/09/13 1205 11/10/13 0700 11/10/13 1205 11/11/13 0204  NA 129*  --   --  130*  --   --  128*  --  131*  --  140  K 4.6  --   --  4.4  --   --  4.2  --  4.0  --  3.5*  CL 95*  --   --  95*  --   --  95*  --  99  --  106  CO2 23  --   --  14*  --   --  20  --  22  --  24  GLUCOSE 128*  --   --  115*  --   --  129*  --  78  --  78  BUN 28*  --   --  38*  --   --  40*  --  44*  --  40*  CREATININE 1.28  --   --  2.01*  --   --  1.53*  --  1.09  --  1.03  CALCIUM 7.5*  --   --  6.8*  --   --  7.5*  --  8.0*  --  8.8  MG  --   --  1.7 1.4*  --   --  1.6  --   --   --  1.9  PHOS  --   < > 4.4 5.0*  < > 4.6 4.3 4.0  --  3.4 3.1  < > = values in this interval not displayed. Estimated Creatinine Clearance: 93.4 mL/min (by C-G formula based on Cr of 1.03).   LIVER  Recent Labs Lab 11/05/13 1143 11/06/13 2354 11-25-13 0425 11/25/2013 1325 11/08/13 1110 11/09/13 0421 11/10/13 0328  AST 42* 42*  --   --   --  27  --   ALT 22 21  --   --   --  16  --   ALKPHOS 80 89  --   --   --  65  --   BILITOT 3.5* 3.7*  --   --   --  4.3*  --   PROT 6.3 6.5  --  5.2*  --  5.5*  --   ALBUMIN 1.9* 1.6*  --   --   --  2.8*  --   INR  --   --  2.48*  --  6.17* 4.24* 3.31*     INFECTIOUS  Recent Labs Lab 11/08/13 1110 11/08/13 1746 11/09/13 0421 11/09/13 1009 11/10/13 0327  11/10/13 0328  LATICACIDVEN  --  3.4*  --  2.8* 1.9  --   PROCALCITON 27.00  --  17.55  --   --  8.72     ENDOCRINE CBG (last 3)   Recent Labs  11/11/13 0810 11/11/13 0851 11/11/13 1210  GLUCAP 69* 80 74         IMAGING x48h Portable Chest Xray  11/11/2013   CLINICAL DATA:  Status post intubation.  Shortness of breath.  EXAM: PORTABLE CHEST - 1 VIEW  COMPARISON:  Single view of the chest 11/10/2013.  FINDINGS: Endotracheal tube is in place with tip in good position at the level of the clavicular heads. Support apparatus is otherwise unchanged. Large bilateral pleural effusions and extensive airspace disease, worse on the right, do not appear notably changed. No pneumothorax.  IMPRESSION: ET tube is in good position.  No change in large bilateral pleural effusions and airspace disease, worse on the right.   Electronically Signed   By: Drusilla Kanner M.D.   On: 11/11/2013 03:57   Dg Chest Port 1 View  11/10/2013   CLINICAL DATA:  Respiratory failure.  Intubated patient.  EXAM: PORTABLE CHEST - 1 VIEW  COMPARISON:  Chest 11/09/2013 and 11/08/2013.  FINDINGS: Support tubes and lines are unchanged. Right worse than left effusions and airspace disease also appear unchanged. There is no pneumothorax. Heart size is normal.  IMPRESSION: No change in right greater left pleural effusions and extensive bilateral airspace disease.   Electronically Signed   By: Drusilla Kanner M.D.   On: 11/10/2013 06:42   Dg Abd Portable 1v  11/11/2013   CLINICAL DATA:  NG tube placement.  EXAM: PORTABLE ABDOMEN - 1 VIEW  COMPARISON:  None.  FINDINGS: NG tube tip is in the distal stomach.  IMPRESSION: As above.   Electronically Signed   By: Drusilla Kannerhomas  Dalessio M.D.   On: 11/11/2013 04:25       ASSESSMENT / PLAN:  PULMONARY A: Acute hypoxic respiratory failure 2nd to effusion and septic shock. Intubated 11/22/2013 Large R pleural effusion - SB pleuritis 11/22/2013  P:   Continue full vent support  overnight, see discussion below SLP eval  CARDIOVASCULAR A:  Septic shock - resolved but mildly hypotensive 11/10 due in part to advance cirrhossi P:  No more pressors Give albumin  RENAL Lab Results  Component Value Date   CREATININE 1.03 11/11/2013   CREATININE 1.09 11/10/2013   CREATININE 1.53* 11/09/2013   CREATININE 1.07 11/05/2013    A:   BPH Hyponatremia improving AKI> resolving P:   Follow Bmet  GASTROINTESTINAL A:  Advanced ETOH/hepC cirrhosis with acute decompensation due to e.coli bacteremia Ascites s/p [place] Shunt  P:   Lactulose, add rifaximan Minimize sedating medications  HEMATOLOGIC  Recent Labs  11/10/13 1225 11/11/13 0204  HGB 8.1* 7.4*   Lab Results  Component Value Date   INR 3.31* 11/10/2013   INR 4.24* 11/09/2013   INR 6.17* 11/08/2013    A:   Coagulopathy r/t cirrhosis > stable Thrombocytopenia > stable Anemia of critical illness   P:  PRBC for hgb < 7gm%   INFECTIOUS  A:   Septic shock, GNR in blood/ c/w SBP - improved 11/6 Blood >> e.coli 11/6 body fluid > neg 11/7 urine > neg P:   Ceftriaxone, plan 14 day course total antibiotics   ENDOCRINE A:   No acute issues  P:   Monitor glucose on Bmet  NEUROLOGIC A:   Hepatic encephalopathy on lactulose > recurrent post extubation Pain Management   P:   RASS goal: 0 Lactulose enema Add back rifaximin  Try to use prn fentanyl only, no benzo; OK to add back fentanyl gtt if needed Thiamine, folate Monitor   FAMILY  - Updates: Updated sisters and mother at length on 11/10.  Apparently after they left yesterday they decided that they did not want him to go back on life support, but they failed to tell anyone this.  So unfortunately he was re-intubated last night.  They stated today that want to limit suffering and not prolong things here.  I explained that his brain is not damaged but his liver is severely damaged and his overall prognosis is poor.  So we will  treat the hepatic encephalopathy as best we can overnight and plan to extubate in the morning, one way extubation.  Do not add back pressors  CC time 45 minutes  Heber CarolinaBrent McQuaid, MD [place] [facility] Pager: [phone] Cell: [phone] If no response, call [phone]

## 2013-11-12 ENCOUNTER — Inpatient Hospital Stay (HOSPITAL_COMMUNITY): Payer: Medicaid - Out of State

## 2013-11-12 LAB — CBC WITH DIFFERENTIAL/PLATELET
BASOS ABS: 0 10*3/uL (ref 0.0–0.1)
Basophils Relative: 0 % (ref 0–1)
Eosinophils Absolute: 0.1 10*3/uL (ref 0.0–0.7)
Eosinophils Relative: 2 % (ref 0–5)
HEMATOCRIT: 22.2 % — AB (ref 39.0–52.0)
Hemoglobin: 7.4 g/dL — ABNORMAL LOW (ref 13.0–17.0)
Lymphocytes Relative: 17 % (ref 12–46)
Lymphs Abs: 0.9 10*3/uL (ref 0.7–4.0)
MCH: 29.8 pg (ref 26.0–34.0)
MCHC: 33.3 g/dL (ref 30.0–36.0)
MCV: 89.5 fL (ref 78.0–100.0)
MONO ABS: 0.8 10*3/uL (ref 0.1–1.0)
Monocytes Relative: 16 % — ABNORMAL HIGH (ref 3–12)
Neutro Abs: 3.5 10*3/uL (ref 1.7–7.7)
Neutrophils Relative %: 65 % (ref 43–77)
Platelets: 44 10*3/uL — ABNORMAL LOW (ref 150–400)
RBC: 2.48 MIL/uL — ABNORMAL LOW (ref 4.22–5.81)
RDW: 15.9 % — AB (ref 11.5–15.5)
WBC: 5.4 10*3/uL (ref 4.0–10.5)

## 2013-11-12 LAB — GLUCOSE, CAPILLARY
GLUCOSE-CAPILLARY: 117 mg/dL — AB (ref 70–99)
GLUCOSE-CAPILLARY: 127 mg/dL — AB (ref 70–99)
GLUCOSE-CAPILLARY: 134 mg/dL — AB (ref 70–99)

## 2013-11-12 LAB — BASIC METABOLIC PANEL
Anion gap: 9 (ref 5–15)
BUN: 40 mg/dL — AB (ref 6–23)
CO2: 23 meq/L (ref 19–32)
CREATININE: 1 mg/dL (ref 0.50–1.35)
Calcium: 8.7 mg/dL (ref 8.4–10.5)
Chloride: 113 mEq/L — ABNORMAL HIGH (ref 96–112)
GFR calc non Af Amer: 83 mL/min — ABNORMAL LOW (ref >90)
Glucose, Bld: 132 mg/dL — ABNORMAL HIGH (ref 70–99)
Potassium: 3.9 mEq/L (ref 3.7–5.3)
Sodium: 145 mEq/L (ref 137–147)

## 2013-11-12 LAB — MAGNESIUM: Magnesium: 2.1 mg/dL (ref 1.5–2.5)

## 2013-11-12 LAB — PHOSPHORUS: Phosphorus: 2.2 mg/dL — ABNORMAL LOW (ref 2.3–4.6)

## 2013-11-12 MED ORDER — LORAZEPAM 2 MG/ML IJ SOLN
INTRAMUSCULAR | Status: AC
  Filled 2013-11-12: qty 1

## 2013-11-12 MED ORDER — LORAZEPAM 2 MG/ML IJ SOLN
1.0000 mg | INTRAMUSCULAR | Status: DC | PRN
  Administered 2013-11-12 (×3): 2 mg via INTRAVENOUS
  Administered 2013-11-12: 1 mg via INTRAVENOUS
  Administered 2013-11-12: 2 mg via INTRAVENOUS
  Administered 2013-11-12: 1 mg via INTRAVENOUS
  Filled 2013-11-12 (×5): qty 1

## 2013-11-12 MED ORDER — FENTANYL CITRATE 0.05 MG/ML IJ SOLN
100.0000 ug | Freq: Once | INTRAMUSCULAR | Status: AC
  Administered 2013-11-12: 100 ug via INTRAVENOUS

## 2013-11-12 MED ORDER — SODIUM CHLORIDE 0.9 % IV SOLN
10.0000 mg/h | INTRAVENOUS | Status: DC
  Administered 2013-11-12: 10 mg/h via INTRAVENOUS
  Filled 2013-11-12: qty 10

## 2013-11-12 MED ORDER — MORPHINE SULFATE 2 MG/ML IJ SOLN
2.0000 mg | INTRAMUSCULAR | Status: DC | PRN
  Administered 2013-11-12 (×4): 4 mg via INTRAVENOUS
  Filled 2013-11-12 (×4): qty 2

## 2013-11-13 LAB — CULTURE, BLOOD (ROUTINE X 2): Culture: NO GROWTH

## 2013-11-13 NOTE — Discharge Summary (Signed)
NAME:  Xavier Valentine, Xavier Valentine                    ACCOUNT NO.:  MEDICAL RECORD NO.:  [id number]  LOCATION:                                 FACILITY:  PHYSICIAN:  Veto KempsUGLAS BRENT MCQUAID, MDDATE OF BIRTH:  09-29-1960  DATE OF ADMISSION: DATE OF DISCHARGE:  01/18/2013                              DISCHARGE SUMMARY   DEATH SUMMARY  CAUSE OF DEATH: 1. Septic shock. 2. Escherichia coli bacteremia. 3. Decompensated hepatitis C cirrhosis. 4. Hepatic encephalopathy.  HISTORY OF PRESENT ILLNESS:  This is a 53 year old male with past medical history significant for cirrhosis and recurrent pleural effusions who [facility], with shortness of breath and altered mental status.  He is noted to have a large right pleural effusion.  Pulmonary Critical Care Medicine was consulted.  PAST MEDICAL HISTORY:  See the admission H and P.  FAMILY HISTORY:  See the admission H and P.  SOCIAL HISTORY:  See the admission H and P.  PHYSICAL EXAMINATION:  On the day of admission, VITAL SIGNS:  On admission, temperature 101 Fahrenheit, heart rate 55, respirations 16, blood pressure 125/55, SpO2 93%. GENERAL:  Cachectic male in mild distress. NEUROLOGIC:  Alert and oriented but confused.  Follows commands. CARDIOVASCULAR:  Regular rate and rhythm. HEENT:  [place]/AT.  Pupils equal, round, reactive.  Sclerae jaundiced. CARDIOVASCULAR:  Tachy regular. LUNGS:  Mildly dyspneic, increased effort.  Breath sounds are diminished on the right. ABDOMEN:  Distended, fluctuant with ascites. MUSCULOSKELETAL:  No deformity. SKIN:  Jaundiced.  HOSPITAL COURSE:  The patient was admitted to the medical intensive care unit for further management of septic shock.  It was felt that the patient had a septic shock secondary to likely SBP.  He was noted to have E. coli bacteremia and had refractory shock requiring 3 vasopressors at one point for support of his blood pressure.  He  was maintained on mechanical ventilation during his stay in the intensive care unit.  On November the 9th, 2015, his vasopressor support had improved dramatically and he was able to be extubated.  However, not long after extubation, he did have worsening mental status.  This was felt to be secondary to hepatic encephalopathy.  This was treated with lactulose, enemas as well as rifaximin.  He did require repeat intubation.  At this point, we had a lengthy discussion with the patient's family.  They felt that because of his advanced cirrhosis and overall very poor prognosis in the setting of decompensated liver disease that it was not appropriate to continue with prolonged ICU support.  Therefore, on November 12, 2013, we withdrew ventilatory support and the patient was maintained on a fentanyl drip for comfort. The patient died peacefully with his family at the bedside.          ______________________________ Veto KempsUGLAS BRENT MCQUAID, MD     DBM/MEDQ  D:  01/18/2013  T:  11/13/2013  Job:  [id number]

## 2013-11-20 ENCOUNTER — Ambulatory Visit: Payer: Self-pay | Admitting: Physician Assistant

## 2013-12-02 NOTE — Plan of Care (Signed)
Problem: Consults Goal: Skin Care Protocol Initiated - if Braden Score 18 or less If consults are not indicated, leave blank or document N/A  Outcome: Completed/Met Date Met:  2013-12-03 Goal: Nutrition Consult-if indicated Outcome: Completed/Met Date Met:  12-03-2013 Goal: Diabetes Guidelines if Diabetic/Glucose > 140 If diabetic or lab glucose is > 140 mg/dl - Initiate Diabetes/Hyperglycemia Guidelines & Document Interventions  Outcome: Completed/Met Date Met:  12-03-2013

## 2013-12-02 NOTE — Progress Notes (Signed)
Chaplain responded to spiritual care consult for end of life. Chaplain introduced herself to family. Pt family expressed that they were being supported by their personal minister but appreciated chaplain presence. Pt family expressed that all that was left to do was "wait." Chaplain asesment is that family feels supported and does not need her support at this time. Page chaplain as needed.    07-24-2013 1300  Clinical Encounter Type  Visited With Patient and family together  Visit Type Spiritual support;Patient actively dying  Referral From Physician  Stress Factors  Family Stress Factors Loss  Kisa Fujii, Mayer MaskerCourtney F, Chaplain September 14, 2013 1:28 PM

## 2013-12-02 NOTE — Procedures (Signed)
Extubation Procedure Note  Patient Details:   Name: Xavier Valentine DOB: [birth year] MRN: [id number]   Airway Documentation:     Evaluation  O2 sats: stable throughout Complications: No apparent complications Patient did tolerate procedure well. Bilateral Breath Sounds: Rhonchi, Diminished Suctioning: Oral, Airway   Pt extubated to room air for comfort care measures. Pt Sp02 dropped to mid 60's post extubation. Pt aggitated and restless. RN at bedside. Pt not awake or alert, unable to speak at this time.  [name], [name] 05/31/2013, 11:00 AM

## 2013-12-02 NOTE — Progress Notes (Signed)
Wasted remaining 70 mls of Morphine drip into sink.  Witnessed by Hermelinda MedicusIrekia Baynes RN  Dan MakerJames Season Astacio RN

## 2013-12-02 NOTE — Progress Notes (Signed)
Pt deceased, asystole on the monitor.  Absence of breath and heart sounds, pupils fixed and dilated.  Time of death 1645, called and verified by Dan MakerJames Mylissa Lambe RN and Hermelinda MedicusIrekia Baynes RN.  [facility]  Family at bedside.

## 2013-12-02 NOTE — Progress Notes (Signed)
PULMONARY / CRITICAL CARE MEDICINE   Name: ARAFAT COCUZZA MRN: [id number] DOB: [birth year]    ADMISSION DATE:  2013/11/14 CONSULTATION DATE:  14-Nov-2013  REFERRING MD :  EDP  CHIEF COMPLAINT:  AMS  INITIAL PRESENTATION:    STUDIES:  Therapeutic thoracentesis 10/11, 10/12, 10/14  Paracentesis 11/6 >>>  SIGNIFICANT EVENTS: 10/10 - 10/26 [facility].  10/22 - [place] shunt placement 11/6 intubated, ICU >> 11/9 extubated 11/10 re-intubated, rifaximin added back 11/11 extubated for comfort/palliation  SUBJECTIVE:  Slightly more awake, rifaximin added back  VITAL SIGNS: Temp:  [97.6 F (36.4 C)-98 F (36.7 C)] 97.9 F (36.6 C) (11/11 0813) Pulse Rate:  [78-110] 110 (11/11 1230) Resp:  [14-35] 22 (11/11 1230) BP: (80-120)/(34-73) 100/59 mmHg (11/11 1000) SpO2:  [47 %-99 %] 47 % (11/11 1230) Arterial Line BP: (81-153)/(33-67) 141/55 mmHg (11/11 1200) FiO2 (%):  [40 %] 40 % (11/11 0853) Weight:  [88.7 kg (195 lb 8.8 oz)] 88.7 kg (195 lb 8.8 oz) (11/11 0500) HEMODYNAMICS:   VENTILATOR SETTINGS: Vent Mode:  [-] PRVC FiO2 (%):  [40 %] 40 % Set Rate:  [16 bmp] 16 bmp Vt Set:  [600 mL] 600 mL PEEP:  [5 cmH20] 5 cmH20 Plateau Pressure:  [21 cmH20-23 cmH20] 23 cmH20 INTAKE / OUTPUT:  Intake/Output Summary (Last 24 hours) at 11/27/2013 1354 Last data filed at 11/28/2013 1000  Gross per 24 hour  Intake 2003.63 ml  Output   2225 ml  Net -221.37 ml    PHYSICAL EXAMINATION: General:  Sedated on vent HEENT: NCAT, OP dry PULM: few crackles bialterally, diminished R CV: RRR, no mgr Ab: BS+, mildly distended, denver shunt Ext: warm, some edema Neuro: sedated, withdraws to pain  LABS: PULMONARY  Recent Labs Lab November 14, 2013 2214 11/08/13 1331 11/09/13 1125 11/11/13 0142 11/11/13 0330  PHART 7.281* 7.325* 7.370 7.279* 7.354  PCO2ART 33.4* 32.5* 34.8* 51.9* 41.4  PO2ART 122.0* 59.0* 62.0* 99.0 82.1  HCO3 15.7* 16.9* 20.2 24.4* 22.5   TCO2 17 18 21 26  23.8  O2SAT 98.0 88.0 91.0 97.0 95.7    CBC  Recent Labs Lab 11/10/13 1225 11/11/13 0204 11/20/2013 0400  HGB 8.1* 7.4* 7.4*  HCT 23.2* 21.8* 22.2*  WBC 14.6* 9.5 5.4  PLT 28* 25* 44*    COAGULATION  Recent Labs Lab 11/14/2013 0425 11/08/13 1110 11/09/13 0421 11/10/13 0328  INR 2.48* 6.17* 4.24* 3.31*    CARDIAC    Recent Labs Lab 14-Nov-2013 0157 11/08/13 1110 11/09/13 0421  TROPONINI <0.30 <0.30 <0.30    Recent Labs Lab 11/06/13 2354  PROBNP 170.7*     CHEMISTRY  Recent Labs Lab 2013-11-14 0500 11/08/13 0251  11/09/13 0421 11/09/13 1205 11/10/13 0700 11/10/13 1205 11/11/13 0204 11/07/2013 0400  NA  --  130*  --  128*  --  131*  --  140 145  K  --  4.4  --  4.2  --  4.0  --  3.5* 3.9  CL  --  95*  --  95*  --  99  --  106 113*  CO2  --  14*  --  20  --  22  --  24 23  GLUCOSE  --  115*  --  129*  --  78  --  78 132*  BUN  --  38*  --  40*  --  44*  --  40* 40*  CREATININE  --  2.01*  --  1.53*  --  1.09  --  1.03 1.00  CALCIUM  --  6.8*  --  7.5*  --  8.0*  --  8.8 8.7  MG 1.7 1.4*  --  1.6  --   --   --  1.9 2.1  PHOS 4.4 5.0*  < > 4.3 4.0  --  3.4 3.1 2.2*  < > = values in this interval not displayed. Estimated Creatinine Clearance: 88.9 mL/min (by C-G formula based on Cr of 1).   LIVER  Recent Labs Lab 11/06/13 2354 12/30/13 0425 12/30/13 1325 11/08/13 1110 11/09/13 0421 11/10/13 0328  AST 42*  --   --   --  27  --   ALT 21  --   --   --  16  --   ALKPHOS 89  --   --   --  65  --   BILITOT 3.7*  --   --   --  4.3*  --   PROT 6.5  --  5.2*  --  5.5*  --   ALBUMIN 1.6*  --   --   --  2.8*  --   INR  --  2.48*  --  6.17* 4.24* 3.31*     INFECTIOUS  Recent Labs Lab 11/08/13 1110 11/08/13 1746 11/09/13 0421 11/09/13 1009 11/10/13 0327 11/10/13 0328  LATICACIDVEN  --  3.4*  --  2.8* 1.9  --   PROCALCITON 27.00  --  17.55  --   --  8.72     ENDOCRINE CBG (last 3)   Recent Labs  11/10/2013 11/11/2013 0359  11/28/2013 0812  GLUCAP 134* 127* 117*         IMAGING x48h Dg Chest Port 1 View  11/08/2013   CLINICAL DATA:  Hypoxia  EXAM: PORTABLE CHEST - 1 VIEW  COMPARISON:  November 11, 2013  FINDINGS: Endotracheal tube tip is 3.8 cm above the carina. Nasogastric tube tip and side port are below the diaphragm. Left jugular catheter tip is in the superior vena cava. Right-sided central catheter tip is at the cavoatrial junction. No pneumothorax. There is cardiomegaly with bilateral effusions. There is widespread interstitial and alveolar edema.  IMPRESSION: Tube and catheter positions as described without pneumothorax. Persistent congestive heart failure without appreciable change. No new opacity.   Electronically Signed   By: Bretta BangWilliam  Woodruff M.D.   On: 11/10/2013 07:47   Portable Chest Xray  11/11/2013   CLINICAL DATA:  Status post intubation.  Shortness of breath.  EXAM: PORTABLE CHEST - 1 VIEW  COMPARISON:  Single view of the chest 11/10/2013.  FINDINGS: Endotracheal tube is in place with tip in good position at the level of the clavicular heads. Support apparatus is otherwise unchanged. Large bilateral pleural effusions and extensive airspace disease, worse on the right, do not appear notably changed. No pneumothorax.  IMPRESSION: ET tube is in good position.  No change in large bilateral pleural effusions and airspace disease, worse on the right.   Electronically Signed   By: Drusilla Kannerhomas  Dalessio M.D.   On: 11/11/2013 03:57   Dg Abd Portable 1v  11/11/2013   CLINICAL DATA:  NG tube placement.  EXAM: PORTABLE ABDOMEN - 1 VIEW  COMPARISON:  None.  FINDINGS: NG tube tip is in the distal stomach.  IMPRESSION: As above.   Electronically Signed   By: Drusilla Kannerhomas  Dalessio M.D.   On: 11/11/2013 04:25       ASSESSMENT / PLAN:  PULMONARY A: Acute hypoxic respiratory failure 2nd to effusion and septic shock. Intubated  11/09/2013 Large R pleural effusion - SB pleuritis 11/08/2013  P:   Extubate, comfort  measures SLP eval  CARDIOVASCULAR A:  Septic shock - resolved but mildly hypotensive 11/10 due in part to advance cirrhossi P:  No more pressors Give albumin  RENAL Lab Results  Component Value Date   CREATININE 1.00 Mar 01, 2013   CREATININE 1.03 11/11/2013   CREATININE 1.09 11/10/2013   CREATININE 1.07 11/05/2013    A:   BPH Hyponatremia improving AKI> resolving P:   No more labs  GASTROINTESTINAL A:  Advanced ETOH/hepC cirrhosis with acute decompensation due to e.coli bacteremia Ascites s/p [place] Shunt Hepatic encephalopathy > not improving  P:   D/C lactulose and rifaximin as they are not working Minimize sedating medications  HEMATOLOGIC  Recent Labs  11/11/13 0204 2013/12/18 0400  HGB 7.4* 7.4*   Lab Results  Component Value Date   INR 3.31* 11/10/2013   INR 4.24* 11/09/2013   INR 6.17* 11/08/2013    A:   Coagulopathy r/t cirrhosis > stable Thrombocytopenia > stable Anemia of critical illness   P:  No transfusions   INFECTIOUS  A:   Septic shock, GNR in blood/ c/w SBP - improved 11/6 Blood >> e.coli 11/6 body fluid > neg 11/7 urine > neg P:   D/c Ceftriaxone, comfort measures  ENDOCRINE A:   No acute issues  P:   Monitor glucose on Bmet  NEUROLOGIC A:   Hepatic encephalopathy on lactulose > recurrent post extubation Pain Management   P:   RASS goal: 0 Morphine gtt for comfort, prn ativan  FAMILY  - Updates: Updated sisters and mother at length on 11/11.  We have seen no improvement overnight.  Per our conversation yesterday we will withdraw support today  CC time 35 minutes  Heber CarolinaBrent McQuaid, MD [place] [facility] Pager: [phone] Cell: [phone] If no response, call [phone]

## 2013-12-02 DEATH — deceased

## 2015-07-31 IMAGING — CR DG CHEST 1V PORT
2 series · 2 of 2 positions shown · non-contrast
Comparison: 09/21/2013

CLINICAL DATA: Status post thoracentesis

EXAM:
PORTABLE CHEST - 1 VIEW

[AP (1 of 2)]
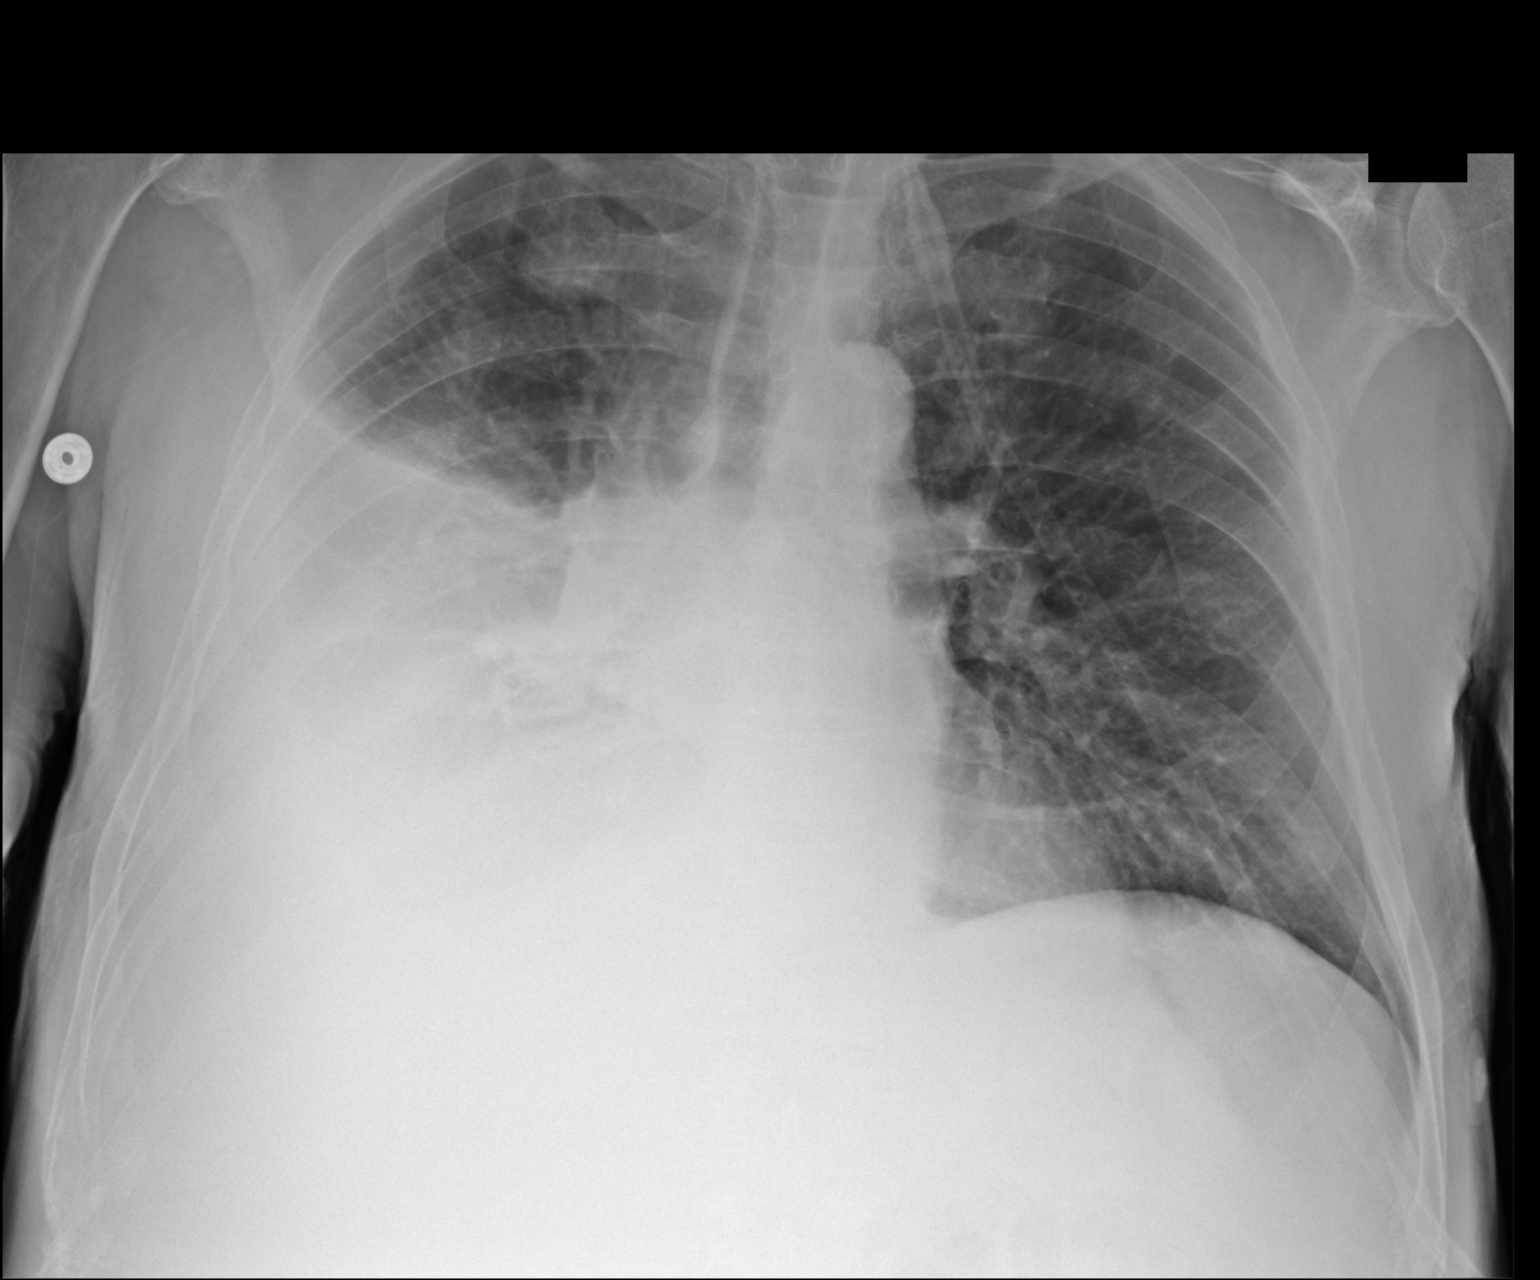

[AP (2 of 2)]
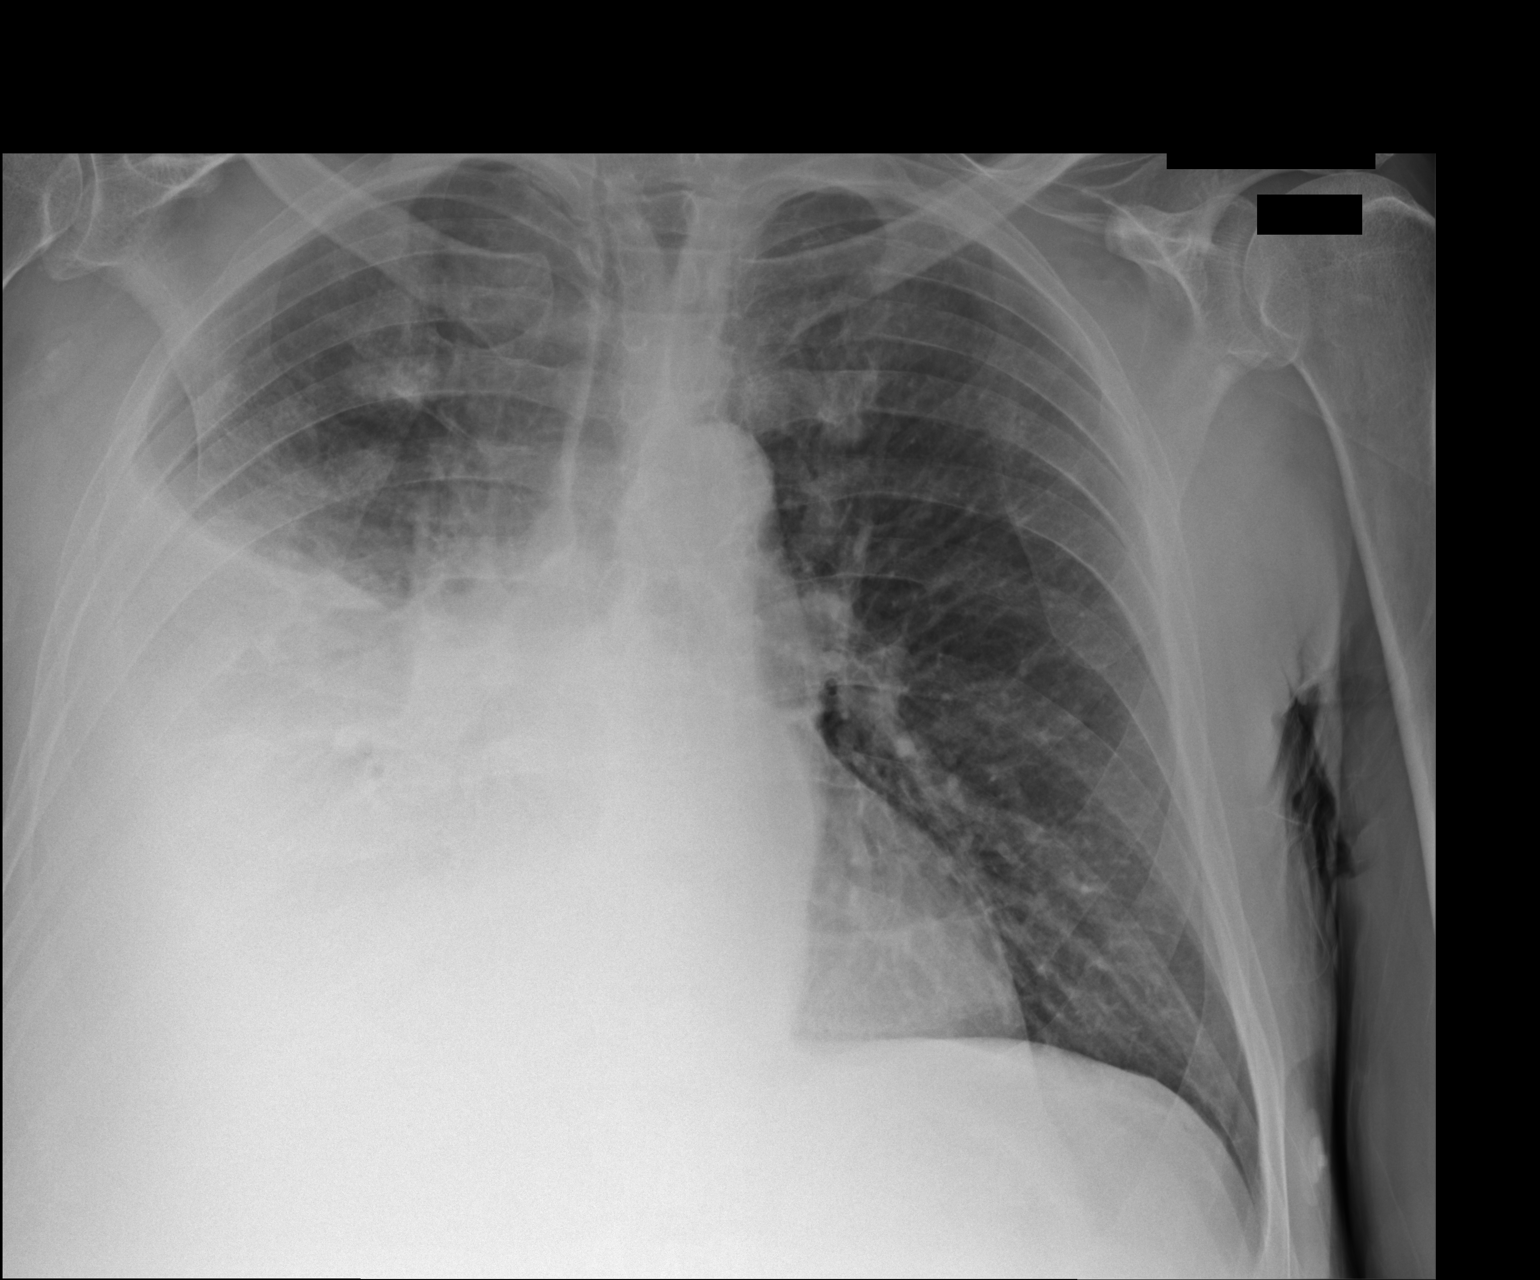

[2 of 2 positions shown; findings below may reference images not displayed]

FINDINGS: Large right pleural effusion. No left pleural effusion. No
pneumothorax. Stable cardiomediastinal silhouette. Unremarkable
osseous structures.
IMPRESSION: Large right pleural effusion.  No pneumothorax.

## 2015-08-02 IMAGING — CR DG CHEST 2V
2 series · 2 of 2 positions shown · non-contrast
Comparison: 09/27/2013

CLINICAL DATA: Right pleural effusion

EXAM:
CHEST  2 VIEW

[w chest pa]
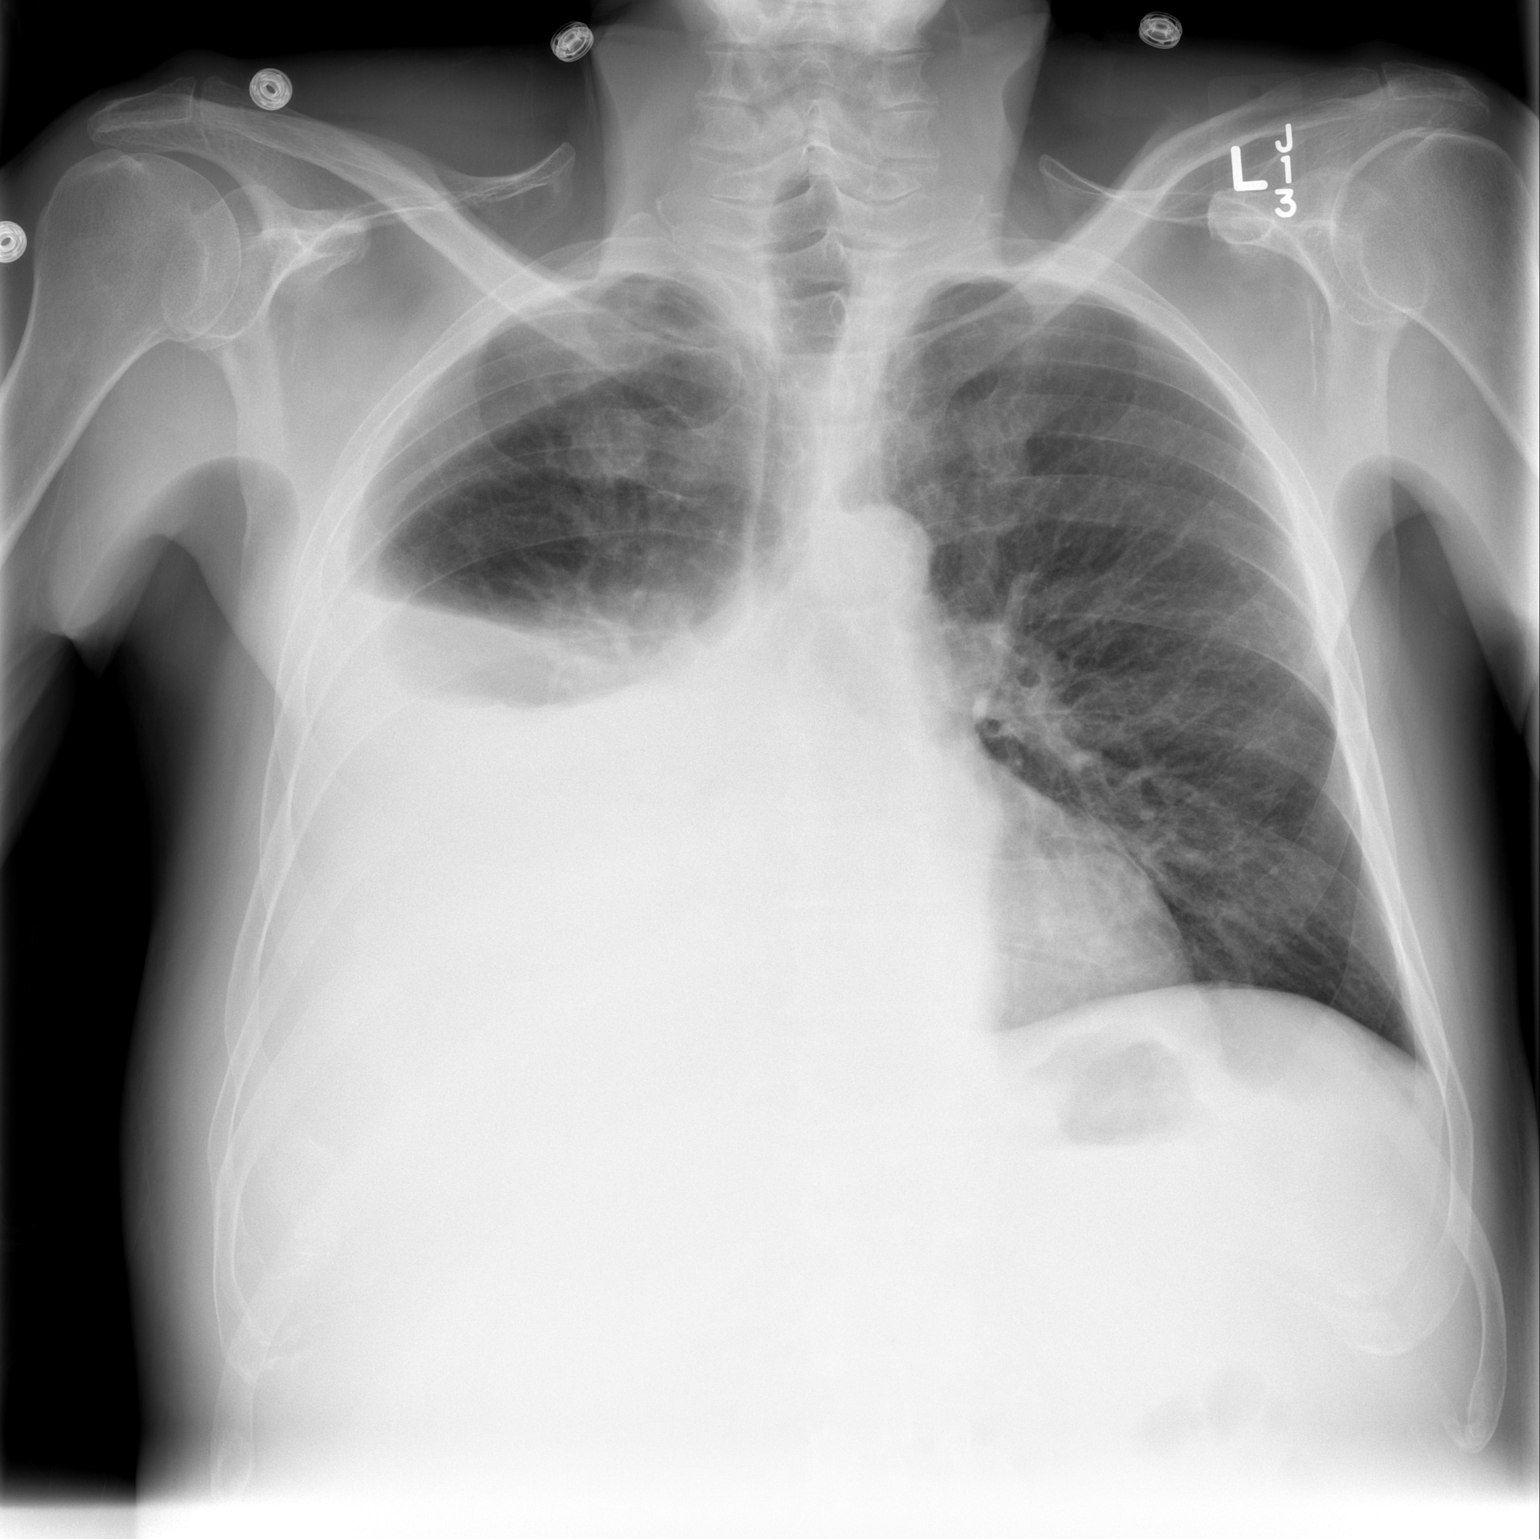

[w chest lat]
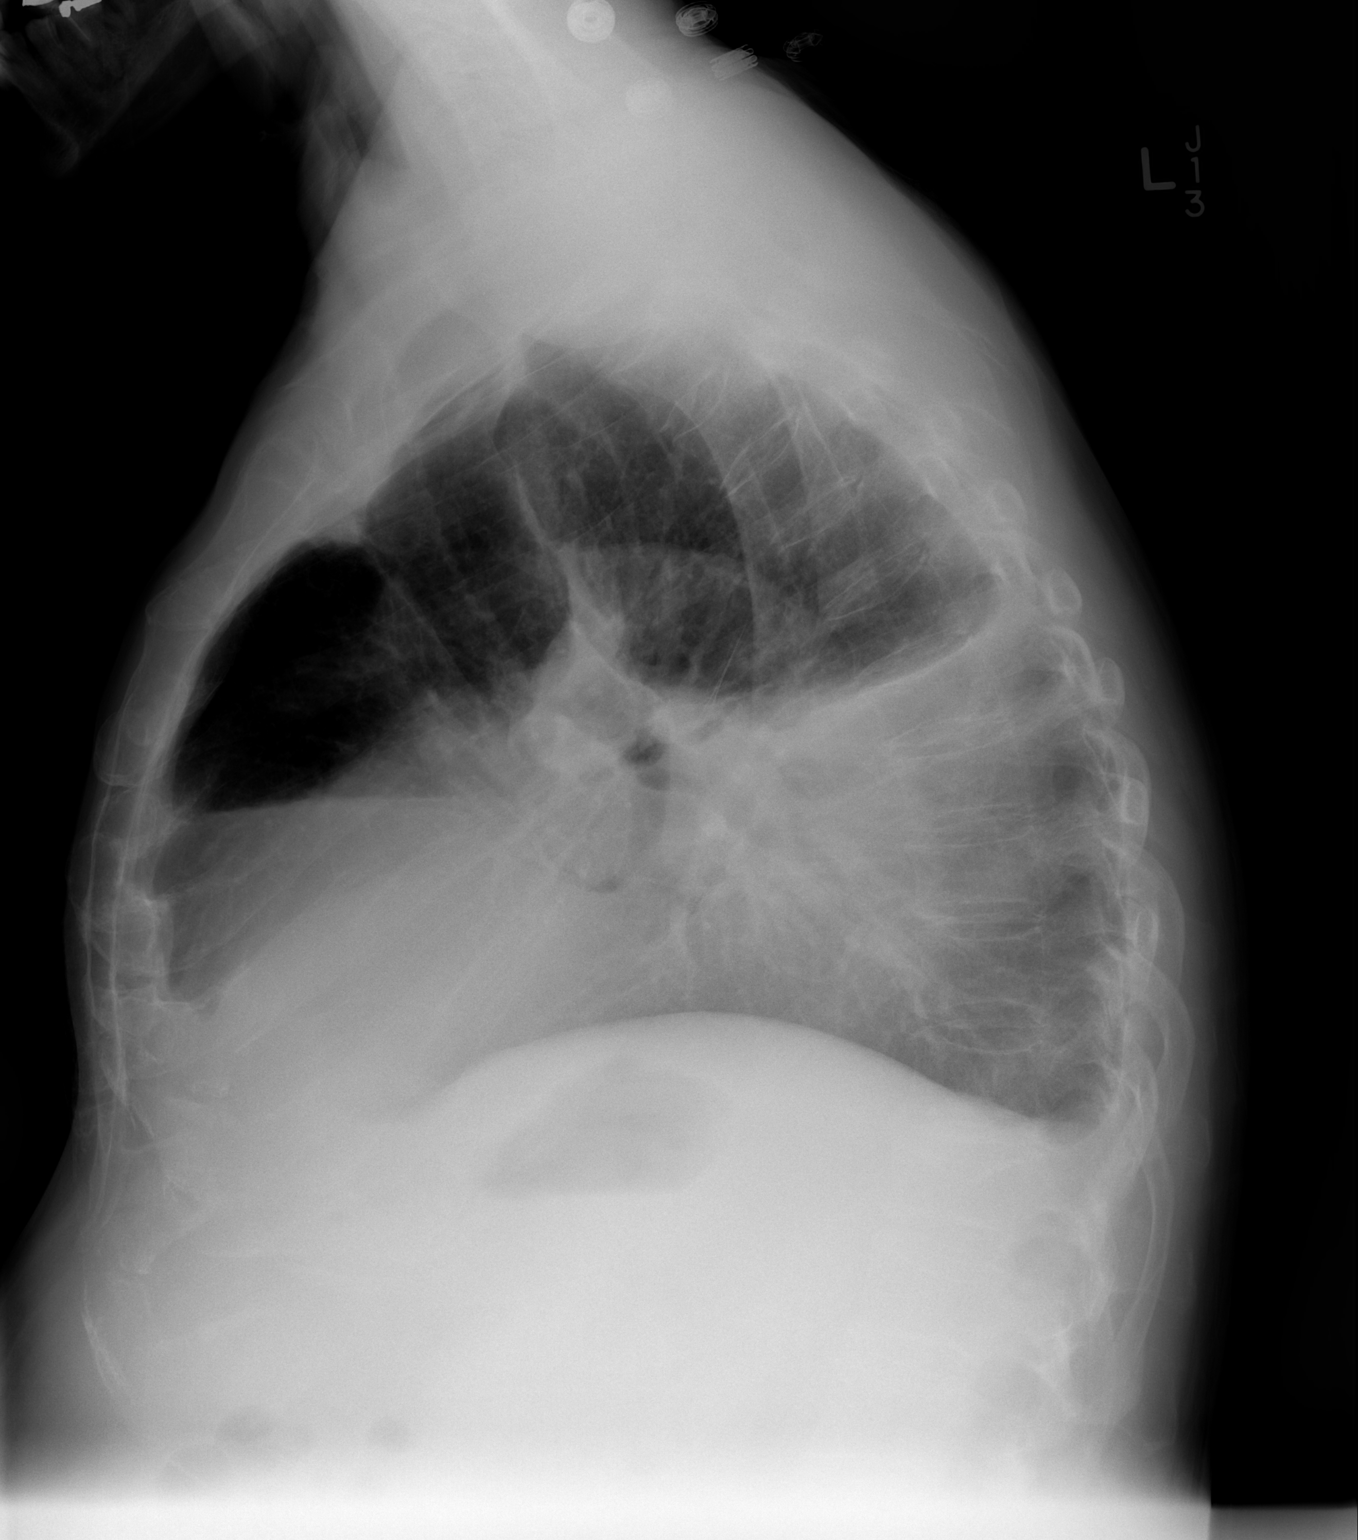

[2 of 2 positions shown; findings below may reference images not displayed]

FINDINGS: There is a large right pleural effusion unchanged from the prior
exam. There is no left pleural effusion. There is no focal
parenchymal opacity. There is no pneumothorax. The heart and
mediastinal contours are unremarkable.

The osseous structures are unremarkable.
IMPRESSION: Large right pleural effusion unchanged from 09/27/2013.

## 2015-09-14 IMAGING — CR DG ABD PORTABLE 1V
1 series · 1 of 1 positions shown · non-contrast
Comparison: None.

CLINICAL DATA: NG tube placement.

EXAM:
PORTABLE ABDOMEN - 1 VIEW

[AP]
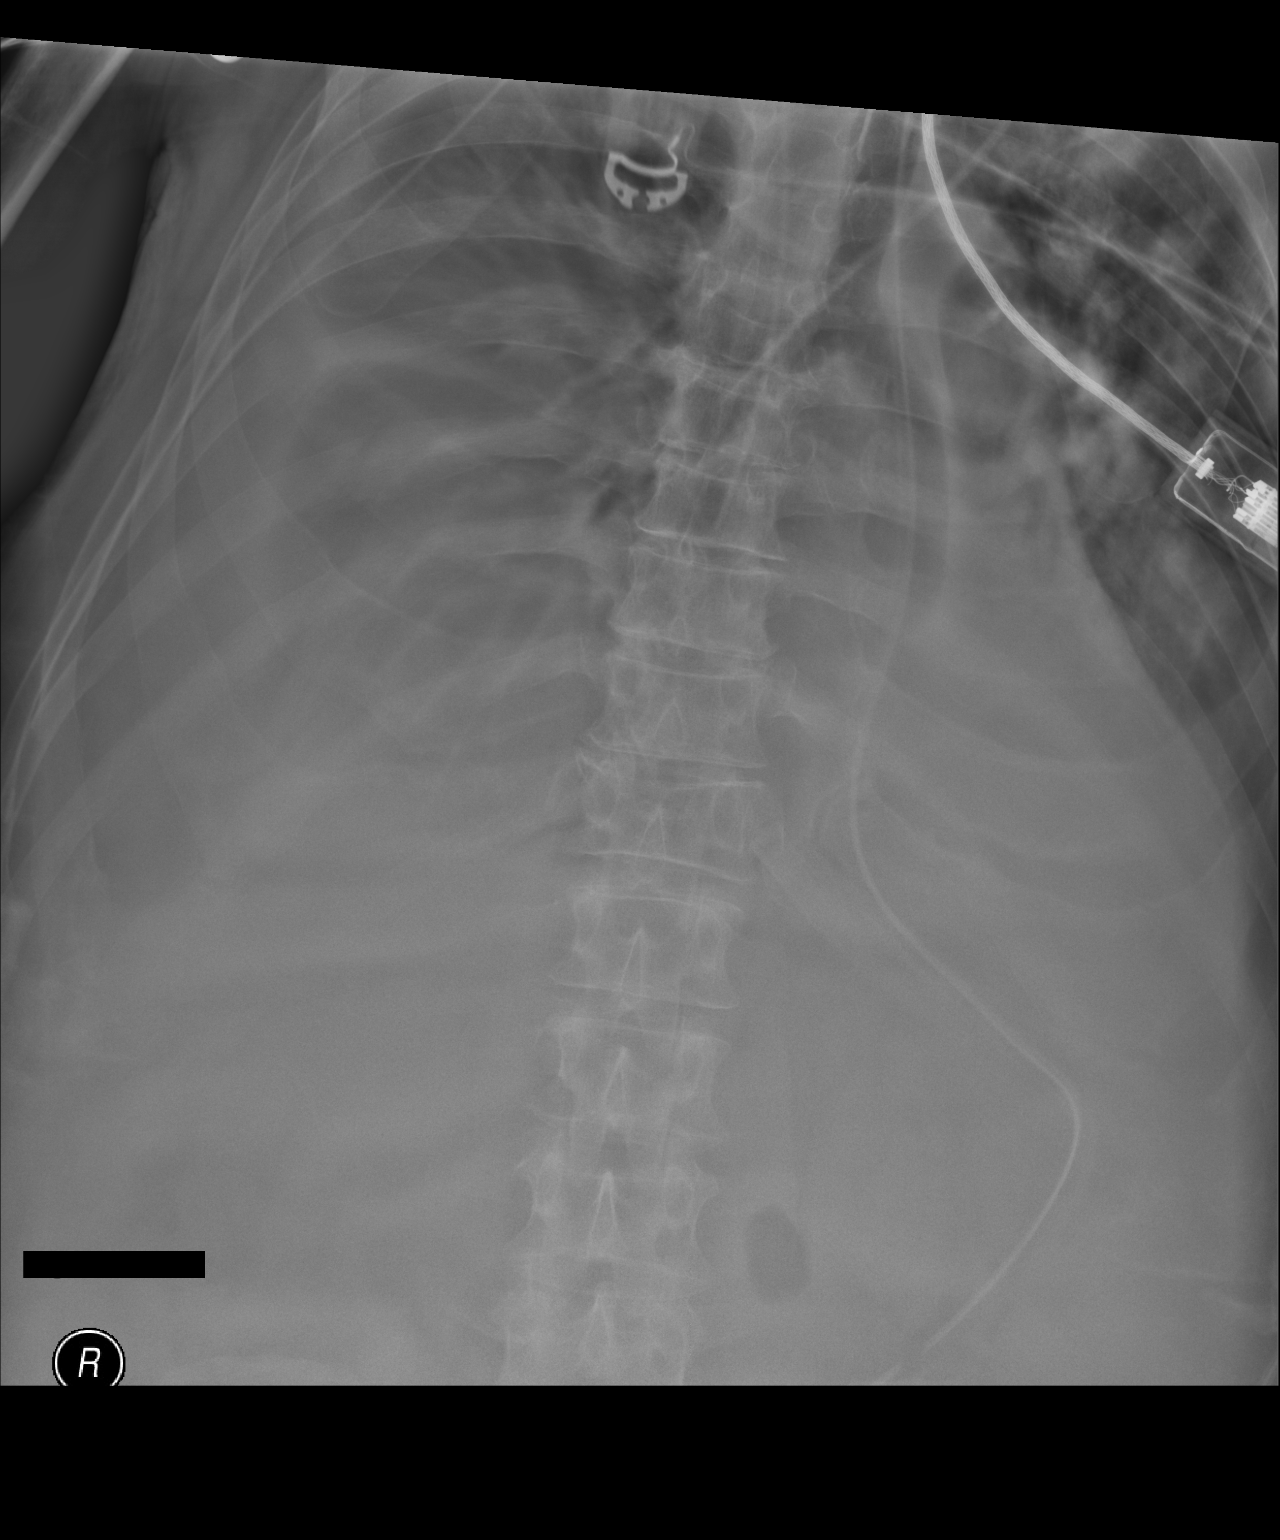

[1 of 1 positions shown; findings below may reference images not displayed]

FINDINGS: NG tube tip is in the distal stomach.
IMPRESSION: As above.
